# Patient Record
Sex: Female | Born: 1935 | Race: White | Hispanic: No | Marital: Married | Smoking: Former smoker
Health system: Southern US, Community
[De-identification: ages and names within clinical notes are randomized; demographics above are authoritative.]

## PROBLEM LIST (undated history)

## (undated) DIAGNOSIS — T4145XA Adverse effect of unspecified anesthetic, initial encounter: Secondary | ICD-10-CM

## (undated) DIAGNOSIS — Z7901 Long term (current) use of anticoagulants: Secondary | ICD-10-CM

## (undated) DIAGNOSIS — G629 Polyneuropathy, unspecified: Secondary | ICD-10-CM

## (undated) DIAGNOSIS — I10 Essential (primary) hypertension: Secondary | ICD-10-CM

## (undated) DIAGNOSIS — R270 Ataxia, unspecified: Secondary | ICD-10-CM

## (undated) DIAGNOSIS — H9190 Unspecified hearing loss, unspecified ear: Secondary | ICD-10-CM

## (undated) DIAGNOSIS — N181 Chronic kidney disease, stage 1: Secondary | ICD-10-CM

## (undated) DIAGNOSIS — IMO0001 Reserved for inherently not codable concepts without codable children: Secondary | ICD-10-CM

## (undated) DIAGNOSIS — C50919 Malignant neoplasm of unspecified site of unspecified female breast: Secondary | ICD-10-CM

## (undated) DIAGNOSIS — R112 Nausea with vomiting, unspecified: Secondary | ICD-10-CM

## (undated) DIAGNOSIS — Z9889 Other specified postprocedural states: Secondary | ICD-10-CM

## (undated) DIAGNOSIS — M1990 Unspecified osteoarthritis, unspecified site: Secondary | ICD-10-CM

## (undated) DIAGNOSIS — H3589 Other specified retinal disorders: Secondary | ICD-10-CM

## (undated) DIAGNOSIS — I495 Sick sinus syndrome: Secondary | ICD-10-CM

## (undated) DIAGNOSIS — I4821 Permanent atrial fibrillation: Secondary | ICD-10-CM

## (undated) DIAGNOSIS — Z8679 Personal history of other diseases of the circulatory system: Secondary | ICD-10-CM

## (undated) DIAGNOSIS — T8859XA Other complications of anesthesia, initial encounter: Secondary | ICD-10-CM

## (undated) DIAGNOSIS — H353190 Nonexudative age-related macular degeneration, unspecified eye, stage unspecified: Secondary | ICD-10-CM

## (undated) HISTORY — DX: Other specified retinal disorders: H35.89

## (undated) HISTORY — DX: Unspecified hearing loss, unspecified ear: H91.90

## (undated) HISTORY — DX: Chronic kidney disease, stage 1: N18.1

## (undated) HISTORY — PX: MIDDLE EAR SURGERY: SHX713

## (undated) HISTORY — DX: Reserved for inherently not codable concepts without codable children: IMO0001

## (undated) HISTORY — DX: Personal history of other diseases of the circulatory system: Z86.79

## (undated) HISTORY — PX: DILATION AND CURETTAGE OF UTERUS: SHX78

## (undated) HISTORY — DX: Nonexudative age-related macular degeneration, unspecified eye, stage unspecified: H35.3190

## (undated) HISTORY — DX: Unspecified osteoarthritis, unspecified site: M19.90

## (undated) HISTORY — DX: Permanent atrial fibrillation: I48.21

## (undated) HISTORY — DX: Malignant neoplasm of unspecified site of unspecified female breast: C50.919

## (undated) HISTORY — DX: Essential (primary) hypertension: I10

## (undated) HISTORY — PX: TONSILLECTOMY: SUR1361

## (undated) HISTORY — DX: Sick sinus syndrome: I49.5

## (undated) HISTORY — PX: APPENDECTOMY: SHX54

## (undated) HISTORY — DX: Long term (current) use of anticoagulants: Z79.01

## (undated) HISTORY — PX: PORT-A-CATH REMOVAL: SHX5289

---

## 1999-01-01 ENCOUNTER — Other Ambulatory Visit: Admission: RE | Admit: 1999-01-01 | Discharge: 1999-01-01 | Payer: Self-pay | Admitting: Obstetrics and Gynecology

## 1999-11-25 DIAGNOSIS — Z8679 Personal history of other diseases of the circulatory system: Secondary | ICD-10-CM

## 1999-11-25 HISTORY — DX: Personal history of other diseases of the circulatory system: Z86.79

## 2000-03-17 ENCOUNTER — Other Ambulatory Visit: Admission: RE | Admit: 2000-03-17 | Discharge: 2000-03-17 | Payer: Self-pay | Admitting: Obstetrics and Gynecology

## 2000-03-18 ENCOUNTER — Ambulatory Visit (HOSPITAL_COMMUNITY): Admission: RE | Admit: 2000-03-18 | Discharge: 2000-03-18 | Payer: Self-pay | Admitting: *Deleted

## 2001-03-22 ENCOUNTER — Other Ambulatory Visit: Admission: RE | Admit: 2001-03-22 | Discharge: 2001-03-22 | Payer: Self-pay | Admitting: Obstetrics and Gynecology

## 2002-03-11 ENCOUNTER — Ambulatory Visit (HOSPITAL_COMMUNITY): Admission: RE | Admit: 2002-03-11 | Discharge: 2002-03-11 | Payer: Self-pay | Admitting: Specialist

## 2002-03-11 ENCOUNTER — Encounter: Payer: Self-pay | Admitting: Specialist

## 2002-12-13 ENCOUNTER — Encounter: Payer: Self-pay | Admitting: Otolaryngology

## 2002-12-13 ENCOUNTER — Ambulatory Visit (HOSPITAL_COMMUNITY): Admission: RE | Admit: 2002-12-13 | Discharge: 2002-12-13 | Payer: Self-pay | Admitting: Otolaryngology

## 2003-09-12 ENCOUNTER — Ambulatory Visit (HOSPITAL_COMMUNITY): Admission: RE | Admit: 2003-09-12 | Discharge: 2003-09-12 | Payer: Self-pay | Admitting: Internal Medicine

## 2003-12-04 ENCOUNTER — Other Ambulatory Visit: Admission: RE | Admit: 2003-12-04 | Discharge: 2003-12-04 | Payer: Self-pay | Admitting: Obstetrics and Gynecology

## 2004-09-16 ENCOUNTER — Ambulatory Visit (HOSPITAL_COMMUNITY): Admission: RE | Admit: 2004-09-16 | Discharge: 2004-09-16 | Payer: Self-pay | Admitting: Specialist

## 2005-02-03 ENCOUNTER — Ambulatory Visit (HOSPITAL_COMMUNITY): Admission: RE | Admit: 2005-02-03 | Discharge: 2005-02-03 | Payer: Self-pay | Admitting: Specialist

## 2005-04-29 ENCOUNTER — Ambulatory Visit: Payer: Self-pay | Admitting: Cardiology

## 2005-05-16 ENCOUNTER — Ambulatory Visit: Payer: Self-pay | Admitting: *Deleted

## 2005-05-23 ENCOUNTER — Ambulatory Visit: Payer: Self-pay | Admitting: Cardiovascular Disease

## 2005-05-29 ENCOUNTER — Ambulatory Visit: Payer: Self-pay | Admitting: *Deleted

## 2005-06-02 ENCOUNTER — Ambulatory Visit: Payer: Self-pay | Admitting: Cardiology

## 2005-06-10 ENCOUNTER — Ambulatory Visit: Payer: Self-pay | Admitting: *Deleted

## 2005-06-16 ENCOUNTER — Ambulatory Visit: Payer: Self-pay | Admitting: Cardiology

## 2005-07-07 ENCOUNTER — Observation Stay (HOSPITAL_COMMUNITY): Admission: RE | Admit: 2005-07-07 | Discharge: 2005-07-07 | Payer: Self-pay | Admitting: Cardiology

## 2005-07-07 ENCOUNTER — Ambulatory Visit: Payer: Self-pay | Admitting: Cardiology

## 2005-07-11 ENCOUNTER — Ambulatory Visit: Payer: Self-pay | Admitting: Cardiology

## 2005-08-07 ENCOUNTER — Ambulatory Visit: Payer: Self-pay | Admitting: Cardiology

## 2005-09-10 ENCOUNTER — Ambulatory Visit: Payer: Self-pay | Admitting: *Deleted

## 2005-10-13 ENCOUNTER — Ambulatory Visit: Payer: Self-pay | Admitting: Cardiology

## 2005-11-11 ENCOUNTER — Ambulatory Visit: Payer: Self-pay | Admitting: Cardiology

## 2005-12-18 ENCOUNTER — Ambulatory Visit: Payer: Self-pay | Admitting: Cardiology

## 2006-01-09 ENCOUNTER — Ambulatory Visit: Payer: Self-pay | Admitting: Cardiology

## 2006-01-29 ENCOUNTER — Ambulatory Visit: Payer: Self-pay | Admitting: Cardiology

## 2006-02-04 ENCOUNTER — Ambulatory Visit: Payer: Self-pay | Admitting: Cardiology

## 2006-02-18 ENCOUNTER — Ambulatory Visit: Payer: Self-pay | Admitting: Cardiology

## 2006-02-18 ENCOUNTER — Observation Stay (HOSPITAL_COMMUNITY): Admission: AD | Admit: 2006-02-18 | Discharge: 2006-02-18 | Payer: Self-pay | Admitting: Cardiology

## 2006-02-20 ENCOUNTER — Ambulatory Visit: Payer: Self-pay | Admitting: Cardiology

## 2006-02-26 ENCOUNTER — Ambulatory Visit: Payer: Self-pay | Admitting: *Deleted

## 2006-03-05 ENCOUNTER — Ambulatory Visit: Payer: Self-pay | Admitting: Cardiology

## 2006-03-19 ENCOUNTER — Ambulatory Visit: Payer: Self-pay | Admitting: Internal Medicine

## 2006-03-20 ENCOUNTER — Ambulatory Visit (HOSPITAL_COMMUNITY): Admission: RE | Admit: 2006-03-20 | Discharge: 2006-03-20 | Payer: Self-pay | Admitting: Internal Medicine

## 2006-04-06 ENCOUNTER — Ambulatory Visit: Payer: Self-pay | Admitting: Cardiology

## 2006-05-07 ENCOUNTER — Ambulatory Visit: Payer: Self-pay | Admitting: *Deleted

## 2006-05-19 ENCOUNTER — Ambulatory Visit: Payer: Self-pay | Admitting: Cardiology

## 2006-06-12 ENCOUNTER — Ambulatory Visit: Payer: Self-pay | Admitting: *Deleted

## 2006-07-02 ENCOUNTER — Ambulatory Visit: Payer: Self-pay | Admitting: Cardiology

## 2006-08-06 ENCOUNTER — Ambulatory Visit: Payer: Self-pay | Admitting: Cardiology

## 2006-09-08 ENCOUNTER — Ambulatory Visit: Payer: Self-pay | Admitting: Cardiology

## 2006-09-17 ENCOUNTER — Ambulatory Visit: Payer: Self-pay | Admitting: Cardiology

## 2006-10-26 ENCOUNTER — Ambulatory Visit: Payer: Self-pay | Admitting: Cardiology

## 2006-12-07 ENCOUNTER — Ambulatory Visit: Payer: Self-pay | Admitting: Cardiovascular Disease

## 2007-01-06 ENCOUNTER — Ambulatory Visit: Payer: Self-pay | Admitting: Cardiology

## 2007-01-29 ENCOUNTER — Ambulatory Visit: Payer: Self-pay | Admitting: Cardiology

## 2007-02-15 ENCOUNTER — Ambulatory Visit: Payer: Self-pay | Admitting: *Deleted

## 2007-02-18 ENCOUNTER — Ambulatory Visit (HOSPITAL_COMMUNITY): Admission: RE | Admit: 2007-02-18 | Discharge: 2007-02-18 | Payer: Self-pay | Admitting: *Deleted

## 2007-02-26 ENCOUNTER — Ambulatory Visit: Payer: Self-pay | Admitting: Cardiology

## 2007-03-17 ENCOUNTER — Ambulatory Visit: Payer: Self-pay | Admitting: Cardiology

## 2007-04-13 ENCOUNTER — Ambulatory Visit: Payer: Self-pay | Admitting: Cardiology

## 2007-04-27 ENCOUNTER — Ambulatory Visit: Payer: Self-pay | Admitting: Cardiology

## 2007-06-07 ENCOUNTER — Ambulatory Visit: Payer: Self-pay | Admitting: Cardiology

## 2007-06-07 ENCOUNTER — Ambulatory Visit (HOSPITAL_COMMUNITY): Admission: RE | Admit: 2007-06-07 | Discharge: 2007-06-07 | Payer: Self-pay | Admitting: Cardiology

## 2007-06-15 ENCOUNTER — Ambulatory Visit: Payer: Self-pay | Admitting: Cardiology

## 2007-06-21 ENCOUNTER — Ambulatory Visit: Payer: Self-pay | Admitting: Cardiology

## 2007-06-25 ENCOUNTER — Ambulatory Visit (HOSPITAL_COMMUNITY): Admission: RE | Admit: 2007-06-25 | Discharge: 2007-06-25 | Payer: Self-pay | Admitting: Pulmonary Disease

## 2007-07-20 ENCOUNTER — Ambulatory Visit: Payer: Self-pay | Admitting: Cardiology

## 2007-08-03 ENCOUNTER — Ambulatory Visit: Payer: Self-pay | Admitting: Cardiology

## 2007-09-20 ENCOUNTER — Ambulatory Visit: Payer: Self-pay | Admitting: Internal Medicine

## 2007-10-18 ENCOUNTER — Ambulatory Visit: Payer: Self-pay | Admitting: Cardiology

## 2007-10-25 ENCOUNTER — Ambulatory Visit (HOSPITAL_COMMUNITY): Admission: RE | Admit: 2007-10-25 | Discharge: 2007-10-25 | Payer: Self-pay | Admitting: Cardiology

## 2007-10-26 ENCOUNTER — Ambulatory Visit: Payer: Self-pay | Admitting: Cardiology

## 2007-11-25 DIAGNOSIS — C50919 Malignant neoplasm of unspecified site of unspecified female breast: Secondary | ICD-10-CM

## 2007-11-25 HISTORY — PX: MICRODISCECTOMY LUMBAR: SUR864

## 2007-11-25 HISTORY — PX: MASTECTOMY PARTIAL / LUMPECTOMY W/ AXILLARY LYMPHADENECTOMY: SUR852

## 2007-11-25 HISTORY — DX: Malignant neoplasm of unspecified site of unspecified female breast: C50.919

## 2007-12-10 ENCOUNTER — Ambulatory Visit: Payer: Self-pay | Admitting: Internal Medicine

## 2008-01-06 ENCOUNTER — Ambulatory Visit: Payer: Self-pay | Admitting: Cardiology

## 2008-02-02 ENCOUNTER — Ambulatory Visit: Payer: Self-pay | Admitting: Cardiology

## 2008-03-02 ENCOUNTER — Ambulatory Visit: Payer: Self-pay | Admitting: Cardiology

## 2008-04-21 ENCOUNTER — Ambulatory Visit: Payer: Self-pay | Admitting: Cardiology

## 2008-04-25 ENCOUNTER — Ambulatory Visit (HOSPITAL_COMMUNITY): Admission: RE | Admit: 2008-04-25 | Discharge: 2008-04-25 | Payer: Self-pay | Admitting: Obstetrics and Gynecology

## 2008-04-28 ENCOUNTER — Encounter (HOSPITAL_COMMUNITY): Admission: RE | Admit: 2008-04-28 | Discharge: 2008-05-28 | Payer: Self-pay | Admitting: Oncology

## 2008-04-28 ENCOUNTER — Ambulatory Visit (HOSPITAL_COMMUNITY): Payer: Self-pay | Admitting: Oncology

## 2008-05-02 ENCOUNTER — Ambulatory Visit (HOSPITAL_COMMUNITY): Admission: RE | Admit: 2008-05-02 | Discharge: 2008-05-02 | Payer: Self-pay | Admitting: Oncology

## 2008-05-05 ENCOUNTER — Ambulatory Visit (HOSPITAL_COMMUNITY): Admission: RE | Admit: 2008-05-05 | Discharge: 2008-05-05 | Payer: Self-pay | Admitting: Radiology

## 2008-05-17 ENCOUNTER — Ambulatory Visit (HOSPITAL_COMMUNITY): Admission: RE | Admit: 2008-05-17 | Discharge: 2008-05-17 | Payer: Self-pay | Admitting: Oncology

## 2008-05-17 ENCOUNTER — Encounter (HOSPITAL_COMMUNITY): Payer: Self-pay | Admitting: Oncology

## 2008-05-17 ENCOUNTER — Ambulatory Visit: Payer: Self-pay | Admitting: Cardiology

## 2008-05-19 ENCOUNTER — Ambulatory Visit: Payer: Self-pay | Admitting: Cardiovascular Disease

## 2008-05-22 ENCOUNTER — Ambulatory Visit (HOSPITAL_COMMUNITY): Admission: RE | Admit: 2008-05-22 | Discharge: 2008-05-22 | Payer: Self-pay | Admitting: Surgery

## 2008-05-22 ENCOUNTER — Ambulatory Visit: Payer: Self-pay | Admitting: Internal Medicine

## 2008-05-23 ENCOUNTER — Ambulatory Visit: Admission: RE | Admit: 2008-05-23 | Discharge: 2008-07-19 | Payer: Self-pay | Admitting: Radiation Oncology

## 2008-05-25 ENCOUNTER — Ambulatory Visit: Payer: Self-pay | Admitting: Cardiology

## 2008-06-01 ENCOUNTER — Ambulatory Visit: Payer: Self-pay | Admitting: Internal Medicine

## 2008-06-05 ENCOUNTER — Ambulatory Visit: Payer: Self-pay | Admitting: Cardiology

## 2008-06-07 ENCOUNTER — Ambulatory Visit: Payer: Self-pay | Admitting: Hematology

## 2008-06-12 ENCOUNTER — Ambulatory Visit: Payer: Self-pay | Admitting: Cardiology

## 2008-06-13 ENCOUNTER — Encounter (HOSPITAL_COMMUNITY): Admission: RE | Admit: 2008-06-13 | Discharge: 2008-07-13 | Payer: Self-pay | Admitting: Oncology

## 2008-06-13 ENCOUNTER — Ambulatory Visit (HOSPITAL_COMMUNITY): Payer: Self-pay | Admitting: Oncology

## 2008-06-19 ENCOUNTER — Ambulatory Visit: Payer: Self-pay | Admitting: Cardiology

## 2008-06-26 ENCOUNTER — Ambulatory Visit: Payer: Self-pay | Admitting: Cardiology

## 2008-06-30 ENCOUNTER — Ambulatory Visit (HOSPITAL_COMMUNITY): Admission: RE | Admit: 2008-06-30 | Discharge: 2008-06-30 | Payer: Self-pay | Admitting: Orthopaedic Surgery

## 2008-06-30 ENCOUNTER — Ambulatory Visit: Payer: Self-pay | Admitting: Cardiology

## 2008-07-07 ENCOUNTER — Ambulatory Visit: Payer: Self-pay | Admitting: Cardiology

## 2008-07-10 ENCOUNTER — Ambulatory Visit: Payer: Self-pay | Admitting: Cardiology

## 2008-07-14 ENCOUNTER — Encounter (HOSPITAL_COMMUNITY): Admission: RE | Admit: 2008-07-14 | Discharge: 2008-08-13 | Payer: Self-pay | Admitting: Oncology

## 2008-07-17 ENCOUNTER — Encounter: Admission: RE | Admit: 2008-07-17 | Discharge: 2008-07-17 | Payer: Self-pay | Admitting: Neurosurgery

## 2008-07-17 ENCOUNTER — Ambulatory Visit (HOSPITAL_COMMUNITY): Payer: Self-pay | Admitting: Oncology

## 2008-07-21 ENCOUNTER — Ambulatory Visit: Payer: Self-pay | Admitting: Cardiology

## 2008-07-24 ENCOUNTER — Ambulatory Visit: Payer: Self-pay | Admitting: Cardiology

## 2008-08-08 ENCOUNTER — Ambulatory Visit: Payer: Self-pay | Admitting: Cardiology

## 2008-08-08 ENCOUNTER — Encounter (HOSPITAL_COMMUNITY): Payer: Self-pay | Admitting: Oncology

## 2008-08-10 ENCOUNTER — Inpatient Hospital Stay (HOSPITAL_COMMUNITY): Admission: RE | Admit: 2008-08-10 | Discharge: 2008-08-10 | Payer: Self-pay | Admitting: Neurosurgery

## 2008-08-14 ENCOUNTER — Encounter (HOSPITAL_COMMUNITY): Admission: RE | Admit: 2008-08-14 | Discharge: 2008-09-13 | Payer: Self-pay | Admitting: Oncology

## 2008-08-15 ENCOUNTER — Ambulatory Visit (HOSPITAL_COMMUNITY): Payer: Self-pay | Admitting: Oncology

## 2008-09-12 ENCOUNTER — Ambulatory Visit: Payer: Self-pay | Admitting: Oncology

## 2008-09-12 ENCOUNTER — Ambulatory Visit: Payer: Self-pay | Admitting: Cardiology

## 2008-09-12 ENCOUNTER — Inpatient Hospital Stay (HOSPITAL_COMMUNITY): Admission: AD | Admit: 2008-09-12 | Discharge: 2008-09-15 | Payer: Self-pay

## 2008-09-13 ENCOUNTER — Ambulatory Visit: Payer: Self-pay | Admitting: Oncology

## 2008-09-13 ENCOUNTER — Encounter (INDEPENDENT_AMBULATORY_CARE_PROVIDER_SITE_OTHER): Payer: Self-pay | Admitting: Family Medicine

## 2008-09-18 ENCOUNTER — Ambulatory Visit: Payer: Self-pay | Admitting: Cardiology

## 2008-09-19 ENCOUNTER — Encounter (HOSPITAL_COMMUNITY): Admission: RE | Admit: 2008-09-19 | Discharge: 2008-10-19 | Payer: Self-pay | Admitting: Oncology

## 2008-09-22 ENCOUNTER — Ambulatory Visit: Payer: Self-pay | Admitting: Cardiology

## 2008-10-03 ENCOUNTER — Ambulatory Visit (HOSPITAL_COMMUNITY): Payer: Self-pay | Admitting: Oncology

## 2008-10-09 ENCOUNTER — Encounter (HOSPITAL_COMMUNITY): Payer: Self-pay | Admitting: Oncology

## 2008-10-09 ENCOUNTER — Ambulatory Visit: Payer: Self-pay | Admitting: Cardiology

## 2008-10-23 ENCOUNTER — Ambulatory Visit: Payer: Self-pay | Admitting: Cardiology

## 2008-10-23 ENCOUNTER — Ambulatory Visit (HOSPITAL_COMMUNITY): Admission: RE | Admit: 2008-10-23 | Discharge: 2008-10-23 | Payer: Self-pay | Admitting: Cardiology

## 2008-10-24 ENCOUNTER — Encounter (HOSPITAL_COMMUNITY): Admission: RE | Admit: 2008-10-24 | Discharge: 2008-11-23 | Payer: Self-pay | Admitting: Oncology

## 2008-10-30 ENCOUNTER — Ambulatory Visit: Payer: Self-pay | Admitting: Cardiology

## 2008-11-06 ENCOUNTER — Ambulatory Visit: Payer: Self-pay | Admitting: Cardiology

## 2008-11-13 ENCOUNTER — Ambulatory Visit: Payer: Self-pay | Admitting: Cardiology

## 2008-11-15 ENCOUNTER — Inpatient Hospital Stay (HOSPITAL_COMMUNITY): Admission: AD | Admit: 2008-11-15 | Discharge: 2008-11-20 | Payer: Self-pay

## 2008-11-15 ENCOUNTER — Ambulatory Visit: Payer: Self-pay | Admitting: Cardiology

## 2008-11-23 ENCOUNTER — Ambulatory Visit: Payer: Self-pay | Admitting: Cardiology

## 2008-11-23 ENCOUNTER — Encounter (HOSPITAL_COMMUNITY): Payer: Self-pay | Admitting: Oncology

## 2008-11-27 ENCOUNTER — Ambulatory Visit (HOSPITAL_COMMUNITY): Payer: Self-pay | Admitting: Oncology

## 2008-11-27 ENCOUNTER — Encounter (HOSPITAL_COMMUNITY): Admission: RE | Admit: 2008-11-27 | Discharge: 2008-12-27 | Payer: Self-pay | Admitting: Oncology

## 2008-11-27 ENCOUNTER — Ambulatory Visit: Payer: Self-pay | Admitting: Cardiology

## 2008-11-29 ENCOUNTER — Ambulatory Visit: Payer: Self-pay | Admitting: Cardiology

## 2008-12-04 ENCOUNTER — Ambulatory Visit: Payer: Self-pay | Admitting: Cardiology

## 2008-12-12 ENCOUNTER — Ambulatory Visit: Payer: Self-pay | Admitting: Cardiology

## 2008-12-13 ENCOUNTER — Ambulatory Visit: Payer: Self-pay | Admitting: Cardiology

## 2008-12-14 ENCOUNTER — Ambulatory Visit (HOSPITAL_COMMUNITY): Admission: RE | Admit: 2008-12-14 | Discharge: 2008-12-15 | Payer: Self-pay | Admitting: Surgery

## 2008-12-14 ENCOUNTER — Encounter (INDEPENDENT_AMBULATORY_CARE_PROVIDER_SITE_OTHER): Payer: Self-pay | Admitting: Surgery

## 2008-12-18 ENCOUNTER — Ambulatory Visit: Payer: Self-pay | Admitting: Cardiology

## 2008-12-21 ENCOUNTER — Ambulatory Visit: Payer: Self-pay | Admitting: Cardiology

## 2008-12-28 ENCOUNTER — Ambulatory Visit: Payer: Self-pay | Admitting: Cardiology

## 2008-12-29 ENCOUNTER — Ambulatory Visit: Admission: RE | Admit: 2008-12-29 | Discharge: 2009-03-15 | Payer: Self-pay | Admitting: Radiation Oncology

## 2009-01-02 ENCOUNTER — Ambulatory Visit: Payer: Self-pay | Admitting: Cardiology

## 2009-01-15 ENCOUNTER — Ambulatory Visit: Payer: Self-pay | Admitting: Cardiology

## 2009-01-16 ENCOUNTER — Ambulatory Visit: Payer: Self-pay | Admitting: Cardiology

## 2009-01-16 ENCOUNTER — Ambulatory Visit (HOSPITAL_COMMUNITY): Admission: RE | Admit: 2009-01-16 | Discharge: 2009-01-16 | Payer: Self-pay | Admitting: Cardiology

## 2009-01-22 ENCOUNTER — Ambulatory Visit: Payer: Self-pay | Admitting: Cardiology

## 2009-01-31 ENCOUNTER — Ambulatory Visit: Payer: Self-pay | Admitting: Cardiology

## 2009-02-05 ENCOUNTER — Ambulatory Visit: Payer: Self-pay | Admitting: Cardiology

## 2009-02-12 ENCOUNTER — Encounter (HOSPITAL_COMMUNITY): Admission: RE | Admit: 2009-02-12 | Discharge: 2009-03-14 | Payer: Self-pay | Admitting: Oncology

## 2009-02-12 ENCOUNTER — Ambulatory Visit (HOSPITAL_COMMUNITY): Payer: Self-pay | Admitting: Oncology

## 2009-02-20 ENCOUNTER — Ambulatory Visit: Payer: Self-pay | Admitting: Cardiology

## 2009-02-20 ENCOUNTER — Encounter (HOSPITAL_COMMUNITY): Payer: Self-pay | Admitting: Oncology

## 2009-02-26 ENCOUNTER — Ambulatory Visit: Payer: Self-pay | Admitting: Cardiology

## 2009-03-08 IMAGING — CR DG CHEST 2V
1 series · 1 of 1 positions shown · non-contrast
Comparison: 05/22/2008.

CLINICAL DATA: Breast cancer.  Shortness breath.

CHEST - 2 VIEW

[view not recorded]
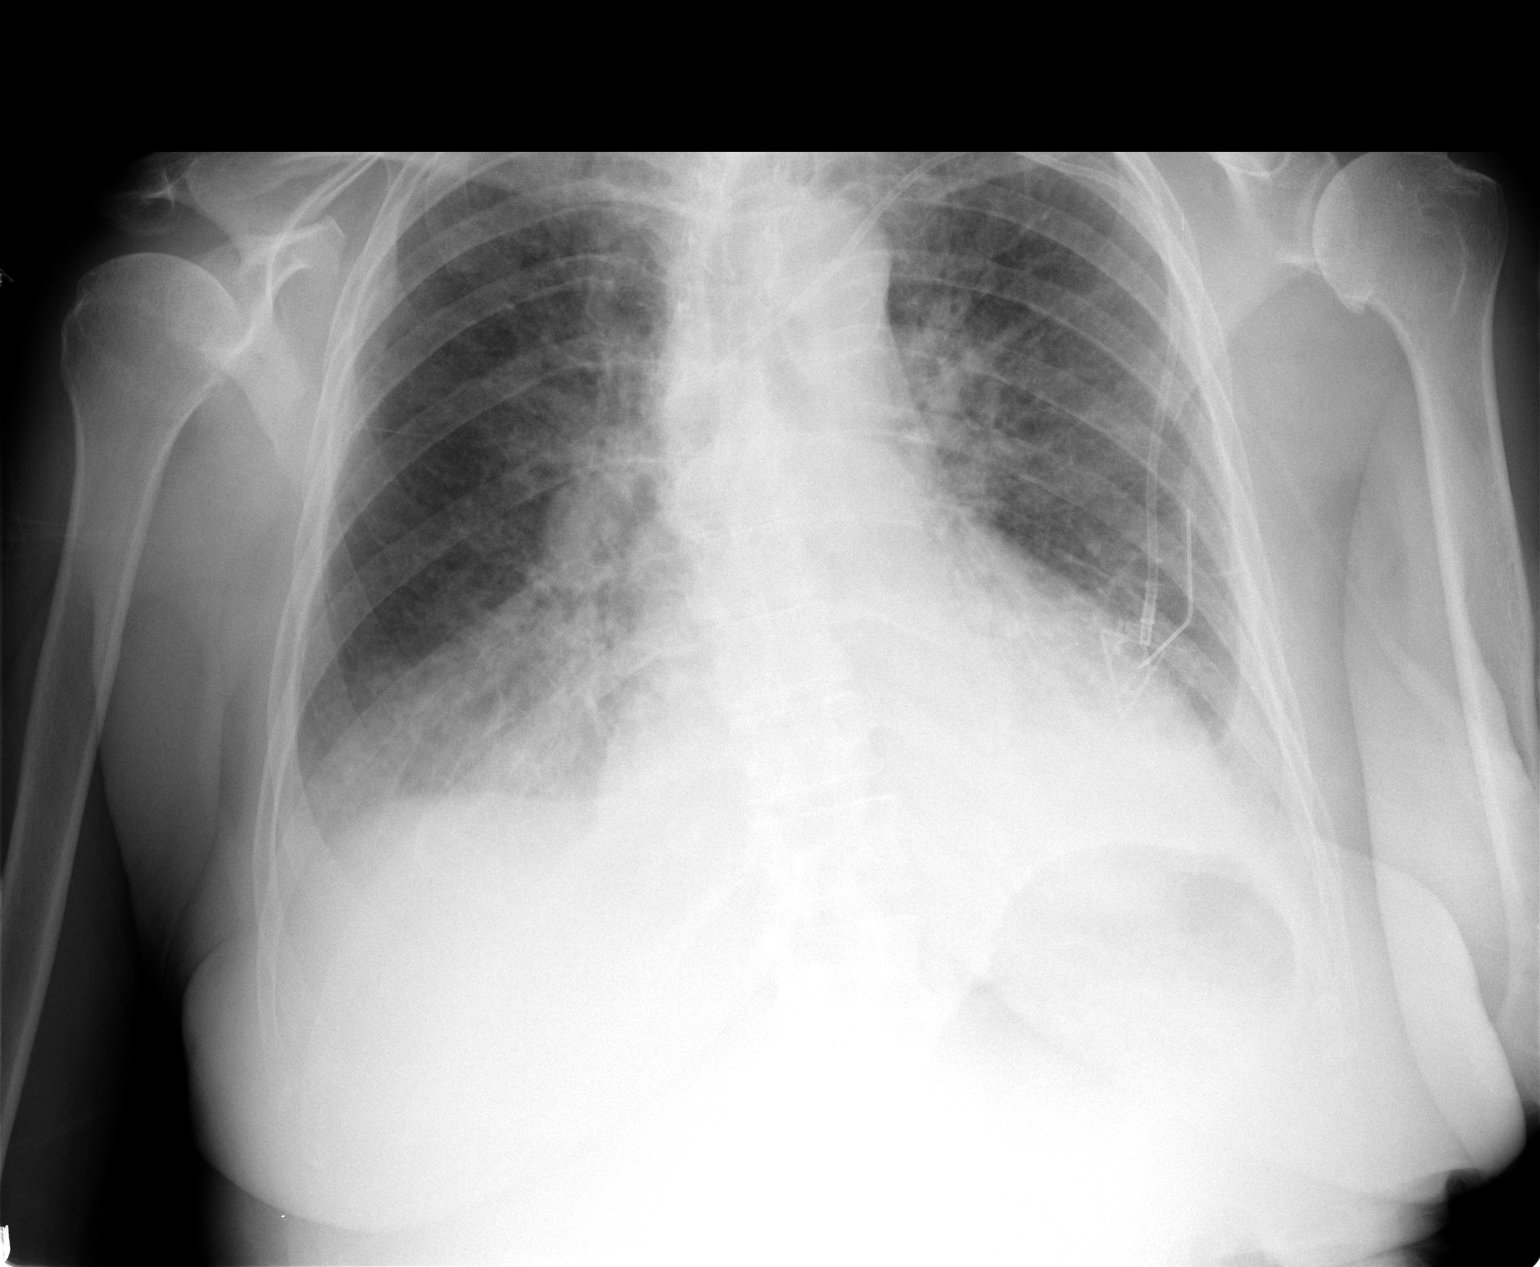

[1 of 1 positions shown; findings below may reference images not displayed]

FINDINGS: Cardiomegaly and pulmonary edema.  Pleural effusions may
be present.  Mediport catheter tip distal left subclavian vein at
the formation of the superior vena cava.  No pneumothorax.  No
segmental consolidation or plain film evidence pulmonary
malignancy.  The pulmonary edema limits evaluation for detection of
such.  Scoliosis and degenerative changes thoracic lumbar spine.
Calcified aorta.
IMPRESSION: Pulmonary edema with cardiomegaly and suggestion of
small bilateral pleural effusions.

## 2009-03-22 ENCOUNTER — Ambulatory Visit: Payer: Self-pay | Admitting: Cardiology

## 2009-04-26 ENCOUNTER — Ambulatory Visit: Payer: Self-pay | Admitting: Cardiology

## 2009-04-26 ENCOUNTER — Encounter (HOSPITAL_COMMUNITY): Admission: RE | Admit: 2009-04-26 | Discharge: 2009-05-26 | Payer: Self-pay | Admitting: Oncology

## 2009-04-27 ENCOUNTER — Ambulatory Visit (HOSPITAL_COMMUNITY): Payer: Self-pay | Admitting: Oncology

## 2009-05-15 IMAGING — CR DG CHEST 2V
2 series · 2 of 2 positions shown · non-contrast
Comparison: 11/15/2008

CLINICAL DATA: Neutropenia.  Fever.  Dehydration.

CHEST - 2 VIEW

[view not recorded (1 of 2)]
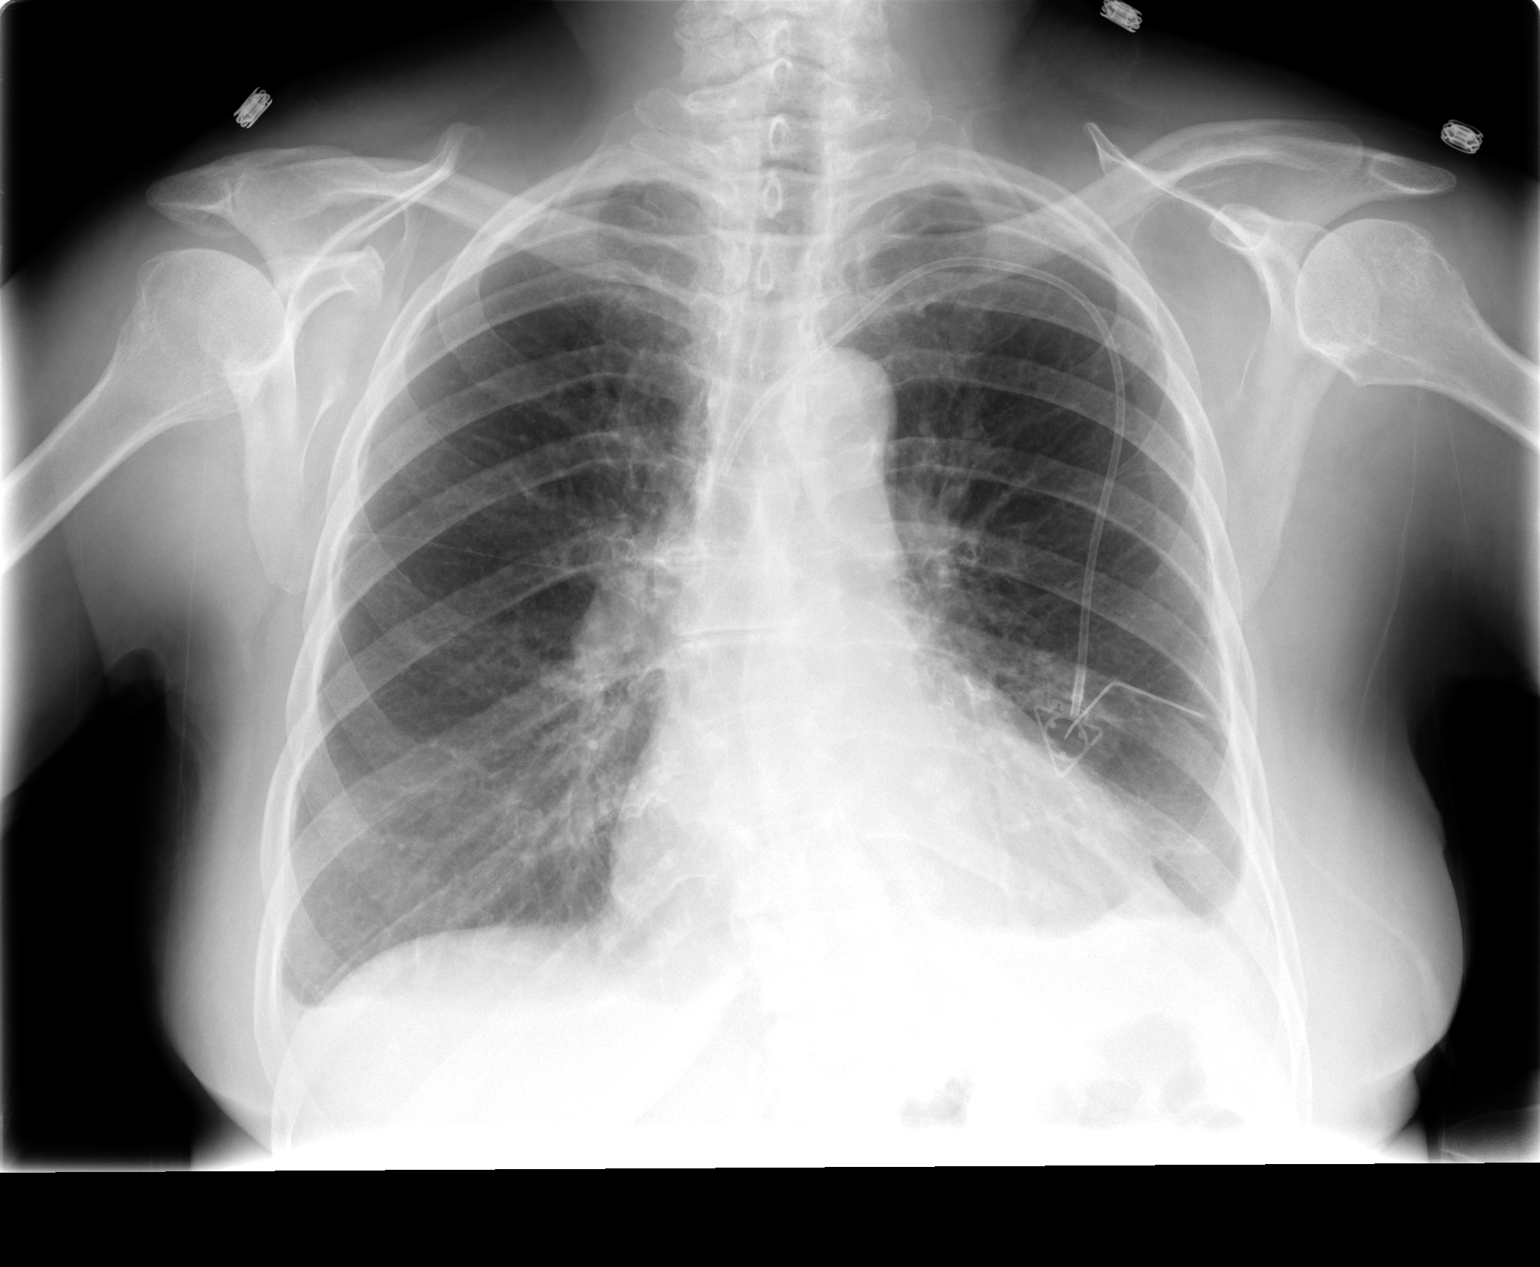

[view not recorded (2 of 2)]
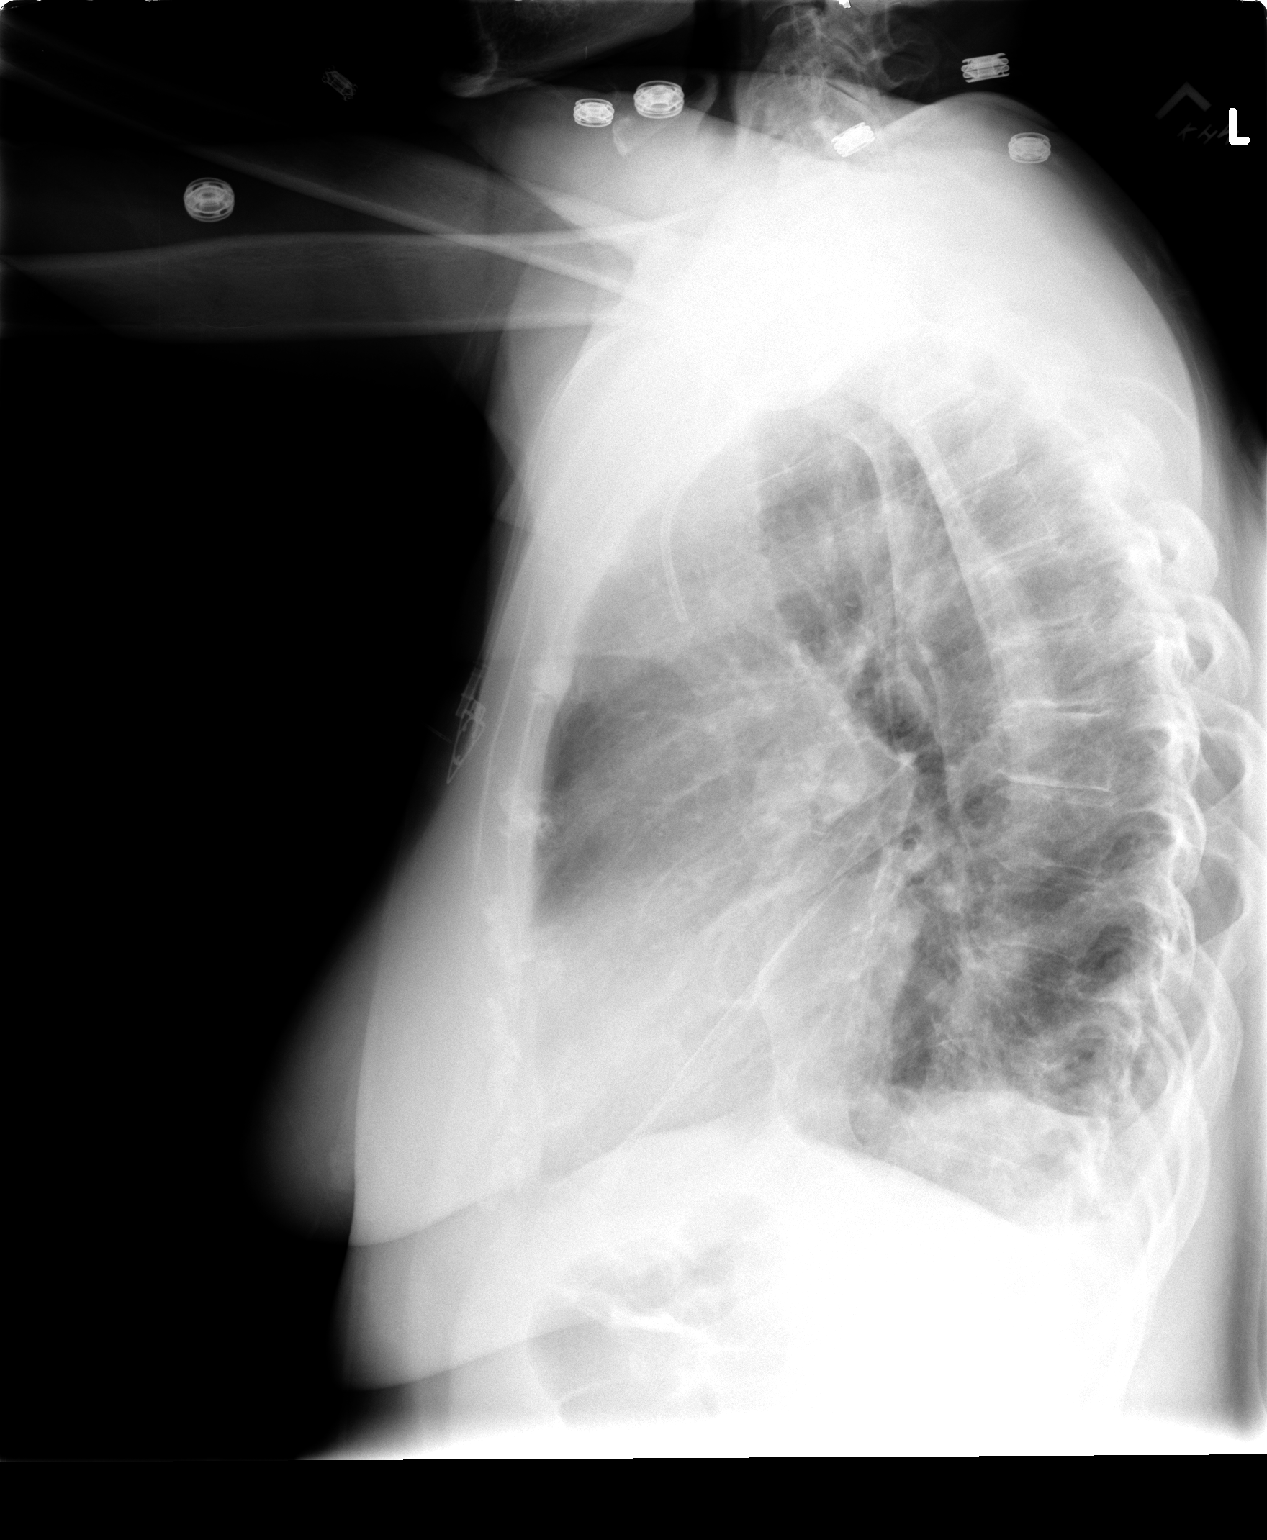

[2 of 2 positions shown; findings below may reference images not displayed]

FINDINGS: Small pleural effusions have developed.  No infiltrate.
Mild cardiomegaly.  Central venous catheter is in the upper SVC.
IMPRESSION: Small pleural effusions.

## 2009-06-01 DIAGNOSIS — I10 Essential (primary) hypertension: Secondary | ICD-10-CM | POA: Insufficient documentation

## 2009-06-01 DIAGNOSIS — H9190 Unspecified hearing loss, unspecified ear: Secondary | ICD-10-CM | POA: Insufficient documentation

## 2009-06-01 DIAGNOSIS — M25569 Pain in unspecified knee: Secondary | ICD-10-CM | POA: Insufficient documentation

## 2009-06-04 ENCOUNTER — Encounter: Payer: Self-pay | Admitting: Cardiology

## 2009-06-04 ENCOUNTER — Ambulatory Visit: Payer: Self-pay | Admitting: Cardiology

## 2009-06-20 ENCOUNTER — Encounter: Payer: Self-pay | Admitting: Cardiology

## 2009-06-20 ENCOUNTER — Ambulatory Visit (HOSPITAL_COMMUNITY): Payer: Self-pay | Admitting: Oncology

## 2009-06-20 LAB — CONVERTED CEMR LAB
Albumin: 4.1 g/dL (ref 3.5–5.2)
Alkaline Phosphatase: 85 units/L (ref 39–117)
Basophils Absolute: 0 10*3/uL (ref 0.0–0.1)
CO2: 25 meq/L (ref 19–32)
Calcium: 9.1 mg/dL (ref 8.4–10.5)
Chloride: 107 meq/L (ref 96–112)
Eosinophils Relative: 3 % (ref 0–5)
Glucose, Bld: 85 mg/dL (ref 70–99)
HCT: 35.7 % — ABNORMAL LOW (ref 36.0–46.0)
Hemoglobin: 11.8 g/dL — ABNORMAL LOW (ref 12.0–15.0)
Lymphs Abs: 0.7 10*3/uL (ref 0.7–4.0)
MCHC: 33.1 g/dL (ref 30.0–36.0)
Neutro Abs: 2.4 10*3/uL (ref 1.7–7.7)
Platelets: 159 10*3/uL (ref 150–400)
Potassium: 4 meq/L (ref 3.5–5.3)
Total Bilirubin: 0.4 mg/dL (ref 0.3–1.2)
WBC: 3.7 10*3/uL — ABNORMAL LOW (ref 4.0–10.5)

## 2009-06-21 ENCOUNTER — Ambulatory Visit: Payer: Self-pay | Admitting: Cardiology

## 2009-06-26 ENCOUNTER — Encounter: Payer: Self-pay | Admitting: Cardiology

## 2009-07-09 ENCOUNTER — Encounter: Payer: Self-pay | Admitting: *Deleted

## 2009-07-09 ENCOUNTER — Ambulatory Visit: Payer: Self-pay | Admitting: Cardiology

## 2009-07-09 LAB — CONVERTED CEMR LAB: POC INR: 1.5

## 2009-07-19 ENCOUNTER — Ambulatory Visit (HOSPITAL_BASED_OUTPATIENT_CLINIC_OR_DEPARTMENT_OTHER): Admission: RE | Admit: 2009-07-19 | Discharge: 2009-07-19 | Payer: Self-pay | Admitting: Surgery

## 2009-08-01 ENCOUNTER — Ambulatory Visit: Payer: Self-pay

## 2009-08-01 LAB — CONVERTED CEMR LAB: POC INR: 2.3

## 2009-08-21 ENCOUNTER — Encounter: Payer: Self-pay | Admitting: Cardiology

## 2009-08-28 ENCOUNTER — Encounter: Payer: Self-pay | Admitting: Cardiology

## 2009-08-29 ENCOUNTER — Encounter: Payer: Self-pay | Admitting: Cardiology

## 2009-08-29 ENCOUNTER — Telehealth: Payer: Self-pay | Admitting: Cardiology

## 2009-08-29 LAB — CONVERTED CEMR LAB: POC INR: 1.9

## 2009-09-21 ENCOUNTER — Encounter (INDEPENDENT_AMBULATORY_CARE_PROVIDER_SITE_OTHER): Payer: Self-pay | Admitting: *Deleted

## 2009-09-21 ENCOUNTER — Encounter: Payer: Self-pay | Admitting: Cardiology

## 2009-09-21 LAB — CONVERTED CEMR LAB
CO2: 27 meq/L
Chloride: 105 meq/L
Creatinine, Ser: 0.88 mg/dL
Glucose, Bld: 91 mg/dL (ref 70–99)
Potassium: 4.2 meq/L
Potassium: 4.2 meq/L (ref 3.5–5.3)
Sodium: 141 meq/L
Sodium: 141 meq/L (ref 135–145)

## 2009-09-24 ENCOUNTER — Encounter (INDEPENDENT_AMBULATORY_CARE_PROVIDER_SITE_OTHER): Payer: Self-pay | Admitting: *Deleted

## 2009-09-26 ENCOUNTER — Telehealth (INDEPENDENT_AMBULATORY_CARE_PROVIDER_SITE_OTHER): Payer: Self-pay

## 2009-09-26 ENCOUNTER — Ambulatory Visit: Payer: Self-pay | Admitting: Cardiology

## 2009-09-26 LAB — CONVERTED CEMR LAB: POC INR: 2.3

## 2009-09-27 ENCOUNTER — Encounter: Payer: Self-pay | Admitting: Cardiology

## 2009-10-05 ENCOUNTER — Encounter (INDEPENDENT_AMBULATORY_CARE_PROVIDER_SITE_OTHER): Payer: Self-pay | Admitting: *Deleted

## 2009-10-05 LAB — CONVERTED CEMR LAB
OCCULT 1: NEGATIVE
OCCULT 3: NEGATIVE

## 2009-10-17 ENCOUNTER — Encounter (HOSPITAL_COMMUNITY): Admission: RE | Admit: 2009-10-17 | Discharge: 2009-11-16 | Payer: Self-pay | Admitting: Oncology

## 2009-10-17 ENCOUNTER — Ambulatory Visit (HOSPITAL_COMMUNITY): Payer: Self-pay | Admitting: Oncology

## 2009-10-25 ENCOUNTER — Ambulatory Visit: Payer: Self-pay | Admitting: Cardiology

## 2009-11-05 ENCOUNTER — Encounter: Payer: Self-pay | Admitting: Cardiology

## 2009-11-22 ENCOUNTER — Ambulatory Visit: Payer: Self-pay | Admitting: Cardiology

## 2009-11-22 LAB — CONVERTED CEMR LAB: POC INR: 1.6

## 2009-12-20 ENCOUNTER — Encounter (INDEPENDENT_AMBULATORY_CARE_PROVIDER_SITE_OTHER): Payer: Self-pay | Admitting: *Deleted

## 2009-12-27 ENCOUNTER — Ambulatory Visit: Payer: Self-pay | Admitting: Cardiology

## 2009-12-27 ENCOUNTER — Encounter (INDEPENDENT_AMBULATORY_CARE_PROVIDER_SITE_OTHER): Payer: Self-pay | Admitting: *Deleted

## 2009-12-27 ENCOUNTER — Ambulatory Visit (HOSPITAL_COMMUNITY): Admission: RE | Admit: 2009-12-27 | Discharge: 2009-12-27 | Payer: Self-pay | Admitting: Cardiology

## 2009-12-27 LAB — CONVERTED CEMR LAB
ALT: 24 units/L
AST: 28 units/L
BUN: 20 mg/dL
Calcium: 8.9 mg/dL
Creatinine, Ser: 1.12 mg/dL
Potassium: 3.9 meq/L
Total Protein: 6 g/dL

## 2009-12-28 ENCOUNTER — Encounter (INDEPENDENT_AMBULATORY_CARE_PROVIDER_SITE_OTHER): Payer: Self-pay | Admitting: *Deleted

## 2009-12-28 LAB — CONVERTED CEMR LAB
Albumin: 3.9 g/dL (ref 3.5–5.2)
Alkaline Phosphatase: 69 units/L (ref 39–117)
BUN: 20 mg/dL (ref 6–23)
CO2: 26 meq/L (ref 19–32)
Calcium: 8.9 mg/dL (ref 8.4–10.5)
Chloride: 108 meq/L (ref 96–112)
Glucose, Bld: 102 mg/dL — ABNORMAL HIGH (ref 70–99)
Potassium: 3.9 meq/L (ref 3.5–5.3)
Sodium: 143 meq/L (ref 135–145)
Total Protein: 6 g/dL (ref 6.0–8.3)

## 2010-01-24 ENCOUNTER — Ambulatory Visit: Payer: Self-pay | Admitting: Cardiology

## 2010-01-24 LAB — CONVERTED CEMR LAB: POC INR: 2.4

## 2010-02-25 ENCOUNTER — Ambulatory Visit: Payer: Self-pay | Admitting: Cardiology

## 2010-02-25 LAB — CONVERTED CEMR LAB: POC INR: 1.8

## 2010-03-04 ENCOUNTER — Telehealth (INDEPENDENT_AMBULATORY_CARE_PROVIDER_SITE_OTHER): Payer: Self-pay

## 2010-03-11 ENCOUNTER — Ambulatory Visit: Payer: Self-pay | Admitting: Cardiovascular Disease

## 2010-03-22 ENCOUNTER — Telehealth: Payer: Self-pay | Admitting: Cardiology

## 2010-03-22 ENCOUNTER — Encounter: Payer: Self-pay | Admitting: Cardiology

## 2010-03-22 LAB — CONVERTED CEMR LAB: INR: 2.6

## 2010-04-10 ENCOUNTER — Ambulatory Visit: Payer: Self-pay | Admitting: Cardiology

## 2010-04-29 ENCOUNTER — Ambulatory Visit (HOSPITAL_COMMUNITY): Payer: Self-pay | Admitting: Oncology

## 2010-04-29 ENCOUNTER — Encounter (HOSPITAL_COMMUNITY)
Admission: RE | Admit: 2010-04-29 | Discharge: 2010-05-29 | Payer: Self-pay | Source: Home / Self Care | Admitting: Oncology

## 2010-04-29 ENCOUNTER — Ambulatory Visit: Payer: Self-pay | Admitting: Cardiology

## 2010-05-30 ENCOUNTER — Telehealth: Payer: Self-pay | Admitting: Cardiology

## 2010-05-30 ENCOUNTER — Encounter: Payer: Self-pay | Admitting: Cardiology

## 2010-05-30 LAB — CONVERTED CEMR LAB: POC INR: 2.8

## 2010-06-07 ENCOUNTER — Encounter (HOSPITAL_COMMUNITY): Admission: RE | Admit: 2010-06-07 | Discharge: 2010-07-07 | Payer: Self-pay | Admitting: Oncology

## 2010-06-13 ENCOUNTER — Ambulatory Visit: Payer: Self-pay | Admitting: Cardiology

## 2010-06-13 LAB — CONVERTED CEMR LAB: POC INR: 2.6

## 2010-06-14 ENCOUNTER — Encounter: Payer: Self-pay | Admitting: Cardiology

## 2010-07-02 ENCOUNTER — Encounter: Payer: Self-pay | Admitting: Cardiology

## 2010-07-10 ENCOUNTER — Ambulatory Visit: Payer: Self-pay | Admitting: Cardiology

## 2010-07-10 LAB — CONVERTED CEMR LAB: POC INR: 2.7

## 2010-07-19 ENCOUNTER — Encounter (INDEPENDENT_AMBULATORY_CARE_PROVIDER_SITE_OTHER): Payer: Self-pay | Admitting: *Deleted

## 2010-07-23 ENCOUNTER — Ambulatory Visit: Payer: Self-pay | Admitting: Cardiology

## 2010-07-24 ENCOUNTER — Encounter: Payer: Self-pay | Admitting: *Deleted

## 2010-07-24 LAB — CONVERTED CEMR LAB
ALT: 14 units/L (ref 0–35)
AST: 21 units/L (ref 0–37)
Alkaline Phosphatase: 62 units/L
Alkaline Phosphatase: 62 units/L (ref 39–117)
BUN: 19 mg/dL
Basophils Absolute: 0 10*3/uL
Basophils Absolute: 0 10*3/uL (ref 0.0–0.1)
Basophils Relative: 1 % (ref 0–1)
CO2: 29 meq/L
Creatinine, Ser: 0.88 mg/dL
Eosinophils Absolute: 0.1 10*3/uL (ref 0.0–0.7)
Eosinophils Relative: 1 %
Eosinophils Relative: 1 % (ref 0–5)
Glucose, Bld: 75 mg/dL
HCT: 38 % (ref 36.0–46.0)
Lymphocytes Relative: 21 %
Lymphs Abs: 0.8 10*3/uL
MCV: 107 fL
MCV: 107 fL — ABNORMAL HIGH (ref 78.0–100.0)
Monocytes Absolute: 0.3 10*3/uL
Neutrophils Relative %: 70 % (ref 43–77)
Platelets: 141 10*3/uL — ABNORMAL LOW (ref 150–400)
RBC: 3.55 M/uL
RDW: 13.4 % (ref 11.5–15.5)
Sodium: 144 meq/L (ref 135–145)
Total Bilirubin: 0.5 mg/dL (ref 0.3–1.2)
Total Protein: 5.7 g/dL — ABNORMAL LOW (ref 6.0–8.3)
WBC: 4 10*3/uL

## 2010-07-30 ENCOUNTER — Encounter (INDEPENDENT_AMBULATORY_CARE_PROVIDER_SITE_OTHER): Payer: Self-pay | Admitting: *Deleted

## 2010-07-30 LAB — CONVERTED CEMR LAB: OCCULT 2: NEGATIVE

## 2010-08-01 ENCOUNTER — Ambulatory Visit: Payer: Self-pay | Admitting: Cardiology

## 2010-08-02 ENCOUNTER — Encounter (INDEPENDENT_AMBULATORY_CARE_PROVIDER_SITE_OTHER): Payer: Self-pay | Admitting: *Deleted

## 2010-08-02 ENCOUNTER — Encounter (INDEPENDENT_AMBULATORY_CARE_PROVIDER_SITE_OTHER): Payer: Self-pay

## 2010-08-07 ENCOUNTER — Ambulatory Visit: Payer: Self-pay | Admitting: Cardiology

## 2010-09-12 ENCOUNTER — Ambulatory Visit: Payer: Self-pay | Admitting: Cardiology

## 2010-09-12 LAB — CONVERTED CEMR LAB: POC INR: 2.5

## 2010-10-10 ENCOUNTER — Ambulatory Visit: Payer: Self-pay

## 2010-10-10 LAB — CONVERTED CEMR LAB: POC INR: 2.8

## 2010-10-23 ENCOUNTER — Encounter (HOSPITAL_COMMUNITY)
Admission: RE | Admit: 2010-10-23 | Discharge: 2010-11-22 | Payer: Self-pay | Source: Home / Self Care | Attending: Oncology | Admitting: Oncology

## 2010-10-23 ENCOUNTER — Ambulatory Visit (HOSPITAL_COMMUNITY): Payer: Self-pay | Admitting: Oncology

## 2010-11-07 ENCOUNTER — Ambulatory Visit: Payer: Self-pay | Admitting: Cardiology

## 2010-11-07 LAB — CONVERTED CEMR LAB: POC INR: 2.9

## 2010-12-09 ENCOUNTER — Encounter: Payer: Self-pay | Admitting: Cardiology

## 2010-12-09 LAB — CONVERTED CEMR LAB: Prothrombin Time: 30.5 s

## 2010-12-11 ENCOUNTER — Encounter: Payer: Self-pay | Admitting: Cardiology

## 2010-12-12 ENCOUNTER — Encounter: Payer: Self-pay | Admitting: Cardiology

## 2010-12-15 ENCOUNTER — Encounter: Payer: Self-pay | Admitting: Neurosurgery

## 2010-12-15 ENCOUNTER — Encounter: Payer: Self-pay | Admitting: Radiology

## 2010-12-24 NOTE — Medication Information (Signed)
Summary: ccr-lr  Anticoagulant Therapy  Managed by: Vashti Hey, RN PCP: Dr. [place] Bing Supervising MD: Dietrich Pates MD, Molly Maduro Indication 1: Atrial Fibrillation Lab Used: [facility] [place] Site: [place] INR POC 1.8  Dietary changes: no    Health status changes: no    Bleeding/hemorrhagic complications: no    Recent/future hospitalizations: no    Any changes in medication regimen? no    Recent/future dental: no  Any missed doses?: no       Is patient compliant with meds? yes       Allergies: 1)  ! * Rampiril  Anticoagulation Management History:      The patient is taking warfarin and comes in today for a routine follow up visit.  Positive risk factors for bleeding include an age of 75 years or older.  The bleeding index is 'intermediate risk'.  Positive CHADS2 values include History of CHF, History of HTN, and Age > 56 years old.  The start date was 04/24/2005.  Anticoagulation responsible provider: Dietrich Pates MD, Molly Maduro.  INR POC: 1.8.  Cuvette Lot#: [id number].  Exp: 10/11.    Anticoagulation Management Assessment/Plan:      The patient's current anticoagulation dose is Coumadin 5 mg tabs: Marland Kitchen  The target INR is 2 - 3.  The next INR is due 03/11/2010.  Anticoagulation instructions were given to patient.  Results were reviewed/authorized by Vashti Hey, RN.  She was notified by Vashti Hey RN.         Prior Anticoagulation Instructions: INR 2.4 Continue coumadin 5mg  once daily except 7.5mg  on Sundays, Tuesdays and Thursdays  Current Anticoagulation Instructions: INR 1.8 Take coumadin 10mg  x 2 then resume 5mg  once daily except 7.5mg  on Sundays, Tuesdays and Thursdays

## 2010-12-24 NOTE — Medication Information (Signed)
Summary: ccr-lr  Anticoagulant Therapy  Managed by: Vashti Hey, RN PCP: Dr. [place] Bing Supervising MD: Daleen Squibb MD, Maisie Fus Indication 1: Atrial Fibrillation Lab Used: [facility] [place] Site: [place] INR POC 2.7  Dietary changes: no    Health status changes: no    Bleeding/hemorrhagic complications: no    Recent/future hospitalizations: no    Any changes in medication regimen? no    Recent/future dental: no  Any missed doses?: no       Is patient compliant with meds? yes       Allergies: 1)  ! * Rampiril  Anticoagulation Management History:      The patient is taking warfarin and comes in today for a routine follow up visit.  Positive risk factors for bleeding include an age of 36 years or older.  The bleeding index is 'intermediate risk'.  Positive CHADS2 values include History of CHF, History of HTN, and Age > 20 years old.  The start date was 04/24/2005.  Her last INR was 2.6.  Anticoagulation responsible provider: Daleen Squibb MD, Maisie Fus.  INR POC: 2.7.  Cuvette Lot#: [id number].  Exp: 10/11.    Anticoagulation Management Assessment/Plan:      The patient's current anticoagulation dose is Coumadin 5 mg tabs: Marland Kitchen  The target INR is 2 - 3.  The next INR is due 08/07/2010.  Anticoagulation instructions were given to patient.  Results were reviewed/authorized by Vashti Hey, RN.  She was notified by Vashti Hey RN.         Prior Anticoagulation Instructions: INR 2.6 Continue coumadin 7.5mg  once daily except 5mg  on Mondays and Fridays  Current Anticoagulation Instructions: INR 2.7 Continue coumadin 7.5mg  once daily except 5mg  on Mondays and Fridays

## 2010-12-24 NOTE — Medication Information (Signed)
Summary: ccr-lr  Anticoagulant Therapy  Managed by: Vashti Hey, RN PCP: Dr. [place] Bing Supervising MD: Dietrich Pates MD, Molly Maduro Indication 1: Atrial Fibrillation Lab Used: [facility] [place] Site: [place] INR POC 2.6  Dietary changes: no    Health status changes: no    Bleeding/hemorrhagic complications: no    Recent/future hospitalizations: no    Any changes in medication regimen? no    Recent/future dental: no  Any missed doses?: no       Is patient compliant with meds? yes       Allergies: 1)  ! * Rampiril  Anticoagulation Management History:      The patient is taking warfarin and comes in today for a routine follow up visit.  Positive risk factors for bleeding include an age of 77 years or older.  The bleeding index is 'intermediate risk'.  Positive CHADS2 values include History of CHF, History of HTN, and Age > 62 years old.  The start date was 04/24/2005.  Her last INR was 2.6.  Anticoagulation responsible provider: Dietrich Pates MD, Molly Maduro.  INR POC: 2.6.  Cuvette Lot#: [id number].  Exp: 10/11.    Anticoagulation Management Assessment/Plan:      The patient's current anticoagulation dose is Coumadin 5 mg tabs: Marland Kitchen  The target INR is 2 - 3.  The next INR is due 07/10/2010.  Anticoagulation instructions were given to patient.  Results were reviewed/authorized by Vashti Hey, RN.  She was notified by Vashti Hey RN.         Prior Anticoagulation Instructions: Pt is at [facility] and is staying a week longer than planned so she had INR checked yesterday locally.  INR 2.8  Pt told to continue coumadin 7.5mg  once daily except 5mg  on Mondays and Fridays.  Recheck INR on 06/13/10 when she returns home.  Current Anticoagulation Instructions: INR 2.6 Continue coumadin 7.5mg  once daily except 5mg  on Mondays and Fridays

## 2010-12-24 NOTE — Medication Information (Signed)
Summary: ccr-lr  Anticoagulant Therapy  Managed by: Vashti Hey, RN PCP: Dr. [place] Bing Supervising MD: Diona Browner MD, Remi Deter Indication 1: Atrial Fibrillation Lab Used: [facility] [place] Site: [place] INR POC 2.8  Dietary changes: no    Health status changes: no    Bleeding/hemorrhagic complications: no    Recent/future hospitalizations: no    Any changes in medication regimen? no    Recent/future dental: no  Any missed doses?: no       Is patient compliant with meds? yes       Allergies: 1)  ! * Rampiril  Anticoagulation Management History:      The patient is taking warfarin and comes in today for a routine follow up visit.  Positive risk factors for bleeding include an age of 39 years or older.  The bleeding index is 'intermediate risk'.  Positive CHADS2 values include History of CHF, History of HTN, and Age > 75 years old.  The start date was 04/24/2005.  Her last INR was 2.6.  Anticoagulation responsible Markeem Noreen: Diona Browner MD, Remi Deter.  INR POC: 2.8.  Cuvette Lot#: [id number].  Exp: 10/11.    Anticoagulation Management Assessment/Plan:      The patient's current anticoagulation dose is Coumadin 5 mg tabs: Marland Kitchen  The target INR is 2 - 3.  The next INR is due 11/07/2010.  Anticoagulation instructions were given to patient.  Results were reviewed/authorized by Vashti Hey, RN.  She was notified by Vashti Hey RN.         Prior Anticoagulation Instructions: INR 2.5 Continue coumadin 7.5mg  once daily except 5mg  on Mondays and Fridays  Current Anticoagulation Instructions: INR 2.8 Continue coumadin 7.5mg  once daily except 5mg  on Mondays and Fridays

## 2010-12-24 NOTE — Letter (Signed)
Summary: PROGRESS NOTE  PROGRESS NOTE   Imported By: Faythe Ghee 12/12/2009 12:20:06  _____________________________________________________________________  External Attachment:    Type:   Image     Comment:   External Document

## 2010-12-24 NOTE — Miscellaneous (Signed)
Summary: labs cmp,digoxin 12/27/2009(1.6)  Clinical Lists Changes  Observations: Added new observation of CALCIUM: 8.9 mg/dL (62/13/0865 78:46) Added new observation of ALBUMIN: 3.9 g/dL (96/29/5284 13:24) Added new observation of PROTEIN, TOT: 6.0 g/dL (40/08/2724 36:64) Added new observation of SGPT (ALT): 24 units/L (12/27/2009 15:24) Added new observation of SGOT (AST): 28 units/L (12/27/2009 15:24) Added new observation of ALK PHOS: 69 units/L (12/27/2009 15:24) Added new observation of CREATININE: 1.12 mg/dL (40/34/7425 95:63) Added new observation of BUN: 20 mg/dL (87/56/4332 95:18) Added new observation of BG RANDOM: 102 mg/dL (84/16/6063 01:60) Added new observation of CO2 PLSM/SER: 26 meq/L (12/27/2009 15:24) Added new observation of CL SERUM: 108 meq/L (12/27/2009 15:24) Added new observation of K SERUM: 3.9 meq/L (12/27/2009 15:24) Added new observation of NA: 143 meq/L (12/27/2009 15:24)

## 2010-12-24 NOTE — Letter (Signed)
Summary: External Correspondence  External Correspondence   Imported By: Dreama Saa, CNA 07/02/2010 14:17:57  _____________________________________________________________________  External Attachment:    Type:   Image     Comment:   External Document

## 2010-12-24 NOTE — Assessment & Plan Note (Signed)
Summary: 5 MTH F/U PER CHECKOUT ON 12/27/09/TG  Medications Added FUROSEMIDE 20 MG TABS (FUROSEMIDE) take as needed KLOR-CON M20 20 MEQ CR-TABS (POTASSIUM CHLORIDE CRYS CR) take as needed TAMOXIFEN CITRATE 20 MG TABS (TAMOXIFEN CITRATE) take 1 tab daily      Allergies Added:   Visit Type:  Follow-up Referring Provider:  Oncology-Dr. Glenford Peers Primary Provider:  Dr. [place] Bing   History of Present Illness: Ms. Alaiza Yau returns to the office as scheduled for continued assessment and treatment of atrial fibrillation and cardiomyopathy, possibly induced by chemotherapy.  She has continued to do well over the past 5 months, traveling extensively as usual without significant cardiopulmonary symptoms.  One trip involved [place] in the [place] and was remarkably well-tolerated.  She denies orthopnea, PND, pedal edema and chest discomfort.  She notes no palpitations.  She developed an episode of dizziness and incoordination that she describes as ataxia.  Evaluation, including brain imaging, failed to identify a specific cause.  Symptoms subsequently remitted after tamoxifen was temporarily discontinued.  Patient reports substantial emotional stress related to a custody dispute between her daughter and son-in-law involving her grandchild.  Current Medications (verified): 1)  Coumadin 5 Mg Tabs (Warfarin Sodium) 2)  Furosemide 20 Mg Tabs (Furosemide) .... Take As Needed 3)  Klor-Con M20 20 Meq Cr-Tabs (Potassium Chloride Crys Cr) .... Take As Needed 4)  Diovan 80 Mg Tabs (Valsartan) .... Take 1 Tab Daily 5)  Toprol Xl 25 Mg Xr24h-Tab (Metoprolol Succinate) .... Take One Tablet Daily 6)  Digoxin 0.25 Mg Tabs (Digoxin) .... One Tablet On Mwf, 1/2 Tablet All Other Days 7)  Tamoxifen Citrate 20 Mg Tabs (Tamoxifen Citrate) .... Take 1 Tab Daily  Allergies (verified): 1)  ! * Rampiril  Past History:  PMH, FH, and Social History reviewed and updated.  Review of  Systems       See history of present illness.  Vital Signs:  Patient profile:   77 year old female Weight:      115 pounds BMI:     22.54 Pulse rate:   62 / minute BP sitting:   119 / 59  (right arm)  Vitals Entered By: Dreama Saa, CNA (July 23, 2010 2:31 PM)  Physical Exam  General:  Trim; well developed; no acute distress;  Neck-No JVD; no carotid bruits: Lungs-No tachypnea, no rales normal rhonchi Cardiovascular-normal PMI; normal S1 and S2; irregular rhythm; minimal systolic murmur Abdomen-BS normal; soft and non-tender without masses or organomegaly:  Musculoskeletal-No deformities, no cyanosis or clubbing: Neurologic-Normal cranial nerves; symmetric strength and tone:  Skin-Warm, no significant lesions: Extremities-Nl distal pulses; no edema:     Impression & Recommendations:  Problem # 1:  CARDIOMYOPATHY (ICD-425.4) Avaline is doing extremely well with essentially no cardiopulmonary symptoms.  She is encouraged to continue to be active, to monitor weight and to call for any deterioration in her status.  Due to therapy with diuretic, a potassium supplement and an ARB, a chemistry profile will be checked.  Problem # 2:  ATRIAL FIBRILLATION (ICD-427.31) Heart rate is well controlled.  She does not appear to be experiencing adverse effects related to her arrhythmia.  We will continue to follow a rate control strategy plus anticoagulation.  Due to the latter, a CBC and stool for Hemoccult testing will be obtained.  Problem # 3:  HYPERTENSION (ICD-401.9) Blood pressure control is excellent.  Current therapy will be continued with a return visit planned in 7 months.  Other Orders: T-Comprehensive Metabolic  Panel [phone]) T-CBC w/Diff [phone])  Patient Instructions: 1)  Your physician recommends that you schedule a follow-up appointment in: 7 months 2)  Your physician recommends that you return for lab work in: once you

## 2010-12-24 NOTE — Miscellaneous (Signed)
  Clinical Lists Changes  Medications: Changed medication from LOPRESSOR 50 MG TABS (METOPROLOL TARTRATE) take 1`/2 tab daily to TOPROL XL 25 MG XR24H-TAB (METOPROLOL SUCCINATE) take one tablet daily

## 2010-12-24 NOTE — Progress Notes (Signed)
Summary: coumadin management  Phone Note Other Incoming   Caller: Bronson Ing Reason for Call: Discuss lab or test results Summary of Call: Pt had PT/INR obtained in [facility] 03/21/10.  Received fax this am.  PT 27.8  INR 2.6  Pt called to get results.  She was told to continue coumadin 7.5mg  once daily except 5mg  on Mondays and Fridays.  She verbalized understanding.  She already has f/u INR appt on 5/18 when she returns home. Initial call taken by: Vashti Hey RN,  March 22, 2010 10:47 AM     Anticoagulant Therapy  Managed by: Vashti Hey, RN PCP: Dr. [place] Bing Supervising MD: Diona Browner MD, Remi Deter Indication 1: Atrial Fibrillation Lab Used: [facility] [place] Site: [place] PT 27.8  Dietary changes: no    Health status changes: no    Bleeding/hemorrhagic complications: no    Recent/future hospitalizations: no    Any changes in medication regimen? no    Recent/future dental: no  Any missed doses?: no       Is patient compliant with meds? yes         Anticoagulation Management History:      Her anticoagulation is being managed by telephone today.  Positive risk factors for bleeding include an age of 5 years or older.  The bleeding index is 'intermediate risk'.  Positive CHADS2 values include History of CHF, History of HTN, and Age > 29 years old.  The start date was 04/24/2005.  Today's INR is 2.6.  Prothrombin time is 27.8.  Anticoagulation responsible provider: Diona Browner MD, Remi Deter.  INR POC: 1.7.  Exp: 10/11.    Anticoagulation Management Assessment/Plan:      The patient's current anticoagulation dose is Coumadin 5 mg tabs: Marland Kitchen  The target INR is 2 - 3.  The next INR is due 04/10/2010.  Anticoagulation instructions were given to patient.  Results were reviewed/authorized by Vashti Hey, RN.  She was notified by Vashti Hey RN.         Prior Anticoagulation Instructions: INR 1.7 Take coumadin 10mg  tonight and tomorrow night then  increase dose to 7.5mg  once daily except 5mg  on Mondays and Fridays Pt leaving today for [place] x 4 weeks.  Order given to pt to have INR checked there week of 03/25/10 with results faxed to me.  Current Anticoagulation Instructions: Pt had PT/INR obtained in [place] 03/21/10.  Received fax this am.  PT 27.8  INR 2.6  Pt called to get results.  She was told to continue coumadin 7.5mg  once daily except 5mg  on Mondays and Fridays.  She verbalized understanding.  She already has f/u INR appt on 5/18 when she returns home.

## 2010-12-24 NOTE — Progress Notes (Signed)
Summary: coumadin management  Phone Note Call from Patient   Caller: Patient Call For: Vashti Hey RN Reason for Call: Talk to Nurse Summary of Call: Pt is at [facility] and is staying a week longer than planned so she had INR checked yesterday locally.  INR 2.8  Pt told to continue coumadin 7.5mg  once daily except 5mg  on Mondays and Fridays.  Recheck INR on 06/13/10 when she returns home. Initial call taken by: Vashti Hey RN,  May 30, 2010 10:56 AM     Anticoagulant Therapy  Managed by: Vashti Hey, RN PCP: Dr. [place] Bing Supervising MD: Diona Browner MD, Remi Deter Indication 1: Atrial Fibrillation Lab Used: [facility] [place] Site: [place] INR POC 2.8  Dietary changes: no    Health status changes: no    Bleeding/hemorrhagic complications: no    Recent/future hospitalizations: no    Any changes in medication regimen? no    Recent/future dental: no  Any missed doses?: no       Is patient compliant with meds? yes         Anticoagulation Management History:      Her anticoagulation is being managed by telephone today.  Positive risk factors for bleeding include an age of 65 years or older.  The bleeding index is 'intermediate risk'.  Positive CHADS2 values include History of CHF, History of HTN, and Age > 15 years old.  The start date was 04/24/2005.  Her last INR was 2.6.  Anticoagulation responsible provider: Diona Browner MD, Remi Deter.  INR POC: 2.8.  Exp: 10/11.    Anticoagulation Management Assessment/Plan:      The patient's current anticoagulation dose is Coumadin 5 mg tabs: Marland Kitchen  The target INR is 2 - 3.  The next INR is due 06/13/2010.  Anticoagulation instructions were given to patient.  Results were reviewed/authorized by Vashti Hey, RN.  She was notified by Vashti Hey RN.         Prior Anticoagulation Instructions: INR 3.1 Hold coumadin tonight then resume 7.5mg  once daily except 5mg  on Mondays and Fridays Leaving for [employer] [place]  05-03-10.  Will be gone 4-5 weeks.  Current Anticoagulation Instructions: Pt is at [facility] and is staying a week longer than planned so she had INR checked yesterday locally.  INR 2.8  Pt told to continue coumadin 7.5mg  once daily except 5mg  on Mondays and Fridays.  Recheck INR on 06/13/10 when she returns home.

## 2010-12-24 NOTE — Miscellaneous (Signed)
Summary: labs bmp 09/21/2009  Clinical Lists Changes  Observations: Added new observation of CALCIUM: 9.3 mg/dL (16/08/9603 54:09) Added new observation of CREATININE: 0.88 mg/dL (81/19/1478 29:56) Added new observation of BUN: 20 mg/dL (21/30/8657 84:69) Added new observation of BG RANDOM: 91 mg/dL (62/95/2841 32:44) Added new observation of CO2 PLSM/SER: 27 meq/L (09/21/2009 15:29) Added new observation of CL SERUM: 105 meq/L (09/21/2009 15:29) Added new observation of K SERUM: 4.2 meq/L (09/21/2009 15:29) Added new observation of NA: 141 meq/L (09/21/2009 15:29)

## 2010-12-24 NOTE — Medication Information (Signed)
Summary: ccr-lr  Anticoagulant Therapy  Managed by: Vashti Hey, RN PCP: Dr. [place] Bing Supervising MD: Diona Browner MD, Remi Deter Indication 1: Atrial Fibrillation Lab Used: [facility] [place] Site: [place] INR POC 3.3  Dietary changes: no    Health status changes: no    Bleeding/hemorrhagic complications: no    Recent/future hospitalizations: no    Any changes in medication regimen? no    Recent/future dental: no  Any missed doses?: no       Is patient compliant with meds? yes       Allergies: 1)  ! * Rampiril  Anticoagulation Management History:      The patient is taking warfarin and comes in today for a routine follow up visit.  Positive risk factors for bleeding include an age of 75 years or older.  The bleeding index is 'intermediate risk'.  Positive CHADS2 values include History of CHF, History of HTN, and Age > 100 years old.  The start date was 04/24/2005.  Her last INR was 2.6.  Anticoagulation responsible provider: Diona Browner MD, Remi Deter.  INR POC: 3.3.  Cuvette Lot#: [id number].  Exp: 10/11.    Anticoagulation Management Assessment/Plan:      The patient's current anticoagulation dose is Coumadin 5 mg tabs: Marland Kitchen  The target INR is 2 - 3.  The next INR is due 09/12/2010.  Anticoagulation instructions were given to patient.  Results were reviewed/authorized by Vashti Hey, RN.  She was notified by Vashti Hey RN.         Prior Anticoagulation Instructions: INR 2.7 Continue coumadin 7.5mg  once daily except 5mg  on Mondays and Fridays  Current Anticoagulation Instructions: INR 3.3 Take coumadin 1/2 tablet tonight then resume 1 1/2 tablets once daily except 1 tablet on Mondays and Fridays

## 2010-12-24 NOTE — Medication Information (Signed)
Summary: ccr-lr  Anticoagulant Therapy  Managed by: Vashti Hey, RN PCP: Dr. [place] Bing Supervising MD: Diona Browner MD, Remi Deter Indication 1: Atrial Fibrillation Lab Used: [facility] [place] Site:  INR POC 2.4  Dietary changes: no    Health status changes: no    Bleeding/hemorrhagic complications: no    Recent/future hospitalizations: no    Any changes in medication regimen? no    Recent/future dental: no  Any missed doses?: no       Is patient compliant with meds? yes       Allergies: 1)  ! * Rampiril  Anticoagulation Management History:      The patient is taking warfarin and comes in today for a routine follow up visit.  Positive risk factors for bleeding include an age of 60 years or older.  The bleeding index is 'intermediate risk'.  Positive CHADS2 values include History of CHF, History of HTN, and Age > 63 years old.  The start date was 04/24/2005.  Anticoagulation responsible provider: Diona Browner MD, Remi Deter.  INR POC: 2.4.  Cuvette Lot#: [id number].  Exp: 10/11.    Anticoagulation Management Assessment/Plan:      The patient's current anticoagulation dose is Coumadin 5 mg tabs: Marland Kitchen  The target INR is 2 - 3.  The next INR is due 02/25/2010.  Anticoagulation instructions were given to patient.  Results were reviewed/authorized by Vashti Hey, RN.  She was notified by Vashti Hey RN.         Prior Anticoagulation Instructions: INR 2.1 Continue coumadin 5mg  once daily except 7.5mg  on Sundays, Tuesdays and Thursdays  Current Anticoagulation Instructions: INR 2.4 Continue coumadin 5mg  once daily except 7.5mg  on Sundays, Tuesdays and Thursdays

## 2010-12-24 NOTE — Medication Information (Signed)
Summary: protime/tg  Anticoagulant Therapy  Managed by: Vashti Hey, RN PCP: Dr. [place] Bing Supervising MD: Dietrich Pates MD, Molly Maduro Indication 1: Atrial Fibrillation Lab Used: [facility] [place] Site: [place] INR POC 2.5  Dietary changes: no    Health status changes: no    Bleeding/hemorrhagic complications: no    Recent/future hospitalizations: no    Any changes in medication regimen? no    Recent/future dental: no  Any missed doses?: no       Is patient compliant with meds? yes       Allergies: 1)  ! * Rampiril  Anticoagulation Management History:      The patient is taking warfarin and comes in today for a routine follow up visit.  Positive risk factors for bleeding include an age of 75 years or older.  The bleeding index is 'intermediate risk'.  Positive CHADS2 values include History of CHF, History of HTN, and Age > 22 years old.  The start date was 04/24/2005.  Her last INR was 2.6.  Anticoagulation responsible [place]: Dietrich Pates MD, Molly Maduro.  INR POC: 2.5.  Cuvette Lot#: [id number].  Exp: 10/11.    Anticoagulation Management Assessment/Plan:      The patient's current anticoagulation dose is Coumadin 5 mg tabs: Marland Kitchen  The target INR is 2 - 3.  The next INR is due 10/10/2010.  Anticoagulation instructions were given to patient.  Results were reviewed/authorized by Vashti Hey, RN.  She was notified by Vashti Hey RN.         Prior Anticoagulation Instructions: INR 3.3 Take coumadin 1/2 tablet tonight then resume 1 1/2 tablets once daily except 1 tablet on Mondays and Fridays  Current Anticoagulation Instructions: INR 2.5 Continue coumadin 7.5mg  once daily except 5mg  on Mondays and Fridays

## 2010-12-24 NOTE — Miscellaneous (Signed)
Summary: Orders Update: hemocult cards  Clinical Lists Changes  Orders: Added new Test order of Hemoccult Cards (Take Home) (Hemoccult Cards) - Signed

## 2010-12-24 NOTE — Miscellaneous (Signed)
  Clinical Lists Changes  Medications: Changed medication from DIGOXIN 0.25 MG TABS (DIGOXIN) take 1 tab daily to DIGOXIN 0.25 MG TABS (DIGOXIN) one tablet on MWF, 1/2 tablet all other days

## 2010-12-24 NOTE — Miscellaneous (Signed)
Summary: hemoccult cards 07/30/2010  Clinical Lists Changes  Observations: Added new observation of HEMOCCULT 3: neg (07/30/2010 15:01) Added new observation of HEMOCCULT 2: neg (07/30/2010 15:01) Added new observation of HEMOCCULT 1: neg (07/30/2010 15:01)

## 2010-12-24 NOTE — Letter (Signed)
Summary: [facility] 3.23.11  [facility] 3.23.11   Imported By: Faythe Ghee 02/25/2010 11:26:56  _____________________________________________________________________  External Attachment:    Type:   Image     Comment:   External Document

## 2010-12-24 NOTE — Medication Information (Signed)
Summary: ccr-lr  Anticoagulant Therapy  Managed by: Vashti Hey, RN PCP: Dr. [place] Bing Supervising MD: Eden Emms MD, Theron Arista Indication 1: Atrial Fibrillation Lab Used: [facility] [place] Site: [place] INR POC 1.7  Dietary changes: no    Health status changes: no    Bleeding/hemorrhagic complications: no    Recent/future hospitalizations: no    Any changes in medication regimen? no    Recent/future dental: no  Any missed doses?: no       Is patient compliant with meds? yes       Allergies: 1)  ! * Rampiril  Anticoagulation Management History:      The patient is taking warfarin and comes in today for a routine follow up visit.  Positive risk factors for bleeding include an age of 75 years or older.  The bleeding index is 'intermediate risk'.  Positive CHADS2 values include History of CHF, History of HTN, and Age > 5 years old.  The start date was 04/24/2005.  Anticoagulation responsible provider: Eden Emms MD, Theron Arista.  INR POC: 1.7.  Cuvette Lot#: [id number].  Exp: 10/11.    Anticoagulation Management Assessment/Plan:      The patient's current anticoagulation dose is Coumadin 5 mg tabs: Marland Kitchen  The target INR is 2 - 3.  The next INR is due 03/25/2010.  Anticoagulation instructions were given to patient.  Results were reviewed/authorized by Vashti Hey, RN.  She was notified by Vashti Hey RN.         Prior Anticoagulation Instructions: INR 1.8 Take coumadin 10mg  x 2 then resume 5mg  once daily except 7.5mg  on Sundays, Tuesdays and Thursdays  Current Anticoagulation Instructions: INR 1.7 Take coumadin 10mg  tonight and tomorrow night then increase dose to 7.5mg  once daily except 5mg  on Mondays and Fridays Pt leaving today for [place] x 4 weeks.  Order given to pt to have INR checked there week of 03/25/10 with results faxed to me.

## 2010-12-24 NOTE — Medication Information (Signed)
Summary: ccr-lr  Anticoagulant Therapy  Managed by: Vashti Hey, RN PCP: Dr. [place] Bing Supervising MD: Diona Browner MD, Remi Deter Indication 1: Atrial Fibrillation Lab Used: [facility]  Site: [place] INR POC 3.1           Allergies: 1)  ! * Rampiril  Anticoagulation Management History:      Positive risk factors for bleeding include an age of 75 years or older.  The bleeding index is 'intermediate risk'.  Positive CHADS2 values include History of CHF, History of HTN, and Age > 25 years old.  The start date was 04/24/2005.  Her last INR was 2.6.  Anticoagulation responsible provider: Diona Browner MD, Remi Deter.  INR POC: 3.1.  Exp: 10/11.    Anticoagulation Management Assessment/Plan:      The patient's current anticoagulation dose is Coumadin 5 mg tabs: Marland Kitchen  The target INR is 2 - 3.  The next INR is due 06/03/2010.  Anticoagulation instructions were given to patient.  Results were reviewed/authorized by Vashti Hey, RN.  She was notified by Vashti Hey RN.         Prior Anticoagulation Instructions: INR 3.0 Continue coumadin 7.5mg  once daily except 5mg  on Mondays and Fridays  Current Anticoagulation Instructions: INR 3.1 Hold coumadin tonight then resume 7.5mg  once daily except 5mg  on Mondays and Fridays Leaving for [place] 05-03-10.  Will be gone 4-5 weeks.

## 2010-12-24 NOTE — Progress Notes (Signed)
Summary: Toprol XL dose   Phone Note From Pharmacy   Details for Reason: Toprol XL Summary of Call: S: RX for Toprol XL 50mg  (take 1 and 1/2 tablet po qd) was sent to  [facility] Pt. states that she has never been on Toprol 50mg  daily, she states she has always taken 25mg  by mouth once daily. B: On 09-22-08 OV pt. was taking 25mg  once daily but was increased to 50mg  once daily. On 11-15-08 OV, Toprol XL was increased to 75mg  (1 and 1/2 tablet daily) by Dr. Daleen Squibb. On 11-27-08 Toprol was decreased to  50mg  once daily. Increased to 75mg  daily on 03-22-09 . Decreased to 50mg  daily  on 06-04-09. 09-26-09 pt. taking 75mg  and has not changed in chart since. A: Pt. states she has been taking Toprol XL 25mg  for years and has had no problems with the dose. She denies CP and states she has had no issues with her BP. R: Pt. advised that a note would be sent to Dr. Dietrich Pates and we will call her back with his recommendations. Initial call taken by: Larita Fife Via LPN,  March 04, 2010 4:06 PM  Follow-up for Phone Call        My most recent 2 office notes indicate a dose of 25 mg q.d.  That what appeared to be the correct dose. Follow-up by: Kathlen Brunswick, MD, [facility],  March 05, 2010 9:25 AM  Additional Follow-up for Phone Call Additional follow up Details #1::        Pt. and pharmacy advised. RX for Toprol XL 25mg  by mouth once daily called in to [facility] for #30 with 3 refills. Pt. aware.  Additional Follow-up by: Larita Fife Via LPN,  March 05, 2010 10:17 AM

## 2010-12-24 NOTE — Medication Information (Signed)
Summary: ccr=lr  Anticoagulant Therapy  Managed by: Vashti Hey, RN PCP: Dr. [place] Bing Supervising MD: Diona Browner MD, Remi Deter Indication 1: Atrial Fibrillation Lab Used: [facility] [place] Site: [place] INR POC 3.0  Dietary changes: no    Health status changes: no    Bleeding/hemorrhagic complications: no    Recent/future hospitalizations: no    Any changes in medication regimen? no    Recent/future dental: no  Any missed doses?: no       Is patient compliant with meds? yes       Allergies: 1)  ! * Rampiril  Anticoagulation Management History:      The patient is taking warfarin and comes in today for a routine follow up visit.  Positive risk factors for bleeding include an age of 75 years or older.  The bleeding index is 'intermediate risk'.  Positive CHADS2 values include History of CHF, History of HTN, and Age > 59 years old.  The start date was 04/24/2005.  Her last INR was 2.6.  Anticoagulation responsible provider: Diona Browner MD, Remi Deter.  INR POC: 3.0.  Cuvette Lot#: [id number].  Exp: 10/11.    Anticoagulation Management Assessment/Plan:      The patient's current anticoagulation dose is Coumadin 5 mg tabs: Marland Kitchen  The target INR is 2 - 3.  The next INR is due 04/29/2010.  Anticoagulation instructions were given to patient.  Results were reviewed/authorized by Vashti Hey, RN.  She was notified by Vashti Hey RN.         Prior Anticoagulation Instructions: Pt had PT/INR obtained in [place] 03/21/10.  Received fax this am.  PT 27.8  INR 2.6  Pt called to get results.  She was told to continue coumadin 7.5mg  once daily except 5mg  on Mondays and Fridays.  She verbalized understanding.  She already has f/u INR appt on 5/18 when she returns home.  Current Anticoagulation Instructions: INR 3.0 Continue coumadin 7.5mg  once daily except 5mg  on Mondays and Fridays

## 2010-12-24 NOTE — Assessment & Plan Note (Signed)
Summary: 3 MTH F/UPER CHECKOUT ON 09/26/09/TG      Allergies Added:   Visit Type:  Follow-up Referring Provider:  Dr. Glenford Peers Primary Provider:  Dr. 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  Since developing clinical congestive heart failure approximately 18 months ago, she has improved substantially, both objectively in terms of ejection fraction and subjectively in terms of symptoms.  She now rarely experiences dyspnea, estimating she could walk up 2 flights of stairs without significant symptoms.  There's been no orthopnea nor PND.  She notes no palpitations.  Anticoagulation has been stable without complications.  She has just returned from a month of traveling and will be going to the [facility] in the near future.  A prescription for drug to treat altitude sickness is requested.  Patient, her husband and I also discussed her psychological state.  Her daughter is involved in a custody dispute, which has caused considerable anger, anxiety and perhaps depression on Ms. Stoffers's part.  Her husband has suggested that she see a therapist, but she is resisting that advice.  GI symptoms have also been prominent.  She developed significant abdominal discomfort and anorexia while taking Neurontin, which was discontinued some time ago.  She experienced a 35 pound weight loss at the time of her treatment for carcinoma of the breast, but weight has been stable of late.  Appetite is substantially depressed.  There may be some impairment in olfactory sensation and taste, but this is not definite.  Current Medications (verified): 1)  Coumadin 5 Mg Tabs (Warfarin Sodium) 2)  Furosemide 20 Mg Tabs (Furosemide) .... Take 1 Tab Daily 3)  Klor-Con M20 20 Meq Cr-Tabs (Potassium Chloride Crys Cr) .... Take 1 Tab Daily 4)   Diovan 80 Mg Tabs (Valsartan) .... Take 1 Tab Daily 5)  Lopressor 50 Mg Tabs (Metoprolol Tartrate) .... Take 1`/2 Tab Daily 6)  Digoxin 0.25 Mg Tabs (Digoxin) .... Take 1 Tab Daily  Allergies (verified): 1)  ! * Rampiril  Past History:  PMH, FH, and Social History reviewed and updated.  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; Ejection fraction of 40% in 12/09        and 50% in 01/2009; pulmonary edema in 2009 Sick sinus syndrome: Paroxysmal atrial fibrillation; first degree AV block-->persistent CHRONIC KIDNEY DISEASE STAGE I (ICD-585.1)when on diuretic with creatinine of 1.6 HYPERTENSION (ICD-401.9)-mild ADENOCARCINOMA, BREAST,-treated with lumpectomy, node dissection, chemotherapy and RT HEARING IMPAIRMENT (ICD-389.9) PAROXYSMAL ATRIAL FIBRILLATION (ICD-427.31) Degenerative joint disease-right knee and lumbosacral spine ANGINA, HX OF (ICD-V12.50)with normal coronary arteries in 2001  Review of Systems       The patient complains of anorexia, weight loss, hoarseness, and abdominal pain.  The patient denies fever, vision loss, decreased hearing, chest pain, syncope, dyspnea on exertion, peripheral edema, prolonged cough, and headaches.         Abdominal discomfort typically occurs postprandially and resolve spontaneously.  There's been no nausea nor emesis.  She has had variable diarrhea and constipation.  She has been followed by Dr. Karilyn Cota in the past.  She also reports continuing numbness of her fingers, which leads to problems with grip.  Vital Signs:  Patient profile:   77 year old female Weight:      118 pounds Pulse rate:   72 / minute BP sitting:   124 / 77  (right arm)  Vitals Entered By: Dreama Saa, CNA (December 27, 2009 11:23 AM)  Physical Exam  General:   General-Well developed; no acute distress;  Neck-No JVD; no carotid bruits: Lungs-No tachypnea, no rales ;minor expiratory rhonchi Cardiovascular-normal PMI; normal  S1 and S2; irregular rhythm; minimal systolic murmur Abdomen-BS normal; soft and non-tender without masses or organomegaly:  Musculoskeletal-No deformities, no cyanosis or clubbing: Neurologic-Normal cranial nerves; symmetric strength and tone:  Skin-Warm, no significant lesions: Extremities-Nl distal pulses; no edema:     Impression & Recommendations:  Problem # 1:  CARDIOMYOPATHY (ICD-425.4) She is doing better than she generally has done since developing cardiomyopathy.  Congestive heart failure is compensated on modest p.r.n. use of furosemide.  She will continue her current medical regime.  Problem # 2:  ATRIAL FIBRILLATION (ICD-427.31) Heart rate control is excellent.  Anticoagulation has been stable and therapeutic.  Recent CBC and stool for Hemoccult testing were normal.  Current medical regime for this problem will be continued.  Problem # 3:  ABDOMINAL PAIN (ICD-789.00) Symptoms are most compatible with irritable bowel syndrome.  Patient will consult Dr. Karilyn Cota if symptoms persist.  Digoxin level will be measured, but I doubt toxicity at current dose with normal renal function.  Problem # 4:  Stress reaction I recommended initial consultation with a psychologist or psychiatrist as a very appropriate step.  Patient continues to resist this recommendation.  I'll plan to see this nice woman again in 5 months.  Other Orders: T-Comprehensive Metabolic Panel [phone]) T-Digoxin [phone]) T-Chest x-ray, 2 views ([id number])  EKG  Procedure date:  12/27/2009  Findings:      Rhythm Strip  Atrial fibrillation with controlled ventricular response; heart rate is 72 bpm  Occasional PVC vs. aberrancy   Patient Instructions: 1)  Your physician recommends that you schedule a follow-up appointment in: 5 months 2)  Your physician recommends that you return for lab work in: late this afternoon  Appended Document: 3 MTH F/UPER CHECKOUT ON 09/26/09/TG Issue of prophylaxis or Rx  for altitude related illness raised at the patient's last visit.  Literature search was performed.  Current pharmacologic treatments are all prophylactic and include Diamox, dexamethasone and nifedipine.  Recommendation is that these only be used in individuals with documented previous altitude-related illness.  Since treatment modalities are benign, I would be willing to prescribe them for the Gade's, but do not think they're necessary.  We will share this information with them.  [place] Bing, M.D.  Appended Document: 3 MTH F/UPER CHECKOUT ON 09/26/09/TG I shared information with pt, no request for meds at this time

## 2010-12-24 NOTE — Medication Information (Signed)
Summary: ccr-lr  Anticoagulant Therapy  Managed by: Vashti Hey, RN PCP: Dr. [place] Bing Supervising MD: Dietrich Pates MD, Molly Maduro Indication 1: Atrial Fibrillation Lab Used: [facility]  Site: [place] INR POC 2.1  Dietary changes: no    Health status changes: no    Bleeding/hemorrhagic complications: no    Recent/future hospitalizations: no    Any changes in medication regimen? no    Recent/future dental: no  Any missed doses?: no       Is patient compliant with meds? yes       Allergies: 1)  ! * Rampiril  Anticoagulation Management History:      The patient is taking warfarin and comes in today for a routine follow up visit.  Positive risk factors for bleeding include an age of 37 years or older.  The bleeding index is 'intermediate risk'.  Positive CHADS2 values include History of CHF, History of HTN, and Age > 26 years old.  The start date was 04/24/2005.  Anticoagulation responsible provider: Dietrich Pates MD, Molly Maduro.  INR POC: 2.1.  Cuvette Lot#: [id number].  Exp: 10/11.    Anticoagulation Management Assessment/Plan:      The patient's current anticoagulation dose is Coumadin 5 mg tabs: Marland Kitchen  The target INR is 2 - 3.  The next INR is due 01/24/2010.  Anticoagulation instructions were given to patient.  Results were reviewed/authorized by Vashti Hey, RN.  She was notified by Vashti Hey RN.         Prior Anticoagulation Instructions: INR 1.6 Take coumadin 2 1/2 tablets tonight, 2 tablets tomorrow night then resume 1 tablet once daily except 1 1/2 tablets on Sundays, Tuesdays and Thursdays  Current Anticoagulation Instructions: INR 2.1 Continue coumadin 5mg  once daily except 7.5mg  on Sundays, Tuesdays and Thursdays

## 2010-12-26 NOTE — Medication Information (Signed)
Summary: ccr-lr  Anticoagulant Therapy  Managed by: Vashti Hey, RN PCP: Dr. [place] Bing Supervising MD: Diona Browner MD, Remi Deter Indication 1: Atrial Fibrillation Lab Used: [facility] [place] Site: [place] INR POC 2.9  Dietary changes: no    Health status changes: no    Bleeding/hemorrhagic complications: no    Recent/future hospitalizations: no    Any changes in medication regimen? no    Recent/future dental: no  Any missed doses?: no       Is patient compliant with meds? yes       Allergies: 1)  ! * Rampiril  Anticoagulation Management History:      The patient is taking warfarin and comes in today for a routine follow up visit.  Positive risk factors for bleeding include an age of 74 years or older.  The bleeding index is 'intermediate risk'.  Positive CHADS2 values include History of CHF, History of HTN, and Age > 68 years old.  The start date was 04/24/2005.  Her last INR was 2.6.  Anticoagulation responsible [place] [facility]: Diona Browner MD, Remi Deter.  INR POC: 2.9.  Cuvette Lot#: [id number].  Exp: 10/11.    Anticoagulation Management Assessment/Plan:      The patient's current anticoagulation dose is Coumadin 5 mg tabs: Marland Kitchen  The target INR is 2 - 3.  The next INR is due 12/05/2010.  Anticoagulation instructions were given to patient.  Results were reviewed/authorized by Vashti Hey, RN.  She was notified by Vashti Hey RN.         Prior Anticoagulation Instructions: INR 2.8 Continue coumadin 7.5mg  once daily except 5mg  on Mondays and Fridays  Current Anticoagulation Instructions: INR 2.9 Continue coumadin 7.5mg  once daily except 5mg  on Mondays and Fridays Pt will be in [place] in 1 month.  She will have INR checked there.

## 2010-12-26 NOTE — Letter (Signed)
Summary: [facility]  [facility] NOTE   Imported By: Faythe Ghee 12/12/2010 13:04:21  _____________________________________________________________________  External Attachment:    Type:   Image     Comment:   External Document

## 2010-12-26 NOTE — Medication Information (Signed)
Summary: [facility] Clinic  Anticoagulant Therapy  Managed by: Vashti Hey, RN PCP: Dr. [place] Bing Supervising MD: Diona Browner MD, Remi Deter Indication 1: Atrial Fibrillation Lab Used: [facility] [place] Site: [place] PT 30.5 INR POC 3.0  Dietary changes: no    Health status changes: no    Bleeding/hemorrhagic complications: no    Recent/future hospitalizations: no    Any changes in medication regimen? no    Recent/future dental: no  Any missed doses?: no       Is patient compliant with meds? yes      Comments: Pt is in [place].  Received fax from [facility] there with results of PT/INR.  PT 30.5  INR 3.0  Allergies: 1)  ! * Rampiril  Anticoagulation Management History:      Her anticoagulation is being managed by telephone today.  Positive risk factors for bleeding include an age of 57 years or older.  The bleeding index is 'intermediate risk'.  Positive CHADS2 values include History of CHF, History of HTN, and Age > 83 years old.  The start date was 04/24/2005.  Her last INR was 2.6.  Prothrombin time is 30.5.  Anticoagulation responsible provider: Diona Browner MD, Remi Deter.  INR POC: 3.0.  Exp: 10/11.    Anticoagulation Management Assessment/Plan:      The patient's current anticoagulation dose is Coumadin 5 mg tabs: Marland Kitchen  The target INR is 2 - 3.  The next INR is due 12/30/2010.  Anticoagulation instructions were given to patient.  Results were reviewed/authorized by Vashti Hey, RN.  She was notified by Vashti Hey RN.         Prior Anticoagulation Instructions: INR 2.9 Continue coumadin 7.5mg  once daily except 5mg  on Mondays and Fridays Pt will be in [place] in 1 month.  She will have INR checked there.  Current Anticoagulation Instructions: INR 3.0 [facility] in [place] Continue coumadin 7.5mg  once daily except 5mg  on Mondays and Fridays Will be back home 12/24/10 and call for f/u INR check then

## 2011-01-01 ENCOUNTER — Encounter: Payer: Self-pay | Admitting: Cardiology

## 2011-01-01 ENCOUNTER — Encounter (INDEPENDENT_AMBULATORY_CARE_PROVIDER_SITE_OTHER): Payer: Medicare Other

## 2011-01-01 DIAGNOSIS — I4891 Unspecified atrial fibrillation: Secondary | ICD-10-CM

## 2011-01-01 DIAGNOSIS — Z7901 Long term (current) use of anticoagulants: Secondary | ICD-10-CM

## 2011-01-01 LAB — CONVERTED CEMR LAB: POC INR: 2.8

## 2011-01-09 NOTE — Medication Information (Signed)
Summary: protime/tg  Anticoagulant Therapy Managed by: Vashti Hey, RN Patient Assessment Part 2:  Have you MISSED ANY DOSES or CHANGED TABLETS?  0  Have you had any BRUISING or BLEEDING ( nose or gum bleeds,blood in urine or stool)?  Have you STARTED or STOPPED any MEDICATIONS, including OTC meds,herbals or supplements?  Have you CHANGED your DIET, especially green vegetables,or ALCOHOL intake?  Have you had any ILLNESSES or HOSPITALIZATIONS?  Have you had any signs of CLOTTING?(chest discomfort,dizziness,shortness of breath,arms tingling,slurred speech,swelling or redness in leg)       Regimen Out:    Total Weekly: 47.50 mg mg  Next INR Due: 02/05/2011      Allergies: 1)  ! * Rampiril  Anticoagulant Therapy  Managed by: Vashti Hey, RN PCP: Dr. Southmont Bing Supervising MD: Dietrich Pates MD, Molly Maduro Indication 1: Atrial Fibrillation Lab Used: [facility] [place] Site: [place] INR POC 2.8  Dietary changes: no    Health status changes: no    Bleeding/hemorrhagic complications: no    Recent/future hospitalizations: no    Any changes in medication regimen? no    Recent/future dental: no  Any missed doses?: no       Is patient compliant with meds? yes          Anticoagulation Management History:      The patient is taking warfarin and comes in today for a routine follow up visit.  Positive risk factors for bleeding include an age of 60 years or older.  The bleeding index is 'intermediate risk'.  Positive CHADS2 values include History of CHF, History of HTN, and Age > 25 years old.  The start date was 04/24/2005.  Her last INR was 2.6.  Anticoagulation responsible provider: Dietrich Pates MD, Molly Maduro.  INR POC: 2.8.  Cuvette Lot#: [id number].  Exp: 10/11.    Anticoagulation Management Assessment/Plan:      The patient's current anticoagulation dose is Coumadin 5 mg tabs: Marland Kitchen  The target INR is 2 - 3.  The next INR is due 02/05/2011.   Anticoagulation instructions were given to patient.  Results were reviewed/authorized by Vashti Hey, RN.  She was notified by Vashti Hey RN.         Prior Anticoagulation Instructions: INR 3.0 [facility] in [place] Continue coumadin 7.5mg  once daily except 5mg  on Mondays and Fridays Will be back home 12/24/10 and call for f/u INR check then  Current Anticoagulation Instructions: INR 2.8 Continue coumadin 7.5mg  once daily except 5mg  on Mondays and Fridays

## 2011-02-04 LAB — COMPREHENSIVE METABOLIC PANEL
Albumin: 3.5 g/dL (ref 3.5–5.2)
BUN: 15 mg/dL (ref 6–23)
Calcium: 8.8 mg/dL (ref 8.4–10.5)
Glucose, Bld: 110 mg/dL — ABNORMAL HIGH (ref 70–99)
Total Protein: 6.3 g/dL (ref 6.0–8.3)

## 2011-02-04 LAB — DIFFERENTIAL
Eosinophils Absolute: 0.1 10*3/uL (ref 0.0–0.7)
Eosinophils Relative: 2 % (ref 0–5)
Lymphs Abs: 0.9 10*3/uL (ref 0.7–4.0)
Monocytes Relative: 11 % (ref 3–12)

## 2011-02-04 LAB — CBC
HCT: 36.8 % (ref 36.0–46.0)
MCHC: 34 g/dL (ref 30.0–36.0)
MCV: 106.4 fL — ABNORMAL HIGH (ref 78.0–100.0)
Platelets: 144 10*3/uL — ABNORMAL LOW (ref 150–400)
RDW: 13.2 % (ref 11.5–15.5)
WBC: 3.7 10*3/uL — ABNORMAL LOW (ref 4.0–10.5)

## 2011-02-05 ENCOUNTER — Encounter (INDEPENDENT_AMBULATORY_CARE_PROVIDER_SITE_OTHER): Payer: Medicare Other

## 2011-02-05 ENCOUNTER — Encounter: Payer: Self-pay | Admitting: Cardiology

## 2011-02-05 DIAGNOSIS — I4891 Unspecified atrial fibrillation: Secondary | ICD-10-CM

## 2011-02-05 DIAGNOSIS — Z7901 Long term (current) use of anticoagulants: Secondary | ICD-10-CM

## 2011-02-07 ENCOUNTER — Encounter: Payer: Self-pay | Admitting: *Deleted

## 2011-02-10 ENCOUNTER — Ambulatory Visit (INDEPENDENT_AMBULATORY_CARE_PROVIDER_SITE_OTHER): Payer: Medicare Other | Admitting: Cardiology

## 2011-02-10 ENCOUNTER — Encounter: Payer: Self-pay | Admitting: Cardiology

## 2011-02-10 DIAGNOSIS — I429 Cardiomyopathy, unspecified: Secondary | ICD-10-CM

## 2011-02-10 DIAGNOSIS — I4891 Unspecified atrial fibrillation: Secondary | ICD-10-CM

## 2011-02-10 LAB — COMPREHENSIVE METABOLIC PANEL
Albumin: 3.6 g/dL (ref 3.5–5.2)
BUN: 16 mg/dL (ref 6–23)
Chloride: 108 mEq/L (ref 96–112)
Creatinine, Ser: 0.91 mg/dL (ref 0.4–1.2)
Total Bilirubin: 0.8 mg/dL (ref 0.3–1.2)
Total Protein: 5.9 g/dL — ABNORMAL LOW (ref 6.0–8.3)

## 2011-02-10 LAB — DIFFERENTIAL
Basophils Absolute: 0 10*3/uL (ref 0.0–0.1)
Lymphocytes Relative: 26 % (ref 12–46)
Monocytes Absolute: 0.3 10*3/uL (ref 0.1–1.0)
Neutro Abs: 2.1 10*3/uL (ref 1.7–7.7)

## 2011-02-10 LAB — CBC
HCT: 35 % — ABNORMAL LOW (ref 36.0–46.0)
MCHC: 34.5 g/dL (ref 30.0–36.0)
MCV: 107.1 fL — ABNORMAL HIGH (ref 78.0–100.0)
Platelets: 150 10*3/uL (ref 150–400)
RDW: 14.4 % (ref 11.5–15.5)

## 2011-02-11 LAB — CONVERTED CEMR LAB
AST: 22 units/L (ref 0–37)
BUN: 18 mg/dL (ref 6–23)
Basophils Relative: 1 % (ref 0–1)
Calcium: 9.2 mg/dL (ref 8.4–10.5)
Chloride: 105 meq/L (ref 96–112)
Creatinine, Ser: 0.88 mg/dL (ref 0.40–1.20)
Eosinophils Absolute: 0.1 10*3/uL (ref 0.0–0.7)
Glucose, Bld: 92 mg/dL (ref 70–99)
Hemoglobin: 13 g/dL (ref 12.0–15.0)
Lymphs Abs: 1.1 10*3/uL (ref 0.7–4.0)
MCHC: 32.6 g/dL (ref 30.0–36.0)
MCV: 107.3 fL — ABNORMAL HIGH (ref 78.0–100.0)
Monocytes Absolute: 0.4 10*3/uL (ref 0.1–1.0)
Monocytes Relative: 9 % (ref 3–12)
RBC: 3.72 M/uL — ABNORMAL LOW (ref 3.87–5.11)

## 2011-02-11 NOTE — Miscellaneous (Signed)
Summary: labs cbcd,cmp,07/24/2010  Clinical Lists Changes  Observations: Added new observation of CALCIUM: 8.7 mg/dL (40/98/1191 47:82) Added new observation of ALBUMIN: 3.9 g/dL (95/62/1308 65:78) Added new observation of PROTEIN, TOT: 5.7 g/dL (46/96/2952 84:13) Added new observation of SGPT (ALT): 14 units/L (07/24/2010 16:38) Added new observation of SGOT (AST): 21 units/L (07/24/2010 16:38) Added new observation of ALK PHOS: 62 units/L (07/24/2010 16:38) Added new observation of CREATININE: 0.88 mg/dL (24/40/1027 25:36) Added new observation of BUN: 19 mg/dL (64/40/3474 25:95) Added new observation of BG RANDOM: 75 mg/dL (63/87/5643 32:95) Added new observation of CO2 PLSM/SER: 29 meq/L (07/24/2010 16:38) Added new observation of CL SERUM: 104 meq/L (07/24/2010 16:38) Added new observation of K SERUM: 4.2 meq/L (07/24/2010 16:38) Added new observation of NA: 144 meq/L (07/24/2010 16:38) Added new observation of ABSOLUTE BAS: 0.0 K/uL (07/24/2010 16:38) Added new observation of BASOPHIL %: 1 % (07/24/2010 16:38) Added new observation of EOS ABSLT: 0.1 K/uL (07/24/2010 16:38) Added new observation of % EOS AUTO: 1 % (07/24/2010 16:38) Added new observation of ABSOLUTE MON: 0.3 K/uL (07/24/2010 16:38) Added new observation of MONOCYTE %: 7 % (07/24/2010 16:38) Added new observation of ABS LYMPHOCY: 0.8 K/uL (07/24/2010 16:38) Added new observation of LYMPHS %: 21 % (07/24/2010 16:38) Added new observation of PLATELETK/UL: 141 K/uL (07/24/2010 16:38) Added new observation of RDW: 13.4 % (07/24/2010 16:38) Added new observation of MCHC RBC: 32.4 g/dL (18/84/1660 63:01) Added new observation of MCV: 107.0 fL (07/24/2010 16:38) Added new observation of HCT: 38.0 % (07/24/2010 16:38) Added new observation of HGB: 12.3 g/dL (60/08/9322 55:73) Added new observation of RBC M/UL: 3.55 M/uL (07/24/2010 16:38) Added new observation of WBC COUNT: 4.0 10*3/microliter (07/24/2010 16:38)

## 2011-02-11 NOTE — Medication Information (Signed)
Summary: CCR PER PT WALK IN/TMJ  Anticoagulant Therapy  Managed by: Vashti Hey, RN PCP: Dr. Voltaire Bing Supervising MD: Daleen Squibb MD, Maisie Fus Indication 1: Atrial Fibrillation Lab Used: [facility]  Site: [place] INR POC 3.6  Dietary changes: no    Health status changes: no    Bleeding/hemorrhagic complications: no    Recent/future hospitalizations: no    Any changes in medication regimen? no    Recent/future dental: no  Any missed doses?: no       Is patient compliant with meds? yes       Allergies: 1)  ! * Rampiril  Anticoagulation Management History:      The patient is taking warfarin and comes in today for a routine follow up visit.  Positive risk factors for bleeding include an age of 75 years or older.  The bleeding index is 'intermediate risk'.  Positive CHADS2 values include History of CHF, History of HTN, and Age > 44 years old.  The start date was 04/24/2005.  Her last INR was 2.6.  Anticoagulation responsible provider: Daleen Squibb MD, Maisie Fus.  INR POC: 3.6.  Cuvette Lot#: [id number].  Exp: 10/11.    Anticoagulation Management Assessment/Plan:      The patient's current anticoagulation dose is Coumadin 5 mg tabs: Marland Kitchen  The target INR is 2 - 3.  The next INR is due 03/05/2011.  Anticoagulation instructions were given to patient.  Results were reviewed/authorized by Vashti Hey, RN.  She was notified by Vashti Hey RN.         Prior Anticoagulation Instructions: INR 2.8 Continue coumadin 7.5mg  once daily except 5mg  on Mondays and Fridays  Current Anticoagulation Instructions: INR 3.6 Hold coumadin tonight then resume 7.5mg  once daily except 5mg  once daily Mondays and Fridays

## 2011-02-20 NOTE — Assessment & Plan Note (Signed)
Summary: RETURN OFFICE VISIT  Medications Added FUROSEMIDE 20 MG TABS (FUROSEMIDE) take as needed 20 to 40 mg FISH OIL 1000 MG CAPS (OMEGA-3 FATTY ACIDS) take 1 tab daily FENOPROFEN CALCIUM 600 MG TABS (FENOPROFEN CALCIUM) take 1 tab daily      Allergies Added:   Visit Type:  Follow-up Referring Provider:  Oncology-Dr. Glenford Peers Primary Provider:  Dr. Canova Bing   History of Present Illness: Alicia Harrington returns to the office as scheduled for continued assessment and treatment of atrial fibrillation and cardiomyopathy.  Since her last visit, she was vacationing at altitude and developed increased edema and dyspnea.  With higher doses of furosemide, symptoms were managed, but not completely relieved.  Once she returned to a lower altitude, she returned to her baseline status is class II dyspnea on exertion.  She has noted no palpitations, no lightheadedness no syncope.  Her husband has discussed her case with a number of physicians, and is very interested in pursuing possible radiofrequency ablation to abolish her atrial fibrillation.  Current Medications (verified): 1)  Coumadin 5 Mg Tabs (Warfarin Sodium) 2)  Furosemide 20 Mg Tabs (Furosemide) .... Take As Needed 20 To 40 Mg 3)  Klor-Con M20 20 Meq Cr-Tabs (Potassium Chloride Crys Cr) .... Take As Needed 4)  Diovan 80 Mg Tabs (Valsartan) .... Take 1 Tab Daily 5)  Toprol Xl 25 Mg Xr24h-Tab (Metoprolol Succinate) .... Take One Tablet Daily 6)  Digoxin 0.25 Mg Tabs (Digoxin) .... One Tablet On Mwf, 1/2 Tablet All Other Days 7)  Tamoxifen Citrate 20 Mg Tabs (Tamoxifen Citrate) .... Take 1 Tab Daily 8)  Fish Oil 1000 Mg Caps (Omega-3 Fatty Acids) .... Take 1 Tab Daily 9)  Fenoprofen Calcium 600 Mg Tabs (Fenoprofen Calcium) .... Take 1 Tab Daily  Allergies (verified): 1)  ! * Rampiril  Comments:  Nurse/Medical Assistant: patient and i reviewed meds she uses Estate agent  Past History:  PMH, FH, and Social  History reviewed and updated.  Review of Systems       See history of present illness.  Vital Signs:  Patient profile:   77 year old female Weight:      117 pounds BMI:     22.93 O2 Sat:      96 % on Room air Pulse rate:   63 / minute BP sitting:   128 / 69  (left arm)  Vitals Entered By: Dreama Saa, CNA (February 10, 2011 2:54 PM)  O2 Flow:  Room air  Physical Exam  General:  Trim; well developed; no acute distress;  Neck-No JVD; no carotid bruits: Lungs-No tachypnea, no rales normal rhonchi Cardiovascular-normal PMI; normal S1 and S2; irregular rhythm; minimal systolic murmur Abdomen-BS normal; soft and non-tender without masses or organomegaly:  Musculoskeletal-No deformities, no cyanosis or clubbing: Neurologic-Normal cranial nerves; symmetric strength and tone:  Skin-Warm, no significant lesions: Extremities-Nl distal pulses; no edema:     Impression & Recommendations:  Problem # 1:  ATRIAL FIBRILLATION (ICD-427.31) Alicia Harrington has done very well in recent months, and I see no compelling indication to proceed with radiofrequency ablation.   Dr. Kate Sable is worried about possible adverse effects of anticoagulation and feels that Coumadin could be discontinued were sinus rhythm restored.  I explained that this is not my understanding of the literature, but I believe it would be helpful for him to discuss his wife's case with an electrophysiologist and have asked Dr. Johney Frame to consult in her case.  Heart rate is effectively controlled.  Anticoagulation  has been stable and therapeutic.  Stool for Hemoccult testing was negative in 2011.  Problem # 2:  CARDIOMYOPATHY (ICD-425.4) Alicia Harrington is generally well controlled with respect to CHF except when she is at altitude.  She tolerates travel by airplane, which are pressurized to the equivalent of an altitude of approximately 7000 feet.  I've asked her to avoid spending time above that level.  Other Orders: T-Comprehensive  Metabolic Panel [phone]) T-Magnesium [phone]) T-CBC w/Diff [phone]) Cardiology Referral (Cardiology)  Patient Instructions: 1)  Your physician recommends that you schedule a follow-up appointment in: 6 months 2)  Your physician recommends that you return for lab work in: today 3)  You have been referred to Dr. Johney Frame for AF

## 2011-02-21 ENCOUNTER — Encounter: Payer: Self-pay | Admitting: Cardiology

## 2011-02-21 DIAGNOSIS — I4891 Unspecified atrial fibrillation: Secondary | ICD-10-CM | POA: Insufficient documentation

## 2011-02-25 LAB — BASIC METABOLIC PANEL
CO2: 30 mEq/L (ref 19–32)
Chloride: 103 mEq/L (ref 96–112)
Glucose, Bld: 92 mg/dL (ref 70–99)
Potassium: 3.6 mEq/L (ref 3.5–5.1)
Sodium: 141 mEq/L (ref 135–145)

## 2011-02-26 LAB — T4, FREE: Free T4: 1.33 ng/dL (ref 0.80–1.80)

## 2011-02-26 LAB — TSH: TSH: 0.769 u[IU]/mL (ref 0.350–4.500)

## 2011-02-26 LAB — CBC
Hemoglobin: 13.1 g/dL (ref 12.0–15.0)
MCHC: 34.7 g/dL (ref 30.0–36.0)
RBC: 3.56 MIL/uL — ABNORMAL LOW (ref 3.87–5.11)
WBC: 3.3 10*3/uL — ABNORMAL LOW (ref 4.0–10.5)

## 2011-03-03 LAB — COMPREHENSIVE METABOLIC PANEL
ALT: 22 U/L (ref 0–35)
Alkaline Phosphatase: 96 U/L (ref 39–117)
BUN: 19 mg/dL (ref 6–23)
CO2: 26 mEq/L (ref 19–32)
Chloride: 108 mEq/L (ref 96–112)
GFR calc non Af Amer: >60 mL/min (ref >60)
Glucose, Bld: 104 mg/dL — ABNORMAL HIGH (ref 70–99)
Potassium: 3.5 mEq/L (ref 3.5–5.1)
Sodium: 140 mEq/L (ref 135–145)
Total Bilirubin: 0.8 mg/dL (ref 0.3–1.2)

## 2011-03-03 LAB — CBC
HCT: 30.1 % — ABNORMAL LOW (ref 36.0–46.0)
Hemoglobin: 10.6 g/dL — ABNORMAL LOW (ref 12.0–15.0)
RBC: 2.9 MIL/uL — ABNORMAL LOW (ref 3.87–5.11)
RDW: 15.6 % — ABNORMAL HIGH (ref 11.5–15.5)
WBC: 3 10*3/uL — ABNORMAL LOW (ref 4.0–10.5)

## 2011-03-03 LAB — DIFFERENTIAL
Basophils Absolute: 0 10*3/uL (ref 0.0–0.1)
Basophils Relative: 1 % (ref 0–1)
Eosinophils Absolute: 0.1 10*3/uL (ref 0.0–0.7)
Monocytes Relative: 13 % — ABNORMAL HIGH (ref 3–12)
Neutro Abs: 2 10*3/uL (ref 1.7–7.7)
Neutrophils Relative %: 68 % (ref 43–77)

## 2011-03-03 LAB — IRON AND TIBC
Saturation Ratios: 43 % (ref 20–55)
TIBC: 268 ug/dL (ref 250–470)

## 2011-03-03 LAB — T4, FREE: Free T4: 1.13 ng/dL (ref 0.80–1.80)

## 2011-03-05 ENCOUNTER — Ambulatory Visit (INDEPENDENT_AMBULATORY_CARE_PROVIDER_SITE_OTHER): Payer: Medicare Other | Admitting: *Deleted

## 2011-03-05 DIAGNOSIS — Z7901 Long term (current) use of anticoagulants: Secondary | ICD-10-CM

## 2011-03-05 DIAGNOSIS — I4891 Unspecified atrial fibrillation: Secondary | ICD-10-CM

## 2011-03-10 LAB — DIFFERENTIAL
Basophils Absolute: 0 10*3/uL (ref 0.0–0.1)
Basophils Relative: 0 % (ref 0–1)
Eosinophils Relative: 0 % (ref 0–5)
Eosinophils Relative: 3 % (ref 0–5)
Lymphocytes Relative: 17 % (ref 12–46)
Monocytes Absolute: 0.6 10*3/uL (ref 0.1–1.0)
Monocytes Absolute: 0.6 10*3/uL (ref 0.1–1.0)
Monocytes Relative: 10 % (ref 3–12)
Neutro Abs: 4 10*3/uL (ref 1.7–7.7)
Neutro Abs: 7.6 10*3/uL (ref 1.7–7.7)

## 2011-03-10 LAB — CBC
HCT: 29.2 % — ABNORMAL LOW (ref 36.0–46.0)
HCT: 33 % — ABNORMAL LOW (ref 36.0–46.0)
Hemoglobin: 11 g/dL — ABNORMAL LOW (ref 12.0–15.0)
Hemoglobin: 9.8 g/dL — ABNORMAL LOW (ref 12.0–15.0)
Platelets: 311 10*3/uL (ref 150–400)
RBC: 2.92 MIL/uL — ABNORMAL LOW (ref 3.87–5.11)
RBC: 3.26 MIL/uL — ABNORMAL LOW (ref 3.87–5.11)
WBC: 8.9 10*3/uL (ref 4.0–10.5)

## 2011-03-10 LAB — URINALYSIS, ROUTINE W REFLEX MICROSCOPIC
Glucose, UA: NEGATIVE mg/dL
Hgb urine dipstick: NEGATIVE
Protein, ur: NEGATIVE mg/dL
Specific Gravity, Urine: 1.009 (ref 1.005–1.030)
pH: 6.5 (ref 5.0–8.0)

## 2011-03-10 LAB — BASIC METABOLIC PANEL
CO2: 26 mEq/L (ref 19–32)
Calcium: 8.9 mg/dL (ref 8.4–10.5)
GFR calc Af Amer: 33 mL/min — ABNORMAL LOW (ref >60)
GFR calc non Af Amer: 27 mL/min — ABNORMAL LOW (ref >60)
Glucose, Bld: 96 mg/dL (ref 70–99)
Potassium: 3.9 mEq/L (ref 3.5–5.1)
Sodium: 138 mEq/L (ref 135–145)

## 2011-03-10 LAB — COMPREHENSIVE METABOLIC PANEL
Albumin: 3.4 g/dL — ABNORMAL LOW (ref 3.5–5.2)
Alkaline Phosphatase: 115 U/L (ref 39–117)
BUN: 19 mg/dL (ref 6–23)
CO2: 29 mEq/L (ref 19–32)
Chloride: 97 mEq/L (ref 96–112)
GFR calc non Af Amer: 19 mL/min — ABNORMAL LOW (ref >60)
Potassium: 2.9 mEq/L — ABNORMAL LOW (ref 3.5–5.1)
Total Bilirubin: 0.6 mg/dL (ref 0.3–1.2)

## 2011-03-10 LAB — PROTIME-INR: Prothrombin Time: 16.1 seconds — ABNORMAL HIGH (ref 11.6–15.2)

## 2011-03-11 LAB — PROTIME-INR: INR: 3.7 — ABNORMAL HIGH (ref 0.00–1.49)

## 2011-03-24 ENCOUNTER — Encounter (HOSPITAL_COMMUNITY): Payer: Medicare Other | Attending: Oncology | Admitting: Oncology

## 2011-03-24 DIAGNOSIS — C50919 Malignant neoplasm of unspecified site of unspecified female breast: Secondary | ICD-10-CM

## 2011-03-24 DIAGNOSIS — I4891 Unspecified atrial fibrillation: Secondary | ICD-10-CM | POA: Insufficient documentation

## 2011-03-24 DIAGNOSIS — Z79899 Other long term (current) drug therapy: Secondary | ICD-10-CM | POA: Insufficient documentation

## 2011-03-24 DIAGNOSIS — Z853 Personal history of malignant neoplasm of breast: Secondary | ICD-10-CM | POA: Insufficient documentation

## 2011-03-24 DIAGNOSIS — Z09 Encounter for follow-up examination after completed treatment for conditions other than malignant neoplasm: Secondary | ICD-10-CM | POA: Insufficient documentation

## 2011-03-24 DIAGNOSIS — Z7901 Long term (current) use of anticoagulants: Secondary | ICD-10-CM | POA: Insufficient documentation

## 2011-03-25 ENCOUNTER — Ambulatory Visit (INDEPENDENT_AMBULATORY_CARE_PROVIDER_SITE_OTHER): Payer: Medicare Other | Admitting: *Deleted

## 2011-03-25 DIAGNOSIS — I4891 Unspecified atrial fibrillation: Secondary | ICD-10-CM

## 2011-03-25 LAB — POCT INR: INR: 3.6

## 2011-03-26 ENCOUNTER — Encounter: Payer: Self-pay | Admitting: Internal Medicine

## 2011-04-02 ENCOUNTER — Encounter: Payer: Self-pay | Admitting: Internal Medicine

## 2011-04-02 ENCOUNTER — Ambulatory Visit (INDEPENDENT_AMBULATORY_CARE_PROVIDER_SITE_OTHER): Payer: Medicare Other | Admitting: *Deleted

## 2011-04-02 ENCOUNTER — Ambulatory Visit (INDEPENDENT_AMBULATORY_CARE_PROVIDER_SITE_OTHER): Payer: Medicare Other | Admitting: Internal Medicine

## 2011-04-02 DIAGNOSIS — I10 Essential (primary) hypertension: Secondary | ICD-10-CM

## 2011-04-02 DIAGNOSIS — I428 Other cardiomyopathies: Secondary | ICD-10-CM

## 2011-04-02 DIAGNOSIS — I4891 Unspecified atrial fibrillation: Secondary | ICD-10-CM

## 2011-04-02 NOTE — Patient Instructions (Signed)
Your physician recommends that you schedule a follow-up appointment in: as needed  

## 2011-04-02 NOTE — Progress Notes (Signed)
Alicia Harrington is a pleasant 77 y.o. WF patient with a h/o longstanding persistent atrial fibrillation who presents today for EP consultation.  She reports initially being diagnosed with atrial fibrillation around 2004.  She reports that she was at [facility] and developed afib.  She was placed on coumadin.  She was evaluated by Dr Dietrich Pates and underwent cardioversion 07/07/05.  She reports that her exercise tolerance improved with cardioversion.  She was placed on flecainide and toprol.  She maintained sinus rhythm for a year or so and then developed recurrent atrial fibrillation.  She underwent repeat cardioversion but returned to atrial fibrillation several weeks later.  Her exercise tolerance and energy may have moderately improved, though she is not completely sure of this retrospectively.  She was started on amiodarone and returned for cardioversion.  Unfortunately, she returned to afib within several weeks.  She has been treated with rate control with digoxin and anticoagulation with coumadin since that time.  She reports doing reasonably well with rate control.  She reports occasional fatigue and SOB but remains very active for her age.  She and her spouse continue to travel and lead normal lives.  She has been treated by Dr Crosby Oyster for nonischemic CM previously (though her most recent echo from 2010 reveals normalization of her EF).  Today, she denies symptoms of palpitations, chest pain, orthopnea, PND, dizziness, presyncope, syncope, or neurologic sequela.  She reports occasional edema. The patient is tolerating medications without difficulties and is otherwise without complaint today.   Past Medical History  Diagnosis Date  . 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 Pulmonary edema 2009  . Atrial fibrillation     sick sinus syndrome ; first degree AV block  . Chronic kidney disease (CKD), stage I   . Hypertension   .  Adenocarcinoma, breast     treated with lumpectomy, node dissection, chemo and RT  . Hearing impairment   . DJD (degenerative joint disease)     right knee and lumbosacral spine  . History of angina     normal cornary arteries in 2001   Past Surgical History  Procedure Date  . Appendectomy   . Dilation and curettage of uterus   . Left middle ear surgery   . Tonsillectomy   . Excision of breast carcinoma 2009    right breast  . Microdiscectomy lumbar 2009    lumbosacral spine    Current Outpatient Prescriptions  Medication Sig Dispense Refill  . digoxin (LANOXIN) 0.25 MG tablet Take 250 mcg by mouth daily. 1 tab MWF, 1/2 tab all other days       . furosemide (LASIX) 20 MG tablet Take 20 mg by mouth as needed. Take as needed 20 to 40 mg       . metoprolol succinate (TOPROL-XL) 25 MG 24 hr tablet Take 25 mg by mouth daily.        . Omega-3 Fatty Acids (FISH OIL) 1000 MG CAPS Take 1,000 mg by mouth daily.        . potassium chloride SA (KLOR-CON M20) 20 MEQ tablet Take 20 mEq by mouth as needed.        . tamoxifen (NOLVADEX) 20 MG tablet Take 20 mg by mouth daily.        . valsartan (DIOVAN) 80 MG tablet Take 80 mg by mouth daily.        Marland Kitchen warfarin (COUMADIN) 5 MG tablet Take  by mouth as directed.        Marland Kitchen DISCONTD: fenoprofen (NALFON) 600 MG TABS Take 600 mg by mouth daily.          No Known Allergies  History   Social History  . Marital Status: Married    Spouse Name: N/A    Number of Children: N/A  . Years of Education: N/A   Occupational History  .      retired   Social History Main Topics  . Smoking status: Former Games developer  . Smokeless tobacco: Not on file   Comment: quit at age 33  . Alcohol Use: Yes     one glass of wine per day  . Drug Use: No  . Sexually Active: Not on file   Other Topics Concern  . Not on file   Social History Narrative   Pt lives in [place] with spouse.  Retired Engineer, civil (consulting).  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    Family History  Problem  Relation Age of Onset  . Heart failure Mother   . Sudden death Father 37    ROS- All systems are reviewed and negative except as per the HPI above  Physical Exam: Filed Vitals:   04/02/11 1428  BP: 116/72  Pulse: 79  Height: 5\' 1"  (1.549 m)  Weight: 116 lb (52.617 kg)    GEN- The patient is well appearing, alert and oriented x 3 today.   Head- normocephalic, atraumatic Eyes-  Sclera clear, conjunctiva pink Ears- hearing intact Oropharynx- clear Neck- supple, no JVP Lymph- no cervical lymphadenopathy Lungs- Clear to ausculation bilaterally, normal work of breathing Heart- irregular rate and rhythm, no murmurs, rubs or gallops,  GI- soft, NT, ND, + BS Extremities- no clubbing, cyanosis, +1 ble edema MS- no significant deformity or atrophy Skin- no rash or lesion Psych- euthymic mood, full affect Neuro- strength and sensation are intact  EKG today reveals atrial fibrillation, V rate 79 bpm, poor R wave progression, QRS 70 ms, Qtc 417, LAD, otherwise normal ekg  Assessment and Plan:

## 2011-04-06 ENCOUNTER — Encounter: Payer: Self-pay | Admitting: Internal Medicine

## 2011-04-06 NOTE — Assessment & Plan Note (Signed)
Ms Bogie has longstanding persistent atrial fibrillation.  She has failed medical therapy with flecainide and amiodarone.  She has had a good response with rate control.  She is also appropriately anticoagulated with coumadin (CHADS2 score is at least 3- age >21, HTN, and CHF).  She is minimally symptomatic and remains active at this time.  Therapeutic strategies for afib including medicine and ablation were discussed in detail with the patient and her husband today. Risk, benefits, and alternatives to EP study and radiofrequency ablation for afib were also discussed in detail today.  The patient is aware that we have to data to suggest that catheter ablation is an alternative to anticoagulation.  The sole reason to pursue catheter ablation is for relief of symptoms of afib.   We discussed the AFFIRM trial which supported rate control longterm as a valid option for patients with minimally symptomatic afib. At this time, I would recommend ongoing rate control and anticoagulation with either coumadin or a novel anticoagulant  (pradaxa/ xarelto).  I think that her anticipated success with catheter ablation is on the order of 60% with a high likelihood of requiring repeat procedures. The patient and her spouse are in agreement with continuing the present strategy.  They will contemplate switching from coumadin to a novel agent and will discuss further with Dr Dietrich Pates. I will see the patient as needed.

## 2011-04-06 NOTE — Assessment & Plan Note (Signed)
Stable No change required today  

## 2011-04-06 NOTE — Assessment & Plan Note (Signed)
Improved with medical therapy No changes today She will continue to follow closely with Dr Dietrich Pates.

## 2011-04-07 ENCOUNTER — Encounter: Payer: Self-pay | Admitting: Internal Medicine

## 2011-04-08 NOTE — Assessment & Plan Note (Signed)
[facility]                       [place] [facility] OFFICE NOTE   Alicia, Harrington                   MRN:          [id number]  DATE:10/26/2007                            DOB:          10-Feb-1932    [name] is seen in the office today at the request of her husband.  She developed an episode of orthopnea or PND 2 nights ago, prompting his  call to the office for urgent assessment.  She recovered spontaneously  back to her usual class II dyspnea on exertion.  She has had  hydrochlorothiazide at home, but did not think to take it, and does not  know exactly where it is.  She did take some cough syrup due to an  associated cough and rattling respiratory noises without sputum  production.  She returned to bed after a period of decreased  breathlessness and slept on 2 pillows.  Over the course of 24 hours, her  symptoms gradually faded.  At the present time, she feels well.  She had  a chest x-ray yesterday that was unremarkable.  Chemistry profile and  CBC were also unremarkable.  BNP level was 182.   MEDICATIONS:  As listed in the chart.   PHYSICAL EXAMINATION:  GENERAL:  A pleasant woman looking to be her  usual healthy self.  VITAL SIGNS:  The weight is 138, 4 pounds less than in July.  Blood  pressure 130/75, heart rate 60 and regular, respirations 16.  NECK:  No jugular venous distention; normal carotid upstrokes without  bruits.  LUNGS:  Clear.  CARDIAC:  Normal first and second heart sounds.  Minimal systolic  murmur.  ABDOMEN:  Soft and nontender.  No organomegaly.  EXTREMITIES:  Trace edema.   IMPRESSION:  [name] may have had congestive heart failure that  resolved spontaneously.  This, in turn, may have been due to excess salt  intake at holiday dinners.  She is instructed to seek medical attention  should such symptoms recur.  We will give her furosemide 20 mg p.r.n.  for dyspnea or edema.  She is traveling to  [place] for 6 weeks and  will arrange for a pro time  in a few weeks to be transmitted back to  [place].  I will see this nice woman again as previously planned.     Gerrit Friends. Dietrich Pates, MD, [facility]  Electronically Signed    RMR/MedQ  DD: 10/26/2007  DT: 10/26/2007  Job #: [id number]

## 2011-04-08 NOTE — H&P (Signed)
Alicia Harrington, Alicia Harrington            ACCOUNT NO.:  [id number]   MEDICAL RECORD NO.:  [id number]          PATIENT TYPE:  INP   LOCATION:  A304                          FACILITY:  [facility]   PHYSICIAN:  Margaretmary Dys, M.D.DATE OF BIRTH:  1932-07-27   DATE OF ADMISSION:  11/15/2008  DATE OF DISCHARGE:  LH                              HISTORY & PHYSICAL   ADMISSION DIAGNOSES:  1. Neutropenic fever.  2. Dehydration.  3. History of breast cancer on chemotherapy by Dr. Mariel Sleet.  4. Severe neutropenia.   CHIEF COMPLAINT:  Fever with neutropenia.  The patient is transferred  from Dr. Thornton Papas office.Marland Kitchen   HISTORY OF PRESENT ILLNESS:  Alicia Harrington is a 77 year old female  patient of Dr. Thornton Papas.  The patient has a history of breast cancer  and is currently on chemotherapy with Dr. Mariel Sleet.   The patient has lymph node positive breast cancer.   The patient has been getting some chemotherapy and doing fairly well  until a few years ago when she noticed that she was breaking out in  sweats and she measured her  temperature last and it was 101 degrees  Fahrenheit.  Two days ago, white blood cell count was measured and the  patient was noted to be neutropenic but since she was asymptomatic with  no fevers and it was felt that the patient  could be managed as an  outpatient.  However, the patient presented today and her absolute  neutrophil count is actually worse.  The patient also reported a fever  last night of 101 degrees Fahrenheit.  The patient has some nausea but  no vomiting.  She denies any abdominal pain.  She does have some sore  throat but the rapid strep test was negative.  She has some cough but no  sputum production.  No pleuritic chest pain, no angina pain.  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  The patient reports no trouble  sleeping.  The patient has also had some rashes on her skin which  were  thought to be secondary to her chemotherapy.  I was then called by Dr.  Mariel Sleet to admit the patient directly to the hospital for management  of neutropenic fever.   REVIEW OF SYSTEMS:  As mentioned history of present illness above.   PAST FAMILY PSYCHIATRIC HISTORY:  1. History of breast cancer.  2. Congestive heart failure with pulmonary edema and ejection fraction      45%  3. Hypertension.  4. Atrial fibrillation.   MEDICATIONS:  1. Warfarin as directed by the INR.  2. Amlodipine 5 mg once a day.  3. Toprol 75 mg once a day.  4. Fish oil capsule b.i.d.  5. Celebrex 100 mg p.o. b.i.d..   ALLERGIES:  The patient reports no known drug allergies.   FAMILY HISTORY:  Mother had a history of rheumatic heart disease and  died at age of 37.  One brother had a cerebrovascular accident.  No  family history of coronary disease, arrhythmia or cancer.   SOCIAL HISTORY:  The  patient is a life long nonsmoker.  Does not drink  alcohol.  Married to retired Development worker, community.  Lives locally.  She is also a  retired Engineer, civil (consulting).   PHYSICAL EXAMINATION:  The patient was conscious, alert, pleasant, not  in acute distress, well oriented in time, place, and person.  VITAL SIGNS: Her blood pressure was 98/48 with a pulse of 95,  respiratory rate was 20, temperature 98.4 degrees Fahrenheit oxygen  saturation was 97% on room air.  HEENT:  Normocephalic, atraumatic.  Oral mucosa was dry.  No exudates  were noted.  NECK:  Supple.  No JVD or lymphadenopathy.  LUNGS:  Reduced air entry bilaterally.  No crackles, wheezing or rhonchi  were heard.  HEART:  S1-S2 regular.  No S3 or S4 gallop or  rubs.  ABDOMEN:  Soft, nontender.  Bowel sounds positive.  No masses palpable.  EXTREMITIES:  No pitting pedal edema.  No calf induration or tenderness  noted.  SKIN:  Exam shows some petechiae and excoriations of her extremities.   LABORATORY/DIAGNOSTIC DATA:  White blood cell count was 0.5, hemoglobin  9.1,  hematocrit 26.9, platelet count was 116, neutrophils of 15%,  absolute neutrophil count was 100.  PT was 29.2, INR was 2.6.  Chest x-  ray shows no acute cardiopulmonary disease.  Blood cultures are pending.  Urinalysis is also pending.  Two days ago, her sodium level was 135,  potassium 3.9, chloride of 102, CO2 was 26, glucose was 113, BUN of 12,  creatinine was 0.99, AST of 24, ALT of 19, calcium was 8.6.   ASSESSMENT/PLAN:  Alicia Harrington is a 77 year old female with history of  the breast cancer on chemotherapy.  The patient was transferred from Dr.  Thornton Papas office as a direct admission due to severe neutropenia with  fevers.   PLAN:  1. The patient will be admitted to the medical floor.  2. Will initiate antibiotic therapy with vancomycin and Zosyn.   Will continue the patient's warfarin for her atrial fibrillation and  also will start with DVT prophylaxis.  Her INR is therapeutic.  We will  initiate the patient on Neupogen subcutaneously to help improve her  white blood cell count.   The patient will be on neutropenic precautions.   GI prophylaxis will be with Protonix.  I will hold the patient's blood  pressure medications at this time as I believe she is dehydrated and  will watch what the trend is as she becomes more hydrated.   Will request that Dr. Mariel Sleet to also follow with [place] in the hospital.   The patient remains mostly stable at this time and we will continue to  monitor her closely.      Margaretmary Dys, M.D.  Electronically Signed     AM/MEDQ  D:  11/15/2008  T:  11/15/2008  Job:  [id number]   cc:   Ladona Horns. Mariel Sleet, MD  Fax: [phone]

## 2011-04-08 NOTE — Discharge Summary (Signed)
Alicia Harrington, Alicia Harrington NO.:  [id number]   MEDICAL RECORD NO.:  [id number]          PATIENT TYPE:  INP   LOCATION:  3526                         FACILITY:  [facility]   PHYSICIAN:  Cristi Loron, M.D.DATE OF BIRTH:  07-30-1932   DATE OF ADMISSION:  08/10/2008  DATE OF DISCHARGE:  08/10/2008                               DISCHARGE SUMMARY   BRIEF HISTORY:  The patient is a 77 year old white female who presented  with left leg pain.  She failed medical management and was worked up  with lumbar MRI, which demonstrated she had a herniated disk at L3-4.  I  discussed the various treatment options with her including surgery.  She  weighed the risks, benefits, and alternatives of surgery and decided to  proceed with a L3-4 diskectomy.   For further details of this admission, please refer to typed history and  physical.   HOSPITAL COURSE:  We admitted the patient to [facility] on  August 10, 2008.  On the day of admission, performed a left L3  hemilaminectomy to decompress the left L3 and L4 nerve roots.  Surgery  went well (for full details of this operation, please refer to typed  operative note).   POSTOPERATIVE COURSE:  The patient's postoperative course was  unremarkable, and she was discharged home on the day of admission, i.e.  August 10, 2008.   DISCHARGE INSTRUCTIONS:  The patient was given written discharge  instructions and instructed to follow up with me in 4 weeks.   DISCHARGE PRESCRIPTIONS:  1. Dilaudid 4 mg, #50 one p.o. q.4 h. p.r.n. for pain.  2. Valium 5 mg, #50, one p.o. q.8 h. p.r.n. muscle spasms.   FINAL DIAGNOSES:  Left L3-4 herniated nucleus pulposus, left L3 and L4  radiculopathy.   PROCEDURE PERFORMED:  Left L3 hemilaminectomy for decompression of left  L3 and L4 nerve roots using microdissection.      Cristi Loron, M.D.  Electronically Signed     JDJ/MEDQ  D:  09/07/2008  T:  09/08/2008  Job:  [id number]

## 2011-04-08 NOTE — Assessment & Plan Note (Signed)
[facility]                       [place] OFFICE NOTE   Alicia, Harrington                   MRN:          [id number]  DATE:02/02/2008                            DOB:          August 21, 1932    Alicia Harrington returns to the office for continued assessment and  treatment of hypertension and paroxysmal atrial fibrillation.  She has  done well since her last visit a few months ago.  She has had no  palpitations.  [place].  She took  furosemide a few days while she was there, but did not develop any  significant dyspnea nor pedal edema.  She notes that she has chronic  hearing loss in the left ear and now has some low-frequency loss on the  right for which she has been given a hearing aid, which is of  questionable benefit.   CURRENT MEDICATIONS:  1. Include warfarin as directed with stable therapeutic      anticoagulation.  2. Amlodipine 5 mg daily.  3. Fish oil 1 b.i.d.  4. Toprol 25 mg daily.  5. Flecainide 100 mg b.i.d.  6. Celebrex 200 mg daily.   EXAM:  Pleasant trim woman in no acute distress.  The weight is 139, stable.  Blood pressure 125/70, heart rate 55 and  regular, respirations 18.  NECK:  No jugular venous distention; no carotid bruits.  LUNGS:  Clear.  CARDIAC:  Normal first heart sounds; accentuated second heart sounds; no  murmur nor gallop appreciated.  ABDOMEN:  Soft and nontender.  EXTREMITIES:  No edema.   Last chemistry profile and CBC were in December and were fine.  BNP at  that time was 182.  Stool for hemoccult was negative in July of last  year.  Alicia Harrington is doing quite well overall.  We will continue her  current medications and plan to see her again in 8 months.  She will be  followed in Anticoagulation Clinic in the interim.     Gerrit Friends. Dietrich Pates, MD, [facility]  Electronically Signed    RMR/MedQ  DD: 02/02/2008  DT: 02/03/2008  Job #: [id number]

## 2011-04-08 NOTE — Op Note (Signed)
Alicia Harrington, Alicia Harrington            ACCOUNT NO.:  [id number]   MEDICAL RECORD NO.:  [id number]          PATIENT TYPE:  INP   LOCATION:  3526                         FACILITY:  [facility]   PHYSICIAN:  Cristi Loron, M.D.DATE OF BIRTH:  Aug 25, 1932   DATE OF PROCEDURE:  08/10/2008  DATE OF DISCHARGE:  08/10/2008                               OPERATIVE REPORT   PREOPERATIVE DIAGNOSES:  L3 herniated disk, right L3 and L4 lumbar  radiculopathy, lumbago.   POSTOPERATIVE DIAGNOSES:  L3 herniated disk, right L3 and L4 lumbar  radiculopathy, lumbago.   PROCEDURE:  Right L3 hemilaminectomy to decompress the right L3 and L4  nerve roots using microdissection.   SURGEON:  Cristi Loron, MD   ASSISTANT:  Coletta Memos, MD   ANESTHESIA:  General endotracheal.   ESTIMATED BLOOD LOSS:  25 mL.   SPECIMENS:  None.   DRAINS:  None.   COMPLICATIONS:  None.   BRIEF HISTORY:  The patient is a 77 year old white female who was  recently diagnosed with breast cancer.  She has initially been treated  with chemotherapy with this.  She developed severe back and left leg  pain.  She failed medical management and was worked up with lumbar CT  which demonstrated the patient had a herniated disk behind the L3  vertebral body compressing the L3 and L4 nerves.  I discussed various  treatment options with her including surgery.  The patient has weighed  the risks, benefits, and alternatives of surgery, and decided to proceed  with a laminectomy at L3, to decompress the L3 and L4 nerve roots.  We  preoperatively got clearance from Dr. Mariel Sleet regarding her recent  chemotherapy.   DESCRIPTION OF PROCEDURE:  The patient was brought to the operating room  by the anesthesia team.  General endotracheal anesthesia was induced.  The patient was turned to the prone position on the Wilson frame.  Her  lumbosacral region was then prepared with Betadine scrub and Betadine  solution.  Sterile drapes were  applied.  I then injected the area to be  incised with Marcaine with epinephrine solution.  I used a scalpel to  make a linear midline incision over the L3-4 interspace.  I used  electrocautery to perform a left-sided subperiosteal dissection exposing  the spinous process and lamina of L3.  We obtained intraoperative  radiographs to confirm our location and then inserted McCullough  retractor for exposure.   We then brought the operative microscope into the field and under its  electromagnification and illumination, we completed the  microdissection/decompression.  Using a high-speed drill performed a  left L3 laminotomy.  I completed the left L3 hemilaminotomy using  Kerrison punch, removed the ligamentum flavum at L3-4 and L2-3.  This  gave a good exposure of the thecal sac.  We carefully used  microdissection to free up the thecal sac and the L3 and L4 nerve root  from the epidural tissue.  Dr. Franky Macho then gently retracted the thecal  sac medially with a D'Errico retractor.  This exposed a large area of  disk herniation which appeared to have migrated in  cephalad direction  from the L3-4 interspace and was compressing both the L4 and even more  so the L3 nerve root as it exited the neural foramen.  We removed the  disk herniation and multiple fragments using the micropituitary forceps.  I then palpated along the ventral surface of thecal sac along the exit  route of the L3 and L4 nerve roots and noted that they were well  decompressed.  It should note that we did remove some disk fragments out  of the neural foramen at L3-4.  At this moment I had good decompression  of the nerve roots and thecal sac.  We obtained hemostasis using bipolar  cautery and Gelfoam.  We irrigated the wound out with bacitracin  solution.  We removed the retractors and then reapproximated the  patient's thoracolumbar fascia with interrupted #1 Vicryl suture,  subcutaneous tissue with 2-0 Vicryl suture, and  the skin with Steri-  Strips and benzoin.  The wound was then coated with bacitracin ointment.  A sterile dressing was applied.  The drapes were removed and the patient  was returned to the supine position where she was extubated by the  anesthesia team and transported to the postanesthesia care unit in  stable condition.  All sponge, instrument, and needle counts were  correct at the end of the case.      Cristi Loron, M.D.  Electronically Signed     JDJ/MEDQ  D:  08/10/2008  T:  08/11/2008  Job:  [id number]

## 2011-04-08 NOTE — Assessment & Plan Note (Signed)
[facility]                       [place] CARDIOLOGY OFFICE NOTE   KHAILA, VELARDE                   MRN:          [id number]  DATE:11/13/2008                            DOB:          [birth year]    Ms. Criscuolo came to the office today for protime.  She stated that she  just felt weak and is going through a lot currently with chemotherapy  for breast cancer.   She is diagnosed with atrial fibrillation and was recently started on  amiodarone by Dr. Dietrich Pates.  She is currently on 2 tablets a day.   She is also on Toprol-XL 50 mg per day, which she took this morning.  She is on Lasix 40 mg a day, which she did not take this morning.  She  is also on Coumadin and Diovan 80 mg per day.   She was slightly shaky in the office today.  She is a little bit  emotional.   PHYSICAL EXAMINATION:  VITAL SIGNS:  Her blood pressure is 110/68, her  pulse is 110-120, and was confirmed by rhythm strip.  Respiration is 18  and unlabored.  GENERAL:  She is in no acute distress.  SKIN:  Color is normal.  NECK:  No significant JVD.  Carotids are full.  LUNGS:  Clear.  HEART:  Irregular rate and rhythm.  ABDOMEN:  Soft.  EXTREMITIES:  Trace to 1+ pitting edema at the ankles.  Pulses are  present.  NEUROLOGIC:  Intact.   She also states she had a slight nosebleed this morning, it was not  significant.   INR today is 2.1.   ASSESSMENT AND PLAN:  Ms. Dula has impersistent atrial fibrillation  with a rapid ventricular rate.  This is despite taking her Toprol this  morning 50 mg.  She is being loaded with amiodarone.   PLAN:  1. Increase Toprol to 75 mg p.o. q.a.m.  She will take an additional      25 mg when she gets home today.  2. Take Lasix this afternoon when she gets home.  3. Continue amiodarone load.  4. See me back in 2 days for heart rate.  Hopefully, get her heart      rate below 100.    Thomas C. Daleen Squibb, MD, [facility]  Electronically  Signed   TCW/MedQ  DD: 11/13/2008  DT: 11/13/2008  Job #: [id number]

## 2011-04-08 NOTE — Assessment & Plan Note (Signed)
[facility]                       [place] CARDIOLOGY OFFICE NOTE   Alicia Harrington, Alicia Harrington                   MRN:          [id number]  DATE:11/15/2008                            DOB:          Jul 09, 1932    [name] comes in today for careful followup.  I saw her on Monday,  at which time, she was in atrial fib with a rapid ventricular rate.  She  has an ejection fraction of 35-40% by echo, October 09, 2008.   Her INR was therapeutic.  I increased her Toprol to 75 mg per day from  50 mg per day.   She continues with her amiodarone load at 400 mg a day.  Next, we should  go to 100 mg a day.  She will be arranged for an outpatient  cardioversion by Dr. Donnamarie Rossetti on November 24, 2008, once she has been  therapeutic on INR for 3 weeks or greater.   From her heart standpoint, she feels better.  She has a lot of tough  questions going on about continuing chemotherapy, which she related to  me today.  I have also spoken to Dr. Glenford Peers of Oncology.   Her exam today is remarkable for heart rate of 76 but still irregular.  Her blood pressure is normal at 120/60, her weight is stable 131.  She  is in no acute distress.  Lungs are clear.  Heart reveals irregular rate  and rhythm, but a well-controlled rate.  Extremities reveal trace edema.  Pulses are present.   [name] heart rate has slowed down to a good level.  We will  continue with the 75 mg of Toprol per day and continue her amiodarone  load.  As mentioned above, we will arrange for a DC cardioversion once  her INR is therapeutic for 3 weeks sometime in the first or second week  of January.     Thomas C. Daleen Squibb, MD, [facility]  Electronically Signed    TCW/MedQ  DD: 11/15/2008  DT: 11/15/2008  Job #: [id number]   cc:   Ladona Horns. Mariel Sleet, MD

## 2011-04-08 NOTE — Group Therapy Note (Signed)
[name], Alicia Harrington            ACCOUNT NO.:  [id number]   MEDICAL RECORD NO.:  [id number]          PATIENT TYPE:  INP   LOCATION:  IC04                          FACILITY:  [facility]   PHYSICIAN:  Osvaldo Shipper, MD     DATE OF BIRTH:  04-Sep-1932   DATE OF PROCEDURE:  09/14/2008  DATE OF DISCHARGE:                                 PROGRESS NOTE   SUBJECTIVE:  Subjectively, the patient is lying comfortably on the bed  in no apparent distress.  She denies any chest pain or shortness of  breath.   PHYSICAL EXAMINATION:  VITAL SIGNS:  Her vital signs continue stable.  Heart rate in the 90s and regular.  Blood pressure 120/66, respiratory  above 16 breaths per minute and saturation 90% on 2 liters by nasal  cannula.  She was negative 740 mL yesterday.  So far she has been  negative for 4.2 liters.  GENERAL:  Elderly white female in no distress.  HEENT:  There is no pallor, no icterus.  Mucous membranes moist.  No  forehead lesions are noted.  NECK:  Soft and supple.  No thyromegaly is appreciated.  LUNGS:  A few crackles at the bases; otherwise mostly clear.  CARDIOVASCULAR:  S1 and S2 normal, irregularly irregular.  No murmurs  appreciated.  She had no S3 or S4, no rubs.  No bruits.  ABDOMEN:  Abdomen is soft, nontender, nondistended.  Bowel sounds  present.  No mass or organomegaly is appreciated.  EXTREMITIES:  Minimal edema bilaterally in the lower extremities.  NEUROLOGIC:  She is alert, oriented x3.  No focal neurological deficits  are present.  MUSCULOSKELETAL:  Unremarkable.   LABORATORY DATA:  White count 3.2, hemoglobin 9.9, MCV 108, platelet  count 182,000.  INR today is 3.6.  Potassium 3.3.  BNP 84.8.   ASSESSMENT/PLAN:  1. Congestive heart failure with pulmonary edema.  The patient's      ejection fraction has reduced to 40-45% from  55% in September      2009.  The patient went into pulmonary edema and presented with      shortness of breath.  She was on IV Lasix and she  has improved      remarkably.  We will discontinue the IV Lasix and start her on p.o.      daily dose of Lasix.  We will defer to cardiology regarding      initiation of an ACE inhibitor.  We will also defer to them if they      want to change the current dose of Lasix.  She is currently on      Coumadin for atrial fibrillation and she is on beta-blockers.  She      is on flecainide as well.  2. History of breast cancer. She received chemotherapy from Dr.      Mariel Sleet, oncologist.  We will defer to him when she is      discharged.  3. Hypertension.  She is on Norvasc.  4. History of ruptured back, stable.  She has undergone a      neurosurgical procedure recently.  5. Hypokalemia.  Will be repleted this morning. Her magnesium was 2.1      yesterday.  6. Low TSH.  Awaiting her TSH and free T4 later this morning.   OTHER PERTINENT LABS:  Her cholesterol panel came back and LDL was 90,  total cholesterol of 143, HDL of 41.  Defer to cardiology to initiate  treatment.   So at this point we are basically awaiting cardiology recommendations.  Dr. Dietrich Pates mentioned that the patient may require another cardiac cath  depending on the findings of the echocardiogram.      Osvaldo Shipper, MD  Electronically Signed     GK/MEDQ  D:  09/14/2008  T:  09/14/2008  Job:  [id number]   cc:   Gerrit Friends. Dietrich Pates, MD, [facility]  [address]  [place], [place] [address]

## 2011-04-08 NOTE — Discharge Summary (Signed)
Alicia Harrington, Alicia Harrington            ACCOUNT NO.:  [id number]   MEDICAL RECORD NO.:  [id number]          PATIENT TYPE:  INP   LOCATION:  A304                          FACILITY:  [facility]   PHYSICIAN:  Skeet Latch, DO    DATE OF BIRTH:  1932-10-01   DATE OF ADMISSION:  11/15/2008  DATE OF DISCHARGE:  12/28/2009LH                               DISCHARGE SUMMARY   ONCOLOGIST:  Ladona Horns. Neijstrom, MD   DISCHARGE DIAGNOSES:  1. Neutropenia, resolved.  2. History of breast cancer.  3. Hypokalemia, resolved.  4. Acute pharyngitis, improving.  5. History of atrial fibrillation.  6. History of congestive heart failure with pulmonary edema.   BRIEF HOSPITAL COURSE:  This is a 77 year old white female who has  history of breast cancer on chemotherapy by Dr. Mariel Sleet who, after  getting chemo, started breaking out in sweats and was found to have a  temperature of 101 degrees.  Two days prior, her white count was  measured and the patient was noted to be neutropenic.  She was  asymptomatic and had no fevers.  However, the patient had a repeat and  it was actually worse.  The patient had no nausea or vomiting, no  abdominal pain.  She was complaining of a sore throat.  She had a slight  cough, but no sputum production.  The patient had no chest pain.  Dr.  Mariel Sleet called the hospitalist for a direct admission for management  of her neutropenia.  The patient was admitted and placed on some of her  home medications.  She was initiated on antibiotic therapy with  vancomycin and Zofran.  She was kept on her Warfarin for atrial  fibrillation and started on DVD prophylaxis.  The patient was also  started on Neupogen.  The patient's neutropenia resolved, but she was  kept on neutropenic precautions, however.  The patient was doing fairly  well.  The patient did have an episode of shortness of breath.  Lasix  was given via IV, then p.o. and the patient's shortness of breath had  improved.  The  patient was receiving breathing treatments as needed for  any dyspnea.  The patient was also having some diarrhea.  She was placed  on Imodium and her diarrhea dramatically improved at this time.  After  speaking with Dr. Mariel Sleet, we felt the patient could be sent home at  this time and he will see the patient immediately after discharge in his  office.   RADIOLOGIC STUDIES PERFORMED:  Initial chest x-ray showed no acute  coronary or pulmonary disease.  A repeat chest x-ray done on November 19, 2008 showed a small pleural effusion.   DISCHARGE MEDICATIONS:  1. Lasix 20 mg daily.  2. Coumadin 5 mg daily and this needs to be adjusted by the primary      care physician.  3. Prilosec 20 mg daily.  4. Multivitamin daily.  5. Ativan, previous dose, as needed.  6. We will defer to Dr. Mariel Sleet regarding her Decadron with      chemotherapy.  7. The patient is to take Zofran also as  needed.  8. Amiodarone 400 mg once a day.   VITALS ON DISCHARGE:  Temperature is 97.6, respirations 21, heart rate  84, blood pressure 108/55, saturating 94% on room air.   LABS ON DISCHARGE:  Hemoglobin is 8.6, hematocrit 26.3, white count  30,300, platelet count 228.  Sodium is 142, potassium 3.5, chloride 112,  CO2 of 22, BUN 3, creatinine 1.32, glucose 77.  PT is 41.1, INR 3.8,  calcium 8.0.  BNP is 572.   CONDITION AT DISCHARGE:  Stable.   DISPOSITION:  The patient will be discharged home with husband and  family.   DISCHARGE INSTRUCTIONS:  The patient is to maintain a low-salt, heart-  healthy diet.  She is to increase her activity slowly.  Patient to  follow with Dr. Mariel Sleet today immediately after discharge in his  office.  The patient is to take medications as directed.  The patient is  to return to the emergency department if she has any similar symptoms  with extreme weakness, uncontrolled diarrhea or chest pain and/or call  911.      Skeet Latch, DO  Electronically  Signed     SM/MEDQ  D:  11/20/2008  T:  11/20/2008  Job:  [id number]   cc:   Ladona Horns. Mariel Sleet, MD  Fax: [phone]

## 2011-04-08 NOTE — Assessment & Plan Note (Signed)
[facility]                       [place] CARDIOLOGY OFFICE NOTE   AMANDAJO, GONDER                   MRN:          [id number]  DATE:10/23/2008                            DOB:          [birth year]    Alicia Harrington returns to the office for continued assessment and  treatment of cardiomyopathy and paroxysmal atrial fibrillation.  Since  her last visit, she has noted increasing dyspnea on exertion.  She has  increased furosemide from 20 mg a day to 40 mg a day for pedal edema,  which now resolved.  Weight has been stable.  She does not note  palpitations.  She has no orthopnea nor PND.  She has at least on one  occasion, developed dyspnea after chemotherapy, which was attributed to  the fluid load.   Current medications include:  1. Warfarin as directed.  2. Fish oil 1 b.i.d.  3. Toprol 50 mg daily.  4. Lasix 40 mg daily.  5. Diovan 80 mg daily.  6. She stopped amlodipine as blood pressure has decreased.   PHYSICAL EXAMINATION:  GENERAL:  Pleasant woman in no acute distress.  VITAL SIGNS:  The weight is 132, 5 pounds less than 1 month ago.  Blood  pressure 95/65, heart rate 110 and irregular, respirations 14.  NECK:  No jugular venous distention.  LUNGS:  Clear.  CARDIAC:  Irregular rhythm, normal first and second heart sounds, rapid  heart rate.  ABDOMEN:  Soft and nontender.  No organomegaly.  EXTREMITIES:  No edema.   IMPRESSION:  Alicia Harrington has poor exercise tolerance due to  cardiomyopathy, atrial fibrillation, a rapid heart rate, and additional  comorbidities including anemia and chemotherapy.  She does not appear to  have congestive heart failure at rest.  A chest x-ray will be obtained  as well as a chemistry profile and BNP level.  It will be necessary to  attempt to maintain sinus rhythm and to control her heart rate.  Amiodarone will be started on a dose of 400 mg b.i.d. for 2 weeks, then  400 mg daily, then 200 mg daily.   She will be seen by Cardiology nurses  on a weekly basis for rhythm strips, assessment of heart rate, blood  pressure and symptoms, and  close monitoring of anticoagulation.  I will plan a return office visit  in 1 month at which time, cardioversion can hopefully be scheduled.     Gerrit Friends. Dietrich Pates, MD, [facility]  Electronically Signed    RMR/MedQ  DD: 10/23/2008  DT: 10/24/2008  Job #: [id number]   cc:   Currie Paris, M.D.  Ladona Horns. Mariel Sleet, MD

## 2011-04-08 NOTE — Assessment & Plan Note (Signed)
[facility]                       [place] CARDIOLOGY OFFICE NOTE   [name], [name]                   MRN:          [id number]  DATE:06/21/2009                            DOB:          12-22-1931    Alicia Harrington returns to the office for continued assessment and  treatment of atrial fibrillation.  Since her last visit, she feels  improved.  Dyspnea has decreased and exercise tolerance increased.  She  is walking 2 miles a day without difficulty.   Medications are as listed in the chart.  Metoprolol was decreased to 50  mg daily and digoxin at a dose of 0.25 mg daily.   PHYSICAL EXAMINATION:  GENERAL:  A very pleasant woman in no acute  distress.  NECK:  No jugular venous distention.  LUNGS:  Clear.  CARDIAC:  Irregular rhythm; modest systolic ejection murmur at the  cardiac base.  ABDOMEN:  Soft and nontender; no organomegaly.  EXTREMITIES:  Trace edema on the right.   A 6-minute walk test performed.  At rest, the heart rate was 80 with  premature ectopics versus aberrancy.  After 1200 feet of walking, the  heart rate was unchanged.   LABORATORY STUDIES:  Obtained today were virtually unremarkable.  A  complete metabolic profile was normal.  CBC showed a minimal residual  anemia with hemoglobin of 11.8 and hematocrit of 35.7.  MCV is increased  to 105.  Recent iron studies were normal.   IMPRESSION:  Alicia Harrington is doing very well.  There has been  substantial recovery in left ventricular systolic function as evidenced  by an estimated ejection fraction on an echocardiogram of March 2010  that was reviewed.  Her performance status is improved.  Her atrial  fibrillation is not clearly symptomatic.  Although her husband continues  to push for definitive treatment, I would proceed with rate control as  is currently being accomplished and plan to see this nice woman again in  3 months.     Gerrit Friends. Dietrich Pates, MD, [facility]  Electronically Signed    RMR/MedQ  DD: 06/21/2009  DT: 06/22/2009  Job #: [id number]

## 2011-04-08 NOTE — Group Therapy Note (Signed)
Alicia Harrington, Alicia Harrington            ACCOUNT NO.:  [id number]   MEDICAL RECORD NO.:  [id number]          PATIENT TYPE:  INP   LOCATION:  IC04                          FACILITY:  [facility]   PHYSICIAN:  Osvaldo Shipper, MD     DATE OF BIRTH:  1931/12/12   DATE OF PROCEDURE:  09/13/2008  DATE OF DISCHARGE:                                 PROGRESS NOTE   Subjectively the patient is feeling much better tonight.  She denies any  chest pain or shortness of breath.  No nausea or vomiting.  She is  wondering when she will be able to go home.   OBJECTIVE FINDINGS:  Her vital signs show that she is afebrile.  Her  heart rate is 115 and irregular, afebrile.  Blood pressure is 112/58.  Respiratory rate is about 16 breaths per minute.  Saturation 94%  currently on room air.  INS and OUTS:  She was negative by 3500 mL yesterday.  She made about  approximately 4 liters of urine yesterday.  Today so far she made about  1400 mL.   HEENT:  There is no pallor and no icterus.  Oral mucosa is moist.  No  oral lesions are noted.  NECK:  Soft and supple.  No thyromegaly is appreciated.  LUNGS:  Reveal a few crackles at the bases, but mostly clear to  auscultation bilaterally.  CARDIOVASCULAR:  Normal regular.  No S3 or S4 is heard.  No rubs and no  bruits.  I do not see any JVD today.  ABDOMEN:  Soft, nontender, nondistended.  Bowel sounds are present.  No  mass or organomegaly is appreciated.  EXTREMITIES:  Show minimal pedal edema bilaterally.  NEUROLOGICAL:  She is alert and oriented x3.  No focal neurological  deficits are present.  MUSCULOSKELETAL:  Exam was unremarkable.  GU:  Examination was deferred.   LABORATORY DATA:  Her white count is 3.1 with a normal absolute  neutrophil count, hemoglobin was 10.4 and stable.  Platelet count 205.  INR was 2.4.  Potassium 3.6 this evening, it was 2.8 earlier this  morning.  BNP was 294.  TSH is 0.082.   ECHOCARDIOGRAM:  Report is available, which shows an EF  of 40-45%.  His  EF September 2009 was 55%.   ASSESSMENT:  This is a 77 year old Caucasian female with a recent  diagnosis of breast cancer, for which she is getting chemotherapy.  She  presented with shortness of breath.  She has a history of atrial  fibrillation as well.  She was found to be in pulmonary edema secondary  to congestive heart failure.  She received IV Lasix and has improved.   PLAN:  1. Congestive heart failure:  Her ejection fraction was reduced      compared to an echocardiogram done about a month ago.  Cardiology      is following this patient.  The patient will probably require a      daily dose of diuretics.  I will defer this to the cardiologist to      decide.  I would also defer to them regarding  further testing that      may be required on this patient.  She currently is not on any      antiplatelet agents.  She is on Coumadin for atrial fibrillation.      She is on a beta blocker, which has been started during this      admission.  She continues to be on flecainide as well.  2. History of breast cancer:  On chemotherapy.  Dr. Ladona Horns. Neijstrom      is her oncologist.  This will be managed per his recommendations      when the patient is discharged from the hospital.  3. Hypertension.  She is on Norvasc.  4. Back pain:  Due to a ruptured disk, which is also stable at this      time.  5. Hypokalemia:  Has been repleted and has resolved.  6. Low TSH.  We will check her TSH and free T4 tomorrow morning, and      proceed from there.   So, at this time I think the patient is stable enough to be transferred  to the regular floor.  We will wait until tomorrow morning to see what  Dr. Dietrich Pates has to say.  Depending on that, further decisions regarding  disposition will have to be made.      Osvaldo Shipper, MD  Electronically Signed     GK/MEDQ  D:  09/13/2008  T:  09/13/2008  Job:  [id number]

## 2011-04-08 NOTE — Op Note (Signed)
Alicia Harrington, Alicia Harrington            ACCOUNT NO.:  [id number]   MEDICAL RECORD NO.:  [id number]          PATIENT TYPE:  AMB   LOCATION:  SDS                          FACILITY:  [facility]   PHYSICIAN:  Currie Paris, M.D.DATE OF BIRTH:  03-23-1932   DATE OF PROCEDURE:  05/22/2008  DATE OF DISCHARGE:  05/22/2008                               OPERATIVE REPORT   PREOPERATIVE DIAGNOSIS:  Carcinoma of the right breast, clinical stage  II.   POSTOPERATIVE DIAGNOSIS:  Carcinoma of the right breast, clinical stage  II.   OPERATION:  Port-A-Cath placement.   SURGEON:  Currie Paris, MD   ANESTHESIA:  MAC.   CLINICAL HISTORY:  This is a 77 year old patient getting ready to start  neoadjuvant chemotherapy for her right breast cancer.  IV access was  required for chemo.   DESCRIPTION OF THE PROCEDURE:  The patient was seen in the holding area  and she had no further questions.  We confirmed Port-A-Cath placement as  the operative procedure.   The patient was taken to the operating room after satisfactory IV  sedation had been obtained.  The upper chest and lower neck were prepped  and draped as a sterile field.  The time-out was done.   A combination of 1% plain Xylocaine and 0.5% plain Marcaine was mixed  equally and used for local.  The left infraclavicular infraclavicular  fossa was infiltrated 1% Xylocaine in the left subclavian vein entered  and the guidewire introduced and using fluoroscopy threaded into  superior vena cava.   Additional local was infiltrated and a transverse incision was made in  the anterior chest wall in a pocket fashion.  Port-A-Cath tubing was  brought between the two sites.  The guidewire tract was dilated once  using fluoroscopy guide the dilator and then the guidewire and dilator  were removed.  The catheter was threaded to approximate 18 cm again  using fluoro to see come out the tip of the dilator and the dilator was  correct.  The peel-away  sheath was removed.  The cath catheter were  aspirated and flushed easily.   I backed it up into about 18 cm were appeared to be in the distal  superior vena cava.  The reservoir was flushed and attached locking  mechanism engaged.  I then secured the reservoir was 2-0 Prolene.  An  using fluoro it appeared that the catheter tip had backed up while I was  manipulating the catheter to attache it to the reservoir.  I therefore  made infraclavicular incision just a little bit bigger took out my  sutures that were holding the port and advanced the catheter until it  was in the distal superior vena cava.  I resecured the port with some  Prolenes, a little bit higher up and then rechecked with fluoroscopy and  everything appeared to be in good position.  Again everything aspirated  and flushed easily.   Incision was closed with 3-0 Vicryl for Monocryl subcuticular Dermabond.  The catheter was accessed with a right ankle Demetrios Isaacs point needle and  aspirated and flushed easily and then flushed  with low concentrated  aqueous heparin.  A final fluoro showed we had good positioning of the  tip and no kinks.   The patient was tolerated procedure well and there no complications.      Currie Paris, M.D.  Electronically Signed     CJS/MEDQ  D:  05/22/2008  T:  05/23/2008  Job:  [id number]   cc:   Gerrit Friends. Dietrich Pates, MD, [facility]  Ladona Horns. Mariel Sleet, MD

## 2011-04-08 NOTE — Letter (Signed)
January 02, 2009    Ladona Horns. Neijstrom, MD  [address]  [place], [place] [address]   RE:  MAJESTA, LEICHTER  MRN:  [id number]  /  DOB:  08/27/1932   Dear Minerva Areola,   Ms. Soulier returns to the office for continued assessment, treatment  of cardiomyopathy, and atrial fibrillation.  Since I last saw her, she  underwent lumpectomy and lymph node dissection on the right on December 14, 2008.  She had satisfactory postoperative course except for  dehiscence of the wound on her back where a cyst was removed at the same  time as her breast procedure.  Plans are for that to heal by secondary  intent.  She continues to have symptoms of peripheral neuropathy that  bother her.  Her exertional dyspnea and fatigue have improved.   Medications are unchanged from her last visit except that she has  discontinued fish oil.   PHYSICAL EXAMINATION:  GENERAL:  Pleasant, thin woman in no acute  distress.  VITAL SIGNS:  The weight is 123, 12 pounds less than in January.  Blood  pressure 110/80, heart rate 98 and irregular, respirations 14.  NECK:  No jugular venous distention.  LUNGS:  Clear.  CARDIAC:  Irregular rhythm; normal first and second heart sounds;  minimal systolic murmur.  ABDOMEN:  Soft and nontender; no organomegaly.  EXTREMITIES:  Trace edema.   Most recent chemistry profile was early in January and was normal except  for the creatinine of 1.6.   Rhythm strip:  Atrial fibrillation with an average heart rate of 88.   IMPRESSION:  Ms. Nest is improving following a difficult course of  chemotherapy and surgery.   Plans are to begin radiation therapy in the near future.  At this point,  it is unclear how much, if any, atrial fibrillation was contributing to  her symptoms.  Heart rate control is still somewhat suboptimal.  We will  plan to convert her to sinus rhythm without use of an antiarrhythmic  agent.  Hopefully, she will maintain that rhythm for at least a week or  2, so  that we can determine whether she feels substantially better when  in sinus rhythm.  If so, we will arrange hospitalization for treatment  with dofetilide.  Cardioversion is planned for January 16, 2009.  I  will reassess Ms. Dykstra a week or 2 thereafter.    Sincerely,      Gerrit Friends. Dietrich Pates, MD, [facility]  Electronically Signed    RMR/MedQ  DD: 01/02/2009  DT: 01/03/2009  Job #: [id number]   CC:    Billie Lade, M.D.

## 2011-04-08 NOTE — Consult Note (Signed)
Alicia Harrington, LABELL            ACCOUNT NO.:  [id number]   MEDICAL RECORD NO.:  [id number]          PATIENT TYPE:  INP   LOCATION:  IC04                          FACILITY:  [facility]   PHYSICIAN:  Gerrit Friends. Dietrich Pates, MD, FACCDATE OF BIRTH:  [birth year]   DATE OF CONSULTATION:  DATE OF DISCHARGE:                                 CONSULTATION   REFERRING PHYSICIAN:  Dr. Elige Radon.   PRIMARY CARE PHYSICIAN:  None.   PRIMARY CARDIOLOGIST:  Gerrit Friends. Dietrich Pates, MD, [facility]   PRIMARY ONCOLOGIST:  Ladona Horns. 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  Alicia Harrington has been a patient of  mine for years.  She first presented with exertional dyspnea and chest  discomfort.  Cardiac catheterization revealed normal left ventricular  systolic function and no coronary artery disease.  She did well with  minimal medical therapy until a few years thereafter when she developed  intermittent atrial fibrillation.  Her arrhythmia has been persistent in  recent years, requiring antiarrhythmic therapy and cardioversion.  She  most recently has been treated with flecainide with good maintenance of  sinus rhythm.  She was last in the office 7 months ago at which time she  was maintaining sinus bradycardia.  She is chronically anticoagulated  and tolerates Coumadin therapy well.  She was diagnosed with carcinoma  of the breast few months ago, and has been receiving preoperative  chemotherapy.  She recently developed symptoms related to diskogenic  disease of the lumbosacral spine and risk and required surgery.  She has  recovered well from that procedure.   Over the past week or two, she has noted increasing pedal edema and  weight gain.  There has been no orthopnea nor PND.  Exercise tolerance  may be somewhat decreased.  There has been noted some intermittent  wheezing.  She was receiving Taxol infusion today in the hospital when  she suddenly developed markedly increased dyspnea.  She was noted to  have a rapid irregular pulse.  Chest x-ray showed pulmonary edema  prompting admission to the intensive care unit.  With initial diuresis,  she is substantially improved, but still hypoxemic on room air.   Of interest, Alicia Harrington developed acute dyspnea while on a vacation in  the [place] 2 years ago.  A physician was in her traveling  party and they gave her hydrochlorothiazide with improvement.  She had  marked pedal edema, but this ultimately resolved.  She did not receive  any form of medical attention on medication.  She had another episode  approximately a year ago when she developed dyspnea at night.  Without  any treatment, her symptoms resolved.  She has had intermittent pedal  edema and uses diuretics as needed.   Past medical history is notable for hypertension that has been well  controlled with medical therapy.  Surgeries have included the procedure  on her left ear for otosclerosis  decades ago, a remote hysteroscopy, and  her recent uncomplicated L3-L4 diskectomy.   SOCIAL HISTORY:  No use of tobacco products; social alcohol.  Married to  a retired Development worker, community and lives locally.   FAMILY HISTORY:  Mother had a history of rheumatic heart disease and  died at age 60.  One brother suffered a fatal CVA.  No prominent family history of coronary disease or arrhythmias.   REVIEW OF SYSTEMS:  Continues to have some intermittent back discomfort,  but much improved since her surgery.  Remains fairly active.  Her  husband recently required surgery for a lumbosacral spine disease and is  scheduled to be released soon.  He will be requiring substantial  assistance at home.  She has had some decreased appetite with  chemotherapy.  All other systems reviewed and  are negative.   Medications include:  1. Warfarin as directed with stable and therapeutic anticoagulation.  2. Amlodipine 5 mg daily.  3. Fish oil 1 capsule b.i.d.  4. Toprol 25 mg daily.  5. Flecainide 100 mg b.i.d.  6. Celebrex 200 mg daily.   PHYSICAL EXAMINATION:  GENERAL:  Pleasant woman wearing an oxygen mask  and in no acute distress.  VITAL SIGNS:  Heart rate is 108 and irregular, respirations 22, blood  pressure 120/60, O2 sat 98% with face mask oxygen.  HEENT:  EOMs full; pupils are equal, round, and reactive to light;  normal oral mucosa.  NECK:  Mild jugular venous distention; no carotid bruits.  LUNGS:  Dullness to percussion, more prominent on the right at the base;  few basilar rales; decreased air movement.  CARDIAC:  Rapid irregular rhythm; third heart sound present.  ABDOMEN:  Soft and nontender; no organomegaly; no masses; no bruits.  EXTREMITIES:  1+ pretibial and presacral edema; distal pulses intact.  NEUROMUSCULAR:  Symmetric strength and tone; normal cranial nerves.   Laboratory notable for normal cardiac markers, mild hypocapnia, and a  normal pH on arterial blood gas, normal creatinine 0.66, mild anemia  with hemoglobin of 10.1, and a therapeutic INR of 2.4.  Chest x-ray  shows pulmonary edema.  EKG shows atrial fibrillation with a rapid  ventricular response.  No 12-lead tracing is available for review.   IMPRESSION:  Alicia Harrington has developed frank pulmonary edema for the  first time since I have been caring for her.  There are a number of  possibilities including cardiomyopathy related to chemotherapy,  congestive heart failure with normal systolic function and presumed  diastolic dysfunction, initial onset of atrial fibrillation resulting in  congestive heart failure or myocardial ischemia or infarction.  With  normal coronary angiography 8 years ago and few risk factors, I think  the development of ischemic heart disease is very unlikely.  She  has  developed atrial fibrillation in the past without the onset of  congestive heart failure, so primary atrial fibrillation in itself is  probably not the only issue.  She recently had an echocardiogram  following treatment with Adriamycin, which did not show any impairment  in systolic function.  Most likely, she has chronic diastolic  dysfunction and has had congestive heart failure on a few occasions in  the past that has cleared spontaneously.  That problem may be  exacerbated by all the other factors noted above.  She is responding  well to initial diuresis.  She will require continuing treatment with  oral furosemide, which will be the mainstay of her therapy.   Atrial fibrillation has been persistent  in the past, not converted  spontaneously.  Flecainide can be held for now.  Toprol will be given to  control heart rate.  Anticoagulation will be continued.  Despite the  negligible possibility of coronary disease, serial cardiac markers will  be obtained.  We appreciate the opportunity to assist with Ms.  Harrington's care and will be happy to follow her with you.      Gerrit Friends. Dietrich Pates, MD, [facility]  Electronically Signed     RMR/MEDQ  D:  09/12/2008  T:  09/13/2008  Job:  [id number]

## 2011-04-08 NOTE — Letter (Signed)
December 04, 2008    Ladona Horns. Neijstrom, MD  [address]  [place], [place] [address]   RE:  Alicia Harrington, Alicia Harrington  MRN:  [id number]  /  DOB:  1932-08-27   Dear Minerva Areola,   Alicia Harrington returns to the office for continued assessment and  treatment of nonischemic cardiomyopathy, atrial fibrillation, and  congestive heart failure.  Since her last visit, she is markedly  improved.  Nausea resolved after amiodarone was discontinued.  Her INR,  which was markedly supratherapeutic has returned to a therapeutic range.  She has decreased, but still some dyspnea on exertion.  Edema has  resolved with a 10-pound weight loss due to increased diuresis.   Current medications include:  1. Warfarin as directed.  2. Furosemide 40 mg b.i.d.  3. KCl 20 mEq b.i.d.  4. Valsartan 80 mg daily.  5. Metoprolol 50 mg daily.   PHYSICAL EXAMINATION:  GENERAL:  Well-appearing woman.  VITAL SIGNS:  The weight is 123, 12 pounds less than 7 days ago, blood  pressure 110/75, heart rate 82 and irregular, respirations 14.  NECK:  No jugular venous distention.  No carotid bruits.  LUNGS:  Minimal right basilar rales.  CARDIAC:  Irregular rhythm.  Normal first and second heart sounds.  ABDOMEN:  Soft and nontender.  No organomegaly.  EXTREMITIES:  Trace edema.   IMPRESSION:  Alicia Harrington is markedly improved with further adjustment  of her medical therapy.  We will reduce Lasix to 60 mg daily.  A  chemistry profile will be obtained today.  She  will stop Coumadin 4 days before her surgery and come to the office the  day before to verify that INR is normal or near normal.  I will see this  nice woman again towards the end of the month.  Her risk for the fairly  minor surgery that is proposed on an outpatient basis is low.    Sincerely,      Gerrit Friends. Dietrich Pates, MD, [facility]  Electronically Signed    RMR/MedQ  DD: 12/04/2008  DT: 12/05/2008  Job #: [id number]   CC:    Currie Paris, M.D.

## 2011-04-08 NOTE — Assessment & Plan Note (Signed)
[facility]                       [place] CARDIOLOGY OFFICE NOTE   DAWANNA, GRAUBERGER                   MRN:          [id number]  DATE:06/04/2009                            DOB:          07-Dec-1931    HISTORY OF PRESENT ILLNESS:  Ms. Lynam returns to the office after 1  month of vacationing in [place], [place], and [place].  She did  quite well with all of this traveling, but sometimes develops orthopnea  and wheezing associated with pedal edema that resolves after an  increased dose of furosemide.  She typically has no orthopnea nor PND.  She has developed vertigo and is being evaluated by an otolaryngologist.  She may have some orthostatic hypotension as well and does have  typically low blood pressure.  She has not noted palpitations.  Her  exercise tolerance is stable, but somewhat depressed from her baseline.  Her husband believes that she felt substantially better in sinus rhythm  than she does in atrial fibrillation and was able to do more walking.   Anticoagulation has been stable to therapeutic.  She did not require  dosage adjustment while she was traveling.  INR is 2.9 today.   CURRENT MEDICATIONS:  1. Warfarin.  2. Furosemide 40-60 mg per day based upon weight and symptoms.  3. Furosemide 20 mEq daily.  4. Valsartan 80 mg daily.  5. Metoprolol 75 mg daily.  6. Prilosec 20 mg daily.  7. Tamoxifen 20 mg daily.  8. Vitamin D2 tablets daily.   PHYSICAL EXAMINATION:  GENERAL:  Trim, pleasant woman in no acute  distress.  VITAL SIGNS:  Weight is 120, unchanged.  Blood pressure 130/80 without  orthostatic change, heart rate 100 taken apically, but 75 on a rhythm  strip obtained earlier today.  NECK:  No jugular venous distention; normal carotid upstrokes without  bruits.  LUNGS:  Clear.  CARDIAC:  Normal first and second heart sounds; irregular rhythm that is  rapid at times; modest systolic ejection murmur; no third  heart sound  appreciated.  ABDOMEN:  Soft and nontender; no organomegaly.  EXTREMITIES:  No edema; distal pulses intact.   Rhythm Strip:  Atrial fibrillation with controlled ventricular response;  normal intervals.  Most recent laboratory is from June, at which time  iron studies were normal as was TSH.  She remained anemic with a  hemoglobin of 10.6 and an increased MCV.  Chemistry profile was normal.   IMPRESSION:  Ms. Fenner is doing fairly well overall.  It does not  sound as if her cardiomyopathy is resolving, but it appears to be  relatively well compensated.  Due to marginal blood pressures most of  the time and suboptimal control of atrial fibrillation, digoxin will be  added at a dose of 0.25 mg daily.  Her dose of metoprolol will be  reduced back to 50 mg daily.  She and her husband will continue to  monitor her weight and symptoms and adjust warfarin dosage.  I will plan  to see this nice woman again in 2 weeks.   We discussed anticoagulation and treatment of atrial fibrillation.  Her  Coumadin  can be stopped for removal of her subcutaneous infusion port by  Dr. Jamey Ripa.  Other options for maintenance of sinus rhythm at the  present time are amiodarone, dofetilide, or radiofrequency ablation.  Dr. Kate Sable is eager to pursue those options, but Ms. Brackney is  undecided.  We will continue to discuss them and consider referral to  Dr. Johney Frame.     Gerrit Friends. Dietrich Pates, MD, [facility]  Electronically Signed    RMR/MedQ  DD: 06/04/2009  DT: 06/05/2009  Job #: [id number]   cc:   Currie Paris, M.D.

## 2011-04-08 NOTE — Discharge Summary (Signed)
NAMEIVELISE, CASTILLO            ACCOUNT NO.:  [id number]   MEDICAL RECORD NO.:  [id number]          PATIENT TYPE:  INP   LOCATION:  A339                          FACILITY:  [facility]   PHYSICIAN:  Osvaldo Shipper, MD     DATE OF BIRTH:  12-31-1931   DATE OF ADMISSION:  09/12/2008  DATE OF DISCHARGE:  10/23/2009LH                               DISCHARGE SUMMARY   The patient is followed by Dr. Mariel Sleet for breast cancer.  She does  not really have a primary care doctor.  She was seen also by Dr.  Dietrich Pates during this admission.   Please review H and P dictated by Dr. Dorris Singh for details  regarding the patient's presenting illness.   DISCHARGE DIAGNOSES:  1. Congestive heart failure with ejection fraction of 40% with      pulmonary edema resolved.  2. History of breast cancer on chemotherapy.  3. Hypertension on Norvasc.   BRIEF HOSPITAL COURSE:  Briefly this is a 77 year old Caucasian female  who presented with shortness of breath.  She is on chemotherapy for  breast cancer.  She was found to be in pulmonary edema.  She was given  IV Lasix and she has improved.  Echocardiogram was done which revealed  an EF of 40% reduced from 55% in September of 2009.  It is felt that her  CHF could be secondary to chemotherapy.  Dr. Dietrich Pates has been following  this patient and he yesterday felt that the patient could be discharged  today.  Beta blockers are on board.  She is also on flecainide.  She was  also started on an ACE inhibitor yesterday.  She will be also on p.o.  diuretics.  The patient also has a history of atrial fibrillation for  which she is on Coumadin.  She was maintained on flecainide which is  being continued.  The patient's INR was elevated yesterday at 3.6.  Coumadin was held.  INR is 3.3 this morning. Coumadin will be held today  as well and the patient can resume her usual dose from tomorrow.  She is  not on any medications which can interact with warfarin at this  time.   She also had a low TSH.  Repeat thyroid function tests revealed normal  free T4 and a TSH of 0.337.  Outpatient follow-up recommended for this.   The rest of her medical issues are stable.  She will follow up with Dr.  Mariel Sleet regarding her breast cancer.   PHYSICAL EXAMINATION:  GENERAL:  On the day of discharge the patient is  feeling much better.  She feels that she is the back to her baseline.  Denies any chest pain or shortness of breath.  VITAL SIGNS:  Vital signs are all stable.  She is saturating 95% on room  air.  LUNGS:  Clear to auscultation bilaterally.  CARDIOVASCULAR:  S1 and S2 irregular.  No murmurs appreciated.  No S3 or  S4, no rubs, no bruits, no JVD is noted.  ABDOMEN:  Soft, nontender, nondistended.  Bowel sounds are present.  No  masses or organomegaly is appreciated.  EXTREMITIES:  Extremities do not show any edema.  The rest of the exam was unremarkable.   DISCHARGE MEDICATIONS:  1. Lasix 40 mg p.o. daily.  2. Flecainide 100 mg p.o. b.i.d.  3. Toprol XL 50 mg p.o. once daily.  4. Enalapril 2.5 mg p.o. b.i.d.  5. Norvasc 5 mg p.o. daily.  6. Celebrex daily as needed.  7. Glucosamine daily.  8. Calcium with vitamin D once daily.  9. Multivitamins one daily.  10.She is also on Pepcid, Zofran, Compazine, and Decadron which she      takes with chemotherapy.  11.She is also on Valium and Percocet as needed.  12.Coumadin 7.5 and 8.5 alternating daily.   FOLLOW UP:  Follow up with Dr. Dietrich Pates and with Dr. Mariel Sleet.  BMET  and PT/INR to be checked in 3 days time.   Imaging studies done during this admission include chest x-rays which  showed initially pulmonary edema and subsequent film showed resolving  pulmonary edema.   DIET:  She can have a heart-healthy diet.   ACTIVITY:  Physical activity as before.   Cardiology to determine if she needs further testing in the form of  cardiac cath for her reduced EF.   Total time on this discharge  encounter 35 minutes.      Osvaldo Shipper, MD  Electronically Signed     GK/MEDQ  D:  09/15/2008  T:  09/15/2008  Job:  [id number]   cc:   Gerrit Friends. Dietrich Pates, MD, [facility]  [address]  [place], [place] [address]   Ladona Horns. Mariel Sleet, MD  Fax: [phone]

## 2011-04-08 NOTE — Assessment & Plan Note (Signed)
[facility]                       [place] CARDIOLOGY OFFICE NOTE   [name], [name]                   MRN:          [id number]  DATE:09/22/2008                            DOB:          18-Dec-1931    [name] returns to the office following a recent hospital admission  for congestive heart failure.  She was found to be in recurrent atrial  fibrillation with rapid ventricular response.  Echocardiography showed  new left ventricular dysfunction.  She has felt better since returning  home, but certainly not up to par.  She is dyspneic with mild-to-  moderate exertion and is limiting her activity.  She has increased  urinary frequency with furosemide and has been varying the dose.  She  received Taxol as her most recent chemotherapy.  She has developed a  rash over her chest and arms.   CURRENT MEDICATIONS:  1. Flecainide 100 mg b.i.d.  2. Toprol-XL 50 mg daily.  3. Furosemide 40 mg daily.  4. Ramipril 2.5 mg daily.  5. Fish oil 1 b.i.d.  6. Warfarin as directed with subtherapeutic INRs.   She developed low blood pressure and discontinued amlodipine.   PHYSICAL EXAMINATION:  GENERAL:  On exam, Alicia Harrington looks about back to  baseline in terms of her appearance.  VITAL SIGNS:  Her weight is 137, 2 pounds less than in March.  Blood  pressure 115/70, heart rate 80 and irregular taken apically.  NECK:  No jugular venous distention.  LUNGS:  Clear.  CARDIAC:  Irregular rhythm; normal first and second heart sounds; modest  systolic ejection murmur.  ABDOMEN:  Soft and nontender; no organomegaly.  EXTREMITIES:  1/2+ ankle edema.  SKIN:  Maculopapular rash over the forearms and chest, confluent over  the hands.   IMPRESSION:  Alicia Harrington has certainly improved after hospitalization.  Due to flecainide's adverse effect on left ventricular function, that  medication will be discontinued.  Her rash could certainly be due to  ramipril, which  will also be discontinued.  Valsartan 80 mg daily will  be  substituted.  She will reduce furosemide to 20 mg daily and monitor  weight at home.  A chemistry profile will be obtained in 1 week with a  return office visit in 2 weeks.     Gerrit Friends. Dietrich Pates, MD, [facility]  Electronically Signed    RMR/MedQ  DD: 09/22/2008  DT: 09/23/2008  Job #: [id number]   cc:   Ladona Horns. Mariel Sleet, MD

## 2011-04-08 NOTE — Op Note (Signed)
Alicia Harrington, Alicia Harrington            ACCOUNT NO.:  [id number]   MEDICAL RECORD NO.:  [id number]          PATIENT TYPE:  OIB   LOCATION:  4704                         FACILITY:  [facility]   PHYSICIAN:  Currie Paris, M.D.DATE OF BIRTH:  01/13/1932   DATE OF PROCEDURE:  DATE OF DISCHARGE:                               OPERATIVE REPORT   PREOPERATIVE DIAGNOSES:  1. Carcinoma right breast upper outer quadrant, stage II status post      neoadjuvant chemotherapy.  2. Recurrent epidermoid cyst of the back.   POSTOPERATIVE DIAGNOSES:  1. Carcinoma right breast upper outer quadrant, stage II status post      neoadjuvant chemotherapy.  2. Recurrent epidermoid cyst of the back.   OPERATION:  Right needle guided lumpectomy, right axillary dissection,  excision of epidermoid cyst of right upper back.   SURGEON:  Currie Paris, MD   ANESTHESIA:  General.   CLINICAL HISTORY:  This is a 77 year old lady who has undergone  neoadjuvant chemotherapy for a stage II right breast cancer, upper outer  quadrant.  She has had a complete clinical response.  She was now  scheduled for a lumpectomy and node dissection.  She has also had a  gradually enlarging recurrent epidermoid cyst of the back, which she  wished to have her repaired since it was starting to become symptomatic  and uncomfortable where she was sleeping.   DESCRIPTION OF PROCEDURE:  The patient was seen in the holding area and  reviewed the situation with her and her husband.  I marked the right  breast as the operative side.  I reviewed the mammogram.  Needle  localizing films in the guidewire appeared to be in good position.  There was no evidence of recurrent residual cancer.  Identified the  epidermoid cyst on the back and marked that.   The patient was taken to the operating room and after satisfactory  general endotracheal anesthesia had been obtained, the right breast and  axillary area were prepped and draped.  The time  out was done.   I made a curvilinear incision directly over an X  mark on the skin  that the radiologist had placed on doing the localization films.  I  divided just a little bit of subcutaneous tissue and raised thin skin  flap in all directions and manipulated the guidewire into the wound.  Using cautery, I took a large excisional lumpectomy around the guidewire  going down to the chest wall laterally so that we had a very thin  lateral flap and medially to underneath the nipple-areolar complex.  Now, the edges looked abnormal other than breast tissue with some  fibrosis.  I sent this for specimen mammogram.  I subsequently came back  just to be certainly had adequate margin and took some more of the  medial margin and labeled that separately.   I did discuss the results of the specific mammogram later on with Dr.  Isaiah Serge and on TV mammogram to clip and the area that had been a  spiculated mass appeared to be in the center of the specimen.   While waiting  for the report on the mammogram, I went ahead and made a  transverse axillary incision and divided the subcutaneous tissue and  entered the axilla and went down to the chest wall and identified  anteriorly the edge of the pectoralis and posteriorly the edge of  latissimus.  I then opened the clavipectoral fascia and tried to peel  and pull out all the axillary contents.  There was more fibrosis than  usual which I suspected perhaps was from chemotherapy effects and she  was known preoperatively to have at least one positive node.  Nevertheless, I was able to sweep these contents out, get up into the  apex of the axilla and stayed below the axillary vein, bring the  axillary contents from medial to lateral and superior to inferior.  I  identified and preserved both the long thoracic and thoracodorsal  nerves, but the second intercostal was divided.  I tried not to get too  high into the axilla to avoid lymphedema risk, and also  stayed below the  axillary vein for the same reason.  There was no obviously malignant  lymph nodes in the tissue.   I irrigated and made sure everything was dry.  I checked both the long  thoracic and thoracodorsal nerves to be sure they still functioned.  I  put a 19 Blake drain and secured it with a 3-0 nylon.  Final irrigation,  check for hemostasis was made, an incision closed in layers with 3-5  Vicryl and 4-0 Monocryl subcuticular.  I turned my attention back to the  breast incision and at this point, took the extra margin as described  above.  I then copiously irrigated to make sure everything was dry and  close the superficial subcutaneous tissue with some 3-0 Vicryl and the  skin with a 4-0 Monocryl subcuticular and Dermabond on both of these  incisions.   The patient was then turned on her left side so I could approach the  cyst on the back.  Using all new gloves, instruments, etc. the area  around the cyst was prepped and draped.  I made an elliptical incision  to take the old scar and the open punctum and using cautery, excised the  cyst intact.  I made sure everything was dry and because there was a  little tension, I went ahead and closed this with interrupted vertical  mattress 3-0 nylon sutures.   The patient tolerated the procedures well.  There were no operative  complications.  All counts were correct.      Currie Paris, M.D.  Electronically Signed     CJS/MEDQ  D:  12/14/2008  T:  12/15/2008  Job:  [id number]   cc:   Gerrit Friends. Dietrich Pates, MD, [facility]  Ladona Horns. Mariel Sleet, MD

## 2011-04-08 NOTE — Letter (Signed)
November 27, 2008    Alicia Harrington. Neijstrom, MD  [address]  [place], [place] [address]   RE:  Alicia Harrington, Alicia Harrington  MRN:  [id number]  /  DOB:  08-07-1932   Dear Minerva Areola,   Alicia Harrington returns to the office for continued assessment and  treatment of a nonischemic cardiomyopathy, paroxysmal, most recently  persistent, atrial fibrillation and episodic congestive heart failure.  Since her last visit, she was admitted to hospital for neutropenia.  During that admission, there was concerned about marginally low blood  pressures prompting discontinuation of valsartan and metoprolol.  Her  other medications include warfarin as directed with a recently stable  and therapeutic anticoagulation, fish oil b.i.d., furosemide 40 mg  daily, amiodarone 200 mg daily, and KCl 20 mEq daily.   Of late, she has noted some mild nausea.  This is being treated  symptomatically.  Her husband is concerned that it could be caused by  amiodarone, which is certainly a possibility; however, she tolerated  much higher doses in the past without GI symptoms, and there are other  culprits to account for her GI distress.  She also has had substantial  pedal edema.  She has class II-III dyspnea on exertion, but gets around  without too much trouble.  She has not noted palpitations.  She has had  no lightheadedness or syncope.  She has had no chest discomfort.   PHYSICAL EXAMINATION:  GENERAL:  Pleasant, well-appearing woman  considering all that she has been through.  VITAL SIGNS:  The weight is 135, 4 pounds more than her last visit.  Blood pressure 120/80, heart rate 115 and irregular, and respirations  14.  NECK:  No jugular venous distention; no carotid bruits.  LUNGS:  Clear.  CARDIAC:  Irregular rhythm; normal first and second heart sounds; no  third heart sound appreciated.  EXTREMITIES:  2+ pitting pretibial edema.   Rhythm strip:  Atrial fibrillation with a rapid ventricular response.   IMPRESSION:  Ms.  Harrington is doing fairly well.  Congestive heart  failure appears compensated.  Her heart rate is once again out of  control due to discontinuation of beta-blocker.  Metoprolol will be  restarted at dose 50 mg daily.  Her valsartan will be resumed.  We will  verify 3 weeks of adequate anticoagulation and plan to discontinue  cardioversion.  If effective, she will need to remain on warfarin for 1  month continuously after which surgical therapy for her breast carcinoma  can proceed.  I will continue to follow her closely and greatly  appreciate your care and interest.    Sincerely,      Gerrit Friends. Dietrich Pates, MD, [facility]  Electronically Signed    RMR/MedQ  DD: 11/27/2008  DT: 11/28/2008  Job #: [id number]   CC:    Dr. Jamey Ripa

## 2011-04-08 NOTE — Assessment & Plan Note (Signed)
[facility]                       [place] CARDIOLOGY OFFICE NOTE   BRYTON, WAIGHT                   MRN:          [id number]  DATE:06/07/2007                            DOB:          [birth year]    Ms. Dorning is seen in the office at her request but it is virtually  time for her semi-annual visit.  She has done well from a cardiac  standpoint with no symptoms and no apparent recurrences of atrial  fibrillation.  She reports a 3 week illness characterized by rhinorrhea,  laryngitis, and cough productive of yellow sputum.  There has been some  blood tinged sputum as well and blood in her nasal secretions.  She has  had some myalgias.  There has been no definite fever.  She recently  started a prescription for cefprozil 500 mg daily and has been taking an  anti-tussive as well.   EXAMINATION:  Pleasant, well-appearing woman.  The weight is 142 -  stable.  Blood pressure 140/90, heart rate 72 and regular, respirations  16.  Temperature 97.4.  No jugular venous distention; no carotid bruits.  LUNGS:  Few rhonchi at the bases; no rales.  CARDIAC:  Minimal systolic murmur; no gallop appreciated; normal first  and second heart sounds.  ABDOMEN:  Soft and nontender; no organomegaly.  EXTREMITIES:  Trace edema; distal pulses intact.   Rhythm strip:  Sinus rhythm with first degree AV block.   IMPRESSION:  Ms. Shawhan is doing well overall.  She has a upper  respiratory infection, probably of viral etiology.  Due to the prolonged  nature of her symptoms and the findings on exam, a chest x-ray will be  obtained.  She is also due for annual lab to include a CBC, chemistry  profile, and stool for Hemoccult testing.  If the latter is positive,  repeat samples will need to be obtained.   I will plan to see this nice woman again in 6 months.  She will call if  her current symptoms worsen.     Gerrit Friends. Dietrich Pates, MD, [facility]  Electronically  Signed    RMR/MedQ  DD: 06/07/2007  DT: 06/07/2007  Job #: [id number]

## 2011-04-08 NOTE — Discharge Summary (Signed)
Alicia Harrington, ALARID            ACCOUNT NO.:  [id number]   MEDICAL RECORD NO.:  [id number]          PATIENT TYPE:  INP   LOCATION:  A339                          FACILITY:  [facility]   PHYSICIAN:  Osvaldo Shipper, MD     DATE OF BIRTH:  August 16, 1932   DATE OF ADMISSION:  09/12/2008  DATE OF DISCHARGE:  10/23/2009LH                               DISCHARGE SUMMARY   ADDENDUM:  ADD THE FOLLOWING TO THE DISCHARGE SUMMARY FROM 09/15/2008   DISCHARGE MEDICATIONS:  1. Ativan that she receives with chemotherapy.  We do not know the      dose unfortunately.  2. Prevacid 1 capsule unknown dose daily.      Osvaldo Shipper, MD  Electronically Signed     GK/MEDQ  D:  09/27/2008  T:  09/28/2008  Job:  [id number]

## 2011-04-08 NOTE — Op Note (Signed)
Alicia Harrington, Alicia Harrington            ACCOUNT NO.:  [id number]   MEDICAL RECORD NO.:  [id number]          PATIENT TYPE:  AMB   LOCATION:  DSC                          FACILITY:  [facility]   PHYSICIAN:  Currie Paris, M.D.DATE OF BIRTH:  [birth year]   DATE OF PROCEDURE:  07/19/2009  DATE OF DISCHARGE:                               OPERATIVE REPORT   PREOPERATIVE DIAGNOSIS:  Unneeded Port-A-Cath.   POSTOPERATIVE DIAGNOSIS:  Unneeded Port-A-Cath.   PROCEDURE:  Removal of Port-A-Cath.   SURGEON:  Currie Paris, MD   ANESTHESIA:  Local.   CLINICAL HISTORY:  This is a 77 year old lady who has finished all of  her chemotherapy for her breast cancer.  She wished to have her port  removed.   DESCRIPTION OF PROCEDURE:  The patient was seen in the minor procedure  room and we confirmed the plans for the procedure.  She had no further  questions.   The area was anesthetized with 1% Xylocaine with epi.  I used 10 mL.   With 10 minutes and then we prepped the area with some Betadine.  Sterilely draped.  The old scar was opened and the Port-A-Cath tubing  identified and backed out.  I put a suture through the tract to avoid  back bleeding.   Using a scalpel, I was able to open the capsule around the port and  remove it, pulling all 3 Prolene sutures.   Nothing appeared to be bleeding.  The incision was then closed in layers  with 3-0 Vicryl, 4-0 Monocryl subcuticular, and Dermabond.   The patient tolerated the procedure well and there were no  complications.      Currie Paris, M.D.  Electronically Signed     CJS/MEDQ  D:  07/19/2009  T:  07/19/2009  Job:  [id number]   cc:   Ladona Horns. Mariel Sleet, MD

## 2011-04-08 NOTE — Assessment & Plan Note (Signed)
[facility]                       [place] CARDIOLOGY OFFICE NOTE   MYLINH, CRAGG                   MRN:          [id number]  DATE:01/31/2009                            DOB:          Sep 11, 1932    Alicia Harrington returns to the office for continued assessment and  treatment of paroxysmal atrial fibrillation and cardiomyopathy, presumed  chemotherapy - induced.  Since her cardioversion a few weeks ago, she  has improved substantially.  She is no longer dyspneic with routine  activities.  She has done some housework.  She is riding her stationary  bike.  She has noted no palpitations, but never has in the past.  There  has been no lightheadedness or syncope.  Edema has resolved.   CURRENT MEDICATIONS:  1. Warfarin with stable and therapeutic anticoagulation.  2. Furosemide 40-60 mg daily.  3. KCl 20 mEq daily.  4. Valsartan 80 mg daily.  5. Metoprolol 50 mg daily.  6. Prilosec 20 mg daily.  7. She takes calcium and a number of vitamins as well.   PHYSICAL EXAMINATION:  GENERAL:  Pleasant and trim woman in no acute  distress.  VITAL SIGNS:  The weight is 120, unchanged.  Blood pressure 110/70,  heart rate 55 and regular, respirations 12 and unlabored.  NECK:  No  jugular venous distention.  LUNGS:  Clear.  CARDIAC:  Increased aortic component with second heart sound; normal  first heart sound; modest basilar systolic ejection murmur.  Slightly  prominent splitting of the second heart sound.  ABDOMEN:  Soft and nontender; no organomegaly.  EXTREMITIES:  No edema.   IMPRESSION:  Alicia Harrington is significantly improved in sinus rhythm.  It  will be necessary to maintain this.  We will wait for recurrence of  atrial fibrillation before restarting antiarrhythmic therapy.  The  choices would be flecainide at a higher dose which she previously took  or dofetilide.  Radiofrequency ablation in attempt at cure is also a  possible therapy,  although she is not inclined to undergo that  procedure.  I will reassess this nice woman again in 2 months.  We will  monitor anticoagulation in the clinic in the interim.     Gerrit Friends. Dietrich Pates, MD, [facility]  Electronically Signed    RMR/MedQ  DD: 01/31/2009  DT: 02/01/2009  Job #: [id number]   cc:   Ladona Horns. Mariel Sleet, MD

## 2011-04-08 NOTE — H&P (Signed)
[name], Alicia Harrington            ACCOUNT NO.:  [id number]   MEDICAL RECORD NO.:  [id number]          PATIENT TYPE:  INP   LOCATION:  IC04                          FACILITY:  [facility]   PHYSICIAN:  Dorris Singh, DO    DATE OF BIRTH:  Apr 30, 1932   DATE OF ADMISSION:  09/12/2008  DATE OF DISCHARGE:                              HISTORY & PHYSICAL   The patient is a 77 year old Caucasian female who was up at the [facility] this afternoon getting her chemotherapy for stage II  breast cancer with an approximate 3.5-cm primary with a high grade with  biopsy and lymph node confirmation by Dr. Isaiah Serge. She was up getting  her chemotherapy treatment when she became extremely hypoxic and started  not to feel well. A chest x-ray was done while she was upstairs, and she  was found to have pulmonary congestion. A CBC was also obtained prior to  her chemotherapy today and will get a chance to look at that as well.  While she was there, Dr. Mariel Sleet also called Dr. Dietrich Pates and gave him  a report as to what was going on and felt like the patient needed to be  hospitalized. Dr. Mariel Sleet called me, and we went ahead and admitted  patient directly into the ICU for further workup. Also, she started to  have hypoxia. I think she went down to 86% on 8 liters, and she was  given some Lasix upstairs.   PHYSICAL EXAMINATION:  The patient is a 77 year old in current mild  distress. She is on a nonbreather. Heart rate is tachy sinus.  LUNGS:  Are positive pulmonary congestion.  ABDOMEN:  Soft, nontender, nondistended.  EXTREMITIES:  Positive +4 pitting edema.   LABORATORY DATA:  Are pending currently - will obtain those, and chest x-  ray demonstrates pulmonary edema.   ASSESSMENT:  1. Congestive heart failure.  2. History of breast cancer.  3. Hypoxia.   PLAN:  Admit patient to the ICU. Will go ahead and place her on  nebulizer treatments and also diuretics. Will consult [facility]  as well as getting a 2-D echocardiogram if needed. Will continue to  monitor her saturations and put her on DVT and GI prophylaxis and will  also continue to monitor and change therapy as necessary.      Dorris Singh, DO  Electronically Signed     CB/MEDQ  D:  09/12/2008  T:  09/12/2008  Job:  [id number]

## 2011-04-08 NOTE — Assessment & Plan Note (Signed)
[facility]                       [place] CARDIOLOGY OFFICE NOTE   [name], [name]                   MRN:          [id number]  DATE:03/22/2009                            DOB:          July 07, 1932    Alicia Harrington returns to the office for continued assessment and  treatment of paroxysmal atrial fibrillation and cardiomyopathy, perhaps  chemotherapy induced.  She has continued to feel much better since her  cardioversion that was performed approximately 2 months ago.  She has  noted minimal dyspnea when climbing stairs.  She is preparing to travel  to the [address] and [place] for a month.  Anticoagulation has been  stable.   Medications are unchanged from her last visit except for the addition of  tamoxifen 20 mg daily.   On exam, pleasant, trim woman in no acute distress.  VITAL SIGNS:  The weight is 118, 2 pounds less than in March.  Blood  pressure 100/70, heart rate 110 and irregular.  Respirations 16 and  unlabored.  NECK:  No jugular venous distention; no carotid bruits.  LUNGS:  Clear.  CARDIAC:  Irregular rhythm; normal first and second heart sounds.  ABDOMEN:  Soft and nontender; no organomegaly.  EXTREMITIES:  No edema.  SKIN:  Slight hyperpigmentation and roughening of the skin over the  right back where radiation therapy has been performed.   RHYTHM STRIP:  Atrial fibrillation with a somewhat rapid response.   IMPRESSION:  [name] has reverted to atrial fibrillation, but  symptoms have not increased dramatically.  It may be that her problems  were more related to chemotherapy and cardiomyopathy then to her atrial  fibrillation.  We will increase metoprolol to 75 mg daily for better  control of heart rate.  An INR will be obtained.  If results are not  perfect, we will ask her to obtain a repeat INR during her month out of  town and relate the results to [place] for readjustment of her warfarin dose.  I will see this nice  woman again in 2 months.     Gerrit Friends. Dietrich Pates, MD, [facility]  Electronically Signed    RMR/MedQ  DD: 03/22/2009  DT: 03/23/2009  Job #: [id number]

## 2011-04-08 NOTE — Group Therapy Note (Signed)
NAMEJEMIMA, Alicia Harrington            ACCOUNT NO.:  [id number]   MEDICAL RECORD NO.:  [id number]          PATIENT TYPE:  INP   LOCATION:  A304                          FACILITY:  [facility]   PHYSICIAN:  Margaretmary Dys, M.D.DATE OF BIRTH:  1931/12/03   DATE OF PROCEDURE:  11/17/2008  DATE OF DISCHARGE:                                 PROGRESS NOTE   SUBJECTIVE:  The patient feels slightly better.  She continues to have  some sore throat.  The patient is currently getting Cepacol lozenges.  She denies any fevers or chills.  Her appetite has slightly improved.  She has no headaches.   OBJECTIVE:  GENERAL:  Conscious, alert, comfortable, not in acute  distress.  Well-oriented in time, place and person.  VITAL SIGNS:  Blood pressure is 117/67, pulse 97, respirations 18,  temperature 98.2 degrees Fahrenheit.  Oxygen saturation was 97% on room  air.  HEENT:  Normocephalic, atraumatic.  Oral mucosa was dry.  No exudates  were noted.  No thrush was seen.  NECK:  Supple.  No JVD or lymphadenopathy.  LUNGS:  Clinically good air entry bilaterally.  HEART:  S1-S2 regular.  No S3, S4, gallops or rubs.  ABDOMEN:  Soft, nontender.  Bowel sounds positive.  No masses palpable.  EXTREMITIES:  No edema.  CNS:  Grossly intact.  SKIN:  Diffuse petechiae rash has improved   LABORATORY/DIAGNOSTIC DATA:  White blood cell count was 1.1, hemoglobin  of 8.1, hematocrit 24.3, platelet count was 137.  Absolute neutrophil  count is now 200.  PT 36.2, INR 3.3.  Sodium 138, potassium 3.3,  chloride of 110, CO2 was 24, glucose of 88, BUN of 7, creatinine was  0.96.  Blood cultures remain negative.  Strep tip result was negative.   ASSESSMENT:  1. Neutropenic fever.  2. Severe dehydration due to poor oral intake.  3. History of her breast cancer on chemotherapy by Dr. Mariel Sleet.  4. Severe neutropenia   PLAN:  1. Continue IV antibiotics for now.  Will discontinue if cultures      remain negative and the patient  is no longer neutropenic.  2. Continue neutropenic precautions.  3. Continue with Coumadin dose, this has been adjusted by the      pharmacist.  4. I have restarted the patient's amiodarone from patient's initial      medication reconciliation list.  5. Will continue Neupogen and will discontinue once there is a      significant improvement in her white blood cell count.  Overall,      the patient remains stable.  She will remain in the hospital for      now.      Margaretmary Dys, M.D.  Electronically Signed     AM/MEDQ  D:  11/17/2008  T:  11/18/2008  Job:  [id number]

## 2011-04-11 NOTE — Assessment & Plan Note (Signed)
[facility]                         [place] CARDIOLOGY OFFICE NOTE   Alicia Harrington, Alicia Harrington                   MRN:          [id number]  DATE:07/02/2006                            DOB:          09-Oct-1932    Alicia Harrington returns to the office for continued assessment and treatment,  paroxysmal atrial fibrillation.  Since her last visit, she has felt quite  well.  She denies all cardiopulmonary symptoms.  She remains active  including recently hiking in [facility].  Pedal edema has  improved.  Her current medications include Warfarin and with stable and  therapeutic anticoagulation, Amlodipine 5 mg q.d., fish oil b.i.d., Toprol  25 mg q.d. and Flecainide 100 mg b.i.d.   EXAMINATION:  GENERAL:  Pleasant petite woman in no acute distress.  VITAL SIGNS:  The weight is 140, two pounds more than in May, blood pressure  110/70, heart rate 70 and regular, respirations 16.  NECK:  No jugular venous distention; no carotid bruits.  LUNGS:  Clear.  CARDIAC:  Normal first and second heart sounds; fourth heart sound present.  ABDOMEN:  Soft and nontender; no organomegaly.  EXTREMITIES:  Trace edema.   RHYTHM STRIP:  Sinus rhythm; first-degree AV block, normal QT interval.   Recently laboratory includes only a Flecainide level of 0.47 in June.   IMPRESSION:  Alicia Harrington is doing generally well.  Influenza vaccine will  be administered when available.  A CBC and stool for heme-occult testing  will be obtained to monitor anti-coagulation.  I will plan to see this nice  woman again in six months.                                   Gerrit Friends. Dietrich Pates, MD, [facility]   RMR/MedQ  DD:  07/02/2006  DT:  07/02/2006  Job #:  [id number]

## 2011-04-11 NOTE — Op Note (Signed)
   NAME:  Alicia, Harrington                      ACCOUNT NO.:  [id number]   MEDICAL RECORD NO.:  [id number]                   PATIENT TYPE:  AMB   LOCATION:  DAY                                  FACILITY:  [facility]   PHYSICIAN:  Lionel December, M.D.                 DATE OF BIRTH:  22-Jul-1932   DATE OF PROCEDURE:  09/12/2003  DATE OF DISCHARGE:                                 OPERATIVE REPORT   PROCEDURE:  Total colonoscopy.   INDICATIONS FOR PROCEDURE:  [name] is a 77 year old Caucasian female who is  undergoing screening colonoscopy.  The procedure and risks were reviewed  with the patient, and informed consent was obtained.   PREOPERATIVE MEDICATIONS:  Fentanyl 50 mcg IV, Versed 7 mg IV in divided  doses.   FINDINGS:  The procedure was performed in the endoscopy suite.  The  patient's vital signs and O2 saturations were monitored during the procedure  and remained stable.  The patient was placed in the left lateral recumbent  position and rectal examination performed.  She had a few soft sentinel skin  tags.  Digital exam was normal.  The Olympus videoscope was placed into the  rectum and advanced into the region of the sigmoid colon.  She had a few  small diverticula at the sigmoid colon.  The preparation in the low part was  excellent.  She had some liquid stool in the right colon.  The scope was  passed to the cecum which was identified by the appendiceal stump and  ileocecal valve.  Pictures were taken for the record.  As the scope was  withdrawn, the colonic mucosa was carefully examined, and there were no  polyps or tumor masses.  The rectal mucosa similarly was normal.  The scope  was retroflexed to examine the anorectal junction which was unremarkable.  The endoscope was straightened and withdrawn.  The patient tolerated the  procedure well.   FINAL DIAGNOSIS:  A few diverticula at sigmoid colon.  Otherwise, normal  colonoscopy.   RECOMMENDATIONS:  1. She will resume her  usual medications.  2. High fiber diet.  3. She should continue yearly Hemoccults and may consider her next screening     in 10 years from now.      ___________________________________________                                            Lionel December, M.D.   NR/MEDQ  D:  09/12/2003  T:  09/12/2003  Job:  [id number]   cc:   [place] Bing, M.D.

## 2011-04-11 NOTE — Assessment & Plan Note (Signed)
[facility]                       [place] CARDIOLOGY OFFICE NOTE   Alicia Harrington, Alicia Harrington                   MRN:          [id number]  DATE:01/06/2007                            DOB:          November 24, 1932    Alicia Harrington returns to the office for continued assessment and  treatment of paroxysmal atrial fibrillation. She has experienced no  palpitations. She does have chronic class II dyspnea on exertion. She is  planning travel to the mountains and to [place]. We discussed the  inadvisability of snow skiing. She developed peripheral and perhaps  pulmonary edema on a previous [place]. She will start  hydrochlorothiazide 25 mg daily and increase her potassium intake while  she is there. She will remain active on long plane flights, although her  use of warfarin probably protects her from DVT.   On exam, pleasant, trim woman in no acute distress. The weight is 139, 1  pound less than in August. Blood pressure 125/70, heart rate 70 and  regular, respirations 16.  NECK:  No jugular venous distention; normal carotid upstrokes without  bruits.  LUNGS:  Clear.  CARDIAC:  Normal first and second heart sounds; fourth heart sound  present.  ABDOMEN:  Soft and nontender. No organomegaly.  EXTREMITIES:  Trace if any edema.   Rhythm strip:  Normal sinus rhythm; first degree AV block.   IMPRESSION:  Alicia Harrington is doing well from a cardiac standpoint. We  will continue to adjust anticoagulant therapy and plan a return office  visit in 6 months. Stool for Hemoccult testing was negative 6 months  ago.     Gerrit Friends. Dietrich Pates, MD, [facility]  Electronically Signed    RMR/MedQ  DD: 01/06/2007  DT: 01/06/2007  Job #: [id number]

## 2011-04-11 NOTE — Procedures (Signed)
Alicia Harrington, Alicia Harrington            ACCOUNT NO.:  [id number]   MEDICAL RECORD NO.:  [id number]          PATIENT TYPE:  OBV   LOCATION:  IC06                          FACILITY:  [facility]   PHYSICIAN:  Edward L. Juanetta Gosling, M.D.DATE OF BIRTH:  06-02-1932   DATE OF PROCEDURE:  DATE OF DISCHARGE:  07/07/2005                                EKG INTERPRETATION   TIME OF STUDY:  1020, July 07, 2005.   The rhythm is sinus rhythm with PACs, and there is an area in the chest  leads that is sinus tachycardia.  QRS voltage is generally low.  There are  at least two different P-wave morphologies seen.  Q-T interval is prolonged,  which may be due to drug effect, electrolyte imbalance or primary myocardial  disease.  Abnormal electrocardiogram.      Oneal Deputy. Juanetta Gosling, M.D.  Electronically Signed     ELH/MEDQ  D:  07/08/2005  T:  07/08/2005  Job:  [id number]

## 2011-04-11 NOTE — Cardiovascular Report (Signed)
[facility]  Patient:    Alicia Harrington, Alicia Harrington                   MRN: [id number] Proc. Date: 03/18/00 Adm. Date:  [id number] Disc. Date: [id number] Attending:  Alric Quan CC:         Belia Heman. Kate Sable, M.D.             Gerrit Friends. Dietrich Pates, M.D. [facility]             Cardiac Catheterization Laboratory                        Cardiac Catheterization  INDICATIONS:  The patient is a 77 year old white married female with symptoms suggesting angina pectoris, somewhat atypical in that she has just retruned from a ski trip without any symptoms.  She had a negative Cardiolite in the past.  RESULTS:  PRESSURE:  LV systolic 112, diastolic 14.  AO systolic 112, diastolic 62.  ANGIOGRAPHY: 1. Left main:  The left main coronary artery is normal. 2. Left anterior descending:  The left anterior descending coronary normal. 3. Circumflex:  The circumflex coronary artery is normal. 4. Right coronary artery:  The right coronary artery is normal.  There is    some catheter tip spasm but no abnormality in the coronary artery. 5. Left ventricle:  The left ventricle is normal in the RAO view. 6. Abdominal aortogram:  The abdominal aortogram and renal arteries are    normal.  SUMMARY: 1. Normal selective coronary angiography. 2. Normal left ventricular angiography. 3. Normal abdominal aortography. DD:  03/18/00 TD:  03/18/00 Job: [id number]

## 2011-04-11 NOTE — Op Note (Signed)
Alicia Harrington, Alicia Harrington            ACCOUNT NO.:  [id number]   MEDICAL RECORD NO.:  [id number]          PATIENT TYPE:  OBV   LOCATION:  IC06                          FACILITY:  [facility]   PHYSICIAN:  [place] Bing, M.D. [facility] OF BIRTH:  31-Jan-1932   DATE OF PROCEDURE:  DATE OF DISCHARGE:  07/07/2005                                 OPERATIVE REPORT   REFERRING PHYSICIAN:  None.   After sedation with 7 mg of intravenous midazolam, a synchronized 120 joules  discharge was delivered via A-P pads.  The patient converted to sinus rhythm  with frequent PAC's and relatively frequent PVC's.  Eventually, PAC  frequency decreased dramatically, and PVC's resolved.   The patient remained stable throughout the procedure.  She was released from  observation status a few hours after emerging from sedation.      [place] Bing, M.D. [facility]  Electronically Signed     RR/MEDQ  D:  07/07/2005  T:  07/07/2005  Job:  [id number]

## 2011-04-11 NOTE — Assessment & Plan Note (Signed)
[facility]                                 ON-CALL NOTE   CAELEIGH, PROHASKA                     MRN:          [id number]  DATE:11/19/2007                            DOB:          03-03-1932    Patient of Dr. Marvel Plan.  She is on Coumadin for management of atrial  fibrillation.  She is 77 years old.  She has had no other significant  cardiac abnormalities per her husband, who is a physician.  She has  never had a stroke.  They are traveling and in [place] today.  They got  results back of an INR, which is 3.5.  Her usual regimen is 7.5 mg of  Coumadin Monday and Friday, and 10 mg the other days.  I instructed them  to skip today's dose.  They are going to resume the Coumadin tomorrow at  7.5 mg Saturday and Sunday, then change the regimen starting with 7.5 mg  Tuesday, Thursday, and Saturday, and 10 mg other days.  They will be  traveling through [facility] in about 3 to 4 days.  He can arrange to  get another Coumadin check there with the results sent to Dr. Dietrich Pates.     Rollene Rotunda, MD, [facility]  Electronically Signed    JH/MedQ  DD: 11/19/2007  DT: 11/19/2007  Job #: [id number]   cc:   Gerrit Friends. Dietrich Pates, MD, [facility]

## 2011-04-11 NOTE — Assessment & Plan Note (Signed)
[place] [facility]                            CARDIOLOGY OFFICE NOTE   MONETTA, LICK                   MRN:          [id number]  DATE:02/15/2007                            DOB:          1932/05/11    The patient is a very pleasant 77 year old, white, married female with a  history of paroxysmal atrial fibrillation having had D/C cardioversion  on two occasions in the past, though has had no symptoms during the past  two years.  She has been under extreme stress recently, was planning a  trip on Saturday.  She noticed some recurrent dizziness usually with  motion over the past few days and is here for evaluation.  I did a  cardiac cath on her on March 18, 2000, revealing normal coronary  arteries, normal LV function, normal abdominal aorta.  The patient is  having no chest discomfort.   She is on:  1. Coumadin.  2. Norvasc 5.  3. Fish oil.  4. Toprol XL 25.  5. Flecainide 100 b.i.d.   PHYSICAL EXAMINATION:  VITAL SIGNS:  Blood pressure is 126/60, pulse  normal sinus rhythm.  GENERAL APPEARANCE:  Normal.  NECK:  JVP is not elevated.  Carotid pulses palpable and equal without  bruits.  LUNGS:  Clear.  CARDIAC:  Reveals no murmur or gallop.  ABDOMEN:  Unremarkable.  EXTREMITIES:  Normal.   EKG revealed a normal sinus rhythm, first degree AV block.  She has a  left anterior fascicular block which is changed from a previous EKG.  There are no acute ischemic changes.   IMPRESSION:  1. Recurrent dizziness of undetermined etiology.  2. History of paroxysmal atrial fibrillation without recent symptoms.      a.     Normal coronary angiography.  3. Stress.   I have suggested a BMP, CBC, and event monitor.  I do not think this is  related to arrhythmia, she does have a first degree AV block and left  anterior hemiblock.     Cecil Cranker, MD, [facility]  Electronically Signed   EJL/MedQ  DD: 02/15/2007  DT: 02/15/2007  Job #: [id number]

## 2011-04-11 NOTE — Consult Note (Signed)
Alicia Harrington, Alicia Harrington            ACCOUNT NO.:  [id number]   MEDICAL RECORD NO.:  [id number]          PATIENT TYPE:  AMB   LOCATION:  DAY                           FACILITY:  [facility]   PHYSICIAN:  Lionel December, M.D.    DATE OF BIRTH:  Mar 28, 1932   DATE OF CONSULTATION:  DATE OF DISCHARGE:                                   CONSULTATION   PRESENTING COMPLAINT:  Dysphagia.   HISTORY OF THE PRESENT ILLNESS:  Alicia Harrington is a 77 year old Caucasian female  who is referred through the courtesy of Dr. Dietrich Pates for evaluation of  dysphagia.  Alicia Harrington states that she has had this problem off and on for the  last few years, but has been occurring more and more frequently, maybe once  a week lately.  She states she is spending more time chewing her food and  she actually has noted improvement since this appointment was made.  At  times she has difficulty with pills.  She points to her suprasternal area as  the site of bullous obstruction.  Her food always goes down.  She has never  had an episode of food impaction relieved with regurgitation.  She has some  heartburn at a frequency of less than three times a week; however, she has  frequent hoarseness and coughing spells.  She also complains of a band-like  pain across her upper abdomen more noticeable with change in position and  felt to be a muscle spasm.  She denies melena or rectal bleeding.  She has  her hemoccults regularly and they have always been negative.  She has a good  appetite.  She denies recent weight loss.   MEDICATIONS:  The medications patient is on consists of:  1.  Norvasc 5 mg daily.  2.  Toprol 25 mg daily.  3.  Flecainide 50 mg three times a day.  4.  Coumadin 7.5 mg four days a week and 10 mg three days a week.  5.  Prempro 0.3/1.5 mg daily.  6.  Fish oil 1 capsule daily.  7.  Calcium 600 mg daily.  8.  Glucosamine 1.5 grams daily.  9.  Chondroitin 1.2 grams daily.   PAST MEDICAL HISTORY:  1.  The patient has  hypertension, which was diagnosed five years ago.  2.  The patient had a cardiac cath three or four years ago, which was      normal.  3.  Last year when she was in [address] she developed chest pain and      diaphoresis.  She was ruled out for myocardial infarct, but she was      diagnosed with new onset atrial fibrillation.  She has been cardioverted      twice with the first cardioversion in August 2006, which lasted for four      months and most recently she was cardioverted four weeks ago and remains      in normal sinus rhythm.  4.  The patient had screening colonoscopy in October 2004, which revealed a      few diverticula in the sigmoid colon.  5.  Jamesetta So  was treated for diarrhea secondary to Campylobacter in July      2005.   PAST SURGICAL HISTORY:  Prior surgeries include:  1.  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  2.  The patient had a hysteroscopy 15 years ago.   ALLERGIES:  No known allergies.   FAMILY HISTORY:  Mother had rheumatic heart disease and died at age 46.  Father lived to be 52.  She has a brother living;  however, another brother  died of a CVA in his late 40s.  She is married.  She has three children.  One son has frequent pancreatitis, etiology unknown, or at least she does  not know it.  She is a retired __________ .  She has never smoked  cigarettes.  She drinks a glass of wine daily.   PHYSICAL EXAMINATION:  GENERAL APPEARANCE:  This is a pleasant, well-  developed, well-nourished Caucasian female who is in no acute distress.  VITAL SIGNS:  The patient weighs 143 pounds.  She is 5 feet 1 inch tall.  Pulse 72, pulmonary regular, blood pressure 128/74 and temp 97.7.  HEENT:  Conjunctivae is pink.  Sclerae is nonicteric.  Oropharyngeal mucosa  is normal.  Dentition is in satisfactory condition.  NECK:  No neck masses or thyromegaly noted.  HEART:  Cardiac exam showed regular rhythm with normal S1 and S2.  No murmur  or  gallop noted.  LUNGS:  The lungs are clear to auscultation.  ABDOMEN:  The patient's abdomen is symmetrical and soft without tenderness,  organomegaly or masses.  EXTREMITIES:  The patient does not have peripheral edema.   ASSESSMENT:  Alicia Harrington presents with intermittent dysphagia to solids and  pills, and she points to her suprasternal area.  These symptoms actually  have improves since she s spending a few more minutes chewing her food.  She  does have infrequent heartburn; however, she has frequent hoarseness and  cough.  I suspect these are atypical manifestations of gastroesophageal  reflux disease.  She does not have any alarm symptoms.   Since she is on Coumadin it would be reasonable to proceed with a barium  pill study and if the pill study is entirely normal we may just want to  watch her rather than doing and esophagogastroduodenoscopy and esophageal  dilation or manometry.   RECOMMENDATIONS:  1.  Antireflux measures.  2.  Prevacid 30 mg p.o. daily; samples given.  3.  Barium pill study.  If this study shows that she has a ring or stricture      she will need esophageal dilation, otherwise we may just watch her for a      few months and see how she does.   We would like to thank Dr. Dietrich Pates for the opportunity to participate in  Alicia Harrington' care.      Lionel December, M.D.  Electronically Signed     NR/MEDQ  D:  03/19/2006  T:  03/20/2006  Job:  [id number]   cc:   [place] Bing, M.D. [facility]  [address]  [address]  [place]  [place] [address]   Richardean Canal, M.D.  Fax: [phone]

## 2011-04-14 ENCOUNTER — Other Ambulatory Visit: Payer: Self-pay | Admitting: Cardiology

## 2011-04-16 ENCOUNTER — Ambulatory Visit (INDEPENDENT_AMBULATORY_CARE_PROVIDER_SITE_OTHER): Payer: Medicare Other | Admitting: *Deleted

## 2011-04-16 DIAGNOSIS — Z7901 Long term (current) use of anticoagulants: Secondary | ICD-10-CM

## 2011-04-16 DIAGNOSIS — I4891 Unspecified atrial fibrillation: Secondary | ICD-10-CM

## 2011-04-16 HISTORY — DX: Long term (current) use of anticoagulants: Z79.01

## 2011-04-16 LAB — POCT INR: INR: 3.3

## 2011-04-18 ENCOUNTER — Other Ambulatory Visit (HOSPITAL_COMMUNITY): Payer: Medicare Other

## 2011-04-22 ENCOUNTER — Other Ambulatory Visit (HOSPITAL_COMMUNITY): Payer: Self-pay | Admitting: Oncology

## 2011-04-22 ENCOUNTER — Encounter (HOSPITAL_COMMUNITY): Payer: Medicare Other | Attending: Oncology

## 2011-04-22 DIAGNOSIS — I4891 Unspecified atrial fibrillation: Secondary | ICD-10-CM | POA: Insufficient documentation

## 2011-04-22 DIAGNOSIS — Z7901 Long term (current) use of anticoagulants: Secondary | ICD-10-CM | POA: Insufficient documentation

## 2011-04-22 DIAGNOSIS — Z09 Encounter for follow-up examination after completed treatment for conditions other than malignant neoplasm: Secondary | ICD-10-CM | POA: Insufficient documentation

## 2011-04-22 DIAGNOSIS — Z853 Personal history of malignant neoplasm of breast: Secondary | ICD-10-CM | POA: Insufficient documentation

## 2011-04-22 DIAGNOSIS — C50919 Malignant neoplasm of unspecified site of unspecified female breast: Secondary | ICD-10-CM

## 2011-04-22 DIAGNOSIS — Z79899 Other long term (current) drug therapy: Secondary | ICD-10-CM | POA: Insufficient documentation

## 2011-04-22 LAB — COMPREHENSIVE METABOLIC PANEL
AST: 21 U/L (ref 0–37)
BUN: 16 mg/dL (ref 6–23)
CO2: 30 mEq/L (ref 19–32)
Calcium: 9.9 mg/dL (ref 8.4–10.5)
Chloride: 101 mEq/L (ref 96–112)
Creatinine, Ser: 0.84 mg/dL (ref 0.4–1.2)
GFR calc Af Amer: >60 mL/min (ref >60)
GFR calc non Af Amer: >60 mL/min (ref >60)
Glucose, Bld: 98 mg/dL (ref 70–99)
Sodium: 138 mEq/L (ref 135–145)
Total Bilirubin: 1.1 mg/dL (ref 0.3–1.2)

## 2011-04-22 LAB — SEDIMENTATION RATE: Sed Rate: 30 mm/hr — ABNORMAL HIGH (ref 0–22)

## 2011-04-22 LAB — CBC
Platelets: 167 10*3/uL (ref 150–400)
RDW: 13.6 % (ref 11.5–15.5)
WBC: 4.1 10*3/uL (ref 4.0–10.5)

## 2011-04-23 ENCOUNTER — Ambulatory Visit (HOSPITAL_COMMUNITY): Payer: Self-pay | Admitting: Oncology

## 2011-05-02 LAB — PROTIME-INR: INR: 2.8 — AB (ref 0.9–1.1)

## 2011-05-05 ENCOUNTER — Ambulatory Visit (INDEPENDENT_AMBULATORY_CARE_PROVIDER_SITE_OTHER): Payer: Self-pay | Admitting: *Deleted

## 2011-05-05 DIAGNOSIS — R0989 Other specified symptoms and signs involving the circulatory and respiratory systems: Secondary | ICD-10-CM

## 2011-05-29 ENCOUNTER — Ambulatory Visit (INDEPENDENT_AMBULATORY_CARE_PROVIDER_SITE_OTHER): Payer: Medicare Other | Admitting: *Deleted

## 2011-05-29 DIAGNOSIS — I4891 Unspecified atrial fibrillation: Secondary | ICD-10-CM

## 2011-05-29 LAB — POCT INR: INR: 3

## 2011-06-26 ENCOUNTER — Ambulatory Visit (INDEPENDENT_AMBULATORY_CARE_PROVIDER_SITE_OTHER): Payer: Medicare Other | Admitting: *Deleted

## 2011-06-26 DIAGNOSIS — I4891 Unspecified atrial fibrillation: Secondary | ICD-10-CM

## 2011-06-26 LAB — POCT INR: INR: 3.7

## 2011-07-10 ENCOUNTER — Ambulatory Visit (INDEPENDENT_AMBULATORY_CARE_PROVIDER_SITE_OTHER): Payer: Medicare Other | Admitting: *Deleted

## 2011-07-10 DIAGNOSIS — I4891 Unspecified atrial fibrillation: Secondary | ICD-10-CM

## 2011-07-31 ENCOUNTER — Ambulatory Visit (INDEPENDENT_AMBULATORY_CARE_PROVIDER_SITE_OTHER): Payer: Medicare Other | Admitting: *Deleted

## 2011-07-31 DIAGNOSIS — I4891 Unspecified atrial fibrillation: Secondary | ICD-10-CM

## 2011-08-12 ENCOUNTER — Encounter: Payer: Self-pay | Admitting: Cardiology

## 2011-08-21 LAB — BASIC METABOLIC PANEL
BUN: 15
Calcium: 8.8
Creatinine, Ser: 0.77
GFR calc non Af Amer: >60
Glucose, Bld: 84

## 2011-08-21 LAB — DIFFERENTIAL
Basophils Absolute: 0
Basophils Relative: 0
Eosinophils Absolute: 0
Eosinophils Relative: 1
Eosinophils Relative: 1
Lymphocytes Relative: 20
Lymphs Abs: 1.3
Monocytes Absolute: 0.3
Monocytes Relative: 7
Neutro Abs: 4.6
Neutrophils Relative %: 65
Neutrophils Relative %: 72

## 2011-08-21 LAB — CBC
Hemoglobin: 15.1 — ABNORMAL HIGH
MCHC: 36
Platelets: 191
RBC: 4.36
RDW: 13.8
RDW: 13.6
WBC: 6.3

## 2011-08-21 LAB — COMPREHENSIVE METABOLIC PANEL
ALT: 28
AST: 25
Alkaline Phosphatase: 98
Calcium: 9.1
GFR calc Af Amer: >60
Glucose, Bld: 97
Potassium: 4.1
Sodium: 141
Total Protein: 6.6

## 2011-08-21 LAB — PROTIME-INR
INR: >10.8
Prothrombin Time: >90 — ABNORMAL HIGH

## 2011-08-21 LAB — CANCER ANTIGEN 27.29: CA 27.29: 17

## 2011-08-21 LAB — APTT: aPTT: 61 — ABNORMAL HIGH

## 2011-08-22 LAB — CBC
MCV: 97.6
MCV: 99.6
Platelets: 329
RBC: 3.18 — ABNORMAL LOW
WBC: 7.6
WBC: 6.7

## 2011-08-22 LAB — COMPREHENSIVE METABOLIC PANEL
ALT: 34
AST: 34
CO2: 26
Chloride: 108
Creatinine, Ser: 0.66
GFR calc Af Amer: >60
GFR calc non Af Amer: >60
Sodium: 141
Total Bilirubin: 0.4

## 2011-08-22 LAB — DIFFERENTIAL
Basophils Absolute: 0
Eosinophils Absolute: 0
Eosinophils Absolute: 0
Eosinophils Relative: 0
Lymphs Abs: 0.7
Neutro Abs: 6.4
Neutrophils Relative %: 84 — ABNORMAL HIGH

## 2011-08-25 LAB — COMPREHENSIVE METABOLIC PANEL
ALT: 28
Alkaline Phosphatase: 65
Alkaline Phosphatase: 78
BUN: 15
BUN: 8
CO2: 25
CO2: 25
Calcium: 8.8
GFR calc non Af Amer: >60
GFR calc non Af Amer: >60
Glucose, Bld: 88
Glucose, Bld: 101 — ABNORMAL HIGH
Potassium: 3.4 — ABNORMAL LOW
Potassium: 3.7
Total Bilirubin: 0.6
Total Protein: 5.6 — ABNORMAL LOW
Total Protein: 5.1 — ABNORMAL LOW

## 2011-08-25 LAB — BLOOD GAS, ARTERIAL
Acid-Base Excess: 5.5 — ABNORMAL HIGH
FIO2: 1
O2 Content: 3
O2 Saturation: 94.4
Patient temperature: 37
TCO2: 20
pCO2 arterial: 31.7 — ABNORMAL LOW
pH, Arterial: 7.461 — ABNORMAL HIGH

## 2011-08-25 LAB — CBC
HCT: 26.7 — ABNORMAL LOW
HCT: 29.7 — ABNORMAL LOW
HCT: 30.3 — ABNORMAL LOW
HCT: 30.3 — ABNORMAL LOW
Hemoglobin: 9 — ABNORMAL LOW
Hemoglobin: 9.1 — ABNORMAL LOW
Hemoglobin: 10.1 — ABNORMAL LOW
Hemoglobin: 10.4 — ABNORMAL LOW
MCHC: 34.1
MCHC: 34.4
MCHC: 33.4
MCHC: 33.5
MCHC: 33.9
MCV: 107.1 — ABNORMAL HIGH
MCV: 107.1 — ABNORMAL HIGH
MCV: 106.3 — ABNORMAL HIGH
MCV: 107.4 — ABNORMAL HIGH
MCV: 107.8 — ABNORMAL HIGH
Platelets: 231
Platelets: 177
Platelets: 182
Platelets: 192
RBC: 2.47 — ABNORMAL LOW
RBC: 2.47 — ABNORMAL LOW
RBC: 2.8 — ABNORMAL LOW
RBC: 2.9 — ABNORMAL LOW
RDW: 18.2 — ABNORMAL HIGH
RDW: 19.8 — ABNORMAL HIGH
RDW: 17.7 — ABNORMAL HIGH
RDW: 17.9 — ABNORMAL HIGH
RDW: 18.6 — ABNORMAL HIGH
RDW: 18.6 — ABNORMAL HIGH
WBC: 3.7 — ABNORMAL LOW

## 2011-08-25 LAB — DIFFERENTIAL
Basophils Absolute: 0
Basophils Absolute: 0
Basophils Absolute: 0
Basophils Absolute: 0
Basophils Absolute: 0
Basophils Absolute: 0.1
Basophils Relative: 0
Basophils Relative: 0
Basophils Relative: 0
Basophils Relative: 0
Basophils Relative: 0
Basophils Relative: 1
Basophils Relative: 1
Eosinophils Absolute: 0
Eosinophils Absolute: 0.1
Eosinophils Absolute: 0
Eosinophils Absolute: 0
Eosinophils Absolute: 0
Eosinophils Relative: 0
Eosinophils Relative: 2
Eosinophils Relative: 4
Lymphocytes Relative: 14
Lymphocytes Relative: 7 — ABNORMAL LOW
Lymphs Abs: 0.4 — ABNORMAL LOW
Monocytes Absolute: 0.6
Monocytes Absolute: 0.1
Monocytes Absolute: 0.2
Monocytes Absolute: 0.3
Monocytes Relative: 10
Monocytes Relative: 13 — ABNORMAL HIGH
Monocytes Relative: 13 — ABNORMAL HIGH
Monocytes Relative: 5
Monocytes Relative: 5
Monocytes Relative: 8
Neutro Abs: 3.3
Neutro Abs: 4.1
Neutro Abs: 4.8
Neutro Abs: 2.5
Neutro Abs: 2.7
Neutro Abs: 2.7
Neutrophils Relative %: 79 — ABNORMAL HIGH
Neutrophils Relative %: 81 — ABNORMAL HIGH
Neutrophils Relative %: 76
Neutrophils Relative %: 78 — ABNORMAL HIGH
Neutrophils Relative %: 85 — ABNORMAL HIGH
Neutrophils Relative %: 88 — ABNORMAL HIGH

## 2011-08-25 LAB — BASIC METABOLIC PANEL
BUN: 12
BUN: 12
BUN: 13
BUN: 18
CO2: 30
CO2: 32
Calcium: 8.1 — ABNORMAL LOW
Calcium: 8.2 — ABNORMAL LOW
Calcium: 8.5
Calcium: 8.7
Chloride: 106
Creatinine, Ser: 0.65
Creatinine, Ser: 0.71
Creatinine, Ser: 0.95
GFR calc Af Amer: >60
GFR calc non Af Amer: >60
GFR calc non Af Amer: >60
GFR calc non Af Amer: >60
GFR calc non Af Amer: >60
Glucose, Bld: 104 — ABNORMAL HIGH
Glucose, Bld: 89
Glucose, Bld: 93
Glucose, Bld: 95
Potassium: 4.3
Sodium: 141

## 2011-08-25 LAB — CARDIAC PANEL(CRET KIN+CKTOT+MB+TROPI)
CK, MB: 1.5
CK, MB: 2.1
Relative Index: INVALID
Total CK: 28
Troponin I: 0.05
Troponin I: 0.06

## 2011-08-25 LAB — PROTIME-INR
INR: 1.1
INR: 2.5 — ABNORMAL HIGH
INR: 1.7 — ABNORMAL HIGH
INR: 2.4 — ABNORMAL HIGH
Prothrombin Time: 14.1
Prothrombin Time: 14.8
Prothrombin Time: 19.4 — ABNORMAL HIGH
Prothrombin Time: 28.2 — ABNORMAL HIGH
Prothrombin Time: 36.2 — ABNORMAL HIGH

## 2011-08-25 LAB — PHOSPHORUS: Phosphorus: 3.8

## 2011-08-25 LAB — LIPID PANEL
HDL: 41
Triglycerides: 61
VLDL: 12

## 2011-08-25 LAB — APTT: aPTT: 29

## 2011-08-25 LAB — T4, FREE: Free T4: 1.26

## 2011-08-25 LAB — TSH
TSH: 0.082 — ABNORMAL LOW
TSH: 0.337 — ABNORMAL LOW

## 2011-08-26 LAB — DIFFERENTIAL
Basophils Absolute: 0
Basophils Relative: 1
Eosinophils Absolute: 0
Eosinophils Absolute: 0.1
Eosinophils Relative: 2
Lymphocytes Relative: 19
Lymphs Abs: 0.5 — ABNORMAL LOW
Lymphs Abs: 0.5 — ABNORMAL LOW
Lymphs Abs: 0.7
Monocytes Absolute: 0.2
Monocytes Absolute: 0.5
Monocytes Relative: 11
Monocytes Relative: 19 — ABNORMAL HIGH
Neutro Abs: 1 — ABNORMAL LOW
Neutro Abs: 1.6 — ABNORMAL LOW
Neutrophils Relative %: 59
Neutrophils Relative %: 60
Neutrophils Relative %: 77

## 2011-08-26 LAB — CBC
HCT: 32.2 — ABNORMAL LOW
HCT: 33 — ABNORMAL LOW
Hemoglobin: 10.4 — ABNORMAL LOW
Hemoglobin: 10.7 — ABNORMAL LOW
Hemoglobin: 11.1 — ABNORMAL LOW
MCHC: 33.3
MCHC: 33.9
MCV: 104.3 — ABNORMAL HIGH
MCV: 104.3 — ABNORMAL HIGH
MCV: 106 — ABNORMAL HIGH
Platelets: 236
RBC: 2.9 — ABNORMAL LOW
RBC: 3.08 — ABNORMAL LOW
RBC: 3.16 — ABNORMAL LOW
RDW: 16.4 — ABNORMAL HIGH
WBC: 1.7 — ABNORMAL LOW
WBC: 2.7 — ABNORMAL LOW

## 2011-08-26 LAB — COMPREHENSIVE METABOLIC PANEL WITH GFR
ALT: 17
AST: 27
Albumin: 3.3 — ABNORMAL LOW
Alkaline Phosphatase: 81
BUN: 12
CO2: 26
Calcium: 8.9
Chloride: 107
Creatinine, Ser: 0.7
GFR calc Af Amer: >60
GFR calc non Af Amer: >60
Glucose, Bld: 95
Potassium: 3.4 — ABNORMAL LOW
Sodium: 141
Total Bilirubin: 0.7
Total Protein: 5.3 — ABNORMAL LOW

## 2011-08-26 LAB — PROTIME-INR
INR: 2.5 — ABNORMAL HIGH
INR: 2.8 — ABNORMAL HIGH
INR: 3.1 — ABNORMAL HIGH
Prothrombin Time: 28.6 — ABNORMAL HIGH
Prothrombin Time: 31.3 — ABNORMAL HIGH
Prothrombin Time: 34.7 — ABNORMAL HIGH

## 2011-08-27 ENCOUNTER — Encounter: Payer: Self-pay | Admitting: Cardiology

## 2011-08-27 LAB — CBC
HCT: 26.1 — ABNORMAL LOW
Platelets: 92 — ABNORMAL LOW
WBC: 5.9

## 2011-08-27 LAB — PROTIME-INR
INR: 2 — ABNORMAL HIGH
INR: 2.4 — ABNORMAL HIGH
Prothrombin Time: 22 — ABNORMAL HIGH
Prothrombin Time: 24.2 — ABNORMAL HIGH
Prothrombin Time: 27.6 — ABNORMAL HIGH

## 2011-08-27 LAB — BASIC METABOLIC PANEL
BUN: 15
Creatinine, Ser: 0.69
GFR calc non Af Amer: >60
Potassium: 3.8

## 2011-08-27 LAB — APTT: aPTT: 39 — ABNORMAL HIGH

## 2011-08-28 ENCOUNTER — Ambulatory Visit (INDEPENDENT_AMBULATORY_CARE_PROVIDER_SITE_OTHER): Payer: Self-pay | Admitting: *Deleted

## 2011-08-28 DIAGNOSIS — R0989 Other specified symptoms and signs involving the circulatory and respiratory systems: Secondary | ICD-10-CM

## 2011-08-28 LAB — DIFFERENTIAL
Band Neutrophils: 0 % (ref 0–10)
Basophils Absolute: 0 10*3/uL (ref 0.0–0.1)
Basophils Absolute: 0 10*3/uL (ref 0.0–0.1)
Basophils Absolute: 0 10*3/uL (ref 0.0–0.1)
Basophils Absolute: 0 10*3/uL (ref 0.0–0.1)
Basophils Absolute: 0 10*3/uL (ref 0.0–0.1)
Basophils Relative: 0 % (ref 0–1)
Basophils Relative: 0 % (ref 0–1)
Basophils Relative: 0 % (ref 0–1)
Basophils Relative: 0 % (ref 0–1)
Basophils Relative: 1 % (ref 0–1)
Eosinophils Absolute: 0 10*3/uL (ref 0.0–0.7)
Eosinophils Absolute: 0 10*3/uL (ref 0.0–0.7)
Eosinophils Absolute: 0 10*3/uL (ref 0.0–0.7)
Eosinophils Absolute: 0 10*3/uL (ref 0.0–0.7)
Eosinophils Absolute: 0 10*3/uL (ref 0.0–0.7)
Eosinophils Absolute: 0.1 10*3/uL (ref 0.0–0.7)
Eosinophils Relative: 0 % (ref 0–5)
Eosinophils Relative: 0 % (ref 0–5)
Eosinophils Relative: 0 % (ref 0–5)
Eosinophils Relative: 1 % (ref 0–5)
Eosinophils Relative: 1 % (ref 0–5)
Eosinophils Relative: 2 % (ref 0–5)
Eosinophils Relative: 2 % (ref 0–5)
Lymphocytes Relative: 13 % (ref 12–46)
Lymphocytes Relative: 18 % (ref 12–46)
Lymphocytes Relative: 30 % (ref 12–46)
Lymphocytes Relative: 31 % (ref 12–46)
Lymphocytes Relative: 42 % (ref 12–46)
Lymphocytes Relative: 50 % — ABNORMAL HIGH (ref 12–46)
Lymphs Abs: 0.2 10*3/uL — ABNORMAL LOW (ref 0.7–4.0)
Lymphs Abs: 0.2 10*3/uL — ABNORMAL LOW (ref 0.7–4.0)
Lymphs Abs: 0.3 10*3/uL — ABNORMAL LOW (ref 0.7–4.0)
Lymphs Abs: 0.6 10*3/uL — ABNORMAL LOW (ref 0.7–4.0)
Lymphs Abs: 0.6 10*3/uL — ABNORMAL LOW (ref 0.7–4.0)
Metamyelocytes Relative: 0 %
Monocytes Absolute: 0 10*3/uL — ABNORMAL LOW (ref 0.1–1.0)
Monocytes Absolute: 0.1 10*3/uL (ref 0.1–1.0)
Monocytes Absolute: 0.2 10*3/uL (ref 0.1–1.0)
Monocytes Absolute: 0.2 10*3/uL (ref 0.1–1.0)
Monocytes Absolute: 0.5 10*3/uL (ref 0.1–1.0)
Monocytes Absolute: 1 10*3/uL (ref 0.1–1.0)
Monocytes Absolute: 1.7 10*3/uL — ABNORMAL HIGH (ref 0.1–1.0)
Monocytes Relative: 36 % — ABNORMAL HIGH (ref 3–12)
Monocytes Relative: 42 % — ABNORMAL HIGH (ref 3–12)
Monocytes Relative: 48 % — ABNORMAL HIGH (ref 3–12)
Monocytes Relative: 8 % (ref 3–12)
Myelocytes: 0 %
Neutro Abs: 0.1 10*3/uL — ABNORMAL LOW (ref 1.7–7.7)
Neutro Abs: 0.2 10*3/uL — ABNORMAL LOW (ref 1.7–7.7)
Neutro Abs: 19.3 10*3/uL — ABNORMAL HIGH (ref 1.7–7.7)
Neutrophils Relative %: 13 % — ABNORMAL LOW (ref 43–77)
Neutrophils Relative %: 70 % (ref 43–77)
WBC Morphology: INCREASED
nRBC: 0 /100 WBC

## 2011-08-28 LAB — CBC
HCT: 23.9 % — ABNORMAL LOW (ref 36.0–46.0)
HCT: 24.3 % — ABNORMAL LOW (ref 36.0–46.0)
HCT: 26.2 % — ABNORMAL LOW (ref 36.0–46.0)
HCT: 26.3 % — ABNORMAL LOW (ref 36.0–46.0)
HCT: 28.5 % — ABNORMAL LOW (ref 36.0–46.0)
HCT: 30.3 % — ABNORMAL LOW (ref 36.0–46.0)
HCT: 32.3 % — ABNORMAL LOW (ref 36.0–46.0)
Hemoglobin: 10.9 g/dL — ABNORMAL LOW (ref 12.0–15.0)
Hemoglobin: 8.1 g/dL — ABNORMAL LOW (ref 12.0–15.0)
Hemoglobin: 8.1 g/dL — ABNORMAL LOW (ref 12.0–15.0)
Hemoglobin: 9.1 g/dL — ABNORMAL LOW (ref 12.0–15.0)
Hemoglobin: 9.6 g/dL — ABNORMAL LOW (ref 12.0–15.0)
MCHC: 32.9 g/dL (ref 30.0–36.0)
MCHC: 33.1 g/dL (ref 30.0–36.0)
MCHC: 33.2 g/dL (ref 30.0–36.0)
MCHC: 33.3 g/dL (ref 30.0–36.0)
MCHC: 33.5 g/dL (ref 30.0–36.0)
MCHC: 33.6 g/dL (ref 30.0–36.0)
MCHC: 33.7 g/dL (ref 30.0–36.0)
MCV: 100.8 fL — ABNORMAL HIGH (ref 78.0–100.0)
MCV: 100.9 fL — ABNORMAL HIGH (ref 78.0–100.0)
MCV: 101.6 fL — ABNORMAL HIGH (ref 78.0–100.0)
MCV: 102.3 fL — ABNORMAL HIGH (ref 78.0–100.0)
MCV: 102.7 fL — ABNORMAL HIGH (ref 78.0–100.0)
MCV: 103 fL — ABNORMAL HIGH (ref 78.0–100.0)
Platelets: 139 10*3/uL — ABNORMAL LOW (ref 150–400)
Platelets: 174 10*3/uL (ref 150–400)
Platelets: 194 10*3/uL (ref 150–400)
Platelets: 228 10*3/uL (ref 150–400)
RBC: 2.37 MIL/uL — ABNORMAL LOW (ref 3.87–5.11)
RBC: 2.4 MIL/uL — ABNORMAL LOW (ref 3.87–5.11)
RBC: 2.63 MIL/uL — ABNORMAL LOW (ref 3.87–5.11)
RBC: 2.67 MIL/uL — ABNORMAL LOW (ref 3.87–5.11)
RBC: 2.8 MIL/uL — ABNORMAL LOW (ref 3.87–5.11)
RDW: 16.6 % — ABNORMAL HIGH (ref 11.5–15.5)
RDW: 16.6 % — ABNORMAL HIGH (ref 11.5–15.5)
RDW: 16.7 % — ABNORMAL HIGH (ref 11.5–15.5)
RDW: 16.7 % — ABNORMAL HIGH (ref 11.5–15.5)
RDW: 17.2 % — ABNORMAL HIGH (ref 11.5–15.5)
RDW: 17.3 % — ABNORMAL HIGH (ref 11.5–15.5)
RDW: 17.8 % — ABNORMAL HIGH (ref 11.5–15.5)
WBC: 0.4 10*3/uL — CL (ref 4.0–10.5)
WBC: 0.5 10*3/uL — CL (ref 4.0–10.5)
WBC: 0.6 10*3/uL — CL (ref 4.0–10.5)

## 2011-08-28 LAB — PROTIME-INR
INR: 2.6 — ABNORMAL HIGH (ref 0.00–1.49)
INR: 2.6 — ABNORMAL HIGH (ref 0.00–1.49)
INR: 3.3 — ABNORMAL HIGH (ref 0.00–1.49)
Prothrombin Time: 38.3 seconds — ABNORMAL HIGH (ref 11.6–15.2)
Prothrombin Time: >90 seconds — ABNORMAL HIGH (ref 11.6–15.2)

## 2011-08-28 LAB — BASIC METABOLIC PANEL
BUN: 13 mg/dL (ref 6–23)
BUN: 3 mg/dL — ABNORMAL LOW (ref 6–23)
BUN: 4 mg/dL — ABNORMAL LOW (ref 6–23)
CO2: 22 mEq/L (ref 19–32)
CO2: 22 mEq/L (ref 19–32)
CO2: 24 mEq/L (ref 19–32)
Calcium: 7.9 mg/dL — ABNORMAL LOW (ref 8.4–10.5)
Chloride: 106 mEq/L (ref 96–112)
Chloride: 112 mEq/L (ref 96–112)
Chloride: 113 mEq/L — ABNORMAL HIGH (ref 96–112)
Chloride: 117 mEq/L — ABNORMAL HIGH (ref 96–112)
Creatinine, Ser: 1.1 mg/dL (ref 0.4–1.2)
Creatinine, Ser: 1.17 mg/dL (ref 0.4–1.2)
GFR calc Af Amer: 55 mL/min — ABNORMAL LOW (ref >60)
GFR calc Af Amer: 58 mL/min — ABNORMAL LOW (ref >60)
GFR calc Af Amer: >60 mL/min (ref >60)
GFR calc non Af Amer: 48 mL/min — ABNORMAL LOW (ref >60)
Glucose, Bld: 77 mg/dL (ref 70–99)
Glucose, Bld: 88 mg/dL (ref 70–99)
Glucose, Bld: 88 mg/dL (ref 70–99)
Glucose, Bld: 91 mg/dL (ref 70–99)
Potassium: 3.2 mEq/L — ABNORMAL LOW (ref 3.5–5.1)
Potassium: 3.2 mEq/L — ABNORMAL LOW (ref 3.5–5.1)
Potassium: 3.3 mEq/L — ABNORMAL LOW (ref 3.5–5.1)
Potassium: 3.5 mEq/L (ref 3.5–5.1)
Sodium: 137 mEq/L (ref 135–145)
Sodium: 138 mEq/L (ref 135–145)

## 2011-08-28 LAB — COMPREHENSIVE METABOLIC PANEL
ALT: 19 U/L (ref 0–35)
AST: 39 U/L — ABNORMAL HIGH (ref 0–37)
Albumin: 3.3 g/dL — ABNORMAL LOW (ref 3.5–5.2)
Albumin: 3.4 g/dL — ABNORMAL LOW (ref 3.5–5.2)
Alkaline Phosphatase: 69 U/L (ref 39–117)
BUN: 12 mg/dL (ref 6–23)
Calcium: 8.7 mg/dL (ref 8.4–10.5)
Chloride: 102 mEq/L (ref 96–112)
Creatinine, Ser: 0.73 mg/dL (ref 0.4–1.2)
GFR calc Af Amer: >60 mL/min (ref >60)
GFR calc non Af Amer: >60 mL/min (ref >60)
Glucose, Bld: 113 mg/dL — ABNORMAL HIGH (ref 70–99)
Potassium: 3.9 mEq/L (ref 3.5–5.1)
Sodium: 135 mEq/L (ref 135–145)
Total Bilirubin: 1.3 mg/dL — ABNORMAL HIGH (ref 0.3–1.2)
Total Protein: 5.5 g/dL — ABNORMAL LOW (ref 6.0–8.3)

## 2011-08-28 LAB — URINALYSIS, ROUTINE W REFLEX MICROSCOPIC
Glucose, UA: NEGATIVE mg/dL
Nitrite: NEGATIVE
Protein, ur: NEGATIVE mg/dL

## 2011-08-28 LAB — CULTURE, BLOOD (ROUTINE X 2)
Report Status: 12292009
Report Status: 12292009

## 2011-08-28 LAB — TSH: TSH: 0.816 u[IU]/mL (ref 0.350–4.500)

## 2011-08-28 LAB — STOOL CULTURE

## 2011-08-28 LAB — STREP A DNA PROBE: Group A Strep Probe: NEGATIVE

## 2011-08-28 LAB — CLOSTRIDIUM DIFFICILE EIA: C difficile Toxins A+B, EIA: NEGATIVE

## 2011-08-28 LAB — RAPID STREP SCREEN (MED CTR MEBANE ONLY): Streptococcus, Group A Screen (Direct): NEGATIVE

## 2011-09-12 ENCOUNTER — Encounter: Payer: Self-pay | Admitting: Cardiology

## 2011-09-22 ENCOUNTER — Ambulatory Visit (INDEPENDENT_AMBULATORY_CARE_PROVIDER_SITE_OTHER): Payer: Medicare Other | Admitting: Cardiology

## 2011-09-22 ENCOUNTER — Encounter: Payer: Self-pay | Admitting: Cardiology

## 2011-09-22 ENCOUNTER — Ambulatory Visit (INDEPENDENT_AMBULATORY_CARE_PROVIDER_SITE_OTHER): Payer: Medicare Other | Admitting: *Deleted

## 2011-09-22 DIAGNOSIS — Z7901 Long term (current) use of anticoagulants: Secondary | ICD-10-CM

## 2011-09-22 DIAGNOSIS — H9190 Unspecified hearing loss, unspecified ear: Secondary | ICD-10-CM

## 2011-09-22 DIAGNOSIS — I4891 Unspecified atrial fibrillation: Secondary | ICD-10-CM

## 2011-09-22 DIAGNOSIS — I429 Cardiomyopathy, unspecified: Secondary | ICD-10-CM | POA: Insufficient documentation

## 2011-09-22 DIAGNOSIS — H353190 Nonexudative age-related macular degeneration, unspecified eye, stage unspecified: Secondary | ICD-10-CM

## 2011-09-22 DIAGNOSIS — M1990 Unspecified osteoarthritis, unspecified site: Secondary | ICD-10-CM

## 2011-09-22 DIAGNOSIS — C50919 Malignant neoplasm of unspecified site of unspecified female breast: Secondary | ICD-10-CM

## 2011-09-22 DIAGNOSIS — N181 Chronic kidney disease, stage 1: Secondary | ICD-10-CM

## 2011-09-22 DIAGNOSIS — I10 Essential (primary) hypertension: Secondary | ICD-10-CM

## 2011-09-22 DIAGNOSIS — I495 Sick sinus syndrome: Secondary | ICD-10-CM

## 2011-09-22 DIAGNOSIS — I428 Other cardiomyopathies: Secondary | ICD-10-CM

## 2011-09-22 DIAGNOSIS — Z8679 Personal history of other diseases of the circulatory system: Secondary | ICD-10-CM

## 2011-09-22 LAB — POCT INR: INR: 3.1

## 2011-09-22 MED ORDER — TRAMADOL-ACETAMINOPHEN 37.5-325 MG PO TABS: ORAL_TABLET | ORAL | Status: DC

## 2011-09-22 NOTE — Assessment & Plan Note (Signed)
Patient remains free of apparent recurrence and is followed closely by Dr. Mariel Sleet.

## 2011-09-22 NOTE — Progress Notes (Signed)
Patient given DTAP injection, per order in Left deltoid.  Lot# [id number] Exp. 12/12/2013

## 2011-09-22 NOTE — Assessment & Plan Note (Addendum)
She has no active congestive heart failure, but does note intermittent pedal edema and weight gain.  Rather than increase her daily dose of furosemide, we will consider current weight as dry weight and increase her daily dose of furosemide to 40 mg per day whenever her weight increases 4 pounds, continuing at that dosage until her weight returns to baseline.

## 2011-09-22 NOTE — Assessment & Plan Note (Signed)
Patient has not checked his stool for Hemoccult testing over the past 12 months-specimens have been requested.  Her last CBC 5 months ago showed a minimal anemia-this will be repeated.  Screening colonoscopy was recommended-she will contact Dr. Karilyn Cota to perform this study.

## 2011-09-22 NOTE — Patient Instructions (Signed)
Your physician has recommended you make the following change in your medication:   1 - Ultracet 1-2 tablets three times a day as needed 2 - Adjust lasix to maintain todays weight  You have been referred to Dr Karilyn Cota for for colonoscopy  You have been referred to Physical Therapy for Right shoulder DJD  Your physician recommends that you return for lab work in: Today (CMET, CBC, Mag(  Your physician recommends that you schedule a follow-up appointment in: 6 months with Dr Dietrich Pates  Stool cards and return as soon as possible  Tetanus injection today in office

## 2011-09-22 NOTE — Assessment & Plan Note (Addendum)
Ultracet 1-2 tabs 3 times a day as needed prescribed for days when she is unable to tolerate Celebrex.  She will discuss with Dr. Karilyn Cota whether he can suggest additional evaluation or treatment that will help her to tolerate nonsteroidals.

## 2011-09-22 NOTE — Progress Notes (Signed)
HPI : Alicia Harrington returns to the office as scheduled for continued assessment and treatment of long-standing atrial fibrillation, hypertension and for general medical care.  Since her last visit, she has done quite well.  She is exercising on a regular basis at a local gym.  She has had chronic pain in the right shoulder attributed to arthritis for which she previously received steroid injections.  She and her husband have decided to treat this on a medical basis, and she is using Celebrex with fairly good pain relief, but episodic GI discomfort precluding daily use.  She has not had a colonoscopy for many years.  She has received influenza vaccine, pneumococcal vaccine and herpes zoster vaccine, but not tetanus within the past decade.  Mr. and Mrs. Alicia Harrington appreciated a recent consultation with Dr. Johney Frame, and concur with his recommendation not to proceed with radiofrequency ablation of atrial fibrillation.  Current Outpatient Prescriptions on File Prior to Visit  Medication Sig Dispense Refill  . digoxin (LANOXIN) 0.25 MG tablet Take 250 mcg by mouth daily. 1 tab MWF, 1/2 tab all other days       . DIOVAN 80 MG tablet TAKE ONE TABLET DAILY.  90 each  1  . furosemide (LASIX) 20 MG tablet Take 20 mg by mouth as needed. Take as needed 20 to 40 mg       . metoprolol succinate (TOPROL-XL) 25 MG 24 hr tablet Take 25 mg by mouth daily.        . Omega-3 Fatty Acids (FISH OIL) 1000 MG CAPS Take 1,000 mg by mouth daily.        . potassium chloride SA (KLOR-CON M20) 20 MEQ tablet Take 20 mEq by mouth as needed.        . tamoxifen (NOLVADEX) 20 MG tablet Take 20 mg by mouth daily.        Marland Kitchen warfarin (COUMADIN) 5 MG tablet Take by mouth as directed.           Allergies  Allergen Reactions  . Altace (Ramipril)       Past medical history, social history, and family history reviewed and updated.  ROS: Denies orthopnea, PND, lightheadedness, palpitations or syncope.  She does note mild intermittent pedal  edema and fluid retention with weight fluctuation of 3 or 4 pounds intermittently.  PHYSICAL EXAM: BP 139/70  Pulse 44  Ht 5\' 1"  (1.549 m)  Wt 120 lb (54.432 kg)  BMI 22.67 kg/m2  SpO2 96%  General-Well developed; no acute distress Body habitus-proportionate weight and height Neck-No JVD; no carotid bruits Lungs-clear lung fields; resonant to percussion Cardiovascular-normal PMI; normal S1 and S2; modest basilar systolic ejection murmur Abdomen-normal bowel sounds; soft and non-tender without masses or organomegaly Musculoskeletal-No deformities, no cyanosis or clubbing Neurologic-Normal cranial nerves; symmetric strength and tone Skin-Warm, no significant lesions Extremities-distal pulses intact; 1/2+ edema  Rhythm Strip: Atrial fibrillation; ventricular rate of 74 bpm; PVCs vs. aberrancy  ASSESSMENT AND PLAN:

## 2011-09-22 NOTE — Assessment & Plan Note (Signed)
Control of heart rate in atrial fibrillation remains excellent.  Current therapy will be continued.

## 2011-09-22 NOTE — Assessment & Plan Note (Signed)
Creatinine most recently 0.84 in 03/2011; renal function is normal for age.

## 2011-09-22 NOTE — Assessment & Plan Note (Signed)
Blood pressure control is adequate.  Tetanus toxoid injection has been ordered.

## 2011-09-23 LAB — COMPREHENSIVE METABOLIC PANEL
Albumin: 4.1 g/dL (ref 3.5–5.2)
Alkaline Phosphatase: 66 U/L (ref 39–117)
BUN: 18 mg/dL (ref 6–23)
Glucose, Bld: 131 mg/dL — ABNORMAL HIGH (ref 70–99)
Potassium: 3.9 mEq/L (ref 3.5–5.3)

## 2011-09-23 LAB — CBC
HCT: 40.8 % (ref 36.0–46.0)
Hemoglobin: 13.4 g/dL (ref 12.0–15.0)
MCH: 34.6 pg — ABNORMAL HIGH (ref 26.0–34.0)
MCHC: 32.8 g/dL (ref 30.0–36.0)

## 2011-09-24 ENCOUNTER — Encounter: Payer: Self-pay | Admitting: Cardiology

## 2011-09-24 ENCOUNTER — Ambulatory Visit (HOSPITAL_COMMUNITY)
Admission: RE | Admit: 2011-09-24 | Discharge: 2011-09-24 | Disposition: A | Discharge status: Home / Self Care | Payer: Medicare Other | Source: Ambulatory Visit | Attending: Cardiology | Admitting: Cardiology

## 2011-09-24 DIAGNOSIS — I10 Essential (primary) hypertension: Secondary | ICD-10-CM | POA: Insufficient documentation

## 2011-09-24 DIAGNOSIS — IMO0001 Reserved for inherently not codable concepts without codable children: Secondary | ICD-10-CM | POA: Insufficient documentation

## 2011-09-24 DIAGNOSIS — M25519 Pain in unspecified shoulder: Secondary | ICD-10-CM | POA: Insufficient documentation

## 2011-09-24 DIAGNOSIS — M779 Enthesopathy, unspecified: Secondary | ICD-10-CM | POA: Insufficient documentation

## 2011-09-24 DIAGNOSIS — M6281 Muscle weakness (generalized): Secondary | ICD-10-CM | POA: Insufficient documentation

## 2011-09-24 DIAGNOSIS — M25619 Stiffness of unspecified shoulder, not elsewhere classified: Secondary | ICD-10-CM | POA: Insufficient documentation

## 2011-09-24 NOTE — Progress Notes (Signed)
Occupational Therapy Evaluation  Patient Details  Name: Alicia Harrington MRN: [id number] Date of Birth: 1932-04-02  Today's Date: 09/24/2011 Time: 0930-1010 Time Calculation (min): 40 min OT Evaluation [phone] 17' Manual Therapy [phone] 22' Visit#: 1  of 16   Re-eval: 10/22/11     Past Medical History:  Past Medical History  Diagnosis Date  . Cardiomyopathy 2009    possibly secondary to Adriamycin; EF 40% in 12/09 and 50% 3/10; pulmonary edema in 2009  . Sick sinus syndrome     Paroxysmal to permanent AF; initially first degree AV block also noted  . Chronic kidney disease (CKD), stage I     when on diuretic with creatinine of 1.6  . Hypertension   . Adenocarcinoma, breast     treated with lumpectomy, node dissection, chemo and RT  . Hearing impairment   . DJD (degenerative joint disease)     right knee and lumbosacral spine  . History of angina 2001    normal cornary arteries in 2001  . Macular degeneration, dry     + cataracts  . Chronic anticoagulation 04/16/2011   Past Surgical History:  Past Surgical History  Procedure Date  . Appendectomy   . Dilation and curettage of uterus   . Middle ear surgery     Left  . Tonsillectomy   . Microdiscectomy lumbar 2009    lumbosacral spine  . Mastectomy partial / lumpectomy w/ axillary lymphadenectomy 2009    Carcinoma of the breast    Subjective Symptoms/Limitations Symptoms: S:  I have had chronic problems with my right shoulder.  It has gotten worse recently, and I finally decided to do something about it. Limitations: Pertinent History:  Mrs. Peterkin tore ligaments in her right shoulder years ago while participating in an exercise class.  She has had multiple injections in her shoulder, but continues to have difficulty and increased pain when attempting to use her right shoulder.  SHe has been referred to occupational therapy for evaluation and treatment. Pain Assessment Currently in Pain?: Yes Pain Score:    7 Pain Location: Shoulder Pain Orientation: Right Pain Type: Acute pain   Assessment    09/24/11 1100  Assessment  Diagnosis Right Shoulder Pain and Bone Spurs  Next MD Visit Unknown  Prior Therapy no  RUE Assessment  RUE Assessment (assessed in seated, ER and IR assessed with shoulder abducte)  RUE AROM (degrees)  Right Shoulder Flexion  0-170 110 Degrees  Right Shoulder ABduction 0-140 120 Degrees  Right Shoulder Internal Rotation  0-70 85 Degrees  Right Shoulder External Rotation  0-90 30 Degrees  RUE Strength  Right Shoulder Flexion (4-/5)  Right Shoulder ABduction (4-/5)  Right Shoulder Internal Rotation (4-/5)  Right Shoulder External Rotation (4-/5)    Exercise/Treatments Supine Protraction: PROM;10 reps Horizontal ABduction: PROM;10 reps External Rotation: PROM;10 reps Internal Rotation: PROM;10 reps Flexion: PROM;10 reps ABduction: PROM;10 reps     Manual Therapy Manual Therapy: Myofascial release Myofascial Release: MFR and manual stretching to right shoulder, scapular, trapezius, and SCM to decrease pain and restrictions and increase pain free mobility. [phone]  Occupational Therapy Assessment and Plan OT Assessment and Plan Clinical Impression Statement: A:  Mrs. Hoch presents with decreased AROM and strength and increased pain and fascial restrictions in her right shoulder, causing decreased I with all B/IADLs and leisure activities. Rehab Potential: Excellent OT Frequency: Min 2X/week OT Duration: 8 weeks OT Treatment/Interventions: Self-care/ADL training;Therapeutic exercise;Manual therapy;Therapeutic activities;Patient/family education;Other (comment) (modalities PRN, HEP:  shld and cervical stretches)  OT Plan: P:  Skilled OT intervention to decrease pain and restrictions and increase AROM and strength in her right shoulder region.  Treatment Plan:  MFR and manual stretching as outlined above.  Ther Ex:  supine PROM and dowel, progress to AROM as  tolerated.  Seated ball stretches, ext, ret, row, wall wash, pulleys.  Progress as tolerated.   Goals Short Term Goals Time to Complete Short Term Goals: 4 weeks Short Term Goal 1: Patient will be independent with a HEP. Short Term Goal 2: Patient will increase AROM by 20 for increased ability to reach overhead and hook her bra. Short Term Goal 3: Patient will increase right shoulder strength to 4/5 for increased ability to use weight machines at the [facility]. Short Term Goal 4: Patient will decrease pain to 4/10 in her right shoulder during functional activities. Short Term Goal 5: Patient will decrease fascial restrictions from max to moderate in her right shoulder region. Long Term Goals Time to Complete Long Term Goals: 8 weeks Long Term Goal 1: Patient will return to prior level of I with all B/IADLs and leisure activities. Long Term Goal 2: Patient will increase AROM to WNL for increased ability to reach overhead and fasten her bra. Long Term Goal 3: Patient will increase right shoulder strength to 4+/5 for increased ability to use weight machines at the [facility]. Long Term Goal 4: Patient will decrease pain level to 2/10 in her right shoulder region. Long Term Goal 5: Patient will decrease fascial restrictions to minimal in her right shoulder region. End of Session Patient Active Problem List  Diagnoses  . HEARING IMPAIRMENT  . HYPERTENSION  . Atrial fibrillation  . Chronic anticoagulation  . Cardiomyopathy  . Sick sinus syndrome  . Chronic kidney disease (CKD), stage I  . Adenocarcinoma, breast  . DJD (degenerative joint disease)  . History of angina  . Macular degeneration, dry  . Pain in joint, shoulder region  . Muscle weakness (generalized)  . Bone spur   End of Session Activity Tolerance: Patient tolerated treatment well General Behavior During Session: [facility] for tasks performed Cognition: [facility] for tasks performed  Time Calculation Start Time: 0930 Stop Time: 1010 Time  Calculation (min): 40 min  Shirlean Mylar, OTR/L  09/24/2011, 3:15 PM

## 2011-09-26 ENCOUNTER — Ambulatory Visit (HOSPITAL_COMMUNITY)
Admission: RE | Admit: 2011-09-26 | Discharge: 2011-09-26 | Disposition: A | Discharge status: Home / Self Care | Payer: Medicare Other | Source: Ambulatory Visit | Attending: Cardiology | Admitting: Cardiology

## 2011-09-26 DIAGNOSIS — IMO0001 Reserved for inherently not codable concepts without codable children: Secondary | ICD-10-CM | POA: Insufficient documentation

## 2011-09-26 DIAGNOSIS — M6281 Muscle weakness (generalized): Secondary | ICD-10-CM | POA: Insufficient documentation

## 2011-09-26 DIAGNOSIS — M25519 Pain in unspecified shoulder: Secondary | ICD-10-CM | POA: Insufficient documentation

## 2011-09-26 DIAGNOSIS — M25619 Stiffness of unspecified shoulder, not elsewhere classified: Secondary | ICD-10-CM | POA: Insufficient documentation

## 2011-09-26 DIAGNOSIS — I10 Essential (primary) hypertension: Secondary | ICD-10-CM | POA: Insufficient documentation

## 2011-09-26 NOTE — Progress Notes (Signed)
Occupational Therapy Treatment  Patient Details  Name: Alicia Harrington MRN: [id number] Date of Birth: 1932/07/13  Today's Date: 09/26/2011 Time: 6962-9528 Time Calculation (min): 54 min Visit#: 2  of 16   Re-eval: 10/22/11 [facility]  [phone]  60' Therapeutic Exercise  [phone]  27'  Subjective Symptoms/Limitations Symptoms: S:  I felt really good and slept really good.. Pain Assessment Currently in Pain?: No/denies   Exercise/Treatments Supine Protraction: PROM;AAROM;10 reps Horizontal ABduction: PROM;AAROM;10 reps External Rotation: PROM;AAROM;10 reps Internal Rotation: PROM;AAROM;10 reps Flexion: PROM;AAROM;10 reps ABduction: PROM;AAROM;10 reps Seated Elevation: AROM;10 reps Extension: AROM;10 reps Retraction: AROM;10 reps Row: AROM;10 reps Pulleys Flexion: 2 minutes ABduction: 2 minutes Therapy Ball Flexion: 20 reps ABduction: 20 reps ROM / Strengthening / Isometric Strengthening Wall Wash: 2'  Manual Therapy Manual Therapy: Myofascial release Myofascial Release: MFR and manual stretching to right shoulder, scapular, trapezius, and SCM to decrease pain and restrictions and increase pain free mobility  Occupational Therapy Assessment and Plan OT Assessment and Plan Clinical Impression Statement: A:  Added multiple new exercises.  Wall wash and hzn abd with dowel most difficult. Rehab Potential: Excellent OT Plan: P:  Increase reps with dowel.   Goals Short Term Goals Time to Complete Short Term Goals: 4 weeks Short Term Goal 1: Patient will be independent with a HEP. Short Term Goal 2: Patient will increase AROM by 20 for increased ability to reach overhead and hook her bra. Short Term Goal 3: Patient will increase right shoulder strength to 4/5 for increased ability to use weight machines at the [facility]. Short Term Goal 4: Patient will decrease pain to 4/10 in her right shoulder during functional activities. Short Term Goal 5: Patient will decrease  fascial restrictions from max to moderate in her right shoulder region. Long Term Goals Time to Complete Long Term Goals: 8 weeks Long Term Goal 1: Patient will return to prior level of I with all B/IADLs and leisure activities. Long Term Goal 2: Patient will increase AROM to WNL for increased ability to reach overhead and fasten her bra. Long Term Goal 3: Patient will increase right shoulder strength to 4+/5 for increased ability to use weight machines at the [facility]. Long Term Goal 4: Patient will decrease pain level to 2/10 in her right shoulder region. Long Term Goal 5: Patient will decrease fascial restrictions to minimal in her right shoulder region. End of Session Patient Active Problem List  Diagnoses  . HEARING IMPAIRMENT  . HYPERTENSION  . Atrial fibrillation  . Chronic anticoagulation  . Cardiomyopathy  . Sick sinus syndrome  . Chronic kidney disease (CKD), stage I  . Adenocarcinoma, breast  . DJD (degenerative joint disease)  . History of angina  . Macular degeneration, dry  . Pain in joint, shoulder region  . Muscle weakness (generalized)  . Bone spur   End of Session Activity Tolerance: Patient tolerated treatment well General Behavior During Session: [facility] for tasks performed Cognition: [facility] for tasks performed    Akai Dollard L. Tanai Bouler, COTA/L  09/26/2011, 10:09 AM

## 2011-09-29 ENCOUNTER — Telehealth: Payer: Self-pay | Admitting: Cardiology

## 2011-09-29 ENCOUNTER — Encounter (INDEPENDENT_AMBULATORY_CARE_PROVIDER_SITE_OTHER): Payer: Self-pay | Admitting: *Deleted

## 2011-09-29 ENCOUNTER — Other Ambulatory Visit (INDEPENDENT_AMBULATORY_CARE_PROVIDER_SITE_OTHER): Payer: Self-pay | Admitting: *Deleted

## 2011-09-29 DIAGNOSIS — Z1211 Encounter for screening for malignant neoplasm of colon: Secondary | ICD-10-CM

## 2011-09-29 DIAGNOSIS — Z853 Personal history of malignant neoplasm of breast: Secondary | ICD-10-CM

## 2011-09-29 NOTE — Telephone Encounter (Signed)
Patient is scheduled for Colonoscopy 11/14/11.  Needs okay to be off of Coumadin 5 days prior to test.  Please call Ann @ above number. / tg

## 2011-09-30 ENCOUNTER — Telehealth: Payer: Self-pay | Admitting: *Deleted

## 2011-10-01 ENCOUNTER — Other Ambulatory Visit: Payer: Self-pay

## 2011-10-01 ENCOUNTER — Encounter (INDEPENDENT_AMBULATORY_CARE_PROVIDER_SITE_OTHER): Payer: Medicare Other

## 2011-10-01 ENCOUNTER — Ambulatory Visit (HOSPITAL_COMMUNITY)
Admission: RE | Admit: 2011-10-01 | Discharge: 2011-10-01 | Disposition: A | Discharge status: Home / Self Care | Payer: Medicare Other | Source: Ambulatory Visit | Attending: Cardiology | Admitting: Cardiology

## 2011-10-01 DIAGNOSIS — Z7901 Long term (current) use of anticoagulants: Secondary | ICD-10-CM

## 2011-10-01 NOTE — Progress Notes (Signed)
Occupational Therapy Treatment  Patient Details  Name: Alicia Harrington MRN: [id number] Date of Birth: Jun 16, 1932  Today's Date: 10/01/2011 Time: 6295-2841 Time Calculation (min): 44 min Visit#: 3  of 16   Re-eval: 10/22/11 [facility]  [phone]  Therapeutic Exercise 1010-1025  15'    Subjective Symptoms/Limitations Symptoms: S:  It feels good this morning, not great but better. Pain Assessment Currently in Pain?: Yes Pain Score:   4 Pain Location: Shoulder Pain Orientation: Right Pain Type: Acute pain  Exercise/Treatments Supine Protraction: PROM;AAROM;12 reps Horizontal ABduction:  (hold today, causing increased pain.) External Rotation: PROM;AAROM;12 reps Internal Rotation: PROM;AAROM;12 reps Flexion: PROM;AAROM;12 reps ABduction: PROM;AAROM;12 reps Seated Elevation: AROM;15 reps Extension: AROM;15 reps Retraction: AROM;15 reps Row: AROM;15 reps Pulleys Flexion: 2 minutes ABduction: 2 minutes Therapy Ball Flexion: 20 reps ABduction: 20 reps ROM / Strengthening / Isometric Strengthening Wall Wash: 3'   Manual Therapy Manual Therapy: Myofascial release Myofascial Release: MFR and manual stretching to right shoulder, scapular, trapezius, and SCM to decrease pain and restrictions and increase pain free mobility.  Occupational Therapy Assessment and Plan OT Assessment and Plan Clinical Impression Statement: A:  Increase time with wall wash.  Patient unable to complete dowel hzn abd secondary to pain. Rehab Potential: Excellent OT Plan: P:  Add thumbtacks and attempt hzn abd again.   Goals Short Term Goals Time to Complete Short Term Goals: 4 weeks Short Term Goal 1: Patient will be independent with a HEP. Short Term Goal 2: Patient will increase AROM by 20 for increased ability to reach overhead and hook her bra. Short Term Goal 3: Patient will increase right shoulder strength to 4/5 for increased ability to use weight machines at the [facility]. Short Term  Goal 4: Patient will decrease pain to 4/10 in her right shoulder during functional activities. Short Term Goal 5: Patient will decrease fascial restrictions from max to moderate in her right shoulder region. Long Term Goals Time to Complete Long Term Goals: 8 weeks Long Term Goal 1: Patient will return to prior level of I with all B/IADLs and leisure activities. Long Term Goal 2: Patient will increase AROM to WNL for increased ability to reach overhead and fasten her bra. Long Term Goal 3: Patient will increase right shoulder strength to 4+/5 for increased ability to use weight machines at the [facility]. Long Term Goal 4: Patient will decrease pain level to 2/10 in her right shoulder region. Long Term Goal 5: Patient will decrease fascial restrictions to minimal in her right shoulder region. End of Session Patient Active Problem List  Diagnoses  . HEARING IMPAIRMENT  . HYPERTENSION  . Atrial fibrillation  . Chronic anticoagulation  . Cardiomyopathy  . Sick sinus syndrome  . Chronic kidney disease (CKD), stage I  . Adenocarcinoma, breast  . DJD (degenerative joint disease)  . History of angina  . Macular degeneration, dry  . Pain in joint, shoulder region  . Muscle weakness (generalized)  . Bone spur   End of Session Activity Tolerance: Patient tolerated treatment well General Behavior During Session: [facility] for tasks performed Cognition: [facility] for tasks performed    Donis Pinder L. Emmerie Battaglia, COTA/L  10/01/2011, 12:00 PM

## 2011-10-03 ENCOUNTER — Ambulatory Visit (HOSPITAL_COMMUNITY)
Admission: RE | Admit: 2011-10-03 | Discharge: 2011-10-03 | Disposition: A | Discharge status: Home / Self Care | Payer: Medicare Other | Source: Ambulatory Visit | Attending: Cardiology | Admitting: Cardiology

## 2011-10-03 ENCOUNTER — Other Ambulatory Visit (INDEPENDENT_AMBULATORY_CARE_PROVIDER_SITE_OTHER): Payer: Medicare Other

## 2011-10-03 DIAGNOSIS — Z7901 Long term (current) use of anticoagulants: Secondary | ICD-10-CM

## 2011-10-03 DIAGNOSIS — I4891 Unspecified atrial fibrillation: Secondary | ICD-10-CM

## 2011-10-03 LAB — POCT INR: INR: NORMAL

## 2011-10-03 NOTE — Progress Notes (Signed)
Occupational Therapy Treatment  Patient Details  Name: Alicia Harrington MRN: [id number] Date of Birth: 11/30/1931  Today's Date: 10/03/2011 Time: 9147-8295 Time Calculation (min): 64 min Visit#: 4  of 16   Re-eval: 10/22/11 [facility] Therapy  [phone] 34' Therapeutic Exercise 1010-1040 30'     Subjective Symptoms/Limitations Symptoms: S:  It feels pretty good now, I was hurting last night. Pain Assessment Currently in Pain?: Yes Pain Score:   4 Pain Location: Shoulder Pain Orientation: Right Pain Type: Acute pain  Exercise/Treatments Supine Protraction: PROM;AAROM;15 reps External Rotation: PROM;AAROM;15 reps Internal Rotation: PROM;AAROM;15 reps Flexion: PROM;AAROM;15 reps ABduction: PROM;AAROM;15 reps Seated Elevation: AROM;15 reps Extension: AROM;15 reps Retraction: AROM;15 reps Row: AROM;15 reps PPulleys Flexion: 2 minutes ABduction:  (unable today secondary to pain with ABD.) Therapy Ball Flexion: 20 reps ABduction: 20 reps ROM / Strengthening / Isometric Strengthening Wall Wash: 4' Thumb Tacks: 1' Prot/Ret//Elev/Dep: 1'    Manual Therapy Manual Therapy: Myofascial release Myofascial Release: MFR and manual stretching to right shoulder, scapular, trapezius, and SCM to decrease pain and restrictions and increase pain free mobility.  Occupational Therapy Assessment and Plan OT Assessment and Plan Clinical Impression Statement: A;  Continues to have increased pain with hzn abduction in supine and abduction with pullie. OT Plan: P:  Continue to increase painfree AROM.   Goals Short Term Goals Time to Complete Short Term Goals: 4 weeks Short Term Goal 1: Patient will be independent with a HEP. Short Term Goal 2: Patient will increase AROM by 20 for increased ability to reach overhead and hook her bra. Short Term Goal 3: Patient will increase right shoulder strength to 4/5 for increased ability to use weight machines at the [facility]. Short Term Goal 4: Patient  will decrease pain to 4/10 in her right shoulder during functional activities. Short Term Goal 5: Patient will decrease fascial restrictions from max to moderate in her right shoulder region. Long Term Goals Time to Complete Long Term Goals: 8 weeks Long Term Goal 1: Patient will return to prior level of I with all B/IADLs and leisure activities. Long Term Goal 2: Patient will increase AROM to WNL for increased ability to reach overhead and fasten her bra. Long Term Goal 3: Patient will increase right shoulder strength to 4+/5 for increased ability to use weight machines at the [facility]. Long Term Goal 4: Patient will decrease pain level to 2/10 in her right shoulder region. Long Term Goal 5: Patient will decrease fascial restrictions to minimal in her right shoulder region. End of Session Patient Active Problem List  Diagnoses  . HEARING IMPAIRMENT  . HYPERTENSION  . Atrial fibrillation  . Chronic anticoagulation  . Cardiomyopathy  . Sick sinus syndrome  . Chronic kidney disease (CKD), stage I  . Adenocarcinoma, breast  . DJD (degenerative joint disease)  . History of angina  . Macular degeneration, dry  . Pain in joint, shoulder region  . Muscle weakness (generalized)  . Bone spur   End of Session Activity Tolerance: Patient limited by pain General Behavior During Session: [facility] for tasks performed Cognition: [facility] for tasks performed   Myangel Summons L. Latravia Southgate, COTA/L  10/03/2011, 12:09 PM

## 2011-10-07 ENCOUNTER — Ambulatory Visit (HOSPITAL_COMMUNITY)
Admission: RE | Admit: 2011-10-07 | Discharge: 2011-10-07 | Disposition: A | Discharge status: Home / Self Care | Payer: Medicare Other | Source: Ambulatory Visit | Attending: Cardiology | Admitting: Cardiology

## 2011-10-07 NOTE — Progress Notes (Signed)
Occupational Therapy Treatment  Patient Details  Name: Alicia Harrington MRN: [id number] Date of Birth: Sep 22, 1932  Today's Date: 10/07/2011 Time: 4098-1191 Time Calculation (min): 46 min Manual Therapy [phone] 20' Therapeutic Exercises [phone] 25' Visit#: 5  of 16   Re-eval: 10/22/11    Subjective Symptoms/Limitations Symptoms: S:  I think its getting better, its just taking time. Pain Assessment Currently in Pain?: Yes Pain Location: Shoulder Pain Orientation: Right Pain Type: Acute pain  Exercise/Treatments  10/07/11 0700 Shoulder Exercises: Supine Protraction PROM;AAROM;15 reps Horizontal ABduction PROM;AAROM;15 reps External Rotation PROM;AAROM;15 reps Internal Rotation PROM;AAROM;15 reps Flexion PROM;AAROM;15 reps ABduction PROM;AAROM;15 reps Shoulder Exercises: Seated Elevation AROM;15 reps Extension AROM;15 reps Retraction AROM;15 reps Row AROM;15 reps Shoulder Exercises: Pulleys Flexion 2 minutes ABduction Other (comment) (unable secondary to pain) Shoulder Exercises: Therapy Ball Flexion 20 reps ABduction 20 reps Shoulder Exercises: ROM/Strengthening Wall Wash 4' Thumb Tacks 1' Prot/Ret//Elev/Dep 1'    Manual Therapy Manual Therapy: Myofascial release Myofascial Release: MFR and manual stretching to right shoulder, scapular, trapezius, and SCM to decrease pain and restrictions, and increase pain free mobility.  [phone]  Occupational Therapy Assessment and Plan OT Assessment and Plan Clinical Impression Statement: A:  Pain with P/AROM above 90 flexion and abduction. OT Plan: P:  Attempt AROM within pain limits in supine.    Goals Short Term Goals Time to Complete Short Term Goals: 4 weeks Short Term Goal 1: Patient will be independent with a HEP. Short Term Goal 1 Progress: Progressing toward goal Short Term Goal 2: Patient will increase AROM by 20 for increased ability to reach overhead and hook her bra. Short Term Goal 2 Progress:  Progressing toward goal Short Term Goal 3: Patient will increase right shoulder strength to 4/5 for increased ability to use weight machines at the [facility]. Short Term Goal 3 Progress: Progressing toward goal Short Term Goal 4: Patient will decrease pain to 4/10 in her right shoulder during functional activities. Short Term Goal 4 Progress: Progressing toward goal Short Term Goal 5: Patient will decrease fascial restrictions from max to moderate in her right shoulder region. Short Term Goal 5 Progress: Progressing toward goal Long Term Goals Time to Complete Long Term Goals: 8 weeks Long Term Goal 1: Patient will return to prior level of I with all B/IADLs and leisure activities. Long Term Goal 1 Progress: Progressing toward goal Long Term Goal 2: Patient will increase AROM to WNL for increased ability to reach overhead and fasten her bra. Long Term Goal 2 Progress: Progressing toward goal Long Term Goal 3: Patient will increase right shoulder strength to 4+/5 for increased ability to use weight machines at the [facility]. Long Term Goal 3 Progress: Progressing toward goal Long Term Goal 4: Patient will decrease pain level to 2/10 in her right shoulder region. Long Term Goal 4 Progress: Progressing toward goal Long Term Goal 5: Patient will decrease fascial restrictions to minimal in her right shoulder region. Long Term Goal 5 Progress: Progressing toward goal End of Session Patient Active Problem List  Diagnoses  . HEARING IMPAIRMENT  . HYPERTENSION  . Atrial fibrillation  . Chronic anticoagulation  . Cardiomyopathy  . Sick sinus syndrome  . Chronic kidney disease (CKD), stage I  . Adenocarcinoma, breast  . DJD (degenerative joint disease)  . History of angina  . Macular degeneration, dry  . Pain in joint, shoulder region  . Muscle weakness (generalized)  . Bone spur   End of Session Activity Tolerance: Patient tolerated treatment well General Behavior  During Session: [facility] for tasks  performed Cognition: [facility] for tasks performed   Shirlean Mylar, OTR/L  10/07/2011, 11:02 AM

## 2011-10-10 ENCOUNTER — Ambulatory Visit (HOSPITAL_COMMUNITY)
Admission: RE | Admit: 2011-10-10 | Discharge: 2011-10-10 | Disposition: A | Discharge status: Home / Self Care | Payer: Medicare Other | Source: Ambulatory Visit | Attending: Cardiology | Admitting: Cardiology

## 2011-10-10 NOTE — Progress Notes (Signed)
Occupational Therapy Treatment  Patient Details  Name: Alicia Harrington MRN: [id number] Date of Birth: 31-Jul-1932  Today's Date: 10/10/2011 Time: 4098-1191 Time Calculation (min): 54 min Manual Therapy [phone] 20' Therapeutic Exercises [phone] 58' Visit#: 6  of 16   Re-eval: 10/22/11    Subjective Symptoms/Limitations Symptoms: S:  I think we are making progress. Pain Assessment Currently in Pain?: Yes Pain Score:   4 Pain Orientation: Right Pain Type: Acute pain  Exercise/Treatments Supine Protraction: PROM;AROM;10 reps;AAROM;15 reps Horizontal ABduction: PROM;10 reps External Rotation: PROM;AROM;10 reps;AAROM;15 reps Internal Rotation: PROM;AROM;10 reps;AAROM;15 reps Flexion: PROM;AROM;10 reps;AAROM;15 reps ABduction: PROM;AROM;10 reps;AAROM;15 reps Seated Elevation: AROM;15 reps Extension: AROM;15 reps Retraction: AROM;15 reps Row: AROM;15 reps Pulleys Flexion: 3 minutes Therapy Ball Flexion: 25 reps ABduction: 25 reps Right/Left: 5 reps ROM / Strengthening / Isometric Strengthening Wall Wash: 4' Thumb Tacks: 1' Prot/Ret//Elev/Dep: 1'    Manual Therapy Manual Therapy: Myofascial release Myofascial Release: MFR and manual stretching to right shoulder, scapular, trapezius, and SCM to decrease pain and restrictions, and increase pain free mobility.  [phone]  Occupational Therapy Assessment and Plan OT Assessment and Plan Clinical Impression Statement: A:  Added AROM with min facilitation in supine.  Horiz abd too painful to complete. OT Plan: P: Attempt UBE.   Goals Short Term Goals Time to Complete Short Term Goals: 4 weeks Short Term Goal 1: Patient will be independent with a HEP. Short Term Goal 2: Patient will increase AROM by 20 for increased ability to reach overhead and hook her bra. Short Term Goal 3: Patient will increase right shoulder strength to 4/5 for increased ability to use weight machines at the [facility]. Short Term Goal 4: Patient will  decrease pain to 4/10 in her right shoulder during functional activities. Short Term Goal 5: Patient will decrease fascial restrictions from max to moderate in her right shoulder region. Long Term Goals Time to Complete Long Term Goals: 8 weeks Long Term Goal 1: Patient will return to prior level of I with all B/IADLs and leisure activities. Long Term Goal 2: Patient will increase AROM to WNL for increased ability to reach overhead and fasten her bra. Long Term Goal 3: Patient will increase right shoulder strength to 4+/5 for increased ability to use weight machines at the [facility]. Long Term Goal 4: Patient will decrease pain level to 2/10 in her right shoulder region. Long Term Goal 5: Patient will decrease fascial restrictions to minimal in her right shoulder region. End of Session Patient Active Problem List  Diagnoses  . HEARING IMPAIRMENT  . HYPERTENSION  . Atrial fibrillation  . Chronic anticoagulation  . Cardiomyopathy  . Sick sinus syndrome  . Chronic kidney disease (CKD), stage I  . Adenocarcinoma, breast  . DJD (degenerative joint disease)  . History of angina  . Macular degeneration, dry  . Pain in joint, shoulder region  . Muscle weakness (generalized)  . Bone spur   End of Session Activity Tolerance: Patient tolerated treatment well General Behavior During Session: [facility] for tasks performed Cognition: [facility] for tasks performed   Shirlean Mylar, OTR/L  10/10/2011, 10:06 AM

## 2011-10-14 ENCOUNTER — Ambulatory Visit (HOSPITAL_COMMUNITY)
Admission: RE | Admit: 2011-10-14 | Discharge: 2011-10-14 | Disposition: A | Discharge status: Home / Self Care | Payer: Medicare Other | Source: Ambulatory Visit | Attending: Cardiology | Admitting: Cardiology

## 2011-10-14 NOTE — Progress Notes (Addendum)
Occupational Therapy Treatment  Patient Details  Name: Alicia Harrington MRN: [id number] Date of Birth: 1931-12-12  Today's Date: 10/14/2011 Time: 4098-1191 Time Calculation (min): 58 min Visit#: 7  of 16   Re-eval: 10/22/11 Manuel Therapy [phone] 24' Therapeutic Exercise  [phone]  33'    Subjective Symptoms/Limitations Symptoms: S:  I think we are improving, the most improvement is at night. Pain Assessment Currently in Pain?: Yes Pain Score:   4 Pain Location: Shoulder Pain Orientation: Right Pain Type: Acute pain   Exercise/Treatments Supine Protraction: PROM;10 reps;AROM;12 reps Horizontal ABduction: PROM;10 reps;AROM;12 reps External Rotation: PROM;10 reps;AROM;12 reps Internal Rotation: PROM;10 reps;AROM;12 reps Flexion: PROM;10 reps;AROM;12 reps ABduction: PROM;10 reps;AROM;12 reps Seated Elevation: AROM;15 reps Extension: AROM;15 reps Retraction: AROM;15 reps Row: AROM;15 reps Therapy Ball Flexion: 25 reps ABduction: 25 reps ROM / Strengthening / Isometric Strengthening Wall Wash: 4' Thumb Tacks: 1' Prot/Ret//Elev/Dep: 1'  Manual Therapy Manual Therapy: Myofascial release Myofascial Release: MFR and manual stretching to right shoulder, scapular, trapezius, and SCM to decrease pain and restrictions and increase pain free mobility  Occupational Therapy Assessment and Plan OT Assessment and Plan Clinical Impression Statement: A:  Assist needed with ABD with supine AROM.  Added UBE. d/c dowel. Rehab Potential: Excellent OT Plan: P:  Add AROM seated.   Goals Short Term Goals Time to Complete Short Term Goals: 4 weeks Short Term Goal 1: Patient will be independent with a HEP. Short Term Goal 2: Patient will increase AROM by 20 for increased ability to reach overhead and hook her bra. Short Term Goal 3: Patient will increase right shoulder strength to 4/5 for increased ability to use weight machines at the [facility]. Short Term Goal 4: Patient will  decrease pain to 4/10 in her right shoulder during functional activities. Short Term Goal 5: Patient will decrease fascial restrictions from max to moderate in her right shoulder region. Long Term Goals Time to Complete Long Term Goals: 8 weeks Long Term Goal 1: Patient will return to prior level of I with all B/IADLs and leisure activities. Long Term Goal 2: Patient will increase AROM to WNL for increased ability to reach overhead and fasten her bra. Long Term Goal 3: Patient will increase right shoulder strength to 4+/5 for increased ability to use weight machines at the [facility]. Long Term Goal 4: Patient will decrease pain level to 2/10 in her right shoulder region. Long Term Goal 5: Patient will decrease fascial restrictions to minimal in her right shoulder region. End of Session Patient Active Problem List  Diagnoses  . HEARING IMPAIRMENT  . HYPERTENSION  . Atrial fibrillation  . Chronic anticoagulation  . Cardiomyopathy  . Sick sinus syndrome  . Chronic kidney disease (CKD), stage I  . Adenocarcinoma, breast  . DJD (degenerative joint disease)  . History of angina  . Macular degeneration, dry  . Pain in joint, shoulder region  . Muscle weakness (generalized)  . Bone spur   End of Session Activity Tolerance: Patient tolerated treatment well General Behavior During Session: [facility] for tasks performed Cognition: [facility] for tasks performed   Autumn Pruitt L. Nyxon Strupp, COTA/L  10/14/2011, 10:55 AM

## 2011-10-20 ENCOUNTER — Encounter (HOSPITAL_COMMUNITY): Payer: Medicare Other | Attending: Oncology | Admitting: Oncology

## 2011-10-20 ENCOUNTER — Ambulatory Visit (INDEPENDENT_AMBULATORY_CARE_PROVIDER_SITE_OTHER): Payer: Medicare Other | Admitting: *Deleted

## 2011-10-20 VITALS — BP 135/79 | HR 75 | Temp 98.0°F | Wt 120.2 lb

## 2011-10-20 DIAGNOSIS — C50919 Malignant neoplasm of unspecified site of unspecified female breast: Secondary | ICD-10-CM | POA: Insufficient documentation

## 2011-10-20 DIAGNOSIS — I4891 Unspecified atrial fibrillation: Secondary | ICD-10-CM | POA: Insufficient documentation

## 2011-10-20 DIAGNOSIS — Z7901 Long term (current) use of anticoagulants: Secondary | ICD-10-CM

## 2011-10-20 DIAGNOSIS — C773 Secondary and unspecified malignant neoplasm of axilla and upper limb lymph nodes: Secondary | ICD-10-CM

## 2011-10-20 DIAGNOSIS — R5381 Other malaise: Secondary | ICD-10-CM | POA: Insufficient documentation

## 2011-10-20 DIAGNOSIS — R531 Weakness: Secondary | ICD-10-CM

## 2011-10-20 DIAGNOSIS — I509 Heart failure, unspecified: Secondary | ICD-10-CM

## 2011-10-20 DIAGNOSIS — F341 Dysthymic disorder: Secondary | ICD-10-CM | POA: Insufficient documentation

## 2011-10-20 DIAGNOSIS — F329 Major depressive disorder, single episode, unspecified: Secondary | ICD-10-CM

## 2011-10-20 DIAGNOSIS — Z79899 Other long term (current) drug therapy: Secondary | ICD-10-CM | POA: Insufficient documentation

## 2011-10-20 LAB — POCT INR: INR: 2.2

## 2011-10-20 NOTE — Patient Instructions (Signed)
ROMANDA TURRUBIATES  [id number] 20-Feb-1932   [facility]  Discharge Instructions  RECOMMENDATIONS MADE BY THE CONSULTANT AND ANY TEST RESULTS WILL BE SENT TO YOUR REFERRING DOCTOR.   EXAM FINDINGS BY MD TODAY AND SIGNS AND SYMPTOMS TO REPORT TO CLINIC OR PRIMARY MD: Findings good per Dr. Mariel Sleet.  INSTRUCTIONS GIVEN AND DISCUSSED: Stop your Tamoxifen today and for 4 weeks.  Call back in 4 weeks to let Dr. Mariel Sleet know whether your pain is improving; if you have had no change in your symptoms, you can resume the Tamoxifen after 4 weeks.  I acknowledge that I have been informed and understand all the instructions given to me and received a copy. I do not have any more questions at this time, but understand that I may call the Specialty Clinic at [facility] at [phone] during business hours should I have any further questions or need assistance in obtaining follow-up care.    __________________________________________  _____________  __________ Signature of Patient or Authorized Representative            Date                   Time    __________________________________________ Nurse's Signature

## 2011-10-20 NOTE — Progress Notes (Signed)
CC:   Billie Lade, Ph.D., M.D. Currie Paris, M.D. Gerrit Friends. Dietrich Pates, MD, [facility] Cristi Loron, M.D. Judie Petit Vilinda Flake  DIAGNOSES: 1. Stage II (T2 N1 M0) high-grade infiltrating ductal carcinoma of the     right breast, 3.5 cm in size, clinically node positive with a     biopsy proven right axillary lymph node diagnosed 04/26/2008.  This     is a grade 3 cancer with LVI but ER positive 99%, PR positive 100%,     HER2/neu 3+ positive with Ki-67 marker high at 45%.  She     participated in an NSABP-41 randomized AC every 3 weeks for 4     cycles followed by paclitaxel and trastuzumab.  She was unable to     tolerate the trastuzumab due to the development of CHF.  She had a     total of 7 treatments of the Taxol before developing severe     weakness and fatigue, as well as grade 1 peripheral neuropathy.     She is still slightly fatigued and tired since therapy with perhaps     mild worsening the last month or 2. 2. Atrial fibrillation on Coumadin. 3. Congestive heart failure. 4. Right shoulder degenerative disease. 5. Degenerative joint disease of the back status post disk surgery by     Dr. Lovell Sheehan with nice improvement. 6. Neutropenic fever requiring hospitalization during her     chemotherapy. 7. Dehydration requiring hospitalization 11/15/2008. 8. Reactive depression over the death of her son within last couple of     years.  Alicia Harrington is still living here in town with her husband, Nadine Counts, who is a retired Development worker, community.  Taiana looks great today but feels like her energy level just is not what it once was, maybe slightly worse.  She had a good summer, good fall, but again the energy just is not what she expects it to be.  She is also aching sometimes in her muscles and joints when she is exercising and even when she is not exercising.  She is on her tamoxifen for the breast cancer.  She has no lumps or bumps that she is aware of.  Bowels are working well.  No GU  symptoms.  She started on the tamoxifen back in June of 2010.  REVIEW OF SYSTEMS:  Otherwise is noncontributory.  PHYSICAL EXAMINATION:  General:  She looks her usual self.  She is 13 now, I think looks younger than her stated age.  Weight is 120 pounds. Her height last one we have was 5 feet 1 inch.  Vital signs:  Her blood pressure is 135/79 left arm sitting position, pulse 75 and slightly irregular.  She has decreased range of motion of the right shoulder. Lymphs:  No adenopathy in the cervical, supraclavicular, infraclavicular, axillary or inguinal areas.  Neck:  She has no obvious thyromegaly.  Lungs:  Her lungs are clear to auscultation and percussion.  Heart:  Shows to me a regular rate, slightly irregular rhythm.  No distinct S3, gallop or murmur.  Breasts:  Negative for any masses.  She has only the scar changes in the right breast and axillary area.  Abdomen:  She has no hepatosplenomegaly.  Her liver edge is palpable but overall spans about 8 cm by percussion on palpation. Extremities:  She has no leg edema.  No real arm edema.  We actually started the tamoxifen on 03/09/2009.  I think she tolerates it well but I am a little concerned about  this aching in her hips, muscles, sometimes bones in the low back, hip area so we are going to stop the tamoxifen for 4 weeks and she is going to call me back.  If she does not notice any improvement she will go back on the drug.  She will be out of town for the holidays so she can call me back when she gets back into town.  She really is mildly depressed in my opinion, I think it is over the loss of her son from a couple years ago.  It is the time of year when he died.  It has been very hard on her.  They actually kept his place in [place] where he lived.  They are renting it out but they go there once a year to stay for a few weeks when it is cold.  So I think there are some issues for her this time of year; I think they are  understandable.  She actually said she saw a psychiatrist, friend of Bob's out in [place], did not say that she was clinically depressed to the point of needing therapy but I think there is a mild amount of depression, I think it is all reactive.  Will check out a sedimentation rate, TSH and I want to get her cancer marker just to play it safe.  Otherwise I will see her in 6 months.    ______________________________ Ladona Horns. Mariel Sleet, MD ESN/MEDQ  D:  10/20/2011  T:  10/20/2011  Job:  [id number]

## 2011-10-20 NOTE — Progress Notes (Signed)
This office note has been dictated.

## 2011-10-21 ENCOUNTER — Ambulatory Visit (HOSPITAL_COMMUNITY)
Admission: RE | Admit: 2011-10-21 | Discharge: 2011-10-21 | Disposition: A | Discharge status: Home / Self Care | Payer: Medicare Other | Source: Ambulatory Visit | Attending: Cardiology | Admitting: Cardiology

## 2011-10-21 LAB — TSH: TSH: 0.735 u[IU]/mL (ref 0.350–4.500)

## 2011-10-21 NOTE — Progress Notes (Signed)
Occupational Therapy Treatment  Patient Details  Name: Alicia Harrington MRN: [id number] Date of Birth: [birth year]  Today's Date: 10/21/2011 Time: 7829-5621 Time Calculation (min): 50 min Manual Therapy [phone] 18' Therapeutic Exercises [phone] 31' Visit#: 8  of 16   Re-eval: 10/22/11    Subjective Symptoms/Limitations Symptoms: S:  Still snapping my bra is painful. Pain Assessment Currently in Pain?: Yes Pain Score:   1 Pain Location: Shoulder Pain Orientation: Right Pain Type: Acute pain  Exercise/Treatments  10/21/11 0700 Shoulder Exercises: Supine Protraction PROM;10 reps;AROM;15 reps Horizontal ABduction PROM;10 reps External Rotation PROM;10 reps;AROM;15 reps Internal Rotation PROM;10 reps;AROM;15 reps Flexion PROM;10 reps;AROM;15 reps ABduction PROM;10 reps;AROM;15 reps Shoulder Exercises: Seated Elevation AROM;15 reps Extension AROM;15 reps Retraction AROM;15 reps Row AROM;15 reps Shoulder Exercises: Therapy Ball Flexion 25 reps ABduction 25 reps Right/Left 5 reps Shoulder Exercises: ROM/Strengthening UBE (Upper Arm Bike) 2' and 2' 1.0 Wall Wash 2' with 1# Thumb Tacks 1' Prot/Ret//Elev/Dep 1'  Manual Therapy Manual Therapy: Myofascial release Myofascial Release: MFR and manual stretching to right shoulder, scapular, trapezius, and SCM to decrease pain and restrictions and increase pain free mobility.  [id number]  Occupational Therapy Assessment and Plan OT Assessment and Plan Clinical Impression Statement: A:  Added weight to wall wash. OT Plan: Add seated AROM, REASSESS   Goals Short Term Goals Time to Complete Short Term Goals: 4 weeks Short Term Goal 1: Patient will be independent with a HEP. Short Term Goal 1 Progress: Progressing toward goal Short Term Goal 2: Patient will increase AROM by 20 for increased ability to reach overhead and hook her bra. Short Term Goal 2 Progress: Progressing toward goal Short Term Goal 3: Patient will  increase right shoulder strength to 4/5 for increased ability to use weight machines at the [facility]. Short Term Goal 3 Progress: Progressing toward goal Short Term Goal 4: Patient will decrease pain to 4/10 in her right shoulder during functional activities. Short Term Goal 4 Progress: Progressing toward goal Short Term Goal 5: Patient will decrease fascial restrictions from max to moderate in her right shoulder region. Short Term Goal 5 Progress: Progressing toward goal Long Term Goals Time to Complete Long Term Goals: 8 weeks Long Term Goal 1: Patient will return to prior level of I with all B/IADLs and leisure activities. Long Term Goal 1 Progress: Progressing toward goal Long Term Goal 2: Patient will increase AROM to WNL for increased ability to reach overhead and fasten her bra. Long Term Goal 2 Progress: Progressing toward goal Long Term Goal 3: Patient will increase right shoulder strength to 4+/5 for increased ability to use weight machines at the [facility]. Long Term Goal 3 Progress: Progressing toward goal Long Term Goal 4: Patient will decrease pain level to 2/10 in her right shoulder region. Long Term Goal 4 Progress: Progressing toward goal Long Term Goal 5: Patient will decrease fascial restrictions to minimal in her right shoulder region. Long Term Goal 5 Progress: Progressing toward goal End of Session Patient Active Problem List  Diagnoses  . HEARING IMPAIRMENT  . HYPERTENSION  . Atrial fibrillation  . Chronic anticoagulation  . Cardiomyopathy  . Sick sinus syndrome  . Chronic kidney disease (CKD), stage I  . Adenocarcinoma, breast  . DJD (degenerative joint disease)  . History of angina  . Macular degeneration, dry  . Pain in joint, shoulder region  . Muscle weakness (generalized)  . Bone spur   End of Session Activity Tolerance: Patient tolerated treatment well General Behavior During Session: [facility] for  tasks performed Cognition: [facility] for tasks performed   Shirlean Mylar, OTR/L  10/21/2011, 4:58 PM

## 2011-10-23 ENCOUNTER — Ambulatory Visit (HOSPITAL_COMMUNITY)
Admission: RE | Admit: 2011-10-23 | Discharge: 2011-10-23 | Disposition: A | Discharge status: Home / Self Care | Payer: Medicare Other | Source: Ambulatory Visit | Attending: Cardiology | Admitting: Cardiology

## 2011-10-23 NOTE — Progress Notes (Signed)
Occupational Therapy Treatment  Patient Details  Name: Alicia Harrington MRN: [id number] Date of Birth: [birth year]  Today's Date: 10/23/2011 Time: 4098-1191 Time Calculation (min): 29 min Manual Therapy [id number] 12' Reassessment [id number]  Visit#: 9  of 16   Re-eval: 11/06/11    Subjective Symptoms/Limitations Symptoms: S:  Its really sore.   Pain Assessment Currently in Pain?: Yes Pain Score:   4 Pain Location: Shoulder Pain Orientation: Right Pain Type: Acute pain  O:  Exercise/Treatments Supine Protraction: PROM;10 reps Horizontal ABduction: PROM;10 reps External Rotation: PROM;10 reps Internal Rotation: PROM;10 reps Flexion: PROM;10 reps ABduction: PROM;10 reps    Manual Therapy Manual Therapy: Myofascial release Myofascial Release: MFR and manual stretching to right shoulder, scapular, trapezius, and SCM to decrease pain and restrictions and increase pain free mobility 1105-1117  Occupational Therapy Assessment and Plan OT Assessment and Plan Clinical Impression Statement: A:  DC therapeutic exercises and focus on heat x 10 min then MFR and strectching.  Please refer to MD progress note. OT Frequency: Min 2X/week OT Duration: 4 weeks OT Plan: P:  DC therapeutic exercises.  Treatment Plan:  heat x 10 min to right shoulder, MFR and manual stretchidng.   Goals Short Term Goals Time to Complete Short Term Goals: 4 weeks Short Term Goal 1: Patient will be independent with a HEP. Short Term Goal 1 Progress: Progressing toward goal Short Term Goal 2: Patient will increase AROM by 20 for increased ability to reach overhead and hook her bra. Short Term Goal 2 Progress: Progressing toward goal Short Term Goal 3: Patient will increase right shoulder strength to 4/5 for increased ability to use weight machines at the [facility]. Short Term Goal 3 Progress: Met Short Term Goal 4: Patient will decrease pain to 4/10 in her right shoulder during functional activities. Short  Term Goal 4 Progress: Progressing toward goal Short Term Goal 5: Patient will decrease fascial restrictions from max to moderate in her right shoulder region. Short Term Goal 5 Progress: Met Long Term Goals Time to Complete Long Term Goals: 8 weeks Long Term Goal 1: Patient will return to prior level of I with all B/IADLs and leisure activities. Long Term Goal 1 Progress: Progressing toward goal Long Term Goal 2: Patient will increase AROM to WNL for increased ability to reach overhead and fasten her bra. Long Term Goal 2 Progress: Progressing toward goal Long Term Goal 3: Patient will increase right shoulder strength to 4+/5 for increased ability to use weight machines at the [facility]. Long Term Goal 3 Progress: Progressing toward goal Long Term Goal 4: Patient will decrease pain level to 2/10 in her right shoulder region. Long Term Goal 4 Progress: Progressing toward goal Long Term Goal 5: Patient will decrease fascial restrictions to minimal in her right shoulder region. Long Term Goal 5 Progress: Progressing toward goal End of Session Patient Active Problem List  Diagnoses  . HEARING IMPAIRMENT  . HYPERTENSION  . Atrial fibrillation  . Chronic anticoagulation  . Cardiomyopathy  . Sick sinus syndrome  . Chronic kidney disease (CKD), stage I  . Adenocarcinoma, breast  . DJD (degenerative joint disease)  . History of angina  . Macular degeneration, dry  . Pain in joint, shoulder region  . Muscle weakness (generalized)  . Bone spur   End of Session Activity Tolerance: Patient tolerated treatment well General Behavior During Session: [facility] for tasks performed Cognition: [facility] for tasks performed   Shirlean Mylar, OTR/L  10/23/2011, 11:44 AM

## 2011-10-27 ENCOUNTER — Ambulatory Visit (HOSPITAL_COMMUNITY): Payer: Medicare Other | Admitting: Specialist

## 2011-10-29 ENCOUNTER — Ambulatory Visit (HOSPITAL_COMMUNITY)
Admission: RE | Admit: 2011-10-29 | Discharge: 2011-10-29 | Disposition: A | Discharge status: Home / Self Care | Payer: Medicare Other | Source: Ambulatory Visit | Attending: Cardiology | Admitting: Cardiology

## 2011-10-29 DIAGNOSIS — I10 Essential (primary) hypertension: Secondary | ICD-10-CM | POA: Insufficient documentation

## 2011-10-29 DIAGNOSIS — M25519 Pain in unspecified shoulder: Secondary | ICD-10-CM | POA: Insufficient documentation

## 2011-10-29 DIAGNOSIS — IMO0001 Reserved for inherently not codable concepts without codable children: Secondary | ICD-10-CM | POA: Insufficient documentation

## 2011-10-29 DIAGNOSIS — M6281 Muscle weakness (generalized): Secondary | ICD-10-CM | POA: Insufficient documentation

## 2011-10-29 DIAGNOSIS — M25619 Stiffness of unspecified shoulder, not elsewhere classified: Secondary | ICD-10-CM | POA: Insufficient documentation

## 2011-10-29 NOTE — Progress Notes (Signed)
Occupational Therapy Treatment  Patient Details  Name: Alicia Harrington MRN: [id number] Date of Birth: December 20, 1931  Today's Date: 10/29/2011 Time: 1020-1056 Time Calculation (min): 36 min Manual Therapy [phone] 25' Moist Heat 1020-1030 10' Visit#: 10  of 16   Re-eval: 11/06/11    Subjective Symptoms/Limitations Symptoms: S:  It is actually been feeling better. Pain Assessment Currently in Pain?: No/denies Pain Score: 0-No pain  O:  Exercise/Treatments PROM to shoulder x 10 reps all ranges. Manual Therapy Manual Therapy: Myofascial release Myofascial Release: MFR and manual stretching to right shoulder, scapular, trapezius, and SCM to decrease pain and restrictions and increase pain free mobility.  [id number] Modalities Modalities: Moist Heat Moist Heat Therapy Number Minutes Moist Heat: 10 Minutes Moist Heat Location: Shoulder  Occupational Therapy Assessment and Plan OT Assessment and Plan Clinical Impression Statement: A:  less pain today. OT Plan: P:  Continue with current treatment plan.   Goals Short Term Goals Time to Complete Short Term Goals: 4 weeks Short Term Goal 1: Patient will be independent with a HEP. Short Term Goal 1 Progress: Progressing toward goal Short Term Goal 2: Patient will increase AROM by 20 for increased ability to reach overhead and hook her bra. Short Term Goal 2 Progress: Progressing toward goal Short Term Goal 3: Patient will increase right shoulder strength to 4/5 for increased ability to use weight machines at the [facility]. Short Term Goal 4: Patient will decrease pain to 4/10 in her right shoulder during functional activities. Short Term Goal 4 Progress: Progressing toward goal Short Term Goal 5: Patient will decrease fascial restrictions from max to moderate in her right shoulder region. Long Term Goals Time to Complete Long Term Goals: 8 weeks Long Term Goal 1: Patient will return to prior level of I with all B/IADLs and leisure  activities. Long Term Goal 1 Progress: Progressing toward goal Long Term Goal 2: Patient will increase AROM to WNL for increased ability to reach overhead and fasten her bra. Long Term Goal 2 Progress: Progressing toward goal Long Term Goal 3: Patient will increase right shoulder strength to 4+/5 for increased ability to use weight machines at the [facility]. Long Term Goal 4: Patient will decrease pain level to 2/10 in her right shoulder region. Long Term Goal 4 Progress: Progressing toward goal Long Term Goal 5: Patient will decrease fascial restrictions to minimal in her right shoulder region. Long Term Goal 5 Progress: Progressing toward goal End of Session Patient Active Problem List  Diagnoses  . HEARING IMPAIRMENT  . HYPERTENSION  . Atrial fibrillation  . Chronic anticoagulation  . Cardiomyopathy  . Sick sinus syndrome  . Chronic kidney disease (CKD), stage I  . Adenocarcinoma, breast  . DJD (degenerative joint disease)  . History of angina  . Macular degeneration, dry  . Pain in joint, shoulder region  . Muscle weakness (generalized)  . Bone spur   End of Session Activity Tolerance: Patient tolerated treatment well General Behavior During Session: [facility] for tasks performed Cognition: [facility] for tasks performed   Shirlean Mylar, OTR/L  10/29/2011, 11:00 AM

## 2011-10-31 ENCOUNTER — Ambulatory Visit (HOSPITAL_COMMUNITY)
Admission: RE | Admit: 2011-10-31 | Discharge: 2011-10-31 | Disposition: A | Discharge status: Home / Self Care | Payer: Medicare Other | Source: Ambulatory Visit | Attending: Cardiology | Admitting: Cardiology

## 2011-10-31 NOTE — Progress Notes (Signed)
Occupational Therapy Treatment  Patient Details  Name: Alicia Harrington MRN: [id number] Date of Birth: 1932-01-13  Today's Date: 10/31/2011 Time: 1020-1100 Time Calculation (min): 40 min Moist Heat 1020-1030 10' Manual Therapy 1032-1100 28' Visit#: 11  of 16   Re-eval: 11/06/11    Subjective Symptoms/Limitations Symptoms: S:  This seems to be helping not doing the exercises. Pain Assessment Currently in Pain?: No/denies Pain Score: 0-No pain  Exercise/Treatments Supine Protraction: PROM;10 reps Horizontal ABduction: PROM;10 reps External Rotation: PROM;10 reps Internal Rotation: PROM;10 reps Flexion: PROM;10 reps ABduction: PROM;10 reps Manual Therapy Manual Therapy: Myofascial release Myofascial Release: MFR and manual stretchidng to right shoulder, scapular, trapezius, and SCM to decrease pain and restrictions and increase pain free mobility.  1032-1100 Moist Heat Therapy Number Minutes Moist Heat: 10 Minutes Moist Heat Location: Shoulder  Occupational Therapy Assessment and Plan OT Assessment and Plan Clinical Impression Statement: A:  Less restrictions noted in right scapular region this date. OT Plan: P:  Reassess end of next week, review HEP.   Goals Short Term Goals Time to Complete Short Term Goals: 4 weeks Short Term Goal 1: Patient will be independent with a HEP. Short Term Goal 2: Patient will increase AROM by 20 for increased ability to reach overhead and hook her bra. Short Term Goal 3: Patient will increase right shoulder strength to 4/5 for increased ability to use weight machines at the [facility]. Short Term Goal 4: Patient will decrease pain to 4/10 in her right shoulder during functional activities. Short Term Goal 5: Patient will decrease fascial restrictions from max to moderate in her right shoulder region. Long Term Goals Time to Complete Long Term Goals: 8 weeks Long Term Goal 1: Patient will return to prior level of I with all B/IADLs and leisure  activities. Long Term Goal 2: Patient will increase AROM to WNL for increased ability to reach overhead and fasten her bra. Long Term Goal 3: Patient will increase right shoulder strength to 4+/5 for increased ability to use weight machines at the [facility]. Long Term Goal 4: Patient will decrease pain level to 2/10 in her right shoulder region. Long Term Goal 5: Patient will decrease fascial restrictions to minimal in her right shoulder region. End of Session Patient Active Problem List  Diagnoses  . HEARING IMPAIRMENT  . HYPERTENSION  . Atrial fibrillation  . Chronic anticoagulation  . Cardiomyopathy  . Sick sinus syndrome  . Chronic kidney disease (CKD), stage I  . Adenocarcinoma, breast  . DJD (degenerative joint disease)  . History of angina  . Macular degeneration, dry  . Pain in joint, shoulder region  . Muscle weakness (generalized)  . Bone spur   End of Session Activity Tolerance: Patient tolerated treatment well General Behavior During Session: [facility] for tasks performed Cognition: [facility] for tasks performed   Shirlean Mylar, OTR/L  10/31/2011, 11:04 AM

## 2011-11-03 ENCOUNTER — Ambulatory Visit (HOSPITAL_COMMUNITY)
Admission: RE | Admit: 2011-11-03 | Discharge: 2011-11-03 | Disposition: A | Discharge status: Home / Self Care | Payer: Medicare Other | Source: Ambulatory Visit | Attending: Cardiology | Admitting: Cardiology

## 2011-11-03 NOTE — Progress Notes (Signed)
Occupational Therapy Treatment  Patient Details  Name: Alicia Harrington MRN: [id number] Date of Birth: May 20, 1932  Today's Date: 11/03/2011 Time: 4098-1191 Time Calculation (min): 48 min Visit#: 12  of 16   Re-eval: 11/06/11 Manual Therapy [id number]  32' Moist Heat 1135-1150 15'     Subjective Symptoms/Limitations Symptoms: S:  My arm is better, I still have some discomfort but it is moving better. Pain Assessment Currently in Pain?: Yes Pain Score:   4 Pain Location: Shoulder Pain Orientation: Right Pain Type: Acute pain   Exercise/Treatments Supine Protraction: PROM;10 reps Horizontal ABduction: PROM;10 reps External Rotation: PROM;10 reps Internal Rotation: PROM;10 reps Flexion: PROM;10 reps ABduction: PROM;10 reps  Modalities Modalities: Moist Heat Manual Therapy Manual Therapy: Myofascial release Myofascial Release: MFR and manual stretching to right shoulder, scapular, trapezius, and SCM to decrease pain and restrictions and increase pain free mobility. Moist Heat Therapy Number Minutes Moist Heat: 15 Minutes Moist Heat Location: Shoulder  Occupational Therapy Assessment and Plan OT Assessment and Plan Clinical Impression Statement: A:  Tenderness with palpation on superior border of scapula.  Pt states she can now sleep through the night. Rehab Potential: Excellent OT Plan: P:  Reassess.   Goals Short Term Goals Time to Complete Short Term Goals: 4 weeks Short Term Goal 1: Patient will be independent with a HEP. Short Term Goal 2: Patient will increase AROM by 20 for increased ability to reach overhead and hook her bra. Short Term Goal 3: Patient will increase right shoulder strength to 4/5 for increased ability to use weight machines at the [facility]. Short Term Goal 4: Patient will decrease pain to 4/10 in her right shoulder during functional activities. Short Term Goal 5: Patient will decrease fascial restrictions from max to moderate in her right  shoulder region. Long Term Goals Time to Complete Long Term Goals: 8 weeks Long Term Goal 1: Patient will return to prior level of I with all B/IADLs and leisure activities. Long Term Goal 2: Patient will increase AROM to WNL for increased ability to reach overhead and fasten her bra. Long Term Goal 3: Patient will increase right shoulder strength to 4+/5 for increased ability to use weight machines at the [facility]. Long Term Goal 4: Patient will decrease pain level to 2/10 in her right shoulder region. Long Term Goal 5: Patient will decrease fascial restrictions to minimal in her right shoulder region. End of Session Patient Active Problem List  Diagnoses  . HEARING IMPAIRMENT  . HYPERTENSION  . Atrial fibrillation  . Chronic anticoagulation  . Cardiomyopathy  . Sick sinus syndrome  . Chronic kidney disease (CKD), stage I  . Adenocarcinoma, breast  . DJD (degenerative joint disease)  . History of angina  . Macular degeneration, dry  . Pain in joint, shoulder region  . Muscle weakness (generalized)  . Bone spur   End of Session Activity Tolerance: Patient tolerated treatment well General Behavior During Session: [facility] for tasks performed Cognition: [facility] for tasks performed   Shamieka Gullo L. Cheyne Bungert, COTA/L  11/03/2011, 12:17 PM

## 2011-11-07 ENCOUNTER — Ambulatory Visit (HOSPITAL_COMMUNITY)
Admission: RE | Admit: 2011-11-07 | Discharge: 2011-11-07 | Disposition: A | Discharge status: Home / Self Care | Payer: Medicare Other | Source: Ambulatory Visit | Attending: Cardiology | Admitting: Cardiology

## 2011-11-07 NOTE — Progress Notes (Addendum)
Occupational Therapy Treatment  Patient Details  Name: Alicia Harrington MRN: [id number] Date of Birth: 30-Jan-1932  Today's Date: 11/07/2011 Time: 9147-8295 Time Calculation (min): 32 min Visit#: 13  of 16   Re-eval: 11/07/11   Heat 6213-0865 10' Manual Therapy 1030-1050 20' Subjective Symptoms/Limitations Symptoms: S:  Im ready for my trip. Pain Assessment Currently in Pain?: Yes Pain Score:   4 Pain Orientation: Right Pain Type: Acute pain  Exercise/Treatments Supine Protraction: PROM;10 reps Horizontal ABduction: PROM;10 reps External Rotation: PROM;10 reps Internal Rotation: PROM;10 reps Flexion: PROM;10 reps ABduction: PROM;10 reps     Manual Therapy Manual Therapy: Myofascial release Myofascial Release: MFR and manual stretcing to right shoulder, scapular, trapezius, and SCM to decrease pain and restrictions and increase pain free mobility. 1030-1050 Moist Heat Therapy Number Minutes Moist Heat: 10 Minutes Moist Heat Location: Shoulder  Occupational Therapy Assessment and Plan OT Assessment and Plan Clinical Impression Statement: A:  Issued Tband for scapular stability as HEP as patient is discharged this date. OT Plan: P:  DC with HEP.   Goals Short Term Goals Time to Complete Short Term Goals: 4 weeks Short Term Goal 1: Patient will be independent with a HEP. Short Term Goal 1 Progress: Met Short Term Goal 2: Patient will increase AROM by 20 for increased ability to reach overhead and hook her bra. Short Term Goal 2 Progress: Met Short Term Goal 3: Patient will increase right shoulder strength to 4/5 for increased ability to use weight machines at the [facility]. Short Term Goal 3 Progress: Met Short Term Goal 4: Patient will decrease pain to 4/10 in her right shoulder during functional activities. Short Term Goal 4 Progress: Met Short Term Goal 5: Patient will decrease fascial restrictions from max to moderate in her right shoulder region. Short Term Goal 5  Progress: Met Long Term Goals Time to Complete Long Term Goals: 8 weeks Long Term Goal 1: Patient will return to prior level of I with all B/IADLs and leisure activities. Long Term Goal 1 Progress: Progressing toward goal Long Term Goal 2: Patient will increase AROM to WNL for increased ability to reach overhead and fasten her bra. Long Term Goal 2 Progress: Progressing toward goal Long Term Goal 3: Patient will increase right shoulder strength to 4+/5 for increased ability to use weight machines at the [facility]. Long Term Goal 3 Progress: Progressing toward goal Long Term Goal 4: Patient will decrease pain level to 2/10 in her right shoulder region. Long Term Goal 4 Progress: Progressing toward goal Long Term Goal 5: Patient will decrease fascial restrictions to minimal in her right shoulder region. Long Term Goal 5 Progress: Progressing toward goal End of Session Patient Active Problem List  Diagnoses  . HEARING IMPAIRMENT  . HYPERTENSION  . Atrial fibrillation  . Chronic anticoagulation  . Cardiomyopathy  . Sick sinus syndrome  . Chronic kidney disease (CKD), stage I  . Adenocarcinoma, breast  . DJD (degenerative joint disease)  . History of angina  . Macular degeneration, dry  . Pain in joint, shoulder region  . Muscle weakness (generalized)  . Bone spur   End of Session Activity Tolerance: Patient tolerated treatment well General Behavior During Session: [facility] for tasks performed Cognition: [facility] for tasks performed   Shirlean Mylar, OTR/L  11/07/2011, 11:03 AM

## 2011-11-12 ENCOUNTER — Other Ambulatory Visit: Payer: Self-pay | Admitting: Cardiology

## 2011-11-13 ENCOUNTER — Ambulatory Visit (INDEPENDENT_AMBULATORY_CARE_PROVIDER_SITE_OTHER): Payer: Medicare Other | Admitting: *Deleted

## 2011-11-13 DIAGNOSIS — Z7901 Long term (current) use of anticoagulants: Secondary | ICD-10-CM

## 2011-11-13 DIAGNOSIS — I4891 Unspecified atrial fibrillation: Secondary | ICD-10-CM

## 2011-11-14 ENCOUNTER — Ambulatory Visit: Admit: 2011-11-14 | Payer: Self-pay | Admitting: Internal Medicine

## 2011-11-14 SURGERY — COLONOSCOPY
Anesthesia: Moderate Sedation

## 2011-11-27 ENCOUNTER — Encounter (INDEPENDENT_AMBULATORY_CARE_PROVIDER_SITE_OTHER): Payer: Self-pay | Admitting: *Deleted

## 2011-12-27 ENCOUNTER — Other Ambulatory Visit: Payer: Self-pay | Admitting: Cardiology

## 2011-12-29 ENCOUNTER — Encounter: Payer: Medicare Other | Admitting: *Deleted

## 2012-01-01 ENCOUNTER — Ambulatory Visit (INDEPENDENT_AMBULATORY_CARE_PROVIDER_SITE_OTHER): Payer: Medicare Other | Admitting: *Deleted

## 2012-01-01 DIAGNOSIS — Z7901 Long term (current) use of anticoagulants: Secondary | ICD-10-CM

## 2012-01-01 DIAGNOSIS — I4891 Unspecified atrial fibrillation: Secondary | ICD-10-CM

## 2012-01-02 ENCOUNTER — Encounter (INDEPENDENT_AMBULATORY_CARE_PROVIDER_SITE_OTHER): Payer: Self-pay | Admitting: *Deleted

## 2012-01-02 ENCOUNTER — Other Ambulatory Visit (INDEPENDENT_AMBULATORY_CARE_PROVIDER_SITE_OTHER): Payer: Self-pay | Admitting: *Deleted

## 2012-01-02 DIAGNOSIS — Z1211 Encounter for screening for malignant neoplasm of colon: Secondary | ICD-10-CM

## 2012-01-02 DIAGNOSIS — Z853 Personal history of malignant neoplasm of breast: Secondary | ICD-10-CM

## 2012-01-21 ENCOUNTER — Telehealth (INDEPENDENT_AMBULATORY_CARE_PROVIDER_SITE_OTHER): Payer: Self-pay | Admitting: *Deleted

## 2012-01-21 NOTE — Telephone Encounter (Signed)
agree

## 2012-01-21 NOTE — Telephone Encounter (Signed)
Requesting MD/PCP:  rothbart     Name & DOB: Alicia Harrington [birth year]     Procedure: TCS  Reason/Indication:  screening  Has patient had this procedure before?  yes  If so, when, by whom and where?  10 yrs ago  Is there a family history of colon cancer?  no  Who?  What age when diagnosed?    Is patient diabetic?   no      Does patient have prosthetic heart valve?  no  Do you have a pacemaker?  no  Has patient had joint replacement within last 12 months?  no  Is patient on Coumadin, Plavix and/or Aspirin? yes  Medications: coumadin 5 mg daily (Rothbart), furosemide 20 mg daily, klor-con 20 meq daily, diovan 80 mg daily, lopressor 50 mg 1/2 tab daily, digoxin 0.25 mg daily, glucosamine bid, fish oil, vit d vitamins, celebrex  Allergies: knda  Pharmacy:   Medication Adjustment: coumdain 5 days  Procedure date & time: 02/11/12 at 1200

## 2012-02-04 ENCOUNTER — Encounter (HOSPITAL_COMMUNITY): Payer: Self-pay | Admitting: Pharmacy Technician

## 2012-02-04 DIAGNOSIS — H905 Unspecified sensorineural hearing loss: Secondary | ICD-10-CM | POA: Diagnosis not present

## 2012-02-04 DIAGNOSIS — H903 Sensorineural hearing loss, bilateral: Secondary | ICD-10-CM | POA: Diagnosis not present

## 2012-02-04 DIAGNOSIS — H8020 Cochlear otosclerosis, unspecified ear: Secondary | ICD-10-CM | POA: Diagnosis not present

## 2012-02-05 ENCOUNTER — Ambulatory Visit (INDEPENDENT_AMBULATORY_CARE_PROVIDER_SITE_OTHER): Payer: Medicare Other | Admitting: *Deleted

## 2012-02-05 DIAGNOSIS — Z7901 Long term (current) use of anticoagulants: Secondary | ICD-10-CM

## 2012-02-05 DIAGNOSIS — I4891 Unspecified atrial fibrillation: Secondary | ICD-10-CM

## 2012-02-10 MED ORDER — SODIUM CHLORIDE 0.45 % IV SOLN
Freq: Once | INTRAVENOUS | Status: AC
  Administered 2012-02-11: 11:00:00 via INTRAVENOUS

## 2012-02-11 ENCOUNTER — Ambulatory Visit (HOSPITAL_COMMUNITY)
Admission: RE | Admit: 2012-02-11 | Discharge: 2012-02-11 | Disposition: A | Discharge status: Home / Self Care | Payer: Medicare Other | Source: Ambulatory Visit | Attending: Internal Medicine | Admitting: Internal Medicine

## 2012-02-11 ENCOUNTER — Encounter (HOSPITAL_COMMUNITY)
Admission: RE | Disposition: A | Discharge status: Home / Self Care | Payer: Self-pay | Source: Ambulatory Visit | Attending: Internal Medicine

## 2012-02-11 ENCOUNTER — Encounter (HOSPITAL_COMMUNITY): Payer: Self-pay | Admitting: *Deleted

## 2012-02-11 DIAGNOSIS — Z853 Personal history of malignant neoplasm of breast: Secondary | ICD-10-CM | POA: Diagnosis not present

## 2012-02-11 DIAGNOSIS — I10 Essential (primary) hypertension: Secondary | ICD-10-CM | POA: Insufficient documentation

## 2012-02-11 DIAGNOSIS — K644 Residual hemorrhoidal skin tags: Secondary | ICD-10-CM | POA: Insufficient documentation

## 2012-02-11 DIAGNOSIS — D126 Benign neoplasm of colon, unspecified: Secondary | ICD-10-CM | POA: Insufficient documentation

## 2012-02-11 DIAGNOSIS — Z7982 Long term (current) use of aspirin: Secondary | ICD-10-CM | POA: Insufficient documentation

## 2012-02-11 DIAGNOSIS — K5730 Diverticulosis of large intestine without perforation or abscess without bleeding: Secondary | ICD-10-CM | POA: Insufficient documentation

## 2012-02-11 DIAGNOSIS — Z1211 Encounter for screening for malignant neoplasm of colon: Secondary | ICD-10-CM

## 2012-02-11 DIAGNOSIS — Z79899 Other long term (current) drug therapy: Secondary | ICD-10-CM | POA: Insufficient documentation

## 2012-02-11 DIAGNOSIS — Z79810 Long term (current) use of selective estrogen receptor modulators (SERMs): Secondary | ICD-10-CM | POA: Insufficient documentation

## 2012-02-11 HISTORY — DX: Other complications of anesthesia, initial encounter: T88.59XA

## 2012-02-11 HISTORY — DX: Nausea with vomiting, unspecified: R11.2

## 2012-02-11 HISTORY — PX: COLONOSCOPY: SHX5424

## 2012-02-11 HISTORY — DX: Adverse effect of unspecified anesthetic, initial encounter: T41.45XA

## 2012-02-11 HISTORY — DX: Other specified postprocedural states: Z98.890

## 2012-02-11 SURGERY — COLONOSCOPY
Anesthesia: Moderate Sedation

## 2012-02-11 MED ORDER — MIDAZOLAM HCL 5 MG/5ML IJ SOLN
INTRAMUSCULAR | Status: DC | PRN
  Administered 2012-02-11: 1 mg via INTRAVENOUS
  Administered 2012-02-11: 2 mg via INTRAVENOUS
  Administered 2012-02-11: 1 mg via INTRAVENOUS

## 2012-02-11 MED ORDER — MIDAZOLAM HCL 5 MG/5ML IJ SOLN
INTRAMUSCULAR | Status: AC
  Filled 2012-02-11: qty 10

## 2012-02-11 MED ORDER — FENTANYL CITRATE 0.05 MG/ML IJ SOLN
INTRAMUSCULAR | Status: DC | PRN
  Administered 2012-02-11 (×2): 25 ug via INTRAVENOUS

## 2012-02-11 MED ORDER — FENTANYL CITRATE 0.05 MG/ML IJ SOLN
INTRAMUSCULAR | Status: AC
  Filled 2012-02-11: qty 2

## 2012-02-11 NOTE — Op Note (Signed)
COLONOSCOPY PROCEDURE REPORT  PATIENT:  Alicia Harrington  MR#:  [id number] Birthdate:  04-Jan-1932, 78 y.o., female Endoscopist:  Dr. Malissa Hippo, MD Referred By:  Dr. Gerrit Friends. Dietrich Pates, MD Procedure Date: 02/11/2012  Procedure:   Colonoscopy and snare polypectomy.  Indications:  Patient is a 78 year old Caucasian female undergoing average risk screening colonoscopy. Personal history is significant for breast CA and she remains in remission.  Informed Consent:  The procedure and risks were reviewed with the patient and informed consent was obtained.  Medications:  Fentanyl 50 mg IV Versed 4 mg IV  Description of procedure:  After a digital rectal exam was performed, that colonoscope was advanced from the anus through the rectum and colon to the area of the cecum, ileocecal valve and appendiceal orifice. The cecum was deeply intubated. These structures were well-seen and photographed for the record. From the level of the cecum and ileocecal valve, the scope was slowly and cautiously withdrawn. The mucosal surfaces were carefully surveyed utilizing scope tip to flexion to facilitate fold flattening as needed. The scope was pulled down into the rectum where a thorough exam including retroflexion was performed.  Findings:   Prep excellent. 7 mm polyp snared from hepatic flexure. 4 mm polyp a bit of a cold biopsy from the splenic flexure. Redundant sigmoid colon with  few small diverticula colon. Small hemorrhoids below the dentate line  Therapeutic/Diagnostic Maneuvers Performed:  See above  Complications:  None  Cecal Withdrawal Time:  16 minutes  Impression:  Examination performed to cecum. 7 mm  polyp snared from hepatic flexure. 4 mm polyp ablated via cold biopsy from splenic flexure. Few small diverticula in sigmoid colon. Small external hemorrhoids.  Recommendations:  Standard instructions given. Resume warfarin at usual dose starting this evening.  Charmian Forbis U   02/11/2012 12:15 PM  CC: Dr. [place] Bing, MD, MD & Dr. Bonnetta Barry ref. provider found

## 2012-02-11 NOTE — Discharge Instructions (Signed)
Resume usual medications including warfarin to be started this evening. Do not take celecoxib for one week. Resume usual diet. No driving every 24 hours. Physician will contact you with biopsy results Colon Polyps A polyp is extra tissue that grows inside your body. Colon polyps grow in the large intestine. The large intestine, also called the colon, is part of your digestive system. It is a long, hollow tube at the end of your digestive tract where your body makes and stores stool. Most polyps are not dangerous. They are benign. This means they are not cancerous. But over time, some types of polyps can turn into cancer. Polyps that are smaller than a pea are usually not harmful. But larger polyps could someday become or may already be cancerous. To be safe, doctors remove all polyps and test them.  WHO GETS POLYPS? Anyone can get polyps, but certain people are more likely than others. You may have a greater chance of getting polyps if:  You are over 50.   You have had polyps before.   Someone in your family has had polyps.   Someone in your family has had cancer of the large intestine.   Find out if someone in your family has had polyps. You may also be more likely to get polyps if you:   Eat a lot of fatty foods.   Smoke.   Drink alcohol.   Do not exercise.   Eat too much.  SYMPTOMS  Most small polyps do not cause symptoms. People often do not know they have one until their caregiver finds it during a regular checkup or while testing them for something else. Some people do have symptoms like these:  Bleeding from the anus. You might notice blood on your underwear or on toilet paper after you have had a bowel movement.   Constipation or diarrhea that lasts more than a week.   Blood in the stool. Blood can make stool look black or it can show up as red streaks in the stool.  If you have any of these symptoms, see your caregiver. HOW DOES THE DOCTOR TEST FOR POLYPS? The doctor  can use four tests to check for polyps:  Digital rectal exam. The caregiver wears gloves and checks your rectum (the last part of the large intestine) to see if it feels normal. This test would find polyps only in the rectum. Your caregiver may need to do one of the other tests listed below to find polyps higher up in the intestine.   Barium enema. The caregiver puts a liquid called barium into your rectum before taking x-rays of your large intestine. Barium makes your intestine look white in the pictures. Polyps are dark, so they are easy to see.   Sigmoidoscopy. With this test, the caregiver can see inside your large intestine. A thin flexible tube is placed into your rectum. The device is called a sigmoidoscope, which has a light and a tiny video camera in it. The caregiver uses the sigmoidoscope to look at the last third of your large intestine.   Colonoscopy. This test is like sigmoidoscopy, but the caregiver looks at all of the large intestine. It usually requires sedation. This is the most common method for finding and removing polyps.  TREATMENT   The caregiver will remove the polyp during sigmoidoscopy or colonoscopy. The polyp is then tested for cancer.   If you have had polyps, your caregiver may want you to get tested regularly in the future.  PREVENTION  There is not one sure way to prevent polyps. You might be able to lower your risk of getting them if you:  Eat more fruits and vegetables and less fatty food.   Do not smoke.   Avoid alcohol.   Exercise every day.   Lose weight if you are overweight.   Eating more calcium and folate can also lower your risk of getting polyps. Some foods that are rich in calcium are milk, cheese, and broccoli. Some foods that are rich in folate are chickpeas, kidney beans, and spinach.   Aspirin might help prevent polyps. Studies are under way.  Document Released: 08/06/2004 Document Revised: 10/30/2011 Document Reviewed:  01/12/2008 [facility] Patient Information 2012 [place], [place].Hemorrhoids Hemorrhoids are enlarged (dilated) veins around the rectum. There are 2 types of hemorrhoids, and the type of hemorrhoid is determined by its location. Internal hemorrhoids occur in the veins just inside the rectum.They are usually not painful, but they may bleed.However, they may poke through to the outside and become irritated and painful. External hemorrhoids involve the veins outside the anus and can be felt as a painful swelling or hard lump near the anus.They are often itchy and may crack and bleed. Sometimes clots will form in the veins. This makes them swollen and painful. These are called thrombosed hemorrhoids. CAUSES Causes of hemorrhoids include:  Pregnancy. This increases the pressure in the hemorrhoidal veins.   Constipation.   Straining to have a bowel movement.   Obesity.   Heavy lifting or other activity that caused you to strain.  TREATMENT Most of the time hemorrhoids improve in 1 to 2 weeks. However, if symptoms do not seem to be getting better or if you have a lot of rectal bleeding, your caregiver may perform a procedure to help make the hemorrhoids get smaller or remove them completely.Possible treatments include:  Rubber band ligation. A rubber band is placed at the base of the hemorrhoid to cut off the circulation.   Sclerotherapy. A chemical is injected to shrink the hemorrhoid.   Infrared light therapy. Tools are used to burn the hemorrhoid.   Hemorrhoidectomy. This is surgical removal of the hemorrhoid.  HOME CARE INSTRUCTIONS   Increase fiber in your diet. Ask your caregiver about using fiber supplements.   Drink enough water and fluids to keep your urine clear or pale yellow.   Exercise regularly.   Go to the bathroom when you have the urge to have a bowel movement. Do not wait.   Avoid straining to have bowel movements.   Keep the anal area dry and clean.   Only take  over-the-counter or prescription medicines for pain, discomfort, or fever as directed by your caregiver.  If your hemorrhoids are thrombosed:  Take warm sitz baths for 20 to 30 minutes, 3 to 4 times per day.   If the hemorrhoids are very tender and swollen, place ice packs on the area as tolerated. Using ice packs between sitz baths may be helpful. Fill a plastic bag with ice. Place a towel between the bag of ice and your skin.   Medicated creams and suppositories may be used or applied as directed.   Do not use a donut-shaped pillow or sit on the toilet for long periods. This increases blood pooling and pain.  SEEK MEDICAL CARE IF:   You have increasing pain and swelling that is not controlled with your medicine.   You have uncontrolled bleeding.   You have difficulty or you are unable to have a  bowel movement.   You have pain or inflammation outside the area of the hemorrhoids.   You have chills or an oral temperature above 102 F (38.9 C).  MAKE SURE YOU:   Understand these instructions.   Will watch your condition.   Will get help right away if you are not doing well or get worse.  Document Released: 11/07/2000 Document Revised: 10/30/2011 Document Reviewed: 03/14/2008 [facility] Patient Information 2012 [place], [place].Diverticulosis Diverticulosis is a common condition that develops when small pouches (diverticula) form in the wall of the colon. The risk of diverticulosis increases with age. It happens more often in people who eat a low-fiber diet. Most individuals with diverticulosis have no symptoms. Those individuals with symptoms usually experience abdominal pain, constipation, or loose stools (diarrhea). HOME CARE INSTRUCTIONS   Increase the amount of fiber in your diet as directed by your caregiver or dietician. This may reduce symptoms of diverticulosis.   Your caregiver may recommend taking a dietary fiber supplement.   Drink at least 6 to 8 glasses of water each day  to prevent constipation.   Try not to strain when you have a bowel movement.   Your caregiver may recommend avoiding nuts and seeds to prevent complications, although this is still an uncertain benefit.   Only take over-the-counter or prescription medicines for pain, discomfort, or fever as directed by your caregiver.  FOODS WITH HIGH FIBER CONTENT INCLUDE:  Fruits. Apple, peach, pear, tangerine, raisins, prunes.   Vegetables. Brussels sprouts, asparagus, broccoli, cabbage, carrot, cauliflower, romaine lettuce, spinach, summer squash, tomato, winter squash, zucchini.   Starchy Vegetables. Baked beans, kidney beans, lima beans, split peas, lentils, potatoes (with skin).   Grains. Whole wheat bread, brown rice, bran flake cereal, plain oatmeal, white rice, shredded wheat, bran muffins.  SEEK IMMEDIATE MEDICAL CARE IF:   You develop increasing pain or severe bloating.   You have an oral temperature above 102 F (38.9 C), not controlled by medicine.   You develop vomiting or bowel movements that are bloody or black.  Document Released: 08/07/2004 Document Revised: 10/30/2011 Document Reviewed: 04/10/2010 [facility] Patient Information 2012 [place], [place].Colonoscopy Care After Read the instructions outlined below and refer to this sheet in the next few weeks. These discharge instructions provide you with general information on caring for yourself after you leave the hospital. Your doctor may also give you specific instructions. While your treatment has been planned according to the most current medical practices available, unavoidable complications occasionally occur. If you have any problems or questions after discharge, call your doctor. HOME CARE INSTRUCTIONS ACTIVITY:  You may resume your regular activity, but move at a slower pace for the next 24 hours.   Take frequent rest periods for the next 24 hours.   Walking will help get rid of the air and reduce the bloated feeling in your  belly (abdomen).   No driving for 24 hours (because of the medicine (anesthesia) used during the test).   You may shower.   Do not sign any important legal documents or operate any machinery for 24 hours (because of the anesthesia used during the test).  NUTRITION:  Drink plenty of fluids.   You may resume your normal diet as instructed by your doctor.   Begin with a light meal and progress to your normal diet. Heavy or fried foods are harder to digest and may make you feel sick to your stomach (nauseated).   Avoid alcoholic beverages for 24 hours or as instructed.  MEDICATIONS:  You may resume your normal medications unless your doctor tells you otherwise.  WHAT TO EXPECT TODAY:  Some feelings of bloating in the abdomen.   Passage of more gas than usual.   Spotting of blood in your stool or on the toilet paper.  IF YOU HAD POLYPS REMOVED DURING THE COLONOSCOPY:  No aspirin products for 7 days or as instructed.   No alcohol for 7 days or as instructed.   Eat a soft diet for the next 24 hours.  FINDING OUT THE RESULTS OF YOUR TEST Not all test results are available during your visit. If your test results are not back during the visit, make an appointment with your caregiver to find out the results. Do not assume everything is normal if you have not heard from your caregiver or the medical facility. It is important for you to follow up on all of your test results.  SEEK IMMEDIATE MEDICAL CARE IF:  You have more than a spotting of blood in your stool.   Your belly is swollen (abdominal distention).   You are nauseated or vomiting.   You have a fever.   You have abdominal pain or discomfort that is severe or gets worse throughout the day.  Document Released: 06/24/2004 Document Revised: 10/30/2011 Document Reviewed: 06/22/2008 [facility] Patient Information 2012 [place], [place].

## 2012-02-11 NOTE — H&P (Signed)
Alicia Harrington is an 78 y.o. female.   Chief Complaint: Patient is here for colonoscopy. HPI: Patient is a 77 year old Caucasian female who is in for screening colonoscopy. Last exam was in 2004. She denies abdominal pain change in bowel habits or rectal bleeding. Personal history is significant for breast CA for  which she had lumpectomy followed by chemotherapy and radiation therapy and remains in remission. Family history is negative for colorectal carcinoma the  Past Medical History  Diagnosis Date  . Cardiomyopathy 2009    possibly secondary to Adriamycin; EF 40% in 12/09 and 50% 3/10; pulmonary edema in 2009  . Sick sinus syndrome     Paroxysmal to permanent AF; initially first degree AV block also noted  . Chronic kidney disease (CKD), stage I     when on diuretic with creatinine of 1.6  . Hypertension   . Adenocarcinoma, breast     treated with lumpectomy, node dissection, chemo and RT  . Hearing impairment   . DJD (degenerative joint disease)     right knee and lumbosacral spine  . History of angina 2001    normal cornary arteries in 2001  . Macular degeneration, dry     + cataracts  . Chronic anticoagulation 04/16/2011  . Complication of anesthesia     patient vomits with demerol  . PONV (postoperative nausea and vomiting)     Past Surgical History  Procedure Date  . Appendectomy   . Dilation and curettage of uterus   . Middle ear surgery     Left  . Tonsillectomy   . Microdiscectomy lumbar 2009    lumbosacral spine  . Mastectomy partial / lumpectomy w/ axillary lymphadenectomy 2009    Carcinoma of the breast    Family History  Problem Relation Age of Onset  . Heart failure Mother   . Sudden death Father 72   Social History:  reports that she quit smoking about 56 years ago. She has never used smokeless tobacco. She reports that she drinks alcohol. She reports that she does not use illicit drugs.  Allergies:  Allergies  Allergen Reactions  . Altace  (Ramipril) Rash    Medications Prior to Admission  Medication Dose Route Frequency Provider Last Rate Last Dose  . 0.45 % sodium chloride infusion   Intravenous Once Malissa Hippo, MD 20 mL/hr at 02/11/12 1055    . fentaNYL (SUBLIMAZE) 0.05 MG/ML injection           . midazolam (VERSED) 5 MG/5ML injection            Medications Prior to Admission  Medication Sig Dispense Refill  . furosemide (LASIX) 20 MG tablet Take 20-40 mg by mouth as needed. For fluid      . glucosamine-chondroitin 500-400 MG tablet Take 1 tablet by mouth 2 (two) times daily.        Marland Kitchen LANOXIN 0.25 MG tablet TAKE (1) TABLET BY MOUTH ON MONDAY, WED, AND FRI., 1/2 TABLET ON ALL OTHER DAYS.  30 each  6  . metoprolol succinate (TOPROL-XL) 25 MG 24 hr tablet Take 25 mg by mouth daily.        . Multiple Vitamin (MULITIVITAMIN WITH MINERALS) TABS Take 1 tablet by mouth daily.      . potassium chloride SA (KLOR-CON M20) 20 MEQ tablet Take 20 mEq by mouth as needed. For potassium replacement. Only takes when taking Lasix      . tamoxifen (NOLVADEX) 20 MG tablet Take 20 mg by mouth  daily.        . traMADol-acetaminophen (ULTRACET) 37.5-325 MG per tablet Take 1-2 tablets by mouth every 8 (eight) hours as needed. For pain      . warfarin (COUMADIN) 5 MG tablet Take 5 mg by mouth daily.       . celecoxib (CELEBREX) 200 MG capsule Take 200 mg by mouth daily. Patient takes this 6 weeks on and then goes off       . cholecalciferol (VITAMIN D) 1000 UNITS tablet Take 1,000 Units by mouth daily.        . Coenzyme Q10 (CO Q-10) 300 MG CAPS Take 300 mg by mouth daily.        Marland Kitchen DIOVAN 80 MG tablet TAKE ONE TABLET DAILY.  90 each  1  . sodium fluoride-calcium carbonate (FLORICAL) 8.3-364 MG CAPS Take 2 capsules by mouth daily.          No results found for this or any previous visit (from the past 48 hour(s)). No results found.  Review of Systems  Constitutional: Negative for weight loss.  Respiratory: Negative for shortness of breath.    Cardiovascular: Negative for chest pain and orthopnea.  Gastrointestinal: Negative for abdominal pain, diarrhea, constipation, blood in stool and melena.    Blood pressure 132/79, pulse 81, temperature 97.7 F (36.5 C), temperature source Oral, resp. rate 16, height 5' (1.524 m), weight 113 lb (51.256 kg), SpO2 100.00%. Physical Exam  Constitutional: She appears well-developed and well-nourished.  HENT:  Mouth/Throat: Oropharynx is clear and moist.  Eyes: Conjunctivae are normal. No scleral icterus.  Neck: No thyromegaly present.  Cardiovascular: Normal rate, regular rhythm and normal heart sounds.   No murmur heard. Respiratory: Effort normal.  GI: Soft. She exhibits no distension. There is no tenderness.  Musculoskeletal: She exhibits no edema.  Lymphadenopathy:    She has no cervical adenopathy.  Neurological: She is alert.  Skin: Skin is warm and dry.     Assessment/Plan Average risk screening colonoscopy.  Staphanie Harbison U 02/11/2012, 11:17 AM

## 2012-02-13 ENCOUNTER — Encounter (HOSPITAL_COMMUNITY): Payer: Self-pay | Admitting: Internal Medicine

## 2012-02-15 ENCOUNTER — Encounter: Payer: Self-pay | Admitting: Cardiology

## 2012-02-18 ENCOUNTER — Ambulatory Visit (INDEPENDENT_AMBULATORY_CARE_PROVIDER_SITE_OTHER): Payer: Medicare Other | Admitting: *Deleted

## 2012-02-18 DIAGNOSIS — Z7901 Long term (current) use of anticoagulants: Secondary | ICD-10-CM

## 2012-02-18 DIAGNOSIS — I4891 Unspecified atrial fibrillation: Secondary | ICD-10-CM | POA: Diagnosis not present

## 2012-02-18 LAB — POCT INR: INR: 1.7

## 2012-02-19 ENCOUNTER — Encounter (INDEPENDENT_AMBULATORY_CARE_PROVIDER_SITE_OTHER): Payer: Self-pay | Admitting: *Deleted

## 2012-02-20 ENCOUNTER — Encounter: Payer: Self-pay | Admitting: Cardiology

## 2012-02-20 DIAGNOSIS — Z853 Personal history of malignant neoplasm of breast: Secondary | ICD-10-CM | POA: Diagnosis not present

## 2012-02-23 ENCOUNTER — Ambulatory Visit (INDEPENDENT_AMBULATORY_CARE_PROVIDER_SITE_OTHER): Payer: Medicare Other | Admitting: *Deleted

## 2012-02-23 DIAGNOSIS — Z7901 Long term (current) use of anticoagulants: Secondary | ICD-10-CM

## 2012-02-23 DIAGNOSIS — I4891 Unspecified atrial fibrillation: Secondary | ICD-10-CM

## 2012-03-29 ENCOUNTER — Emergency Department (HOSPITAL_COMMUNITY): Payer: Medicare Other

## 2012-03-29 ENCOUNTER — Emergency Department (HOSPITAL_COMMUNITY)
Admission: EM | Admit: 2012-03-29 | Discharge: 2012-03-29 | Disposition: A | Discharge status: Home / Self Care | Payer: Medicare Other | Attending: Emergency Medicine | Admitting: Emergency Medicine

## 2012-03-29 ENCOUNTER — Encounter (HOSPITAL_COMMUNITY): Payer: Self-pay

## 2012-03-29 DIAGNOSIS — N189 Chronic kidney disease, unspecified: Secondary | ICD-10-CM | POA: Diagnosis not present

## 2012-03-29 DIAGNOSIS — R112 Nausea with vomiting, unspecified: Secondary | ICD-10-CM | POA: Insufficient documentation

## 2012-03-29 DIAGNOSIS — I129 Hypertensive chronic kidney disease with stage 1 through stage 4 chronic kidney disease, or unspecified chronic kidney disease: Secondary | ICD-10-CM | POA: Diagnosis not present

## 2012-03-29 DIAGNOSIS — R109 Unspecified abdominal pain: Secondary | ICD-10-CM | POA: Insufficient documentation

## 2012-03-29 DIAGNOSIS — R141 Gas pain: Secondary | ICD-10-CM | POA: Insufficient documentation

## 2012-03-29 DIAGNOSIS — R143 Flatulence: Secondary | ICD-10-CM | POA: Insufficient documentation

## 2012-03-29 DIAGNOSIS — R142 Eructation: Secondary | ICD-10-CM | POA: Insufficient documentation

## 2012-03-29 LAB — DIFFERENTIAL
Basophils Relative: 0 % (ref 0–1)
Eosinophils Absolute: 0 10*3/uL (ref 0.0–0.7)
Eosinophils Relative: 0 % (ref 0–5)
Neutrophils Relative %: 91 % — ABNORMAL HIGH (ref 43–77)

## 2012-03-29 LAB — CBC
MCH: 35.3 pg — ABNORMAL HIGH (ref 26.0–34.0)
MCHC: 33.9 g/dL (ref 30.0–36.0)
MCV: 104.1 fL — ABNORMAL HIGH (ref 78.0–100.0)
Platelets: 180 10*3/uL (ref 150–400)

## 2012-03-29 LAB — COMPREHENSIVE METABOLIC PANEL
ALT: 26 U/L (ref 0–35)
Albumin: 4.2 g/dL (ref 3.5–5.2)
Alkaline Phosphatase: 79 U/L (ref 39–117)
BUN: 19 mg/dL (ref 6–23)
Calcium: 9.4 mg/dL (ref 8.4–10.5)
Potassium: 3.6 mEq/L (ref 3.5–5.1)
Sodium: 139 mEq/L (ref 135–145)
Total Protein: 7.2 g/dL (ref 6.0–8.3)

## 2012-03-29 LAB — PROTIME-INR: Prothrombin Time: 24.3 seconds — ABNORMAL HIGH (ref 11.6–15.2)

## 2012-03-29 LAB — TROPONIN I: Troponin I: <0.3 ng/mL (ref <0.30)

## 2012-03-29 MED ORDER — ONDANSETRON HCL 4 MG/2ML IJ SOLN
4.0000 mg | Freq: Once | INTRAMUSCULAR | Status: AC
  Administered 2012-03-29: 4 mg via INTRAVENOUS
  Filled 2012-03-29: qty 2

## 2012-03-29 MED ORDER — ONDANSETRON 4 MG PO TBDP: 4.0000 mg | ORAL_TABLET | Freq: Three times a day (TID) | ORAL | Status: AC | PRN

## 2012-03-29 MED ORDER — SODIUM CHLORIDE 0.9 % IV BOLUS (SEPSIS)
1000.0000 mL | Freq: Once | INTRAVENOUS | Status: AC
  Administered 2012-03-29: 1000 mL via INTRAVENOUS

## 2012-03-29 MED ORDER — FENTANYL CITRATE 0.05 MG/ML IJ SOLN
25.0000 ug | Freq: Once | INTRAMUSCULAR | Status: AC
  Administered 2012-03-29: 25 ug via INTRAVENOUS
  Filled 2012-03-29: qty 2

## 2012-03-29 MED ORDER — FENTANYL CITRATE 0.05 MG/ML IJ SOLN
50.0000 ug | Freq: Once | INTRAMUSCULAR | Status: AC
  Administered 2012-03-29: 50 ug via INTRAVENOUS
  Filled 2012-03-29: qty 2

## 2012-03-29 MED ORDER — OXYCODONE-ACETAMINOPHEN 5-325 MG PO TABS: ORAL_TABLET | ORAL | Status: DC

## 2012-03-29 NOTE — ED Notes (Signed)
Notified dr. Colon Branch that patient was requesting something else for nausea and pain. Dr. Colon Branch stated to give 25 mcg of fentanyl and 4 mg of zofran iv. Also notified me to change patient's fluid to a kvo rate.

## 2012-03-29 NOTE — ED Provider Notes (Signed)
History     CSN: [id number]  Arrival date & time 03/29/12  0419   First MD Initiated Contact with Patient 03/29/12 [phone]      Chief Complaint  Patient presents with  . Emesis  . Abdominal Pain    (Consider location/radiation/quality/duration/timing/severity/associated sxs/prior treatment) HPI   CONSEPCION UTT is a 78 y.o. female with a h/o cardiomyopathy, atrial fibrillation on chronic coumadin, renal insufficiency,  who presents to the Emergency Department complaining of sudden onset nausea and vomiting associated with abdominal distention and cramping that began after eating dinner tonight. Now with dry heaving and continued abdominal pain. Denies fever, chills, chest pain, shortness of breath.   Had an episode of nausea and vomiting while on vacation  Last week after walking 1.5 miles. No associated chest pain or shortness of breath.   PCP Dr. Margo Aye   Past Medical History  Diagnosis Date  . Cardiomyopathy 2009    possibly secondary to Adriamycin; EF 40% in 12/09 and 50% 3/10; pulmonary edema in 2009  . Sick sinus syndrome     Paroxysmal to permanent AF; initially first degree AV block also noted  . Chronic kidney disease (CKD), stage I     when on diuretic with creatinine of 1.6  . Hypertension   . Adenocarcinoma, breast     treated with lumpectomy, node dissection, chemo and RT  . Hearing impairment   . DJD (degenerative joint disease)     right knee and lumbosacral spine  . History of angina 2001    normal cornary arteries in 2001  . Macular degeneration, dry     + cataracts  . Chronic anticoagulation 04/16/2011  . Complication of anesthesia     patient vomits with demerol  . PONV (postoperative nausea and vomiting)     Past Surgical History  Procedure Date  . Appendectomy   . Dilation and curettage of uterus   . Middle ear surgery     Left  . Tonsillectomy   . Microdiscectomy lumbar 2009    lumbosacral spine  . Mastectomy partial / lumpectomy w/  axillary lymphadenectomy 2009    Carcinoma of the breast  . Colonoscopy 02/11/2012    Colonoscopy plus polypectomy x2, Malissa Hippo, MD;  [facility]    Family History  Problem Relation Age of Onset  . Heart failure Mother   . Sudden death Father 47    History  Substance Use Topics  . Smoking status: Former Smoker    Quit date: 12/23/1955  . Smokeless tobacco: Never Used   Comment: quit at age 56  . Alcohol Use: Yes     one glass of wine per day    OB History    Grav Para Term Preterm Abortions TAB SAB Ect Mult Living                  Review of Systems  Constitutional: Negative for fever.       10 Systems reviewed and are negative for acute change except as noted in the HPI.  HENT: Negative for congestion.   Eyes: Negative for discharge and redness.  Respiratory: Negative for cough and shortness of breath.   Cardiovascular: Negative for chest pain.  Gastrointestinal: Positive for nausea, vomiting, abdominal pain and abdominal distention.  Musculoskeletal: Negative for back pain.  Skin: Negative for rash.  Neurological: Negative for syncope, numbness and headaches.  Psychiatric/Behavioral:       No behavior change.    Allergies  Altace  Home  Medications   Current Outpatient Rx  Name Route Sig Dispense Refill  . VITAMIN D 1000 UNITS PO TABS Oral Take 1,000 Units by mouth daily.      . CO Q-10 300 MG PO CAPS Oral Take 300 mg by mouth daily.      Marland Kitchen DIOVAN 80 MG PO TABS  TAKE ONE TABLET DAILY. 90 each 1  . FUROSEMIDE 20 MG PO TABS Oral Take 20-40 mg by mouth as needed. For fluid    . GLUCOSAMINE-CHONDROITIN 500-400 MG PO TABS Oral Take 1 tablet by mouth 2 (two) times daily.      Marland Kitchen LANOXIN 0.25 MG PO TABS  TAKE (1) TABLET BY MOUTH ON MONDAY, WED, AND FRI., 1/2 TABLET ON ALL OTHER DAYS. 30 each 6  . METOPROLOL SUCCINATE ER 25 MG PO TB24 Oral Take 25 mg by mouth daily.      . ADULT MULTIVITAMIN W/MINERALS CH Oral Take 1 tablet by mouth daily.    Marland Kitchen POTASSIUM CHLORIDE  CRYS ER 20 MEQ PO TBCR Oral Take 20 mEq by mouth as needed. For potassium replacement. Only takes when taking Lasix    . SOD FLUORIDE-CA CARBONATE 8.3-364 MG PO CAPS Oral Take 2 capsules by mouth daily.      Marland Kitchen TAMOXIFEN CITRATE 20 MG PO TABS Oral Take 20 mg by mouth daily.      . WARFARIN SODIUM 5 MG PO TABS Oral Take 5 mg by mouth daily.     . TRAMADOL-ACETAMINOPHEN 37.5-325 MG PO TABS Oral Take 1-2 tablets by mouth every 8 (eight) hours as needed. For pain      BP 182/82  Pulse 79  Temp(Src) 97.6 F (36.4 C) (Oral)  Resp 16  Ht 5\' 1"  (1.549 m)  Wt 120 lb (54.432 kg)  BMI 22.67 kg/m2  SpO2 98%  Physical Exam  Nursing note and vitals reviewed. Constitutional:       Awake, alert, nontoxic appearance.  HENT:  Head: Atraumatic.  Eyes: Right eye exhibits no discharge. Left eye exhibits no discharge.  Neck: Neck supple.  Cardiovascular: Normal rate.        Irregular rhythm  Pulmonary/Chest: Effort normal. She exhibits no tenderness.  Abdominal: Soft. She exhibits no distension. There is no tenderness. There is no rebound.       Hyperactive bowel sounds  Musculoskeletal: She exhibits no tenderness.       Baseline ROM, no obvious new focal weakness.  Neurological:       Mental status and motor strength appears baseline for patient and situation.  Skin: No rash noted.  Psychiatric: She has a normal mood and affect.    ED Course  Procedures (including critical care time)   Date: 03/29/2012  0457  Rate:74  Rhythm: atrial fibrillation  QRS Axis: left  Intervals: atrial fibrillation  ST/T Wave abnormalities: normal  Conduction Disutrbances:a fib  Narrative Interpretation:   Old EKG Reviewed: changes noted c/w 01/16/09    MDM  Patient with onset of vomiting and stomach cramping this evening. Given IV fluids, antiemetics, analgesics, with some relief. Labs are unremarkable. Abdominal series with chest x-ray did not show any acute process, no obstruction.Coumadin is therapeutic.  EKG shows atrial fibrillation which is the patient's baseline.Dx testing d/w pt and husband.  Questions answered.  Verb understanding, agreeable to d/c home with outpt f/u. Pt feels improved after observation and/or treatment in ED.Pt stable in ED with no significant deterioration in condition.The patient appears reasonably screened and/or stabilized for discharge and I  doubt any other medical condition or other [facility] requiring further screening, evaluation, or treatment in the ED at this time prior to discharge.  MDM Reviewed: nursing note and vitals Interpretation: ECG, labs and x-ray           Nicoletta Dress. Colon Branch, MD 03/29/12 [phone]

## 2012-03-29 NOTE — ED Notes (Signed)
Pt stable at discharge no vomiting denies nausea

## 2012-03-29 NOTE — ED Notes (Signed)
Pt vomiting yellow bile.

## 2012-03-29 NOTE — ED Notes (Signed)
Pt reports decreased nausea and pain; trail of ice chips; instructed a few at a time

## 2012-03-29 NOTE — ED Notes (Signed)
Notified Dr. Colon Branch that patient's oxygen saturation had dropped to 80% on room air and was staying at 83%. I placed patient on 3 liters of oxygen via nasal canula. Oxygen saturation back up to 94% after being placed on oxygen.

## 2012-03-29 NOTE — ED Notes (Signed)
Onset of vomiting and stomach cramping after eating dinner tonight, now with dry heaves

## 2012-03-29 NOTE — ED Notes (Signed)
Patient given ice chips, just started to try eating some.

## 2012-03-30 ENCOUNTER — Other Ambulatory Visit: Payer: Self-pay | Admitting: Cardiology

## 2012-03-30 ENCOUNTER — Encounter: Payer: Self-pay | Admitting: Cardiology

## 2012-03-30 ENCOUNTER — Other Ambulatory Visit: Payer: Self-pay | Admitting: *Deleted

## 2012-03-30 ENCOUNTER — Ambulatory Visit: Payer: Self-pay | Admitting: *Deleted

## 2012-03-30 DIAGNOSIS — R7309 Other abnormal glucose: Secondary | ICD-10-CM

## 2012-03-30 DIAGNOSIS — I4891 Unspecified atrial fibrillation: Secondary | ICD-10-CM

## 2012-03-30 DIAGNOSIS — R7301 Impaired fasting glucose: Secondary | ICD-10-CM | POA: Insufficient documentation

## 2012-03-30 DIAGNOSIS — Z7901 Long term (current) use of anticoagulants: Secondary | ICD-10-CM

## 2012-03-31 LAB — HEMOGLOBIN A1C: Mean Plasma Glucose: 105 mg/dL (ref <117)

## 2012-04-02 ENCOUNTER — Ambulatory Visit (INDEPENDENT_AMBULATORY_CARE_PROVIDER_SITE_OTHER): Payer: Medicare Other | Admitting: Cardiology

## 2012-04-02 ENCOUNTER — Encounter: Payer: Self-pay | Admitting: Cardiology

## 2012-04-02 VITALS — BP 78/62 | HR 76 | Resp 16 | Ht 61.0 in | Wt 115.0 lb

## 2012-04-02 DIAGNOSIS — I951 Orthostatic hypotension: Secondary | ICD-10-CM | POA: Diagnosis not present

## 2012-04-02 DIAGNOSIS — I4891 Unspecified atrial fibrillation: Secondary | ICD-10-CM

## 2012-04-02 DIAGNOSIS — Z7901 Long term (current) use of anticoagulants: Secondary | ICD-10-CM

## 2012-04-02 DIAGNOSIS — M25519 Pain in unspecified shoulder: Secondary | ICD-10-CM

## 2012-04-02 DIAGNOSIS — I428 Other cardiomyopathies: Secondary | ICD-10-CM | POA: Diagnosis not present

## 2012-04-02 DIAGNOSIS — R7301 Impaired fasting glucose: Secondary | ICD-10-CM

## 2012-04-02 NOTE — Assessment & Plan Note (Signed)
Patient reports arthralgias and myalgias, which are chronic.  She has tried to discontinue a number of her medications including tamoxifen, but symptoms did not resolve.  She will continue to treat these symptomatically.

## 2012-04-02 NOTE — Progress Notes (Deleted)
Name: Alicia Harrington    DOB: [birth year]  Age: 78 y.o.  MR#: [id number]       PCP:  Millwood Bing, MD, MD      Insurance: @PAYORNAME @   CC:    Chief Complaint  Patient presents with  . Appointment    some lisghtheadedness +med list    VS BP 98/73  Pulse 92  Resp 16  Ht 5\' 1"  (1.549 m)  Wt 115 lb (52.164 kg)  BMI 21.73 kg/m2  Weights Current Weight  04/02/12 115 lb (52.164 kg)  03/29/12 120 lb (54.432 kg)  02/11/12 113 lb (51.256 kg)    Blood Pressure  BP Readings from Last 3 Encounters:  04/02/12 98/73  03/29/12 134/49  02/11/12 142/76     Admit date:  (Not on file) Last encounter with RMR:  03/30/2012   Allergy Allergies  Allergen Reactions  . Altace (Ramipril) Rash    Current Outpatient Prescriptions  Medication Sig Dispense Refill  . cholecalciferol (VITAMIN D) 1000 UNITS tablet Take 1,000 Units by mouth daily.        . Coenzyme Q10 (CO Q-10) 300 MG CAPS Take 300 mg by mouth daily.        Marland Kitchen DIOVAN 80 MG tablet TAKE ONE TABLET DAILY.  90 each  1  . furosemide (LASIX) 20 MG tablet Take 20-40 mg by mouth as needed. For fluid      . glucosamine-chondroitin 500-400 MG tablet Take 1 tablet by mouth 2 (two) times daily.        Marland Kitchen LANOXIN 0.25 MG tablet TAKE (1) TABLET BY MOUTH ON MONDAY, WED, AND FRI., 1/2 TABLET ON ALL OTHER DAYS.  30 each  6  . metoprolol succinate (TOPROL-XL) 25 MG 24 hr tablet Take 25 mg by mouth daily.        . Multiple Vitamin (MULITIVITAMIN WITH MINERALS) TABS Take 1 tablet by mouth daily.      . ondansetron (ZOFRAN ODT) 4 MG disintegrating tablet Take 1 tablet (4 mg total) by mouth every 8 (eight) hours as needed for nausea.  20 tablet  0  . oxyCODONE-acetaminophen (PERCOCET) 5-325 MG per tablet Use 1/2 - 1 PO Q 4 hours as needed for pain  20 tablet  0  . potassium chloride SA (KLOR-CON M20) 20 MEQ tablet Take 20 mEq by mouth as needed. For potassium replacement. Only takes when taking Lasix      . tamoxifen (NOLVADEX) 20 MG tablet Take 20 mg  by mouth daily.        Marland Kitchen warfarin (COUMADIN) 5 MG tablet Take 5 mg by mouth daily.         Discontinued Meds:    Medications Discontinued During This Encounter  Medication Reason  . meloxicam (MOBIC) 15 MG tablet Error  . sodium fluoride-calcium carbonate (FLORICAL) 8.3-364 MG CAPS Error  . traMADol-acetaminophen (ULTRACET) 37.5-325 MG per tablet Error    Patient Active Problem List  Diagnoses  . HEARING IMPAIRMENT  . HYPERTENSION  . Atrial fibrillation  . Chronic anticoagulation  . Cardiomyopathy  . Sick sinus syndrome  . Chronic kidney disease (CKD), stage I  . Adenocarcinoma, breast  . DJD (degenerative joint disease)  . History of angina  . Macular degeneration, dry  . Pain in joint, shoulder region  . Muscle weakness (generalized)  . Bone spur  . Fasting hyperglycemia    LABS Orders Only on 03/30/2012  Component Date Value  . Hemoglobin A1C 03/30/2012 5.3   . Mean Plasma Glucose  03/30/2012 105   Admission on 03/29/2012, Discharged on 03/29/2012  Component Date Value  . WBC 03/29/2012 8.5   . RBC 03/29/2012 3.88   . Hemoglobin 03/29/2012 13.7   . HCT 03/29/2012 40.4   . MCV 03/29/2012 104.1*  . MCH 03/29/2012 35.3*  . MCHC 03/29/2012 33.9   . RDW 03/29/2012 14.3   . Platelets 03/29/2012 180   . Neutrophils Relative 03/29/2012 91*  . Neutro Abs 03/29/2012 7.7   . Lymphocytes Relative 03/29/2012 5*  . Lymphs Abs 03/29/2012 0.5*  . Monocytes Relative 03/29/2012 4   . Monocytes Absolute 03/29/2012 0.3   . Eosinophils Relative 03/29/2012 0   . Eosinophils Absolute 03/29/2012 0.0   . Basophils Relative 03/29/2012 0   . Basophils Absolute 03/29/2012 0.0   . Sodium 03/29/2012 139   . Potassium 03/29/2012 3.6   . Chloride 03/29/2012 97   . CO2 03/29/2012 23   . Glucose, Bld 03/29/2012 181*  . BUN 03/29/2012 19   . Creatinine, Ser 03/29/2012 0.89   . Calcium 03/29/2012 9.4   . Total Protein 03/29/2012 7.2   . Albumin 03/29/2012 4.2   . AST 03/29/2012 35     . ALT 03/29/2012 26   . Alkaline Phosphatase 03/29/2012 79   . Total Bilirubin 03/29/2012 0.8   . GFR calc non Af Amer 03/29/2012 61*  . GFR calc Af Amer 03/29/2012 71*  . Prothrombin Time 03/29/2012 24.3*  . INR 03/29/2012 2.14*  . Troponin I 03/29/2012 <0.30   Anti-coag visit on 02/23/2012  Component Date Value  . INR 02/23/2012 2.1   Anti-coag visit on 02/18/2012  Component Date Value  . INR 02/18/2012 1.7   Anti-coag visit on 02/05/2012  Component Date Value  . INR 02/05/2012 3.0      Results for this Opt Visit:     Results for orders placed in visit on 03/30/12  HEMOGLOBIN A1C      Component Value Range   Hemoglobin A1C 5.3  <5.7 (%)   Mean Plasma Glucose 105  <117 (mg/dL)    EKG Orders placed during the hospital encounter of 03/29/12  . ED EKG  . ED EKG  . EKG 12-LEAD  . EKG 12-LEAD  . EKG     Prior Assessment and Plan Problem List as of 04/02/2012          Cardiology Problems   HYPERTENSION   Last Assessment & Plan Note   09/22/2011 Office Visit Signed 09/22/2011 11:56 AM by Kathlen Brunswick, MD    Blood pressure control is adequate.  Tetanus toxoid injection has been ordered.    Atrial fibrillation   Last Assessment & Plan Note   09/22/2011 Office Visit Signed 09/22/2011 11:53 AM by Kathlen Brunswick, MD    Control of heart rate in atrial fibrillation remains excellent.  Current therapy will be continued.    Cardiomyopathy   Last Assessment & Plan Note   09/22/2011 Office Visit Addendum 09/24/2011  4:13 PM by Kathlen Brunswick, MD    She has no active congestive heart failure, but does note intermittent pedal edema and weight gain.  Rather than increase her daily dose of furosemide, we will consider current weight as dry weight and increase her daily dose of furosemide to 40 mg per day whenever her weight increases 4 pounds, continuing at that dosage until her weight returns to baseline.    Sick sinus syndrome     Other   HEARING IMPAIRMENT    Chronic anticoagulation  Last Assessment & Plan Note   09/22/2011 Office Visit Signed 09/22/2011 11:54 AM by Kathlen Brunswick, MD    Patient has not checked his stool for Hemoccult testing over the past 12 months-specimens have been requested.  Her last CBC 5 months ago showed a minimal anemia-this will be repeated.  Screening colonoscopy was recommended-she will contact Dr. Karilyn Cota to perform this study.    Chronic kidney disease (CKD), stage I   Last Assessment & Plan Note   09/22/2011 Office Visit Signed 09/22/2011 11:55 AM by Kathlen Brunswick, MD    Creatinine most recently 0.84 in 03/2011; renal function is normal for age.    Adenocarcinoma, breast   Last Assessment & Plan Note   09/22/2011 Office Visit Signed 09/22/2011 11:52 AM by Kathlen Brunswick, MD    Patient remains free of apparent recurrence and is followed closely by Dr. Mariel Sleet.    DJD (degenerative joint disease)   Last Assessment & Plan Note   09/22/2011 Office Visit Addendum 09/24/2011  4:14 PM by Kathlen Brunswick, MD    Ultracet 1-2 tabs 3 times a day as needed prescribed for days when she is unable to tolerate Celebrex.  She will discuss with Dr. Karilyn Cota whether he can suggest additional evaluation or treatment that will help her to tolerate nonsteroidals.    History of angina   Macular degeneration, dry   Pain in joint, shoulder region   Muscle weakness (generalized)   Bone spur   Fasting hyperglycemia       Imaging: Dg Abd Acute W/chest  03/29/2012  *RADIOLOGY REPORT*  Clinical Data: Nausea and vomiting; abdominal discomfort.  ACUTE ABDOMEN SERIES (ABDOMEN 2 VIEW & CHEST 1 VIEW)  Comparison: CT of the lumbar spine performed 06/30/2008  Findings: The lungs are well-aerated.  Diffuse peribronchial thickening is noted, with chronically increased interstitial markings.  Bibasilar airspace opacities likely reflect atelectasis. There is no evidence of pleural effusion or pneumothorax.  The cardiomediastinal  silhouette is borderline enlarged.  Clips are noted overlying the right axilla.  The visualized bowel gas pattern is unremarkable.  Scattered stool and air are seen within the colon; there is no evidence of small bowel dilatation to suggest obstruction.  No free intra-abdominal air is identified on the provided upright view.  No acute osseous abnormalities are seen; the sacroiliac joints are unremarkable in appearance.  IMPRESSION:  1.  Unremarkable bowel gas pattern; no free intra-abdominal air seen. 2.  Diffuse peribronchial thickening; bibasilar airspace opacities likely reflect atelectasis.  Lungs otherwise grossly clear.  Original Report Authenticated By: Tonia Ghent, M.D.     [facility] Calculation: Score not calculated. Missing: Total Cholesterol

## 2012-04-02 NOTE — Patient Instructions (Addendum)
Your physician recommends that you schedule a follow-up appointment in: 6 month follow up  Increase fluids and salt intake for the next few days  Your physician has recommended you make the following change in your medication:  1 - Hold Diovan and lasix until orthostatic dizziness has subsided, with a STANDING systolic blood pressure of >100

## 2012-04-02 NOTE — Assessment & Plan Note (Signed)
CHF is now compensated, and recovery of left ventricular systolic function was previously documented.  Current therapy will be continued.

## 2012-04-02 NOTE — Assessment & Plan Note (Addendum)
Patient is significantly orthostatic with typical orthostatic symptoms.  Etiology is likely dehydration related to recent GI illness.  The potential occurrence and danger of syncope were discussed with her and her husband.  She is advised to increase fluid and salt intake and not to walk unassisted until standing blood pressure is greater than 100.  Valsartan and diuretic will be held for a few days until symptoms have resolved and blood pressure has recovered.  Patient requested to call for additional problems.  Otherwise, I will see her again in 6 months.

## 2012-04-02 NOTE — Progress Notes (Signed)
Patient ID: Alicia Harrington, female   DOB: [birth year], 78 y.o.   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  She has been traveling, as is frequently the case, generally without difficulty.  Some fluid retention was noted during a recent trip to [place] that responded to a slight increase in her dose of diuretics.  Exercise tolerance has been better than usual-she is walking up to 1.5 miles at a good pace.  She notes occasional sputum production and cough.  She recently developed nausea, emesis and abdominal cramping prompting evaluation in the emergency department.  Basic laboratories and an abdominal x-ray were negative prompting symptomatic treatment.  Chest x-ray showed mild chronic interstitial changes.  She subsequently developed profuse diarrhea and orthostatic lightheadedness.  Both are improving at present.  No emesis x4 days.  Prior to Admission medications   Medication Sig Start Date End Date Taking? Authorizing Provider  cholecalciferol (VITAMIN D) 1000 UNITS tablet Take 1,000 Units by mouth daily.     Yes Historical Provider, MD  Coenzyme Q10 (CO Q-10) 300 MG CAPS Take 300 mg by mouth daily.     Yes Historical Provider, MD  DIOVAN 80 MG tablet TAKE ONE TABLET DAILY. 04/14/11  Yes Kathlen Brunswick, MD  furosemide (LASIX) 20 MG tablet Take 20-40 mg by mouth as needed. For fluid   Yes Historical Provider, MD  glucosamine-chondroitin 500-400 MG tablet Take 1 tablet by mouth 2 (two) times daily.     Yes Historical Provider, MD  LANOXIN 0.25 MG tablet TAKE (1) TABLET BY MOUTH ON MONDAY, WED, AND FRI., 1/2 TABLET ON ALL OTHER DAYS. 12/27/11  Yes Kathlen Brunswick, MD  metoprolol succinate (TOPROL-XL) 25 MG 24 hr tablet Take 25 mg by mouth daily.     Yes Historical Provider, MD  Multiple Vitamin (MULITIVITAMIN WITH MINERALS) TABS Take 1 tablet by  mouth daily.   Yes Historical Provider, MD  ondansetron (ZOFRAN ODT) 4 MG disintegrating tablet Take 1 tablet (4 mg total) by mouth every 8 (eight) hours as needed for nausea. 03/29/12 04/05/12 Yes Terry S. Colon Branch, MD  oxyCODONE-acetaminophen (PERCOCET) 5-325 MG per tablet Use 1/2 - 1 PO Q 4 hours as needed for pain 03/29/12  Yes Nicoletta Dress. Colon Branch, MD  potassium chloride SA (KLOR-CON M20) 20 MEQ tablet Take 20 mEq by mouth as needed. For potassium replacement. Only takes when taking Lasix   Yes Historical Provider, MD  tamoxifen (NOLVADEX) 20 MG tablet Take 20 mg by mouth daily.     Yes Historical Provider, MD  warfarin (COUMADIN) 5 MG tablet Take 5 mg by mouth daily.  11/12/11  Yes Kathlen Brunswick, MD   Allergies  Allergen Reactions  . Altace (Ramipril) Rash     Past medical history, social history, and family history reviewed and updated.  ROS: Notes intermittent lightheadedness over the past few days.  No syncope or falls.  Walking up to 1.5 miles without dyspnea.  He notes some myalgias and arthralgias, but no chest discomfort.  All other systems reviewed and are negative.  PHYSICAL EXAM: BP 98/73  Pulse 92  Resp 16  Ht 5\' 1"  (1.549 m)  Wt 52.164 kg (115 lb)  BMI 21.73 kg/m2 ; 20 mmHg decrease in blood pressure, lying to standing General-Well developed; no acute distress Body habitus-proportionate weight and height Neck-No JVD; no carotid bruits Lungs-clear lung fields; resonant to  percussion Cardiovascular-normal PMI; normal S1 and S2; modest systolic murmur at the upper left sternal border; irregular rhythm Abdomen-normal bowel sounds; soft and non-tender without masses or organomegaly Musculoskeletal-No deformities, no cyanosis or clubbing Neurologic-Normal cranial nerves; symmetric strength and tone Skin-Warm, no significant lesions Extremities-distal pulses intact; no edema  EKG: Tracing performed earlier this week obtained and reviewed: Atrial fibrillation with controlled  ventricular response, left axis deviation, slightly delayed R-wave progression, minor nonspecific T wave abnormality.  ASSESSMENT AND PLAN:  Shevlin Bing, MD 04/02/2012 11:31 AM

## 2012-04-02 NOTE — Assessment & Plan Note (Signed)
Chronic atrial fibrillation persists.  Patient was evaluated by Dr. Johney Frame with mutual agreement that she should continue current therapy.  Anticoagulation has been stable and therapeutic including as measured in the emergency department a few days ago.

## 2012-04-02 NOTE — Assessment & Plan Note (Signed)
Stool recently Hemoccult negative x3.  Colonoscopy in 01/2012 revealed 2 benign polyps.

## 2012-04-09 ENCOUNTER — Ambulatory Visit (HOSPITAL_COMMUNITY): Payer: Medicare Other | Admitting: Oncology

## 2012-04-15 ENCOUNTER — Encounter: Payer: Self-pay | Admitting: Oncology

## 2012-04-21 ENCOUNTER — Ambulatory Visit (INDEPENDENT_AMBULATORY_CARE_PROVIDER_SITE_OTHER): Payer: Medicare Other | Admitting: *Deleted

## 2012-04-21 DIAGNOSIS — Z7901 Long term (current) use of anticoagulants: Secondary | ICD-10-CM

## 2012-04-21 DIAGNOSIS — I4891 Unspecified atrial fibrillation: Secondary | ICD-10-CM | POA: Diagnosis not present

## 2012-04-21 LAB — POCT INR: INR: 2.5

## 2012-05-26 ENCOUNTER — Encounter (HOSPITAL_COMMUNITY): Payer: Medicare Other | Attending: Oncology | Admitting: Oncology

## 2012-05-26 ENCOUNTER — Ambulatory Visit (HOSPITAL_COMMUNITY)
Admission: RE | Admit: 2012-05-26 | Discharge: 2012-05-26 | Disposition: A | Discharge status: Home / Self Care | Payer: Medicare Other | Source: Ambulatory Visit | Attending: Oncology | Admitting: Oncology

## 2012-05-26 ENCOUNTER — Encounter (HOSPITAL_COMMUNITY): Payer: Self-pay | Admitting: Oncology

## 2012-05-26 VITALS — BP 128/74 | HR 61 | Temp 98.3°F | Ht 61.0 in | Wt 119.4 lb

## 2012-05-26 DIAGNOSIS — M79609 Pain in unspecified limb: Secondary | ICD-10-CM | POA: Insufficient documentation

## 2012-05-26 DIAGNOSIS — Z09 Encounter for follow-up examination after completed treatment for conditions other than malignant neoplasm: Secondary | ICD-10-CM | POA: Diagnosis not present

## 2012-05-26 DIAGNOSIS — M1990 Unspecified osteoarthritis, unspecified site: Secondary | ICD-10-CM

## 2012-05-26 DIAGNOSIS — M25519 Pain in unspecified shoulder: Secondary | ICD-10-CM | POA: Insufficient documentation

## 2012-05-26 DIAGNOSIS — Z853 Personal history of malignant neoplasm of breast: Secondary | ICD-10-CM | POA: Insufficient documentation

## 2012-05-26 DIAGNOSIS — I4891 Unspecified atrial fibrillation: Secondary | ICD-10-CM | POA: Diagnosis not present

## 2012-05-26 DIAGNOSIS — M129 Arthropathy, unspecified: Secondary | ICD-10-CM | POA: Insufficient documentation

## 2012-05-26 DIAGNOSIS — M19049 Primary osteoarthritis, unspecified hand: Secondary | ICD-10-CM | POA: Diagnosis not present

## 2012-05-26 DIAGNOSIS — C50919 Malignant neoplasm of unspecified site of unspecified female breast: Secondary | ICD-10-CM | POA: Diagnosis not present

## 2012-05-26 DIAGNOSIS — I509 Heart failure, unspecified: Secondary | ICD-10-CM | POA: Diagnosis not present

## 2012-05-26 DIAGNOSIS — G609 Hereditary and idiopathic neuropathy, unspecified: Secondary | ICD-10-CM | POA: Diagnosis not present

## 2012-05-26 NOTE — Patient Instructions (Addendum)
Alicia Harrington  [id number] 27-Mar-1932 Dr. Glenford Peers   [facility]  Discharge Instructions  RECOMMENDATIONS MADE BY THE CONSULTANT AND ANY TEST RESULTS WILL BE SENT TO YOUR REFERRING DOCTOR.   EXAM FINDINGS BY MD TODAY AND SIGNS AND SYMPTOMS TO REPORT TO CLINIC OR PRIMARY MD: Exam and discussion per MD.  Will get a consult with Dr. Kellie Simmering, check some labs today and have you get xrays of your hands as you leave today.  MEDICATIONS PRESCRIBED: none   INSTRUCTIONS GIVEN AND DISCUSSED: Other :  Report any new lumps,bone pain or shortness of breath.  SPECIAL INSTRUCTIONS/FOLLOW-UP: Return to Clinic in 6 months to see MD.   I acknowledge that I have been informed and understand all the instructions given to me and received a copy. I do not have any more questions at this time, but understand that I may call the Specialty Clinic at [facility] at [phone] during business hours should I have any further questions or need assistance in obtaining follow-up care.    __________________________________________  _____________  __________ Signature of Patient or Authorized Representative            Date                   Time    __________________________________________ Nurse's Signature

## 2012-05-26 NOTE — Progress Notes (Signed)
Alicia Harrington presented for [employer]. Labs per MD order drawn via Peripheral Line 23 gauge needle inserted in left AC  Good blood return present. Procedure without incident.  Needle removed intact. Patient tolerated procedure well.

## 2012-05-26 NOTE — Progress Notes (Signed)
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 This was grade 3 with LV I ER +99% PR +100% HER-2/neu 3+ positive with a Ki-67 marker high at 45%. She participated in NSABP-41 randomized to Adriamycin and Cytoxan every 3 weeks for 4 cycles followed by paclitaxel and trastuzumab. She was unable to tolerate a trastuzumab due to the development of CHF. She a total of 7 treatments of the Taxol for developing severe weakness fatigue and grade 1 peripheral neuropathy. She still has minor peripheral neuropathy symptoms presently in the toes and rarely in the finger.  Problem #2 new development of pain in the and IP joints of the thumbs both hand. Labs are pending but I will get a consultation with Dr. Kellie Simmering in rheumatology  Problem #3 CHF and atrial fibrillation managed by Dr. Dietrich Pates problem #4 degenerative joint disease of the back status post disc surgery by Dr. Lovell Sheehan in the past.  The patient is here today with her husband and she has done well except for the above-mentioned joint complaints in particular. They are most likely osteoarthritis in nature but her right thumb is very swollen and PIP joints is also warm and tender. She has been on Coumadin for her heart issues. Her hands showed no nodularity and her wrists are negative. She has chronic right shoulder problem and chronic low back problems which are not new or different. She looks good overall and her vital signs are stable compared to 1 year ago.  She has no adenopathy no thyromegaly no masses in either breast. Her lungs are clear. Her heart shows an irregular rhythm but normal rate. I did not detect an S3 gallop. She has no leg edema or arm edema.  her abdomen was soft and nontender without organomegaly. Facial symmetry was intact and she is alert and very oriented.  We will get some blood work  but I will refer her to the rheumatologist in the very near future. I will see her in 6 months sooner if need be

## 2012-05-31 ENCOUNTER — Ambulatory Visit (INDEPENDENT_AMBULATORY_CARE_PROVIDER_SITE_OTHER): Payer: Medicare Other | Admitting: *Deleted

## 2012-05-31 DIAGNOSIS — Z7901 Long term (current) use of anticoagulants: Secondary | ICD-10-CM | POA: Diagnosis not present

## 2012-05-31 DIAGNOSIS — I4891 Unspecified atrial fibrillation: Secondary | ICD-10-CM | POA: Diagnosis not present

## 2012-06-01 ENCOUNTER — Other Ambulatory Visit (HOSPITAL_COMMUNITY): Payer: Self-pay

## 2012-06-01 DIAGNOSIS — M6281 Muscle weakness (generalized): Secondary | ICD-10-CM

## 2012-06-01 DIAGNOSIS — C50919 Malignant neoplasm of unspecified site of unspecified female breast: Secondary | ICD-10-CM

## 2012-06-08 DIAGNOSIS — M25549 Pain in joints of unspecified hand: Secondary | ICD-10-CM | POA: Diagnosis not present

## 2012-06-08 DIAGNOSIS — M67919 Unspecified disorder of synovium and tendon, unspecified shoulder: Secondary | ICD-10-CM | POA: Diagnosis not present

## 2012-06-08 DIAGNOSIS — M25569 Pain in unspecified knee: Secondary | ICD-10-CM | POA: Diagnosis not present

## 2012-06-08 DIAGNOSIS — M719 Bursopathy, unspecified: Secondary | ICD-10-CM | POA: Diagnosis not present

## 2012-06-08 DIAGNOSIS — Z79899 Other long term (current) drug therapy: Secondary | ICD-10-CM | POA: Diagnosis not present

## 2012-06-24 DIAGNOSIS — M67919 Unspecified disorder of synovium and tendon, unspecified shoulder: Secondary | ICD-10-CM | POA: Diagnosis not present

## 2012-06-24 DIAGNOSIS — M6530 Trigger finger, unspecified finger: Secondary | ICD-10-CM | POA: Diagnosis not present

## 2012-06-24 DIAGNOSIS — M719 Bursopathy, unspecified: Secondary | ICD-10-CM | POA: Diagnosis not present

## 2012-06-24 DIAGNOSIS — M25569 Pain in unspecified knee: Secondary | ICD-10-CM | POA: Diagnosis not present

## 2012-06-25 DIAGNOSIS — Z79899 Other long term (current) drug therapy: Secondary | ICD-10-CM | POA: Diagnosis not present

## 2012-06-25 DIAGNOSIS — M6530 Trigger finger, unspecified finger: Secondary | ICD-10-CM | POA: Diagnosis not present

## 2012-06-25 DIAGNOSIS — M25569 Pain in unspecified knee: Secondary | ICD-10-CM | POA: Diagnosis not present

## 2012-07-05 ENCOUNTER — Ambulatory Visit (INDEPENDENT_AMBULATORY_CARE_PROVIDER_SITE_OTHER): Payer: Medicare Other | Admitting: *Deleted

## 2012-07-05 DIAGNOSIS — Z7901 Long term (current) use of anticoagulants: Secondary | ICD-10-CM

## 2012-07-05 DIAGNOSIS — I4891 Unspecified atrial fibrillation: Secondary | ICD-10-CM | POA: Diagnosis not present

## 2012-07-05 LAB — POCT INR: INR: 2.9

## 2012-07-08 DIAGNOSIS — H538 Other visual disturbances: Secondary | ICD-10-CM | POA: Diagnosis not present

## 2012-07-08 DIAGNOSIS — H2510 Age-related nuclear cataract, unspecified eye: Secondary | ICD-10-CM | POA: Diagnosis not present

## 2012-07-12 ENCOUNTER — Ambulatory Visit (INDEPENDENT_AMBULATORY_CARE_PROVIDER_SITE_OTHER): Payer: Medicare Other | Admitting: *Deleted

## 2012-07-12 ENCOUNTER — Encounter: Payer: Self-pay | Admitting: Cardiology

## 2012-07-12 DIAGNOSIS — I4891 Unspecified atrial fibrillation: Secondary | ICD-10-CM

## 2012-07-12 DIAGNOSIS — Z7901 Long term (current) use of anticoagulants: Secondary | ICD-10-CM | POA: Diagnosis not present

## 2012-07-12 LAB — POCT INR: INR: 2.7

## 2012-07-13 DIAGNOSIS — M67919 Unspecified disorder of synovium and tendon, unspecified shoulder: Secondary | ICD-10-CM | POA: Diagnosis not present

## 2012-07-13 DIAGNOSIS — M19049 Primary osteoarthritis, unspecified hand: Secondary | ICD-10-CM | POA: Diagnosis not present

## 2012-07-13 DIAGNOSIS — M25569 Pain in unspecified knee: Secondary | ICD-10-CM | POA: Diagnosis not present

## 2012-07-29 ENCOUNTER — Ambulatory Visit (INDEPENDENT_AMBULATORY_CARE_PROVIDER_SITE_OTHER): Payer: Medicare Other | Admitting: *Deleted

## 2012-07-29 DIAGNOSIS — I4891 Unspecified atrial fibrillation: Secondary | ICD-10-CM

## 2012-07-29 DIAGNOSIS — Z7901 Long term (current) use of anticoagulants: Secondary | ICD-10-CM

## 2012-07-30 DIAGNOSIS — Z23 Encounter for immunization: Secondary | ICD-10-CM | POA: Diagnosis not present

## 2012-08-05 ENCOUNTER — Ambulatory Visit (INDEPENDENT_AMBULATORY_CARE_PROVIDER_SITE_OTHER): Payer: Medicare Other | Admitting: *Deleted

## 2012-08-05 DIAGNOSIS — D2330 Other benign neoplasm of skin of unspecified part of face: Secondary | ICD-10-CM | POA: Diagnosis not present

## 2012-08-05 DIAGNOSIS — I4891 Unspecified atrial fibrillation: Secondary | ICD-10-CM

## 2012-08-05 DIAGNOSIS — L819 Disorder of pigmentation, unspecified: Secondary | ICD-10-CM | POA: Diagnosis not present

## 2012-08-05 DIAGNOSIS — L821 Other seborrheic keratosis: Secondary | ICD-10-CM | POA: Diagnosis not present

## 2012-08-05 DIAGNOSIS — Z7901 Long term (current) use of anticoagulants: Secondary | ICD-10-CM | POA: Diagnosis not present

## 2012-08-05 DIAGNOSIS — L570 Actinic keratosis: Secondary | ICD-10-CM | POA: Diagnosis not present

## 2012-08-05 LAB — POCT INR: INR: 2.2

## 2012-08-09 DIAGNOSIS — M19049 Primary osteoarthritis, unspecified hand: Secondary | ICD-10-CM | POA: Diagnosis not present

## 2012-08-09 DIAGNOSIS — M25569 Pain in unspecified knee: Secondary | ICD-10-CM | POA: Diagnosis not present

## 2012-08-09 DIAGNOSIS — M67919 Unspecified disorder of synovium and tendon, unspecified shoulder: Secondary | ICD-10-CM | POA: Diagnosis not present

## 2012-08-09 DIAGNOSIS — M25549 Pain in joints of unspecified hand: Secondary | ICD-10-CM | POA: Diagnosis not present

## 2012-08-09 DIAGNOSIS — M719 Bursopathy, unspecified: Secondary | ICD-10-CM | POA: Diagnosis not present

## 2012-08-11 ENCOUNTER — Other Ambulatory Visit: Payer: Self-pay | Admitting: *Deleted

## 2012-08-11 ENCOUNTER — Ambulatory Visit (INDEPENDENT_AMBULATORY_CARE_PROVIDER_SITE_OTHER): Payer: Medicare Other | Admitting: *Deleted

## 2012-08-11 DIAGNOSIS — I4891 Unspecified atrial fibrillation: Secondary | ICD-10-CM

## 2012-08-11 DIAGNOSIS — Z7901 Long term (current) use of anticoagulants: Secondary | ICD-10-CM | POA: Diagnosis not present

## 2012-08-11 LAB — POCT INR: INR: 1.3

## 2012-08-25 ENCOUNTER — Ambulatory Visit (INDEPENDENT_AMBULATORY_CARE_PROVIDER_SITE_OTHER): Payer: Medicare Other | Admitting: *Deleted

## 2012-08-25 DIAGNOSIS — Z7901 Long term (current) use of anticoagulants: Secondary | ICD-10-CM | POA: Diagnosis not present

## 2012-08-25 DIAGNOSIS — I4891 Unspecified atrial fibrillation: Secondary | ICD-10-CM | POA: Diagnosis not present

## 2012-09-02 ENCOUNTER — Telehealth: Payer: Self-pay | Admitting: Cardiology

## 2012-09-02 ENCOUNTER — Ambulatory Visit (INDEPENDENT_AMBULATORY_CARE_PROVIDER_SITE_OTHER): Payer: Medicare Other | Admitting: *Deleted

## 2012-09-02 DIAGNOSIS — I4891 Unspecified atrial fibrillation: Secondary | ICD-10-CM | POA: Diagnosis not present

## 2012-09-02 DIAGNOSIS — Z7901 Long term (current) use of anticoagulants: Secondary | ICD-10-CM

## 2012-09-02 NOTE — Telephone Encounter (Signed)
Please call patient back at above number. / tg

## 2012-09-02 NOTE — Telephone Encounter (Signed)
See coumadin note. 

## 2012-09-09 ENCOUNTER — Ambulatory Visit (INDEPENDENT_AMBULATORY_CARE_PROVIDER_SITE_OTHER): Payer: Medicare Other | Admitting: *Deleted

## 2012-09-09 DIAGNOSIS — I4891 Unspecified atrial fibrillation: Secondary | ICD-10-CM

## 2012-09-09 DIAGNOSIS — Z7901 Long term (current) use of anticoagulants: Secondary | ICD-10-CM

## 2012-09-20 ENCOUNTER — Ambulatory Visit (HOSPITAL_COMMUNITY)
Admission: RE | Admit: 2012-09-20 | Discharge: 2012-09-20 | Disposition: A | Discharge status: Home / Self Care | Payer: Medicare Other | Source: Ambulatory Visit | Attending: Cardiology | Admitting: Cardiology

## 2012-09-20 ENCOUNTER — Ambulatory Visit (INDEPENDENT_AMBULATORY_CARE_PROVIDER_SITE_OTHER): Payer: Medicare Other | Admitting: *Deleted

## 2012-09-20 ENCOUNTER — Other Ambulatory Visit: Payer: Self-pay | Admitting: Cardiology

## 2012-09-20 DIAGNOSIS — R609 Edema, unspecified: Secondary | ICD-10-CM | POA: Diagnosis not present

## 2012-09-20 DIAGNOSIS — I82409 Acute embolism and thrombosis of unspecified deep veins of unspecified lower extremity: Secondary | ICD-10-CM

## 2012-09-20 DIAGNOSIS — M7120 Synovial cyst of popliteal space [Baker], unspecified knee: Secondary | ICD-10-CM | POA: Diagnosis not present

## 2012-09-20 DIAGNOSIS — I4891 Unspecified atrial fibrillation: Secondary | ICD-10-CM | POA: Diagnosis not present

## 2012-09-20 DIAGNOSIS — M79609 Pain in unspecified limb: Secondary | ICD-10-CM | POA: Diagnosis not present

## 2012-09-20 DIAGNOSIS — Z7901 Long term (current) use of anticoagulants: Secondary | ICD-10-CM

## 2012-09-24 ENCOUNTER — Ambulatory Visit: Payer: Medicare Other | Admitting: Cardiology

## 2012-09-27 ENCOUNTER — Encounter (HOSPITAL_COMMUNITY): Payer: Medicare Other | Attending: Oncology

## 2012-09-27 DIAGNOSIS — R1900 Intra-abdominal and pelvic swelling, mass and lump, unspecified site: Secondary | ICD-10-CM | POA: Insufficient documentation

## 2012-09-27 DIAGNOSIS — I4891 Unspecified atrial fibrillation: Secondary | ICD-10-CM | POA: Insufficient documentation

## 2012-09-27 DIAGNOSIS — I509 Heart failure, unspecified: Secondary | ICD-10-CM | POA: Insufficient documentation

## 2012-09-27 DIAGNOSIS — Z09 Encounter for follow-up examination after completed treatment for conditions other than malignant neoplasm: Secondary | ICD-10-CM | POA: Diagnosis not present

## 2012-09-27 DIAGNOSIS — C50919 Malignant neoplasm of unspecified site of unspecified female breast: Secondary | ICD-10-CM | POA: Diagnosis not present

## 2012-09-27 DIAGNOSIS — Z853 Personal history of malignant neoplasm of breast: Secondary | ICD-10-CM | POA: Insufficient documentation

## 2012-09-27 DIAGNOSIS — M6281 Muscle weakness (generalized): Secondary | ICD-10-CM

## 2012-09-27 NOTE — Progress Notes (Signed)
Labs drawn today for Homocysteine

## 2012-09-28 ENCOUNTER — Ambulatory Visit (INDEPENDENT_AMBULATORY_CARE_PROVIDER_SITE_OTHER): Payer: Medicare Other | Admitting: Cardiology

## 2012-09-28 ENCOUNTER — Encounter: Payer: Self-pay | Admitting: Cardiology

## 2012-09-28 VITALS — BP 130/80 | HR 60 | Ht 61.0 in | Wt 121.0 lb

## 2012-09-28 DIAGNOSIS — Z7901 Long term (current) use of anticoagulants: Secondary | ICD-10-CM | POA: Diagnosis not present

## 2012-09-28 DIAGNOSIS — R7301 Impaired fasting glucose: Secondary | ICD-10-CM

## 2012-09-28 DIAGNOSIS — M6281 Muscle weakness (generalized): Secondary | ICD-10-CM

## 2012-09-28 DIAGNOSIS — N181 Chronic kidney disease, stage 1: Secondary | ICD-10-CM

## 2012-09-28 DIAGNOSIS — I4891 Unspecified atrial fibrillation: Secondary | ICD-10-CM

## 2012-09-28 DIAGNOSIS — I10 Essential (primary) hypertension: Secondary | ICD-10-CM

## 2012-09-28 NOTE — Assessment & Plan Note (Signed)
All blood pressures recorded since 03/2012 have been normal.

## 2012-09-28 NOTE — Assessment & Plan Note (Signed)
No evidence for occult GI blood loss.  Patient continues to do well with full anticoagulation with warfarin, which she has tolerated for many years.

## 2012-09-28 NOTE — Progress Notes (Signed)
Patient ID: ANIELLE STARLING, female   DOB: 01-23-1932, 78 y.o.   MRN: [id number]  HPI: Scheduled return visit for this lovely woman with sick sinus syndrome, permanent atrial fibrillation and chemotherapy-induced cardiomyopathy, which has improved substantially.  Recently, she has exercised up to one half hour on a treadmill, generally without difficulty.  She has noted some myalgias and arthralgias, particularly in the fingers, right shoulder and right knee.  She developed a rash consistent with Lyme disease while in [facility] some months ago and was treated with a course of tetracycline.  She requires right cataract extraction, but has been told she has substantial macular degeneration in that eye.  Vision in the left eye is apparently good.  She was recently treated with a course of prednisone, which resulted in hyperglycemia, boundless energy and relief of all musculoskeletal symptoms.  Approximately one week ago, patient developed pain and swelling in her left leg.  Venous ultrasound was negative except for a small left popliteal cyst.  Anticoagulation became somewhat unstable while patient was treated with antibiotics and prednisone.  Most recent INR has been in the therapeutic range.  Prior to Admission medications   Medication Sig Start Date End Date Taking? Authorizing Provider  cholecalciferol (VITAMIN D) 1000 UNITS tablet Take 1,000 Units by mouth daily.     Yes Historical Provider, MD  Coenzyme Q10 (CO Q-10) 300 MG CAPS Take 300 mg by mouth daily.     Yes Historical Provider, MD  DIOVAN 80 MG tablet TAKE ONE TABLET DAILY. 04/14/11  Yes Kathlen Brunswick, MD  furosemide (LASIX) 20 MG tablet Take 20-40 mg by mouth as needed. For fluid   Yes Historical Provider, MD  glucosamine-chondroitin 500-400 MG tablet Take 1 tablet by mouth 2 (two) times daily.     Yes Historical Provider, MD  LANOXIN 0.25 MG tablet TAKE (1) TABLET BY MOUTH ON MONDAY, WED, AND FRI., 1/2 TABLET ON ALL OTHER DAYS.  12/27/11  Yes Kathlen Brunswick, MD  metoprolol succinate (TOPROL-XL) 25 MG 24 hr tablet Take 25 mg by mouth daily.     Yes Historical Provider, MD  Multiple Vitamin (MULITIVITAMIN WITH MINERALS) TABS Take 1 tablet by mouth daily.   Yes Historical Provider, MD  oxyCODONE-acetaminophen (PERCOCET) 5-325 MG per tablet Use 1/2 - 1 PO Q 4 hours as needed for pain 03/29/12  Yes Nicoletta Dress. Colon Branch, MD  potassium chloride SA (KLOR-CON M20) 20 MEQ tablet Take 20 mEq by mouth as needed. For potassium replacement. Only takes when taking Lasix   Yes Historical Provider, MD  tamoxifen (NOLVADEX) 20 MG tablet Take 20 mg by mouth daily.     Yes Historical Provider, MD  traMADol (ULTRAM) 50 MG tablet Take 50 mg by mouth daily as needed. Only takes if necessary   Yes Historical Provider, MD  warfarin (COUMADIN) 5 MG tablet Take 5 mg by mouth daily.  11/12/11  Yes Kathlen Brunswick, MD  zolpidem (AMBIEN) 5 MG tablet Take 2.5 mg by mouth at bedtime as needed.    Yes Historical Provider, MD   Allergies  Allergen Reactions  . Altace (Ramipril) Rash     Past medical history, social history, and family history reviewed and updated.  ROS: Denies orthopnea, PND, palpitations, lightheadedness or syncope.  She has had some pedal edema and intermittently uses furosemide under the direction of her husband.  All other systems reviewed and are negative.  PHYSICAL EXAM: BP 130/80  Pulse 60  Ht 5\' 1"  (1.549 m)  Wt 54.885 kg (121 lb)  BMI 22.86 kg/m2 General-Well developed; no acute distress Body habitus-proportionate weight and height Neck-No JVD; no carotid bruits Lungs-clear lung fields; resonant to percussion Cardiovascular-normal PMI; Split S1 and normal S2; irregular rhythm Abdomen-normal bowel sounds; soft and non-tender without masses or organomegaly Musculoskeletal-No deformities, no cyanosis or clubbing Neurologic-Normal cranial nerves; symmetric strength and tone Skin-Warm, no significant  lesions Extremities-distal pulses intact; no edema  Rhythm Strip: Atrial fibrillation; ventricular rate of 55 bpm  ASSESSMENT AND PLAN:  Chandler Bing, MD 09/28/2012 4:38 PM

## 2012-09-28 NOTE — Progress Notes (Deleted)
Name: Alicia Harrington    DOB: 1932-04-16  Age: 78 y.o.  MR#: [id number]       PCP:  [place] Bing, MD      Insurance: @PAYORNAME @   CC:   No chief complaint on file.   VS BP 130/80  Pulse 60  Ht 5\' 1"  (1.549 m)  Wt 121 lb (54.885 kg)  BMI 22.86 kg/m2  Weights Current Weight  09/28/12 121 lb (54.885 kg)  05/26/12 119 lb 6.4 oz (54.159 kg)  04/02/12 115 lb (52.164 kg)    Blood Pressure  BP Readings from Last 3 Encounters:  09/28/12 130/80  05/26/12 128/74  04/02/12 78/62     Admit date:  (Not on file) Last encounter with RMR:  09/20/2012   Allergy Allergies  Allergen Reactions  . Altace (Ramipril) Rash    Current Outpatient Prescriptions  Medication Sig Dispense Refill  . cholecalciferol (VITAMIN D) 1000 UNITS tablet Take 1,000 Units by mouth daily.        . Coenzyme Q10 (CO Q-10) 300 MG CAPS Take 300 mg by mouth daily.        Marland Kitchen DIOVAN 80 MG tablet TAKE ONE TABLET DAILY.  90 each  1  . furosemide (LASIX) 20 MG tablet Take 20-40 mg by mouth as needed. For fluid      . glucosamine-chondroitin 500-400 MG tablet Take 1 tablet by mouth 2 (two) times daily.        Marland Kitchen LANOXIN 0.25 MG tablet TAKE (1) TABLET BY MOUTH ON MONDAY, WED, AND FRI., 1/2 TABLET ON ALL OTHER DAYS.  30 each  6  . metoprolol succinate (TOPROL-XL) 25 MG 24 hr tablet Take 25 mg by mouth daily.        . Multiple Vitamin (MULITIVITAMIN WITH MINERALS) TABS Take 1 tablet by mouth daily.      Marland Kitchen oxyCODONE-acetaminophen (PERCOCET) 5-325 MG per tablet Use 1/2 - 1 PO Q 4 hours as needed for pain  20 tablet  0  . potassium chloride SA (KLOR-CON M20) 20 MEQ tablet Take 20 mEq by mouth as needed. For potassium replacement. Only takes when taking Lasix      . tamoxifen (NOLVADEX) 20 MG tablet Take 20 mg by mouth daily.        . traMADol (ULTRAM) 50 MG tablet Take 50 mg by mouth daily as needed. Only takes if necessary      . warfarin (COUMADIN) 5 MG tablet Take 5 mg by mouth daily.       Marland Kitchen zolpidem (AMBIEN) 5 MG  tablet Take 2.5 mg by mouth at bedtime as needed.         Discontinued Meds:   There are no discontinued medications.  Patient Active Problem List  Diagnosis  . HEARING IMPAIRMENT  . HYPERTENSION  . Campath-induced atrial fibrillation  . Chronic anticoagulation  . Cardiomyopathy  . Sick sinus syndrome  . Chronic kidney disease (CKD), stage I  . Adenocarcinoma, breast  . DJD (degenerative joint disease)  . History of angina  . Macular degeneration, dry  . Pain in joint, shoulder region  . Muscle weakness (generalized)  . Bone spur  . Fasting hyperglycemia  . Orthostatic hypotension    LABS Anti-coag visit on 09/20/2012  Component Date Value  . INR 09/20/2012 2.4   Anti-coag visit on 09/09/2012  Component Date Value  . INR 09/09/2012 3.3*  Anti-coag visit on 09/02/2012  Component Date Value  . INR 09/02/2012 2.8*  Anti-coag visit on 08/25/2012  Component  Date Value  . INR 08/25/2012 1.6*  Anti-coag visit on 08/11/2012  Component Date Value  . INR 08/11/2012 1.3   Anti-coag visit on 08/05/2012  Component Date Value  . INR 08/05/2012 2.2   Anti-coag visit on 07/29/2012  Component Date Value  . INR 07/29/2012 2.9   Anti-coag visit on 07/12/2012  Component Date Value  . INR 07/12/2012 2.7   Anti-coag visit on 07/05/2012  Component Date Value  . INR 07/05/2012 2.9      Results for this Opt Visit:     Results for orders placed in visit on 09/20/12  POCT INR      Component Value Range   INR 2.4      EKG Orders placed during the hospital encounter of 03/29/12  . ED EKG  . ED EKG  . EKG 12-LEAD  . EKG 12-LEAD  . EKG     Prior Assessment and Plan Problem List as of 09/28/2012            Cardiology Problems   HYPERTENSION   Last Assessment & Plan Note   09/22/2011 Office Visit Signed 09/22/2011 11:56 AM by Kathlen Brunswick, MD    Blood pressure control is adequate.  Tetanus toxoid injection has been ordered.    Campath-induced atrial fibrillation     Last Assessment & Plan Note   04/02/2012 Office Visit Signed 04/02/2012 12:23 PM by Kathlen Brunswick, MD    Chronic atrial fibrillation persists.  Patient was evaluated by Dr. Johney Frame with mutual agreement that she should continue current therapy.  Anticoagulation has been stable and therapeutic including as measured in the emergency department a few days ago.    Cardiomyopathy   Last Assessment & Plan Note   04/02/2012 Office Visit Signed 04/02/2012 12:24 PM by Kathlen Brunswick, MD    CHF is now compensated, and recovery of left ventricular systolic function was previously documented.  Current therapy will be continued.    Sick sinus syndrome   Orthostatic hypotension   Last Assessment & Plan Note   04/02/2012 Office Visit Addendum 04/02/2012 12:28 PM by Kathlen Brunswick, MD    Patient is significantly orthostatic with typical orthostatic symptoms.  Etiology is likely dehydration related to recent GI illness.  The potential occurrence and danger of syncope were discussed with her and her husband.  She is advised to increase fluid and salt intake and not to walk unassisted until standing blood pressure is greater than 100.  Valsartan and diuretic will be held for a few days until symptoms have resolved and blood pressure has recovered.  Patient requested to call for additional problems.  Otherwise, I will see her again in 6 months.      Other   HEARING IMPAIRMENT   Chronic anticoagulation   Last Assessment & Plan Note   04/02/2012 Office Visit Signed 04/02/2012 12:31 PM by Kathlen Brunswick, MD    Stool recently Hemoccult negative x3.  Colonoscopy in 01/2012 revealed 2 benign polyps.    Chronic kidney disease (CKD), stage I   Last Assessment & Plan Note   09/22/2011 Office Visit Signed 09/22/2011 11:55 AM by Kathlen Brunswick, MD    Creatinine most recently 0.84 in 03/2011; renal function is normal for age.    Adenocarcinoma, breast   Last Assessment & Plan Note   09/22/2011 Office Visit  Signed 09/22/2011 11:52 AM by Kathlen Brunswick, MD    Patient remains free of apparent recurrence and is followed closely by Dr.  Neijstrom.    DJD (degenerative joint disease)   Last Assessment & Plan Note   09/22/2011 Office Visit Addendum 09/24/2011  4:14 PM by Kathlen Brunswick, MD    Ultracet 1-2 tabs 3 times a day as needed prescribed for days when she is unable to tolerate Celebrex.  She will discuss with Dr. Karilyn Cota whether he can suggest additional evaluation or treatment that will help her to tolerate nonsteroidals.    History of angina   Macular degeneration, dry   Pain in joint, shoulder region   Last Assessment & Plan Note   04/02/2012 Office Visit Signed 04/02/2012 12:26 PM by Kathlen Brunswick, MD    Patient reports arthralgias and myalgias, which are chronic.  She has tried to discontinue a number of her medications including tamoxifen, but symptoms did not resolve.  She will continue to treat these symptomatically.    Muscle weakness (generalized)   Bone spur   Fasting hyperglycemia       Imaging: US Venous Img Lower Bilateral  09/20/2012  *RADIOLOGY REPORT*  Clinical Data: Left leg pain  VENOUS DUPLEX ULTRASOUND OF BILATERAL LOWER EXTREMITIES  Technique:  Gray-scale sonography with graded compression, as well as color Doppler and duplex ultrasound, were performed to evaluate the deep venous system of both lower extremities from the level of the common femoral vein through the popliteal and proximal calf veins.  Spectral Doppler was utilized to evaluate flow at rest and with distal augmentation maneuvers.  Comparison:  None.  Findings: The visualized bilateral lower extremity deep venous systems appear patent.  Normal compressibility.  Patent color Doppler flow.  Satisfactory spectral Doppler with respiratory variation and response to augmentation.  The greater saphenous vein, where visualized, is patent and compressible.  2.0 x 1.0 cm left popliteal fossa cyst.  IMPRESSION: No  deep venous thrombosis in the visualized bilateral lower extremities.  2.0 cm left popliteal fossa cyst.   Original Report Authenticated By: Charline Bills, M.D.      [facility] Calculation: Score not calculated. Missing: Total Cholesterol

## 2012-09-28 NOTE — Assessment & Plan Note (Addendum)
Ventricular rate in atrial fibrillation is on the slow side, but patient is asymptomatic, and no adjustment in AV nodal blocking agents as necessary.

## 2012-09-28 NOTE — Assessment & Plan Note (Signed)
Strength in lower extremities is normal.  No muscle tenderness nor evidence for myopathy.  Recent normal ESR.  Doubt significant chronic neuromuscular disease-symptomatic treatment recommended.

## 2012-09-28 NOTE — Assessment & Plan Note (Signed)
On diuretic with creatinine of 1.6; subsequently normal, 0.9 in 03/2012

## 2012-10-04 ENCOUNTER — Encounter (HOSPITAL_COMMUNITY): Payer: Self-pay | Admitting: Pharmacy Technician

## 2012-10-04 ENCOUNTER — Ambulatory Visit (INDEPENDENT_AMBULATORY_CARE_PROVIDER_SITE_OTHER): Payer: Medicare Other | Admitting: *Deleted

## 2012-10-04 DIAGNOSIS — I4891 Unspecified atrial fibrillation: Secondary | ICD-10-CM

## 2012-10-04 DIAGNOSIS — Z7901 Long term (current) use of anticoagulants: Secondary | ICD-10-CM

## 2012-10-04 NOTE — Patient Instructions (Addendum)
Your procedure is scheduled on: 10/11/2012   Report to [facility] at 6:15     AM.  Call this number if you have problems the morning of surgery: [phone]   Remember:   Do not eat or drink :After Midnight.    Take these medicines the morning of surgery with A SIP OF WATER: Diovan, Lanoxin, and Metoprolol   Do not wear jewelry, make-up or nail polish.  Do not wear lotions, powders, or perfumes. You may wear deodorant.  Do not shave 48 hours prior to surgery.  Do not bring valuables to the hospital.  Contacts, dentures or bridgework may not be worn into surgery.  Patients discharged the day of surgery will not be allowed to drive home.  Name and phone number of your driver:    Please read over the following fact sheets that you were given: Pain Booklet, Surgical Site Infection Prevention, Anesthesia Post-op Instructions and Care and Recovery After Surgery  Cataract Surgery  A cataract is a clouding of the lens of the eye. When a lens becomes cloudy, vision is reduced based on the degree and nature of the clouding. Surgery may be needed to improve vision. Surgery removes the cloudy lens and usually replaces it with a substitute lens (intraocular lens, IOL). LET YOUR EYE DOCTOR KNOW ABOUT:  Allergies to food or medicine.   Medicines taken including herbs, eyedrops, over-the-counter medicines, and creams.   Use of steroids (by mouth or creams).   Previous problems with anesthetics or numbing medicine.   History of bleeding problems or blood clots.   Previous surgery.   Other health problems, including diabetes and kidney problems.   Possibility of pregnancy, if this applies.  RISKS AND COMPLICATIONS  Infection.   Inflammation of the eyeball (endophthalmitis) that can spread to both eyes (sympathetic ophthalmia).   Poor wound healing.   If an IOL is inserted, it can later fall out of proper position. This is very uncommon.   Clouding of the part of your eye that holds an  IOL in place. This is called an "after-cataract." These are uncommon, but easily treated.  BEFORE THE PROCEDURE  Do not eat or drink anything except small amounts of water for 8 to 12 before your surgery, or as directed by your caregiver.   Unless you are told otherwise, continue any eyedrops you have been prescribed.   Talk to your primary caregiver about all other medicines that you take (both prescription and non-prescription). In some cases, you may need to stop or change medicines near the time of your surgery. This is most important if you are taking blood-thinning medicine.Do not stop medicines unless you are told to do so.   Arrange for someone to drive you to and from the procedure.   Do not put contact lenses in either eye on the day of your surgery.  PROCEDURE There is more than one method for safely removing a cataract. Your doctor can explain the differences and help determine which is best for you. Phacoemulsification surgery is the most common form of cataract surgery.  An injection is given behind the eye or eyedrops are given to make this a painless procedure.   A small cut (incision) is made on the edge of the clear, dome-shaped surface that covers the front of the eye (cornea).   A tiny probe is painlessly inserted into the eye. This device gives off ultrasound waves that soften and break up the cloudy center of the lens. This makes  it easier for the cloudy lens to be removed by suction.   An IOL may be implanted.   The normal lens of the eye is covered by a clear capsule. Part of that capsule is intentionally left in the eye to support the IOL.   Your surgeon may or may not use stitches to close the incision.  There are other forms of cataract surgery that require a larger incision and stiches to close the eye. This approach is taken in cases where the doctor feels that the cataract cannot be easily removed using phacoemulsification. AFTER THE PROCEDURE  When an  IOL is implanted, it does not need care. It becomes a permanent part of your eye and cannot be seen or felt.   Your doctor will schedule follow-up exams to check on your progress.   Review your other medicines with your doctor to see which can be resumed after surgery.   Use eyedrops or take medicine as prescribed by your doctor.  Document Released: 10/30/2011 Document Reviewed: 10/27/2011 [facility] Patient Information 2012 [place].  .Cataract Surgery Care After Refer to this sheet in the next few weeks. These instructions provide you with information on caring for yourself after your procedure. Your caregiver may also give you more specific instructions. Your treatment has been planned according to current medical practices, but problems sometimes occur. Call your caregiver if you have any problems or questions after your procedure.  HOME CARE INSTRUCTIONS   Avoid strenuous activities as directed by your caregiver.   Ask your caregiver when you can resume driving.   Use eyedrops or other medicines to help healing and control pressure inside your eye as directed by your caregiver.   Only take over-the-counter or prescription medicines for pain, discomfort, or fever as directed by your caregiver.   Do not to touch or rub your eyes.   You may be instructed to use a protective shield during the first few days and nights after surgery. If not, wear sunglasses to protect your eyes. This is to protect the eye from pressure or from being accidentally bumped.   Keep the area around your eye clean and dry. Avoid swimming or allowing water to hit you directly in the face while showering. Keep soap and shampoo out of your eyes.   Do not bend or lift heavy objects. Bending increases pressure in the eye. You can walk, climb stairs, and do light household chores.   Do not put a contact lens into the eye that had surgery until your caregiver says it is okay to do so.   Ask your doctor when  you can return to work. This will depend on the kind of work that you do. If you work in a dusty environment, you may be advised to wear protective eyewear for a period of time.   Ask your caregiver when it will be safe to engage in sexual activity.   Continue with your regular eye exams as directed by your caregiver.  What to expect:  It is normal to feel itching and mild discomfort for a few days after cataract surgery. Some fluid discharge is also common, and your eye may be sensitive to light and touch.   After 1 to 2 days, even moderate discomfort should disappear. In most cases, healing will take about 6 weeks.   If you received an intraocular lens (IOL), you may notice that colors are very bright or have a blue tinge. Also, if you have been in bright sunlight,  everything may appear reddish for a few hours. If you see these color tinges, it is because your lens is clear and no longer cloudy. Within a few months after receiving an IOL, these extra colors should go away. When you have healed, you will probably need new glasses.  SEEK MEDICAL CARE IF:   You have increased bruising around your eye.   You have discomfort not helped by medicine.  SEEK IMMEDIATE MEDICAL CARE IF:   You have a fever.   You have a worsening or sudden vision loss.   You have redness, swelling, or increasing pain in the eye.   You have a thick discharge from the eye that had surgery.  MAKE SURE YOU:  Understand these instructions.   Will watch your condition.   Will get help right away if you are not doing well or get worse.  Document Released: 05/30/2005 Document Revised: 10/30/2011 Document Reviewed: 07/04/2011 [facility] Patient Information 2012 [place], [place].

## 2012-10-05 ENCOUNTER — Encounter (HOSPITAL_COMMUNITY): Payer: Self-pay

## 2012-10-05 ENCOUNTER — Encounter (HOSPITAL_COMMUNITY)
Admission: RE | Admit: 2012-10-05 | Discharge: 2012-10-05 | Disposition: A | Discharge status: Home / Self Care | Payer: Medicare Other | Source: Ambulatory Visit | Attending: Ophthalmology | Admitting: Ophthalmology

## 2012-10-05 DIAGNOSIS — I4891 Unspecified atrial fibrillation: Secondary | ICD-10-CM | POA: Diagnosis not present

## 2012-10-05 DIAGNOSIS — Z01812 Encounter for preprocedural laboratory examination: Secondary | ICD-10-CM | POA: Diagnosis not present

## 2012-10-05 DIAGNOSIS — I10 Essential (primary) hypertension: Secondary | ICD-10-CM | POA: Diagnosis not present

## 2012-10-05 DIAGNOSIS — H2510 Age-related nuclear cataract, unspecified eye: Secondary | ICD-10-CM | POA: Diagnosis not present

## 2012-10-05 DIAGNOSIS — I509 Heart failure, unspecified: Secondary | ICD-10-CM | POA: Diagnosis not present

## 2012-10-05 DIAGNOSIS — H538 Other visual disturbances: Secondary | ICD-10-CM | POA: Diagnosis not present

## 2012-10-05 HISTORY — DX: Ataxia, unspecified: R27.0

## 2012-10-05 HISTORY — DX: Polyneuropathy, unspecified: G62.9

## 2012-10-05 LAB — CBC
MCH: 35.2 pg — ABNORMAL HIGH (ref 26.0–34.0)
Platelets: 172 10*3/uL (ref 150–400)
RBC: 3.69 MIL/uL — ABNORMAL LOW (ref 3.87–5.11)

## 2012-10-05 LAB — BASIC METABOLIC PANEL
Calcium: 9.9 mg/dL (ref 8.4–10.5)
GFR calc non Af Amer: 57 mL/min — ABNORMAL LOW (ref >90)
Glucose, Bld: 97 mg/dL (ref 70–99)
Sodium: 140 mEq/L (ref 135–145)

## 2012-10-05 MED ORDER — CYCLOPENTOLATE-PHENYLEPHRINE 0.2-1 % OP SOLN
OPHTHALMIC | Status: AC
  Filled 2012-10-05: qty 2

## 2012-10-11 ENCOUNTER — Encounter (HOSPITAL_COMMUNITY)
Admission: RE | Disposition: A | Discharge status: Home / Self Care | Payer: Self-pay | Source: Ambulatory Visit | Attending: Ophthalmology

## 2012-10-11 ENCOUNTER — Encounter (HOSPITAL_COMMUNITY): Payer: Self-pay | Admitting: Anesthesiology

## 2012-10-11 ENCOUNTER — Ambulatory Visit (HOSPITAL_COMMUNITY): Payer: Medicare Other | Admitting: Anesthesiology

## 2012-10-11 ENCOUNTER — Encounter (HOSPITAL_COMMUNITY): Payer: Self-pay | Admitting: *Deleted

## 2012-10-11 ENCOUNTER — Ambulatory Visit (HOSPITAL_COMMUNITY)
Admission: RE | Admit: 2012-10-11 | Discharge: 2012-10-11 | Disposition: A | Discharge status: Home / Self Care | Payer: Medicare Other | Source: Ambulatory Visit | Attending: Ophthalmology | Admitting: Ophthalmology

## 2012-10-11 DIAGNOSIS — I4891 Unspecified atrial fibrillation: Secondary | ICD-10-CM | POA: Insufficient documentation

## 2012-10-11 DIAGNOSIS — I10 Essential (primary) hypertension: Secondary | ICD-10-CM | POA: Diagnosis not present

## 2012-10-11 DIAGNOSIS — Z01812 Encounter for preprocedural laboratory examination: Secondary | ICD-10-CM | POA: Insufficient documentation

## 2012-10-11 DIAGNOSIS — H2510 Age-related nuclear cataract, unspecified eye: Secondary | ICD-10-CM | POA: Diagnosis not present

## 2012-10-11 DIAGNOSIS — I509 Heart failure, unspecified: Secondary | ICD-10-CM | POA: Insufficient documentation

## 2012-10-11 DIAGNOSIS — H538 Other visual disturbances: Secondary | ICD-10-CM | POA: Diagnosis not present

## 2012-10-11 DIAGNOSIS — H269 Unspecified cataract: Secondary | ICD-10-CM | POA: Diagnosis not present

## 2012-10-11 HISTORY — PX: CATARACT EXTRACTION W/PHACO: SHX586

## 2012-10-11 SURGERY — PHACOEMULSIFICATION, CATARACT, WITH IOL INSERTION
Anesthesia: Monitor Anesthesia Care | Site: Eye | Laterality: Right | Wound class: Clean

## 2012-10-11 MED ORDER — TETRACAINE 0.5 % OP SOLN OPTIME - NO CHARGE
OPHTHALMIC | Status: DC | PRN
  Administered 2012-10-11: 1 [drp] via OPHTHALMIC

## 2012-10-11 MED ORDER — BSS IO SOLN
INTRAOCULAR | Status: DC | PRN
  Administered 2012-10-11: 15 mL via INTRAOCULAR

## 2012-10-11 MED ORDER — NA HYALUR & NA CHOND-NA HYALUR 0.55-0.5 ML IO KIT
PACK | INTRAOCULAR | Status: DC | PRN
  Administered 2012-10-11: 1 via OPHTHALMIC

## 2012-10-11 MED ORDER — EPINEPHRINE HCL 1 MG/ML IJ SOLN
INTRAMUSCULAR | Status: AC
  Filled 2012-10-11: qty 1

## 2012-10-11 MED ORDER — KETOROLAC TROMETHAMINE 0.4 % OP SOLN - NO CHARGE
1.0000 [drp] | Freq: Once | OPHTHALMIC | Status: AC
  Administered 2012-10-11: 1 [drp] via OPHTHALMIC
  Filled 2012-10-11: qty 5

## 2012-10-11 MED ORDER — MOXIFLOXACIN HCL 0.5 % OP SOLN - NO CHARGE
1.0000 [drp] | Freq: Once | OPHTHALMIC | Status: AC
  Administered 2012-10-11: 1 [drp] via OPHTHALMIC
  Filled 2012-10-11: qty 3

## 2012-10-11 MED ORDER — GATIFLOXACIN 0.5 % OP SOLN OPTIME - NO CHARGE
OPHTHALMIC | Status: DC | PRN
  Administered 2012-10-11: 1 [drp] via OPHTHALMIC

## 2012-10-11 MED ORDER — LIDOCAINE HCL 3.5 % OP GEL
OPHTHALMIC | Status: AC
  Filled 2012-10-11: qty 5

## 2012-10-11 MED ORDER — TETRACAINE HCL 0.5 % OP SOLN
OPHTHALMIC | Status: AC
  Filled 2012-10-11: qty 2

## 2012-10-11 MED ORDER — LIDOCAINE 3.5 % OP GEL OPTIME - NO CHARGE
OPHTHALMIC | Status: DC | PRN
  Administered 2012-10-11: 1 [drp] via OPHTHALMIC

## 2012-10-11 MED ORDER — EPINEPHRINE HCL 1 MG/ML IJ SOLN
INTRAOCULAR | Status: DC | PRN
  Administered 2012-10-11: 08:00:00

## 2012-10-11 MED ORDER — LACTATED RINGERS IV SOLN
INTRAVENOUS | Status: DC
  Administered 2012-10-11: 1000 mL via INTRAVENOUS

## 2012-10-11 MED ORDER — MIDAZOLAM HCL 2 MG/2ML IJ SOLN
INTRAMUSCULAR | Status: AC
  Filled 2012-10-11: qty 2

## 2012-10-11 MED ORDER — CYCLOPENTOLATE-PHENYLEPHRINE 0.2-1 % OP SOLN
1.0000 [drp] | Freq: Once | OPHTHALMIC | Status: AC
  Administered 2012-10-11: 1 [drp] via OPHTHALMIC

## 2012-10-11 MED ORDER — GATIFLOXACIN 0.5 % OP SOLN OPTIME - NO CHARGE
1.0000 [drp] | Freq: Once | OPHTHALMIC | Status: AC
  Administered 2012-10-11: 1 [drp] via OPHTHALMIC
  Filled 2012-10-11: qty 2.5

## 2012-10-11 MED ORDER — MIDAZOLAM HCL 2 MG/2ML IJ SOLN
1.0000 mg | INTRAMUSCULAR | Status: DC | PRN
  Administered 2012-10-11: 2 mg via INTRAVENOUS

## 2012-10-11 SURGICAL SUPPLY — 29 items
CAPSULAR TENSION RING-AMO (OPHTHALMIC RELATED) IMPLANT
CLOTH BEACON ORANGE TIMEOUT ST (SAFETY) ×2 IMPLANT
GLOVE BIO SURGEON STRL SZ7.5 (GLOVE) IMPLANT
GLOVE BIOGEL M 6.5 STRL (GLOVE) IMPLANT
GLOVE BIOGEL PI IND STRL 6.5 (GLOVE) ×2 IMPLANT
GLOVE BIOGEL PI IND STRL 7.0 (GLOVE) IMPLANT
GLOVE BIOGEL PI INDICATOR 6.5 (GLOVE) ×2
GLOVE BIOGEL PI INDICATOR 7.0 (GLOVE)
GLOVE ECLIPSE 6.5 STRL STRAW (GLOVE) IMPLANT
GLOVE ECLIPSE 7.5 STRL STRAW (GLOVE) IMPLANT
GLOVE EXAM NITRILE LRG STRL (GLOVE) IMPLANT
GLOVE EXAM NITRILE MD LF STRL (GLOVE) ×2 IMPLANT
GLOVE SKINSENSE NS SZ6.5 (GLOVE)
GLOVE SKINSENSE NS SZ7.0 (GLOVE)
GLOVE SKINSENSE STRL SZ6.5 (GLOVE) IMPLANT
GLOVE SKINSENSE STRL SZ7.0 (GLOVE) IMPLANT
GOWN SRG XL XLNG 56XLVL 4 (GOWN DISPOSABLE) ×1 IMPLANT
GOWN STRL NON-REIN XL XLG LVL4 (GOWN DISPOSABLE) ×1
INST SET CATARACT ~~LOC~~ (KITS) ×2 IMPLANT
KIT VITRECTOMY (OPHTHALMIC RELATED) IMPLANT
PAD ARMBOARD 7.5X6 YLW CONV (MISCELLANEOUS) ×2 IMPLANT
PROC W NO LENS (INTRAOCULAR LENS)
PROC W SPEC LENS (INTRAOCULAR LENS)
PROCESS W NO LENS (INTRAOCULAR LENS) IMPLANT
PROCESS W SPEC LENS (INTRAOCULAR LENS) IMPLANT
RING MALYGIN (MISCELLANEOUS) IMPLANT
SIGHTPATH CAT PROC W REG LENS (Ophthalmic Related) ×2 IMPLANT
VISCOELASTIC ADDITIONAL (OPHTHALMIC RELATED) IMPLANT
WATER STERILE IRR 250ML POUR (IV SOLUTION) ×2 IMPLANT

## 2012-10-11 NOTE — Transfer of Care (Signed)
Immediate Anesthesia Transfer of Care Note  Patient: Alicia Harrington  Procedure(s) Performed: Procedure(s) (LRB) with comments: CATARACT EXTRACTION PHACO AND INTRAOCULAR LENS PLACEMENT (IOC) (Right) - CDE:10.82  Patient Location: PACU and Short Stay  Anesthesia Type:MAC  Level of Consciousness: awake, alert , oriented and patient cooperative  Airway & Oxygen Therapy: Patient Spontanous Breathing  Post-op Assessment: Report given to PACU RN, Post -op Vital signs reviewed and stable and Patient moving all extremities  Post vital signs: Reviewed and stable  Complications: No apparent anesthesia complications

## 2012-10-11 NOTE — Brief Op Note (Signed)
10/11/2012  8:44 AM  PATIENT:  Alicia Harrington  78 y.o. female  PRE-OPERATIVE DIAGNOSIS:  nuclear cataract right eye  POST-OPERATIVE DIAGNOSIS:  nuclear cataract right eye  PROCEDURE:  Procedure(s): CATARACT EXTRACTION PHACO AND INTRAOCULAR LENS PLACEMENT (IOC)  SURGEON:  Surgeon(s): Susa Simmonds, MD  ASSISTANTS: Marya Landry, CST   ANESTHESIA STAFF: Despina Hidden, CRNA - CRNA Laurene Footman, MD - Anesthesiologist  ANESTHESIA:   topical and MAC  REQUESTED LENS POWER: 22.5  LENS IMPLANT INFORMATION:  Alcon SN60 WF  S/n [id number]  Exp 05/2016  CUMULATIVE DISSIPATED ENERGY:10.82  INDICATIONS:see H&P  OP FINDINGS:none  COMPLICATIONS:None  DICTATION #: see scanned note  PATIENT DISPOSITION:  Short Stay

## 2012-10-11 NOTE — Op Note (Signed)
See scaqnned op note done today

## 2012-10-11 NOTE — H&P (Signed)
I have reviewed the pre printed H&P, the patient was re-examined, and I have identified no significant interval changes in the patient's medical condition.  There is no change in the plan of care since the history and physical of record. 

## 2012-10-11 NOTE — Anesthesia Preprocedure Evaluation (Signed)
Anesthesia Evaluation  Patient identified by MRN, date of birth, ID band Patient awake    Reviewed: Allergy & Precautions, H&P , NPO status , Patient's Chart, lab work & pertinent test results  History of Anesthesia Complications (+) PONV  Airway Mallampati: III TM Distance: <3 FB     Dental  (+) Teeth Intact   Pulmonary shortness of breath and with exertion,  breath sounds clear to auscultation        Cardiovascular hypertension, +CHF + dysrhythmias Atrial Fibrillation Rhythm:Regular     Neuro/Psych    GI/Hepatic negative GI ROS,   Endo/Other    Renal/GU Renal InsufficiencyRenal disease     Musculoskeletal   Abdominal   Peds  Hematology   Anesthesia Other Findings   Reproductive/Obstetrics                           Anesthesia Physical Anesthesia Plan  ASA: III  Anesthesia Plan: MAC   Post-op Pain Management:    Induction: Intravenous  Airway Management Planned: Nasal Cannula  Additional Equipment:   Intra-op Plan:   Post-operative Plan:   Informed Consent: I have reviewed the patients History and Physical, chart, labs and discussed the procedure including the risks, benefits and alternatives for the proposed anesthesia with the patient or authorized representative who has indicated his/her understanding and acceptance.     Plan Discussed with:   Anesthesia Plan Comments:         Anesthesia Quick Evaluation

## 2012-10-11 NOTE — Anesthesia Postprocedure Evaluation (Signed)
  Anesthesia Post-op Note  Patient: Alicia Harrington  Procedure(s) Performed: Procedure(s) (LRB) with comments: CATARACT EXTRACTION PHACO AND INTRAOCULAR LENS PLACEMENT (IOC) (Right) - CDE:10.82  Patient Location: PACU and Short Stay  Anesthesia Type:MAC  Level of Consciousness: awake, alert , oriented and patient cooperative  Airway and Oxygen Therapy: Patient Spontanous Breathing  Post-op Pain: none  Post-op Assessment: Post-op Vital signs reviewed, Patient's Cardiovascular Status Stable, Respiratory Function Stable, Patent Airway, No signs of Nausea or vomiting and Pain level controlled  Post-op Vital Signs: Reviewed and stable  Complications: No apparent anesthesia complications

## 2012-10-12 ENCOUNTER — Encounter (HOSPITAL_COMMUNITY): Payer: Self-pay | Admitting: Ophthalmology

## 2012-10-20 ENCOUNTER — Encounter (HOSPITAL_BASED_OUTPATIENT_CLINIC_OR_DEPARTMENT_OTHER): Payer: Medicare Other | Admitting: Oncology

## 2012-10-20 ENCOUNTER — Encounter (HOSPITAL_COMMUNITY): Payer: Self-pay | Admitting: Oncology

## 2012-10-20 VITALS — BP 126/75 | HR 88 | Temp 97.7°F | Resp 16 | Wt 123.9 lb

## 2012-10-20 DIAGNOSIS — C773 Secondary and unspecified malignant neoplasm of axilla and upper limb lymph nodes: Secondary | ICD-10-CM | POA: Diagnosis not present

## 2012-10-20 DIAGNOSIS — I509 Heart failure, unspecified: Secondary | ICD-10-CM | POA: Diagnosis not present

## 2012-10-20 DIAGNOSIS — C50919 Malignant neoplasm of unspecified site of unspecified female breast: Secondary | ICD-10-CM

## 2012-10-20 NOTE — Progress Notes (Signed)
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 This was a grade 3 cancer with LV I., but estrogen receptor +99%, progesterone receptor +100%, HER-2/neu 3+ positive with a Ki-67 marker high at 45%. She did participated in NSABP-41 randomized to a.c. every 3 weeks for 4 cycles followed by Taxol and trastuzumab. She was unable to tolerate a trastuzumab due to CHF. She had total 7 treatments of the Taxol before developing severe weakness, fatigue, and grade 1 peripheral neuropathy. She has minimal neuropathy symptoms presently in her toes.  Problem #2 left inguinal lump which I believe is her femoral artery with or without a small to 3 mm lymph node versus plaque palpable above this pulsating left femoral artery. Problem #3 possible Lyme disease treated by Dr. Kellie Simmering Problem #4 CHF complicated by atrial fibrillation well-controlled with medication by Dr. Dietrich Pates  After she was treated with doxycycline her joint issues went away in her hands. She been on steroids which helped everything except her hands according to her and her husband. From a heart standpoint she is doing very well and is stable. Her Coumadin was difficult to manage she states while on the antibiotic. She developed a week and a half ago after getting out of the shower lump in her left groin which she thinks is a lymph node. She has not had fevers chills or night sweats that she had a large palpable hematoma left anterior leg several months ago while on the antibiotic. She can still palpate some of the blood. BP 126/75  Pulse 88  Temp 97.7 F (36.5 C)  Resp 16  Wt 123 lb 14.4 oz (56.201 kg)  Vital signs are stable she looks very good and is in no acute distress. She has no lymphadenopathy that is pathological in the cervical, supraclavicular, infraclavicular, axillary,  epitrochlear, or inguinal areas. The left femoral artery is very prominent compared to the right and feels 2 or 3 mm larger in my opinion but does seem more palpably/easily felt. Heart shows an irregular rhythm well controlled rate without distinct murmur rub or gallop. Breast exam is negative for masses on the right and the left but the right shows surgical/radiation therapy changes only. There is no palpable abnormality otherwise. Her left breast is negative. Her lungs are clear to auscultation and percussion. Skin exam shows no malignant changes. Abdomen is soft and nontender without hepatosplenomegaly. Bowel sounds are very normal. There is no distention no ascites. There may be little irregularity on the surface of the left femoral artery in the inguinal area versus scarring versus a plaque versus a tiny lymph node. Nevertheless she is no ankle edema there is a small palpable hematoma left approximate 5-6 mm across in the left anterior shin. She is alert and oriented.  She had blood work today which was quite good. I would like to see her again in February sooner if she thinks the area in the left groin is changing but I do believe it is very benign. We certainly could do an ultrasound or a CT scan of the pelvis but I don't think that's indicated presently and she and her husband would rather not pursue that. Therefore I will see her in February sooner if need

## 2012-10-20 NOTE — Patient Instructions (Addendum)
[facility]  Discharge Instructions  RECOMMENDATIONS MADE BY THE CONSULTANT AND ANY TEST RESULTS WILL BE SENT TO YOUR REFERRING DOCTOR.   EXAM FINDINGS BY MD TODAY AND SIGNS AND SYMPTOMS TO REPORT TO CLINIC OR PRIMARY MD: exam and discussion by MD.  Does not think it's anything to worry about. If it changes or becomes painful, let us know.  MEDICATIONS PRESCRIBED: none     SPECIAL INSTRUCTIONS/FOLLOW-UP: Lab work Needed in February and Return to Clinic in February.   I acknowledge that I have been informed and understand all the instructions given to me and received a copy. I do not have any more questions at this time, but understand that I may call the Specialty Clinic at [facility] at [phone] during business hours should I have any further questions or need assistance in obtaining follow-up care.    __________________________________________  _____________  __________ Signature of Patient or Authorized Representative            Date                   Time    __________________________________________ Nurse's Signature

## 2012-11-01 ENCOUNTER — Ambulatory Visit (INDEPENDENT_AMBULATORY_CARE_PROVIDER_SITE_OTHER): Payer: Medicare Other | Admitting: *Deleted

## 2012-11-01 DIAGNOSIS — I4891 Unspecified atrial fibrillation: Secondary | ICD-10-CM | POA: Diagnosis not present

## 2012-11-01 DIAGNOSIS — Z7901 Long term (current) use of anticoagulants: Secondary | ICD-10-CM

## 2012-11-01 LAB — POCT INR: INR: 2.8

## 2012-11-25 ENCOUNTER — Telehealth (INDEPENDENT_AMBULATORY_CARE_PROVIDER_SITE_OTHER): Payer: Self-pay | Admitting: General Surgery

## 2012-11-25 NOTE — Telephone Encounter (Signed)
[facility] [employer] called and stated that they need a written Rx faxed for Detailed written order

## 2012-11-29 ENCOUNTER — Ambulatory Visit (HOSPITAL_COMMUNITY): Payer: Medicare Other | Admitting: Oncology

## 2012-11-29 NOTE — Telephone Encounter (Signed)
RX faxed to [facility] [phone] with confirmation received.

## 2012-12-15 DIAGNOSIS — I4891 Unspecified atrial fibrillation: Secondary | ICD-10-CM | POA: Diagnosis not present

## 2012-12-16 ENCOUNTER — Ambulatory Visit (INDEPENDENT_AMBULATORY_CARE_PROVIDER_SITE_OTHER): Payer: Medicare Other | Admitting: *Deleted

## 2012-12-16 DIAGNOSIS — Z7901 Long term (current) use of anticoagulants: Secondary | ICD-10-CM

## 2012-12-16 DIAGNOSIS — I4891 Unspecified atrial fibrillation: Secondary | ICD-10-CM

## 2012-12-27 ENCOUNTER — Encounter (HOSPITAL_COMMUNITY): Payer: Medicare Other | Attending: Oncology

## 2012-12-27 DIAGNOSIS — M1990 Unspecified osteoarthritis, unspecified site: Secondary | ICD-10-CM | POA: Diagnosis not present

## 2012-12-27 DIAGNOSIS — C50919 Malignant neoplasm of unspecified site of unspecified female breast: Secondary | ICD-10-CM | POA: Diagnosis not present

## 2012-12-27 LAB — COMPREHENSIVE METABOLIC PANEL
ALT: 21 U/L (ref 0–35)
Alkaline Phosphatase: 67 U/L (ref 39–117)
CO2: 25 mEq/L (ref 19–32)
Calcium: 8.9 mg/dL (ref 8.4–10.5)
GFR calc Af Amer: 65 mL/min — ABNORMAL LOW (ref >90)
GFR calc non Af Amer: 56 mL/min — ABNORMAL LOW (ref >90)
Glucose, Bld: 96 mg/dL (ref 70–99)
Potassium: 3.3 mEq/L — ABNORMAL LOW (ref 3.5–5.1)
Sodium: 144 mEq/L (ref 135–145)

## 2012-12-27 LAB — DIFFERENTIAL
Basophils Absolute: 0 10*3/uL (ref 0.0–0.1)
Basophils Relative: 1 % (ref 0–1)
Eosinophils Absolute: 0.1 10*3/uL (ref 0.0–0.7)
Eosinophils Relative: 1 % (ref 0–5)

## 2012-12-27 LAB — CBC
HCT: 39 % (ref 36.0–46.0)
Hemoglobin: 13.4 g/dL (ref 12.0–15.0)
MCH: 35.2 pg — ABNORMAL HIGH (ref 26.0–34.0)
RBC: 3.81 MIL/uL — ABNORMAL LOW (ref 3.87–5.11)

## 2012-12-27 NOTE — Progress Notes (Signed)
Labs drawn today for [id number],cbc/diff,cmp 

## 2012-12-28 ENCOUNTER — Encounter (HOSPITAL_BASED_OUTPATIENT_CLINIC_OR_DEPARTMENT_OTHER): Payer: Medicare Other | Admitting: Oncology

## 2012-12-28 ENCOUNTER — Encounter (HOSPITAL_COMMUNITY): Payer: Self-pay | Admitting: Oncology

## 2012-12-28 VITALS — BP 123/66 | HR 72 | Temp 97.6°F | Resp 16 | Wt 125.4 lb

## 2012-12-28 DIAGNOSIS — M549 Dorsalgia, unspecified: Secondary | ICD-10-CM

## 2012-12-28 DIAGNOSIS — M1990 Unspecified osteoarthritis, unspecified site: Secondary | ICD-10-CM

## 2012-12-28 DIAGNOSIS — E876 Hypokalemia: Secondary | ICD-10-CM

## 2012-12-28 DIAGNOSIS — C50919 Malignant neoplasm of unspecified site of unspecified female breast: Secondary | ICD-10-CM | POA: Diagnosis not present

## 2012-12-28 DIAGNOSIS — C773 Secondary and unspecified malignant neoplasm of axilla and upper limb lymph nodes: Secondary | ICD-10-CM

## 2012-12-28 MED ORDER — PREDNISONE 10 MG PO KIT: PACK | ORAL | Status: DC

## 2012-12-28 NOTE — Patient Instructions (Addendum)
[facility] Discharge Instructions  RECOMMENDATIONS MADE BY THE CONSULTANT AND ANY TEST RESULTS WILL BE SENT TO YOUR REFERRING PHYSICIAN.  EXAM FINDINGS BY THE PHYSICIAN TODAY AND SIGNS OR SYMPTOMS TO REPORT TO CLINIC OR PRIMARY PHYSICIAN: Exam and discussion by MD.  Will make consult with Physical Therapy for back strengthening and stretching exercises.  Make an appointment to see Dr. Kellie Simmering about your hands.  MEDICATIONS PRESCRIBED:  Prednisone Taper  INSTRUCTIONS GIVEN AND DISCUSSED: Report any new lumps, bone pain or shortness of breath.  SPECIAL INSTRUCTIONS/FOLLOW-UP: Follow-up in 6 months.  Thank you for choosing [facility] to provide your oncology and hematology care.  To afford each patient quality time with our providers, please arrive at least 15 minutes before your scheduled appointment time.  With your help, our goal is to use those 15 minutes to complete the necessary work-up to ensure our physicians have the information they need to help with your evaluation and healthcare recommendations.    Effective January 1st, 2014, we ask that you re-schedule your appointment with our physicians should you arrive 10 or more minutes late for your appointment.  We strive to give you quality time with our providers, and arriving late affects you and other patients whose appointments are after yours.    Again, thank you for choosing [facility]  Our hope is that these requests will decrease the amount of time that you wait before being seen by our physicians.       _____________________________________________________________  Should you have questions after your visit to [facility], please contact our office at [phone] between the hours of 8:30 a.m. and 5:00 p.m.  Voicemails left after 4:30 p.m. will not be returned until the following business day.  For prescription refill requests, have your pharmacy contact our office  with your prescription refill request.

## 2012-12-28 NOTE — Progress Notes (Signed)
Problem #1 stage II (T2, N1, M0) high-grade infiltrating ductal carcinoma the right breast, 3.5 cm in size, clinically node-positive and biopsy proven have right axillary lymph node involvement diagnosed on 04/26/2008. This was a grade 3 cancer with LV I., ER +99%, PR +100%, HER-2/neu 3+ positive with a Ki-67 marker high at 45%. She did participate in NSABP-41 trial and was randomized to Adriamycin/Cytoxan every 3 weeks for 4 cycles followed by Taxol and trastuzumab. She was unable to tolerate a trastuzumab due to congestive heart failure. She had a total of 7 treatments of the Taxol for developing severe weakness, fatigue, and grade 1 peripheral neuropathy. She has minimal symptoms down her toes at best. She is now on tamoxifen 20 mg a day which started in June 2010. Thus far she has remained free of disease. Problem #2 atrial fibrillation on Coumadin Problem #3 CHF Problem #4 hypokalemia and she will take her potassium pill one twice a day for a week and then 1 pill every day. She has been using it only when taking Lasix. Problem #5 degenerative joint disease of the back status post disc surgery by Dr. Lovell Sheehan with nice improvement Problem #6 mild scoliosis with chronic left-sided back discomfort Problem #7 right shoulder degenerative joint disease with limited range of motion Problem #8 degenerative joint disease versus Lyme disease Problem #9 reactive depression from the death of her son several years ago  She is here today with her husband and they just returned from [place]. 2 days to get back due to troubles with the airplane. They had an excessive 8 hours delay during this flight. She also had back pain after walking daily for 3 weeks while in [place]. It started with the right side but I did get better and was back to her chronic left-sided back discomfort. Unfortunately also her thumbs especially her right thumb started hurting and swelling once again. There is no increased temperature  over the right thumb which is very swollen. She was treated for Lyme disease but may have more degenerative arthritis than anything else potentially. I want her to go back to see Dr. Kellie Simmering in the near future. I think she also needs stretching exercises for her back on a regular basis after she walks etc.  Her oncology review of systems is negative. Vital signs are stable. Lymph nodes remain negative throughout. The left inguinal area is just or femoral artery where she had catheterization in the past be remembered. She is getting more hard of hearing wears a hearing aid in the right year. The left ear was damaged many years ago. Lungs are clear. Heart shows an irregular rhythm but normal rate without obvious S3 gallop. She has no obvious jugular venous distention. Her liver and spleen are not palpably enlarged. Bowel sounds are normal. She has no leg findings other than 1+ to trace pitting edema.  Her laboratory work showed only mild hypokalemia which I addressed above.  We will see her in 6 months this time and she will continue the tamoxifen for at least 5 full years and possibly 10 years with the strong estrogen and progesterone receptor positivity.

## 2013-01-04 ENCOUNTER — Ambulatory Visit (HOSPITAL_COMMUNITY)
Admission: RE | Admit: 2013-01-04 | Discharge: 2013-01-04 | Disposition: A | Discharge status: Home / Self Care | Payer: Medicare Other | Source: Ambulatory Visit | Attending: Oncology | Admitting: Oncology

## 2013-01-04 DIAGNOSIS — I10 Essential (primary) hypertension: Secondary | ICD-10-CM | POA: Insufficient documentation

## 2013-01-04 DIAGNOSIS — M545 Low back pain, unspecified: Secondary | ICD-10-CM | POA: Diagnosis not present

## 2013-01-04 DIAGNOSIS — R29898 Other symptoms and signs involving the musculoskeletal system: Secondary | ICD-10-CM | POA: Insufficient documentation

## 2013-01-04 DIAGNOSIS — R262 Difficulty in walking, not elsewhere classified: Secondary | ICD-10-CM | POA: Insufficient documentation

## 2013-01-04 DIAGNOSIS — IMO0001 Reserved for inherently not codable concepts without codable children: Secondary | ICD-10-CM | POA: Insufficient documentation

## 2013-01-04 DIAGNOSIS — M6281 Muscle weakness (generalized): Secondary | ICD-10-CM | POA: Insufficient documentation

## 2013-01-04 NOTE — Evaluation (Signed)
Physical Therapy Evaluation  Patient Details  Name: Alicia Harrington MRN: [id number] Date of Birth: 04/24/1932  Today's Date: 01/05/2013 Time: 1118-1200 PT Time Calculation (min): 42 min  Visit#:  1 of 8  Re-eval: 02/04/13 Assessment Diagnosis: LBP  Authorization: medicare   Authorization Visit#:  1 of  8   Past Medical History:  Past Medical History  Diagnosis Date  . Cardiomyopathy 2009    possibly secondary to Adriamycin; EF 40% in 12/09 and 50% 3/10; pulmonary edema in 2009  . Chronic kidney disease (CKD), stage I     when on diuretic with creatinine of 1.6  . Hypertension   . Adenocarcinoma, breast     treated with lumpectomy, node dissection, chemo and RT  . Hearing impairment   . DJD (degenerative joint disease)     right knee and lumbosacral spine  . History of angina 2001    normal cornary arteries in 2001  . Macular degeneration, dry     + cataracts  . Chronic anticoagulation 04/16/2011  . Complication of anesthesia     patient vomits with demerol  . PONV (postoperative nausea and vomiting)   . Sick sinus syndrome     Paroxysmal to permanent AF; initially first degree AV block also noted  . Neuropathy     fingers and toes  . Ataxia    Past Surgical History:  Past Surgical History  Procedure Laterality Date  . Appendectomy    . Dilation and curettage of uterus    . Middle ear surgery      Left  . Tonsillectomy    . Microdiscectomy lumbar  2009    lumbosacral spine  . Mastectomy partial / lumpectomy w/ axillary lymphadenectomy  2009    Carcinoma of the breast  . Colonoscopy  02/11/2012    Colonoscopy plus polypectomy x2, Alicia Hippo, MD;  [facility]  . Port-a-cath removal    . Cataract extraction w/phaco  10/11/2012    Procedure: CATARACT EXTRACTION PHACO AND INTRAOCULAR LENS PLACEMENT (IOC);  Surgeon: Alicia Simmonds, MD;  Location: [facility];  Service: Ophthalmology;  Laterality: Right;  CDE:10.82    Subjective Symptoms/Limitations Symptoms:  Alicia Harrington states that she has been having back pain several months ago on a maching at the [facility].  She just backed off just a bit and progressed to where she was walking for two miles a day.  She got up and her could not step on her left leg without having significant pain in her low back..  The patient is currenty taking predisone and is feeling much better.  She is being referred for a HEP to strengthen and stretch her back. How long can you sit comfortably?: THe patient is doing well now but before the predisone she would state that sitting increased her back ache. Pain Assessment Currently in Pain?: Yes Pain Score:   3 (prior to the predisone back ache was up to a 8/10) Pain Location: Back Pain Orientation: Left    Prior Functioning  Prior Function Leisure: Hobbies-yes (Comment)    Sensation/Coordination/Flexibility/Functional Tests Functional Tests Functional Tests: oswestry 20/50  Assessment RLE Strength Right Hip Flexion: 4/5 Right Hip Extension: 3/5 Right Hip ABduction: 3/5 Right Knee Flexion: 4/5 Right Knee Extension: 5/5 Right Ankle Dorsiflexion: 5/5 LLE Strength Left Hip Flexion: 3/5 Left Hip Extension: 3-/5 Left Hip ABduction: 3-/5 Left Knee Flexion: 3+/5 Left Knee Extension: 4/5 Left Ankle Dorsiflexion: 3/5 Lumbar AROM Lumbar Flexion: wnl with return causing more discomfort. Lumbar Extension: wnl  no change Lumbar - Right Side Bend: wnl Lumbar - Left Side Bend: wnl  Lumbar - Right Rotation: wnl Lumbar - Left Rotation: wnl  Exercise/Treatments  Stretches Active Hamstring Stretch: 2 reps;30 seconds Single Knee to Chest Stretch: 2 reps;30 seconds   Supine Ab Set: 10 reps Bent Knee Raise: 5 reps Bridge: 5 reps     Physical Therapy Assessment and Plan PT Assessment and Plan Clinical Impression Statement: Pt with decreased core mm strength, decreased LE strength which is causing LBP and difficulty walking and completing transisitonal motion such as sit  to stand. Pt will benefit from skilled PT to improve functional mobility Pt will benefit from skilled therapeutic intervention in order to improve on the following deficits: Difficulty walking;Decreased activity tolerance;Pain;Decreased strength Rehab Potential: Good PT Frequency: Min 2X/week PT Duration: 4 weeks PT Treatment/Interventions: Therapeutic activities;Therapeutic exercise PT Plan: begin SLR; sidelying Abduction, SL clam, and prone heel squeezes and SLR   as well as review first exercises.  Begin T-band ex for third treatment.  4th treatment wall pushups and B UE for stabilization.    Goals Home Exercise Program Pt will Perform Home Exercise Program: Independently PT Short Term Goals PT Short Term Goal 1: Pt to be able to sit with comfort for 60 min. PT Short Term Goal 2: strength to be improved 1/2 grade to be able to come sit to stand without discomfort PT Long Term Goals PT Long Term Goal 1: I in advance HEP PT Long Term Goal 2: Pain level no greater than a 2 Long Term Goal 3: strength of LE to be improved one grade to allow pt to walk for 2 miles without discomfort Long Term Goal 4: Pt to be able to sit for two hours for traveling without discomfort  Problem List Patient Active Problem List  Diagnosis  . HEARING IMPAIRMENT  . HYPERTENSION  . Atrial fibrillation  . Chronic anticoagulation  . Cardiomyopathy  . Sick sinus syndrome  . Chronic kidney disease (CKD), stage I  . Adenocarcinoma, breast  . DJD (degenerative joint disease)  . History of angina  . Macular degeneration, dry  . Pain in joint, shoulder region  . Muscle weakness (generalized)  . Fasting hyperglycemia  . Orthostatic hypotension  . Bilateral leg weakness  . Difficulty in walking    PT Plan of Care PT Home Exercise Plan: given  GP Functional Assessment Tool Used: oswestry Functional Limitation: Mobility: Walking and moving around Mobility: Walking and Moving Around Current Status ([id number]):  At least 40 percent but less than 60 percent impaired, limited or restricted Mobility: Walking and Moving Around Goal Status [id number]): At least 1 percent but less than 20 percent impaired, limited or restricted  Alicia Harrington 01/05/2013, 9:43 AM  Physician Documentation Your signature is required to indicate approval of the treatment plan as stated above.  Please sign and either send electronically or make a copy of this report for your files and return this physician signed original.   Please mark one 1.__approve of plan  2. ___approve of plan with the following conditions.   ______________________________                                                          _____________________ Physician Signature  Date  

## 2013-01-05 ENCOUNTER — Ambulatory Visit (HOSPITAL_COMMUNITY)
Admission: RE | Admit: 2013-01-05 | Discharge: 2013-01-05 | Disposition: A | Discharge status: Home / Self Care | Payer: Medicare Other | Source: Ambulatory Visit | Attending: Oncology | Admitting: Oncology

## 2013-01-05 ENCOUNTER — Ambulatory Visit (INDEPENDENT_AMBULATORY_CARE_PROVIDER_SITE_OTHER): Payer: Medicare Other | Admitting: *Deleted

## 2013-01-05 DIAGNOSIS — Z7901 Long term (current) use of anticoagulants: Secondary | ICD-10-CM | POA: Diagnosis not present

## 2013-01-05 DIAGNOSIS — M545 Low back pain, unspecified: Secondary | ICD-10-CM | POA: Diagnosis not present

## 2013-01-05 DIAGNOSIS — M6281 Muscle weakness (generalized): Secondary | ICD-10-CM | POA: Diagnosis not present

## 2013-01-05 DIAGNOSIS — R262 Difficulty in walking, not elsewhere classified: Secondary | ICD-10-CM | POA: Diagnosis not present

## 2013-01-05 DIAGNOSIS — I4891 Unspecified atrial fibrillation: Secondary | ICD-10-CM

## 2013-01-05 DIAGNOSIS — R29898 Other symptoms and signs involving the musculoskeletal system: Secondary | ICD-10-CM

## 2013-01-05 DIAGNOSIS — I10 Essential (primary) hypertension: Secondary | ICD-10-CM | POA: Diagnosis not present

## 2013-01-05 DIAGNOSIS — IMO0001 Reserved for inherently not codable concepts without codable children: Secondary | ICD-10-CM | POA: Diagnosis not present

## 2013-01-05 LAB — POCT INR: INR: 3.6

## 2013-01-05 NOTE — Progress Notes (Signed)
Physical Therapy Treatment Patient Details  Name: Alicia Harrington MRN: [id number] Date of Birth: February 20, 1932  Today's Date: 01/05/2013 Time: 7829-5621 PT Time Calculation (min): 46 min  Visit#: 2 of 8  Re-eval: 02/04/13  Charge: therex 28', MHP 15'  Authorization: medicare   Subjective: Symptoms/Limitations Symptoms: Pt stated she is feeling better today, LBP pain scale 2-3/10 today.  Pt compliant with 1st page of HEP Pain Assessment Currently in Pain?: Yes Pain Score:   3 Pain Location: Back Pain Orientation: Lower  Objective:   Exercise/Treatments Stretches Active Hamstring Stretch: 2 reps;30 seconds Supine Ab Set: 10 reps Bent Knee Raise: 10 reps;3 seconds Bridge: 10 reps Straight Leg Raise: 10 reps Sidelying Clam: 5 reps;Limitations Clam Limitations: 10 seconds Hip Abduction: 5 reps  Modalities Modalities: Moist Heat Moist Heat Therapy Number Minutes Moist Heat: 15 Minutes Moist Heat Location: Other (comment)  Physical Therapy Assessment and Plan PT Assessment and Plan Clinical Impression Statement: Reviewed HEP, pt able to demonstrate appropriate technique with min cueing for form and stability wtih exercises.  Added supine and SL exercises for LE strengthening with manual tactile cueing required for proper positioning and correct musculature activition.  Pt given MHP for pain relief at end of session. PT Plan: Next session begin prone heel squeeze and hip ext, begin tband for postural strengthening; 4th treatment wall pushups and BUE for stabilization.    Goals    Problem List Patient Active Problem List  Diagnosis  . HEARING IMPAIRMENT  . HYPERTENSION  . Atrial fibrillation  . Chronic anticoagulation  . Cardiomyopathy  . Sick sinus syndrome  . Chronic kidney disease (CKD), stage I  . Adenocarcinoma, breast  . DJD (degenerative joint disease)  . History of angina  . Macular degeneration, dry  . Pain in joint, shoulder region  . Muscle weakness  (generalized)  . Fasting hyperglycemia  . Orthostatic hypotension  . Bilateral leg weakness  . Difficulty in walking    PT - End of Session Activity Tolerance: Patient tolerated treatment well General Behavior During Session: [facility] for tasks performed Cognition: [facility] for tasks performed  GP    Juel Burrow 01/05/2013, 3:31 PM

## 2013-01-11 ENCOUNTER — Ambulatory Visit (HOSPITAL_COMMUNITY)
Admission: RE | Admit: 2013-01-11 | Discharge: 2013-01-11 | Disposition: A | Discharge status: Home / Self Care | Payer: Medicare Other | Source: Ambulatory Visit | Attending: Oncology | Admitting: Oncology

## 2013-01-11 DIAGNOSIS — R262 Difficulty in walking, not elsewhere classified: Secondary | ICD-10-CM | POA: Diagnosis not present

## 2013-01-11 DIAGNOSIS — M545 Low back pain, unspecified: Secondary | ICD-10-CM | POA: Diagnosis not present

## 2013-01-11 DIAGNOSIS — M6281 Muscle weakness (generalized): Secondary | ICD-10-CM | POA: Diagnosis not present

## 2013-01-11 DIAGNOSIS — I10 Essential (primary) hypertension: Secondary | ICD-10-CM | POA: Diagnosis not present

## 2013-01-11 DIAGNOSIS — IMO0001 Reserved for inherently not codable concepts without codable children: Secondary | ICD-10-CM | POA: Diagnosis not present

## 2013-01-11 NOTE — Progress Notes (Signed)
Physical Therapy Treatment Patient Details  Name: Alicia Harrington MRN: [id number] Date of Birth: August 20, 1932  Today's Date: 01/11/2013 Time: 1324-4010 PT Time Calculation (min): 42 min  Visit#: 3 of 8  Re-eval: 02/04/13 Charges: Therex x 28' Manual x 10'  Authorization: medicare    Subjective: Symptoms/Limitations Symptoms: Pt states that she feels she is improving. Pain Assessment Currently in Pain?: Yes Pain Score:   4 Pain Location: Back Pain Orientation: Lower   Exercise/Treatments Supine Ab Set: 10 reps Bent Knee Raise: 10 reps Bridge: 15 reps;5 seconds Straight Leg Raise: 10 reps Sidelying Clam: 5 reps;Limitations Clam Limitations: 10 seconds Hip Abduction: 15 reps  Manual Therapy Manual Therapy: Other (comment) Other Manual Therapy: SCS to R gluteal musculature   Physical Therapy Assessment and Plan PT Assessment and Plan Clinical Impression Statement: Pt completes therex well with minimal need for cueing. Pt requires vc's to decrease speed with exercises. Completed SCS with decreased sensitivity after completions. It was difficult to complete SCS as pt could into completely relax. Pt reports pain decrease to 2/10 at end of session. PT Plan: Next session begin tband for postural strengthening; 4th treatment wall pushups and BUE for stabilization.      Problem List Patient Active Problem List  Diagnosis  . HEARING IMPAIRMENT  . HYPERTENSION  . Atrial fibrillation  . Chronic anticoagulation  . Cardiomyopathy  . Sick sinus syndrome  . Chronic kidney disease (CKD), stage I  . Adenocarcinoma, breast  . DJD (degenerative joint disease)  . History of angina  . Macular degeneration, dry  . Pain in joint, shoulder region  . Muscle weakness (generalized)  . Fasting hyperglycemia  . Orthostatic hypotension  . Bilateral leg weakness  . Difficulty in walking    PT - End of Session Activity Tolerance: Patient tolerated treatment well General Behavior  During Session: [facility] for tasks performed Cognition: [facility] for tasks performed  Seth Bake, PTA  01/11/2013, 11:56 AM

## 2013-01-13 ENCOUNTER — Ambulatory Visit (HOSPITAL_COMMUNITY)
Admission: RE | Admit: 2013-01-13 | Discharge: 2013-01-13 | Disposition: A | Discharge status: Home / Self Care | Payer: Medicare Other | Source: Ambulatory Visit | Attending: Oncology | Admitting: Oncology

## 2013-01-13 DIAGNOSIS — IMO0001 Reserved for inherently not codable concepts without codable children: Secondary | ICD-10-CM | POA: Diagnosis not present

## 2013-01-13 DIAGNOSIS — M545 Low back pain, unspecified: Secondary | ICD-10-CM | POA: Diagnosis not present

## 2013-01-13 DIAGNOSIS — I10 Essential (primary) hypertension: Secondary | ICD-10-CM | POA: Diagnosis not present

## 2013-01-13 DIAGNOSIS — R262 Difficulty in walking, not elsewhere classified: Secondary | ICD-10-CM | POA: Diagnosis not present

## 2013-01-13 DIAGNOSIS — M6281 Muscle weakness (generalized): Secondary | ICD-10-CM | POA: Diagnosis not present

## 2013-01-13 NOTE — Progress Notes (Signed)
Physical Therapy Treatment Patient Details  Name: Alicia Harrington MRN: [id number] Date of Birth: [birth year]  Today's Date: 01/13/2013 Time: 8413-2440 PT Time Calculation (min): 45 min  Visit#: 4 of 8  Re-eval: 02/04/13 Authorization: medicare  Authorization Visit#: 4 of 8  Charges:  therex 40'  Subjective: Symptoms/Limitations Symptoms: pt. states her LBP is 3/10 today.  Reports her R lateral knee began bothering her since beginning therapy and now is 5/10. Pain Assessment Currently in Pain?: Yes Pain Score:   3 Pain Location: Back Pain Orientation: Mid;Lower   Exercise/Treatments Stretches Active Hamstring Stretch: 2 reps;30 seconds Single Knee to Chest Stretch: 2 reps;30 seconds Standing Scapular Retraction: 10 reps;Theraband Theraband Level (Scapular Retraction): Level 3 (Green) Row: 10 reps;Theraband Theraband Level (Row): Level 3 (Green) Shoulder Extension: 10 reps;Theraband Theraband Level (Shoulder Extension): Level 3 (Green) Supine Ab Set: 15 reps Bent Knee Raise: 10 reps Bridge: 15 reps;5 seconds Straight Leg Raise: 15 reps Other Supine Lumbar Exercises: hip add isometrics 10X5" Sidelying Clam: 10 reps Clam Limitations: 10 seconds Hip Abduction: 15 reps    Physical Therapy Assessment and Plan PT Assessment and Plan Clinical Impression Statement: Overall improvement in lumbar symptoms; some discomfort into L SI area, however no misalignment noted. R knee hurting today, however feel it is due to change in ambulation due to back pain, and/or arthritis, not due to any new activites or therex.  Pt. reported additional relief of lumbar pain at end of session.  Pt. going to rheumatologist and suggested she mention R knee pain to MD.  Began theraband postural exercises without difficulty today.,only required cues for form. PT Plan: Next session begin wall pushups and prone exercises to increase stabilization.     Problem List Patient Active Problem List   Diagnosis  . HEARING IMPAIRMENT  . HYPERTENSION  . Atrial fibrillation  . Chronic anticoagulation  . Cardiomyopathy  . Sick sinus syndrome  . Chronic kidney disease (CKD), stage I  . Adenocarcinoma, breast  . DJD (degenerative joint disease)  . History of angina  . Macular degeneration, dry  . Pain in joint, shoulder region  . Muscle weakness (generalized)  . Fasting hyperglycemia  . Orthostatic hypotension  . Bilateral leg weakness  . Difficulty in walking    PT - End of Session Activity Tolerance: Patient tolerated treatment well General Behavior During Session: [facility] for tasks performed Cognition: [facility] for tasks performed   Lurena Nida, PTA/CLT 01/13/2013, 2:55 PM

## 2013-01-17 DIAGNOSIS — M19049 Primary osteoarthritis, unspecified hand: Secondary | ICD-10-CM | POA: Diagnosis not present

## 2013-01-17 DIAGNOSIS — M064 Inflammatory polyarthropathy: Secondary | ICD-10-CM | POA: Diagnosis not present

## 2013-01-17 DIAGNOSIS — M25569 Pain in unspecified knee: Secondary | ICD-10-CM | POA: Diagnosis not present

## 2013-01-17 DIAGNOSIS — M719 Bursopathy, unspecified: Secondary | ICD-10-CM | POA: Diagnosis not present

## 2013-01-17 DIAGNOSIS — M67919 Unspecified disorder of synovium and tendon, unspecified shoulder: Secondary | ICD-10-CM | POA: Diagnosis not present

## 2013-01-18 ENCOUNTER — Ambulatory Visit (HOSPITAL_COMMUNITY)
Admission: RE | Admit: 2013-01-18 | Discharge: 2013-01-18 | Disposition: A | Discharge status: Home / Self Care | Payer: Medicare Other | Source: Ambulatory Visit | Attending: Oncology | Admitting: Oncology

## 2013-01-18 DIAGNOSIS — I10 Essential (primary) hypertension: Secondary | ICD-10-CM | POA: Diagnosis not present

## 2013-01-18 DIAGNOSIS — M545 Low back pain, unspecified: Secondary | ICD-10-CM | POA: Diagnosis not present

## 2013-01-18 DIAGNOSIS — IMO0001 Reserved for inherently not codable concepts without codable children: Secondary | ICD-10-CM | POA: Diagnosis not present

## 2013-01-18 DIAGNOSIS — M6281 Muscle weakness (generalized): Secondary | ICD-10-CM | POA: Diagnosis not present

## 2013-01-18 DIAGNOSIS — M19049 Primary osteoarthritis, unspecified hand: Secondary | ICD-10-CM | POA: Diagnosis not present

## 2013-01-18 DIAGNOSIS — M67919 Unspecified disorder of synovium and tendon, unspecified shoulder: Secondary | ICD-10-CM | POA: Diagnosis not present

## 2013-01-18 DIAGNOSIS — M25569 Pain in unspecified knee: Secondary | ICD-10-CM | POA: Diagnosis not present

## 2013-01-18 DIAGNOSIS — R262 Difficulty in walking, not elsewhere classified: Secondary | ICD-10-CM | POA: Diagnosis not present

## 2013-01-18 DIAGNOSIS — M064 Inflammatory polyarthropathy: Secondary | ICD-10-CM | POA: Diagnosis not present

## 2013-01-18 NOTE — Progress Notes (Signed)
Physical Therapy Treatment Patient Details  Name: ROMESHA SCHERER MRN: [id number] Date of Birth: May 09, 1932  Today's Date: 01/18/2013 Time: 4098-1191 PT Time Calculation (min): 47 min Visit#: 5 of 8  Re-eval: 02/04/13 Authorization: medicare  Authorization Visit#: 5 of 8  Charges:  therex 42'  Subjective: Symptoms/Limitations Symptoms: Pt. states she went to Dr. Kellie Simmering about her R knee. MD is going to drain fluid off knee and begin steriods to help calm it down.  States her back is doing much better and pain is only 2/10 today. Pain Assessment Currently in Pain?: Yes Pain Score:   2 Pain Location: Back   Exercise/Treatments Stretches Active Hamstring Stretch: 2 reps;30 seconds Standing Scapular Retraction: 10 reps;Theraband Theraband Level (Scapular Retraction): Level 3 (Green) Row: 10 reps;Theraband Theraband Level (Row): Level 3 (Green) Shoulder Extension: 10 reps;Theraband Theraband Level (Shoulder Extension): Level 3 (Green) Other Standing Lumbar Exercises: wall push ups 10 reps Supine Bent Knee Raise: 10 reps Bridge: 15 reps;5 seconds Straight Leg Raise: 15 reps Other Supine Lumbar Exercises: hip add isometrics 10X5" Sidelying Clam: 10 reps Clam Limitations: 10 seconds Hip Abduction: 15 reps Prone  Straight Leg Raise: 10 reps Other Prone Lumbar Exercises: heelsqueeze 10X5" holds       Physical Therapy Assessment and Plan PT Assessment and Plan Clinical Impression Statement: Continued improvement in symptoms with overall improved form and independence with therex.  R knee and R shoulder continue to hinder overall function.  Added wall push ups and prone stab exercises today.  Unable to complete UE stab in prone due to R shoulder discomfort; concentrated on LE's.  Continues to be guarded with theraband activity.  PT Plan: Continue to increase stabilization;  Add ball abdominal work.  Re-evaluate X 3 more visits.     Problem List Patient Active Problem  List  Diagnosis  . HEARING IMPAIRMENT  . HYPERTENSION  . Atrial fibrillation  . Chronic anticoagulation  . Cardiomyopathy  . Sick sinus syndrome  . Chronic kidney disease (CKD), stage I  . Adenocarcinoma, breast  . DJD (degenerative joint disease)  . History of angina  . Macular degeneration, dry  . Pain in joint, shoulder region  . Muscle weakness (generalized)  . Fasting hyperglycemia  . Orthostatic hypotension  . Bilateral leg weakness  . Difficulty in walking    PT - End of Session Activity Tolerance: Patient tolerated treatment well General Behavior During Session: [facility] for tasks performed Cognition: [facility] for tasks performed   Lurena Nida, PTA/CLT 01/18/2013, 12:00 PM

## 2013-01-19 ENCOUNTER — Ambulatory Visit (INDEPENDENT_AMBULATORY_CARE_PROVIDER_SITE_OTHER): Payer: Medicare Other | Admitting: *Deleted

## 2013-01-19 DIAGNOSIS — I4891 Unspecified atrial fibrillation: Secondary | ICD-10-CM | POA: Diagnosis not present

## 2013-01-19 DIAGNOSIS — Z7901 Long term (current) use of anticoagulants: Secondary | ICD-10-CM

## 2013-01-20 ENCOUNTER — Ambulatory Visit (HOSPITAL_COMMUNITY)
Admission: RE | Admit: 2013-01-20 | Discharge: 2013-01-20 | Disposition: A | Discharge status: Home / Self Care | Payer: Medicare Other | Source: Ambulatory Visit | Attending: Oncology | Admitting: Oncology

## 2013-01-20 DIAGNOSIS — M545 Low back pain, unspecified: Secondary | ICD-10-CM | POA: Diagnosis not present

## 2013-01-20 DIAGNOSIS — IMO0001 Reserved for inherently not codable concepts without codable children: Secondary | ICD-10-CM | POA: Diagnosis not present

## 2013-01-20 DIAGNOSIS — M6281 Muscle weakness (generalized): Secondary | ICD-10-CM | POA: Diagnosis not present

## 2013-01-20 DIAGNOSIS — I10 Essential (primary) hypertension: Secondary | ICD-10-CM | POA: Diagnosis not present

## 2013-01-20 DIAGNOSIS — R29898 Other symptoms and signs involving the musculoskeletal system: Secondary | ICD-10-CM

## 2013-01-20 DIAGNOSIS — R262 Difficulty in walking, not elsewhere classified: Secondary | ICD-10-CM | POA: Diagnosis not present

## 2013-01-20 NOTE — Progress Notes (Signed)
Physical Therapy Treatment Patient Details  Name: Alicia Harrington MRN: [id number] Date of Birth: 08/13/1932  Today's Date: 01/20/2013 Time: 4098-1191 PT Time Calculation (min): 42 min Charge there ex x 40 Visit#: 6 of 8  Re-eval: 01/27/13    Authorization: medicare  Authorization Visit#: 6 of 8   Subjective: Symptoms/Limitations Symptoms: Pt states that her back pain is much better.   Pain Assessment Currently in Pain?: No/denies   Exercise/Treatments      Stretches Active Hamstring Stretch: 2 reps;30 seconds Standing Scapular Retraction: 10 reps;Theraband Theraband Level (Scapular Retraction): Level 3 (Green) Row: 10 reps;Theraband Theraband Level (Row): Level 3 (Green) Shoulder Extension: 10 reps;Theraband Theraband Level (Shoulder Extension): Level 3 (Green) Other Standing Lumbar Exercises: wall push ups 10 reps Other Standing Lumbar Exercises: standing against wall B UE flex x 5. Supine Dead Bug: 10 reps Bridge: 15 reps;5 seconds Straight Leg Raise: 15 reps Other Supine Lumbar Exercises: reviewed correct way to come supine to sit and sit to supine. Sidelying Clam: 10 reps Clam Limitations: 10 seconds Hip Abduction: 15 reps Prone  Straight Leg Raise: 10 reps Other Prone Lumbar Exercises: heelsqueeze 10X5" holds Other Prone Lumbar Exercises: Shoulder extension x 10    Physical Therapy Assessment and Plan PT Assessment and Plan Clinical Impression Statement: Pt needs verbal and tactile cuing for t-band postural exercises.  Pt added B UE flex while standing at wall with assist due to R UE rotator cuff injury in the past.   Pt will benefit from skilled therapeutic intervention in order to improve on the following deficits: Difficulty walking;Decreased activity tolerance;Pain;Decreased strength Rehab Potential: Good PT Treatment/Interventions: Therapeutic activities;Therapeutic exercise PT Plan: Re-evaluate next week.      Goals Home Exercise Program Pt  will Perform Home Exercise Program: Independently PT Short Term Goals Time to Complete Short Term Goals: 2 weeks PT Short Term Goal 1: Pt to be able to sit with comfort for 60 min. PT Short Term Goal 1 - Progress: Progressing toward goal PT Short Term Goal 2 - Progress: Progressing toward goal PT Long Term Goals PT Long Term Goal 1: I in advance HEP PT Long Term Goal 2: Pain level no greater than a 2 PT Long Term Goal 2 - Progress: Met Long Term Goal 3 Progress: Progressing toward goal Long Term Goal 4: Pt to be able to sit for two hours for traveling without discomfort Long Term Goal 4 Progress: Progressing toward goal  Problem List Patient Active Problem List  Diagnosis  . HEARING IMPAIRMENT  . HYPERTENSION  . Atrial fibrillation  . Chronic anticoagulation  . Cardiomyopathy  . Sick sinus syndrome  . Chronic kidney disease (CKD), stage I  . Adenocarcinoma, breast  . DJD (degenerative joint disease)  . History of angina  . Macular degeneration, dry  . Pain in joint, shoulder region  . Muscle weakness (generalized)  . Fasting hyperglycemia  . Orthostatic hypotension  . Bilateral leg weakness  . Difficulty in walking    PT - End of Session Activity Tolerance: Patient tolerated treatment well General Behavior During Session: [facility] for tasks performed  GP Functional Assessment Tool Used: oswestry  RUSSELL,CINDY 01/20/2013, 12:00 PM

## 2013-01-24 ENCOUNTER — Ambulatory Visit (INDEPENDENT_AMBULATORY_CARE_PROVIDER_SITE_OTHER): Payer: Medicare Other | Admitting: *Deleted

## 2013-01-24 DIAGNOSIS — I4891 Unspecified atrial fibrillation: Secondary | ICD-10-CM | POA: Diagnosis not present

## 2013-01-24 DIAGNOSIS — Z7901 Long term (current) use of anticoagulants: Secondary | ICD-10-CM | POA: Diagnosis not present

## 2013-01-26 ENCOUNTER — Ambulatory Visit (HOSPITAL_COMMUNITY): Payer: Medicare Other | Admitting: *Deleted

## 2013-01-28 ENCOUNTER — Ambulatory Visit (HOSPITAL_COMMUNITY): Payer: Medicare Other | Admitting: Physical Therapy

## 2013-02-01 ENCOUNTER — Ambulatory Visit (HOSPITAL_COMMUNITY)
Admission: RE | Admit: 2013-02-01 | Discharge: 2013-02-01 | Disposition: A | Discharge status: Home / Self Care | Payer: Medicare Other | Source: Ambulatory Visit | Attending: Oncology | Admitting: Oncology

## 2013-02-01 DIAGNOSIS — R262 Difficulty in walking, not elsewhere classified: Secondary | ICD-10-CM | POA: Diagnosis not present

## 2013-02-01 DIAGNOSIS — M6281 Muscle weakness (generalized): Secondary | ICD-10-CM | POA: Diagnosis not present

## 2013-02-01 DIAGNOSIS — IMO0001 Reserved for inherently not codable concepts without codable children: Secondary | ICD-10-CM | POA: Diagnosis not present

## 2013-02-01 DIAGNOSIS — I10 Essential (primary) hypertension: Secondary | ICD-10-CM | POA: Insufficient documentation

## 2013-02-01 DIAGNOSIS — M545 Low back pain, unspecified: Secondary | ICD-10-CM | POA: Diagnosis not present

## 2013-02-01 NOTE — Evaluation (Signed)
Physical Therapy Re-evaluation  Patient Details  Name: Alicia Harrington MRN: [id number] Date of Birth: 16-Jan-1932  Today's Date: 02/01/2013 Time: 6213-0865 PT Time Calculation (min): 24 min  Visit#: 7 of 8  Re-eval: 01/27/13 Charges: MMT x 1 ROMM x 1 Self care x 10'  Authorization: medicare  Authorization Visit#: 7 of 8   Past Medical History:  Past Medical History  Diagnosis Date  . Cardiomyopathy 2009    possibly secondary to Adriamycin; EF 40% in 12/09 and 50% 3/10; pulmonary edema in 2009  . Chronic kidney disease (CKD), stage I     when on diuretic with creatinine of 1.6  . Hypertension   . Adenocarcinoma, breast     treated with lumpectomy, node dissection, chemo and RT  . Hearing impairment   . DJD (degenerative joint disease)     right knee and lumbosacral spine  . History of angina 2001    normal cornary arteries in 2001  . Macular degeneration, dry     + cataracts  . Chronic anticoagulation 04/16/2011  . Complication of anesthesia     patient vomits with demerol  . PONV (postoperative nausea and vomiting)   . Sick sinus syndrome     Paroxysmal to permanent AF; initially first degree AV block also noted  . Neuropathy     fingers and toes  . Ataxia    Past Surgical History:  Past Surgical History  Procedure Laterality Date  . Appendectomy    . Dilation and curettage of uterus    . Middle ear surgery      Left  . Tonsillectomy    . Microdiscectomy lumbar  2009    lumbosacral spine  . Mastectomy partial / lumpectomy w/ axillary lymphadenectomy  2009    Carcinoma of the breast  . Colonoscopy  02/11/2012    Colonoscopy plus polypectomy x2, Malissa Hippo, MD;  [facility]  . Port-a-cath removal    . Cataract extraction w/phaco  10/11/2012    Procedure: CATARACT EXTRACTION PHACO AND INTRAOCULAR LENS PLACEMENT (IOC);  Surgeon: Susa Simmonds, MD;  Location: [facility];  Service: Ophthalmology;  Laterality: Right;  CDE:10.82     Subjective Symptoms/Limitations Symptoms: Pt states that therapy has been sucessful and she is comfortable with D/C to HEP. Pain Assessment Currently in Pain?: No/denies   Sensation/Coordination/Flexibility/Functional Tests Functional Tests Functional Tests: oswestry3/50  Assessment RLE Strength Right Hip Flexion:  (4+/5 was 4/5) Right Hip Extension: 4/5 (was 3/5) Right Hip ABduction: 4/5 (was 3/5) Right Knee Flexion: 4/5 Right Knee Extension: 5/5 Right Ankle Dorsiflexion: 5/5 (was 4/5) LLE Strength Left Hip Flexion:  (4+/5 was 3/5) Left Hip Extension:  (4+/5 was 3-/5) Left Hip ABduction:  (4+/5 was 3-/5) Left Knee Flexion: 5/5 (was 3+/5) Left Knee Extension:  (4+/5 was 4/5) Left Ankle Dorsiflexion: 5/5 (was 3/5) Lumbar AROM Lumbar Flexion: wnl Lumbar Extension: wnl Lumbar - Right Side Bend: wnl Lumbar - Left Side Bend: wnl  Lumbar - Right Rotation: wnl Lumbar - Left Rotation: wnl   Physical Therapy Assessment and Plan PT Assessment and Plan Clinical Impression Statement: Pt has made great gains in strength and reprots no pain in her back. Pt states that is no longer limited by back pain. Oswestry scale improved from 20/50 to 3/50. Pt is comfortable with D/C to HEP.  PT Plan: Recommend D/C to HEP.    Goals Home Exercise Program Pt will Perform Home Exercise Program: Independently PT Short Term Goals Time to Complete Short Term Goals: 2  weeks PT Short Term Goal 1: Pt to be able to sit with comfort for 60 min. PT Short Term Goal 1 - Progress: Met PT Short Term Goal 2: strength to be improved 1/2 grade to be able to come sit to stand without discomfort PT Long Term Goals PT Long Term Goal 1: I in advance HEP PT Long Term Goal 1 - Progress: Met PT Long Term Goal 2: Pain level no greater than a 2 PT Long Term Goal 2 - Progress: Met Long Term Goal 3: Strength of LE to be improved one grade to allow pt to walk for 2 miles without discomfort Long Term Goal 4: Pt to  be able to sit for two hours for traveling without discomfort Long Term Goal 4 Progress: Met  Problem List Patient Active Problem List  Diagnosis  . HEARING IMPAIRMENT  . HYPERTENSION  . Atrial fibrillation  . Chronic anticoagulation  . Cardiomyopathy  . Sick sinus syndrome  . Chronic kidney disease (CKD), stage I  . Adenocarcinoma, breast  . DJD (degenerative joint disease)  . History of angina  . Macular degeneration, dry  . Pain in joint, shoulder region  . Muscle weakness (generalized)  . Fasting hyperglycemia  . Orthostatic hypotension  . Bilateral leg weakness  . Difficulty in walking    PT - End of Session Activity Tolerance: Patient tolerated treatment well General Behavior During Session: [facility] for tasks performed Cognition: [facility] for tasks performed  GP Functional Assessment Tool Used: oswestry Functional Limitation: Mobility: Walking and moving around Mobility: Walking and Moving Around Current Status ([id number]): At least 1 percent but less than 20 percent impaired, limited or restricted Mobility: Walking and Moving Around Goal Status [phone]): At least 1 percent but less than 20 percent impaired, limited or restricted Mobility: Walking and Moving Around Discharge Status [phone]): At least 1 percent but less than 20 percent impaired, limited or restricted  Antonieta Iba 02/01/2013, 9:22 AM  Physician Documentation Your signature is required to indicate approval of the treatment plan as stated above.  Please sign and either send electronically or make a copy of this report for your files and return this physician signed original.   Please mark one 1.__approve of plan  2. ___approve of plan with the following conditions.   ______________________________                                                          _____________________ Physician Signature                                                                                                              Date

## 2013-02-03 ENCOUNTER — Ambulatory Visit (INDEPENDENT_AMBULATORY_CARE_PROVIDER_SITE_OTHER): Payer: Medicare Other | Admitting: *Deleted

## 2013-02-03 DIAGNOSIS — I4891 Unspecified atrial fibrillation: Secondary | ICD-10-CM | POA: Diagnosis not present

## 2013-02-03 DIAGNOSIS — Z7901 Long term (current) use of anticoagulants: Secondary | ICD-10-CM | POA: Diagnosis not present

## 2013-02-09 DIAGNOSIS — M719 Bursopathy, unspecified: Secondary | ICD-10-CM | POA: Diagnosis not present

## 2013-02-09 DIAGNOSIS — M67919 Unspecified disorder of synovium and tendon, unspecified shoulder: Secondary | ICD-10-CM | POA: Diagnosis not present

## 2013-02-09 DIAGNOSIS — Z853 Personal history of malignant neoplasm of breast: Secondary | ICD-10-CM | POA: Diagnosis not present

## 2013-02-09 DIAGNOSIS — Z79899 Other long term (current) drug therapy: Secondary | ICD-10-CM | POA: Diagnosis not present

## 2013-02-09 DIAGNOSIS — M19049 Primary osteoarthritis, unspecified hand: Secondary | ICD-10-CM | POA: Diagnosis not present

## 2013-02-09 DIAGNOSIS — M25569 Pain in unspecified knee: Secondary | ICD-10-CM | POA: Diagnosis not present

## 2013-02-11 ENCOUNTER — Other Ambulatory Visit: Payer: Self-pay

## 2013-02-11 MED ORDER — WARFARIN SODIUM 5 MG PO TABS: 5.0000 mg | ORAL_TABLET | Freq: Every day | ORAL | Status: DC

## 2013-02-17 ENCOUNTER — Ambulatory Visit (INDEPENDENT_AMBULATORY_CARE_PROVIDER_SITE_OTHER): Payer: Medicare Other | Admitting: *Deleted

## 2013-02-17 DIAGNOSIS — I4891 Unspecified atrial fibrillation: Secondary | ICD-10-CM | POA: Diagnosis not present

## 2013-02-17 DIAGNOSIS — Z7901 Long term (current) use of anticoagulants: Secondary | ICD-10-CM | POA: Diagnosis not present

## 2013-03-02 ENCOUNTER — Encounter: Payer: Self-pay | Admitting: Oncology

## 2013-03-07 DIAGNOSIS — Z7901 Long term (current) use of anticoagulants: Secondary | ICD-10-CM | POA: Diagnosis not present

## 2013-03-07 DIAGNOSIS — I4891 Unspecified atrial fibrillation: Secondary | ICD-10-CM | POA: Diagnosis not present

## 2013-03-08 ENCOUNTER — Ambulatory Visit (INDEPENDENT_AMBULATORY_CARE_PROVIDER_SITE_OTHER): Payer: Medicare Other | Admitting: *Deleted

## 2013-03-08 DIAGNOSIS — Z7901 Long term (current) use of anticoagulants: Secondary | ICD-10-CM | POA: Diagnosis not present

## 2013-03-08 DIAGNOSIS — I4891 Unspecified atrial fibrillation: Secondary | ICD-10-CM

## 2013-03-17 ENCOUNTER — Telehealth: Payer: Self-pay | Admitting: *Deleted

## 2013-03-17 DIAGNOSIS — Z7901 Long term (current) use of anticoagulants: Secondary | ICD-10-CM | POA: Diagnosis not present

## 2013-03-17 DIAGNOSIS — I4891 Unspecified atrial fibrillation: Secondary | ICD-10-CM

## 2013-03-17 NOTE — Telephone Encounter (Signed)
Order faxed as requested

## 2013-03-18 ENCOUNTER — Ambulatory Visit (INDEPENDENT_AMBULATORY_CARE_PROVIDER_SITE_OTHER): Payer: Medicare Other | Admitting: *Deleted

## 2013-03-18 DIAGNOSIS — I4891 Unspecified atrial fibrillation: Secondary | ICD-10-CM

## 2013-03-18 DIAGNOSIS — Z7901 Long term (current) use of anticoagulants: Secondary | ICD-10-CM

## 2013-03-20 ENCOUNTER — Encounter: Payer: Self-pay | Admitting: Cardiology

## 2013-03-30 DIAGNOSIS — L821 Other seborrheic keratosis: Secondary | ICD-10-CM | POA: Diagnosis not present

## 2013-03-31 ENCOUNTER — Ambulatory Visit (INDEPENDENT_AMBULATORY_CARE_PROVIDER_SITE_OTHER): Payer: Medicare Other | Admitting: *Deleted

## 2013-03-31 DIAGNOSIS — I4891 Unspecified atrial fibrillation: Secondary | ICD-10-CM | POA: Diagnosis not present

## 2013-03-31 DIAGNOSIS — Z7901 Long term (current) use of anticoagulants: Secondary | ICD-10-CM

## 2013-03-31 LAB — POCT INR: INR: 4.5

## 2013-04-07 ENCOUNTER — Ambulatory Visit (INDEPENDENT_AMBULATORY_CARE_PROVIDER_SITE_OTHER): Payer: Medicare Other | Admitting: Cardiology

## 2013-04-07 ENCOUNTER — Ambulatory Visit (INDEPENDENT_AMBULATORY_CARE_PROVIDER_SITE_OTHER): Payer: Medicare Other | Admitting: *Deleted

## 2013-04-07 ENCOUNTER — Encounter: Payer: Self-pay | Admitting: Cardiology

## 2013-04-07 VITALS — BP 125/78 | HR 71 | Ht 61.0 in | Wt 123.8 lb

## 2013-04-07 DIAGNOSIS — I428 Other cardiomyopathies: Secondary | ICD-10-CM

## 2013-04-07 DIAGNOSIS — I10 Essential (primary) hypertension: Secondary | ICD-10-CM

## 2013-04-07 DIAGNOSIS — I4891 Unspecified atrial fibrillation: Secondary | ICD-10-CM

## 2013-04-07 DIAGNOSIS — I429 Cardiomyopathy, unspecified: Secondary | ICD-10-CM

## 2013-04-07 DIAGNOSIS — Z7901 Long term (current) use of anticoagulants: Secondary | ICD-10-CM

## 2013-04-07 DIAGNOSIS — C50919 Malignant neoplasm of unspecified site of unspecified female breast: Secondary | ICD-10-CM | POA: Diagnosis not present

## 2013-04-07 LAB — POCT INR: INR: 2.3

## 2013-04-07 MED ORDER — VALSARTAN 80 MG PO TABS: 80.0000 mg | ORAL_TABLET | Freq: Every day | ORAL | Status: DC

## 2013-04-07 NOTE — Progress Notes (Signed)
Patient ID: Alicia Harrington, female   DOB: [birth year], 79 y.o.   MRN: [id number]  HPI: Schedule return visit for this very nice woman with chemotherapy induced cardiomyopathy, constant atrial fibrillation and chronic anticoagulation. Since her last visit, she has done fairly well. She continues to travel extensively without great difficulty. She has class 2-3 dyspnea on exertion that troubles her when walking up an incline. For a period of time, she noted weight gain and sensation of increased palpitations. She is also intermittently experienced weight gain and pedal edema. This has resulted in her increasing her dose of furosemide to 40 mg on most days. She has muscle aches, particular in the legs, and wonders whether this is due to one of her medications. Her husband has generated some information suggesting that tamoxifen may be the culprit. Anticoagulation has been generally therapeutic and stable. She was treated with course of prednisone for her DJD of the hands, which resulted in an increased INR.  Current Outpatient Prescriptions  Medication Sig Dispense Refill  . acetaminophen (TYLENOL) 500 MG tablet Take 500 mg by mouth every 6 (six) hours as needed.      . cholecalciferol (VITAMIN D) 1000 UNITS tablet Take 1,000 Units by mouth daily.        . Coenzyme Q10 (CO Q-10) 300 MG CAPS Take 300 mg by mouth daily.        . furosemide (LASIX) 20 MG tablet Take 40 mg by mouth daily. For fluid      . glucosamine-chondroitin 500-400 MG tablet Take 1 tablet by mouth 2 (two) times daily.        Marland Kitchen LANOXIN 0.25 MG tablet TAKE (1) TABLET BY MOUTH ON MONDAY, WED, AND FRI., 1/2 TABLET ON ALL OTHER DAYS.  30 each  6  . metoprolol succinate (TOPROL-XL) 25 MG 24 hr tablet Take 25 mg by mouth daily.        . Multiple Vitamin (MULITIVITAMIN WITH MINERALS) TABS Take 1 tablet by mouth daily.      Marland Kitchen oxyCODONE-acetaminophen (PERCOCET) 5-325 MG per tablet Use 1/2 - 1 PO Q 4 hours as needed for pain  20 tablet  0  .  potassium chloride SA (KLOR-CON M20) 20 MEQ tablet Take 20 mEq by mouth daily. For potassium replacement. Only takes when taking Lasix      . tamoxifen (NOLVADEX) 20 MG tablet Take 20 mg by mouth daily.        . traMADol (ULTRAM) 50 MG tablet Take 50 mg by mouth daily as needed. Only takes if necessary      . warfarin (COUMADIN) 5 MG tablet Take 1 tablet (5 mg total) by mouth daily.  35 tablet  3  . zolpidem (AMBIEN) 5 MG tablet Take 2.5 mg by mouth at bedtime as needed. Sleep      . valsartan (DIOVAN) 80 MG tablet Take 1 tablet (80 mg total) by mouth daily.  30 tablet  6   No current facility-administered medications for this visit.   Allergies  Allergen Reactions  . Altace (Ramipril) Rash     Past medical history, social history, and family history reviewed and updated.  ROS: Denies orthopnea, PND, chest pain. She has mild palpitations and general malaise. All other systems reviewed and are negative.  PHYSICAL EXAM: BP 125/78  Pulse 71  Ht 5\' 1"  (1.549 m)  Wt 56.133 kg (123 lb 12 oz)  BMI 23.39 kg/m2;  Body mass index is 23.39 kg/(m^2). General-Well developed; no acute distress Body habitus-proportionate  weight and height Neck-No JVD; no carotid bruits Lungs-clear lung fields; resonant to percussion Cardiovascular-normal PMI; normal S1 and S2; grade 1-2/6 basilar systolic ejection murmur Abdomen-normal bowel sounds; soft and non-tender without masses or organomegaly Musculoskeletal-No deformities, no cyanosis or clubbing Neurologic-Normal cranial nerves; symmetric strength and tone Skin-Warm, no significant lesions Extremities-1-2+ distal pulses; trace edema  EKG: Atrial fibrillation with controlled ventricular response; ventricular rate of 66 bpm; low-voltage; left anterior fascicular block; delayed R-wave progression. No previous tracing for comparison.  Sayre Bing, MD 04/07/2013  11:11 AM  ASSESSMENT AND PLAN

## 2013-04-07 NOTE — Assessment & Plan Note (Signed)
Patient is asymptomatic with adequate control of heart rate and stable and therapeutic anticoagulation. We will continue current treatment strategy.

## 2013-04-07 NOTE — Progress Notes (Deleted)
Name: Alicia Harrington    DOB: 1932-03-07  Age: 79 y.o.  MR#: [id number]       PCP:  [place] Bing, MD      Insurance: Payor: MEDICARE / Plan: MEDICARE PART A AND B / Product Type: *No Product type* /   CC:   No chief complaint on file.  No list VS Filed Vitals:   04/07/13 1103  BP: 125/78  Pulse: 71  Height: 5\' 1"  (1.549 m)  Weight: 123 lb 12 oz (56.133 kg)    Weights Current Weight  04/07/13 123 lb 12 oz (56.133 kg)  12/28/12 125 lb 6.4 oz (56.881 kg)  10/20/12 123 lb 14.4 oz (56.201 kg)    Blood Pressure  BP Readings from Last 3 Encounters:  04/07/13 125/78  12/28/12 123/66  10/20/12 126/75     Admit date:  (Not on file) Last encounter with RMR:  Visit date not found   Allergy Altace  Current Outpatient Prescriptions  Medication Sig Dispense Refill  . acetaminophen (TYLENOL) 500 MG tablet Take 500 mg by mouth every 6 (six) hours as needed.      . cholecalciferol (VITAMIN D) 1000 UNITS tablet Take 1,000 Units by mouth daily.        . Coenzyme Q10 (CO Q-10) 300 MG CAPS Take 300 mg by mouth daily.        Marland Kitchen DIOVAN 80 MG tablet TAKE ONE TABLET DAILY.  90 each  1  . furosemide (LASIX) 20 MG tablet Take 20-40 mg by mouth as needed. For fluid      . glucosamine-chondroitin 500-400 MG tablet Take 1 tablet by mouth 2 (two) times daily.        Marland Kitchen LANOXIN 0.25 MG tablet TAKE (1) TABLET BY MOUTH ON MONDAY, WED, AND FRI., 1/2 TABLET ON ALL OTHER DAYS.  30 each  6  . metoprolol succinate (TOPROL-XL) 25 MG 24 hr tablet Take 25 mg by mouth daily.        . Multiple Vitamin (MULITIVITAMIN WITH MINERALS) TABS Take 1 tablet by mouth daily.      Marland Kitchen oxyCODONE-acetaminophen (PERCOCET) 5-325 MG per tablet Use 1/2 - 1 PO Q 4 hours as needed for pain  20 tablet  0  . potassium chloride SA (KLOR-CON M20) 20 MEQ tablet Take 20 mEq by mouth as needed. For potassium replacement. Only takes when taking Lasix      . tamoxifen (NOLVADEX) 20 MG tablet Take 20 mg by mouth daily.        . traMADol  (ULTRAM) 50 MG tablet Take 50 mg by mouth daily as needed. Only takes if necessary      . warfarin (COUMADIN) 5 MG tablet Take 1 tablet (5 mg total) by mouth daily.  35 tablet  3  . zolpidem (AMBIEN) 5 MG tablet Take 2.5 mg by mouth at bedtime as needed. Sleep       No current facility-administered medications for this visit.    Discontinued Meds:    Medications Discontinued During This Encounter  Medication Reason  . PredniSONE 10 MG KIT Error    Patient Active Problem List   Diagnosis Date Noted  . Bilateral leg weakness 01/04/2013  . Difficulty in walking 01/04/2013  . Orthostatic hypotension 04/02/2012  . Fasting hyperglycemia 03/30/2012  . Pain in joint, shoulder region 09/24/2011  . Muscle weakness (generalized) 09/24/2011  . Cardiomyopathy   . Sick sinus syndrome   . Chronic kidney disease (CKD), stage I   . Adenocarcinoma, breast   .  DJD (degenerative joint disease)   . History of angina   . Macular degeneration, dry   . Chronic anticoagulation 04/16/2011  . Atrial fibrillation 02/21/2011  . HEARING IMPAIRMENT 06/01/2009  . HYPERTENSION 06/01/2009    LABS    Component Value Date/Time   NA 144 12/27/2012 1102   NA 140 10/05/2012 1050   NA 139 03/29/2012 0447   K 3.3* 12/27/2012 1102   K 4.1 10/05/2012 1050   K 3.6 03/29/2012 0447   CL 107 12/27/2012 1102   CL 103 10/05/2012 1050   CL 97 03/29/2012 0447   CO2 25 12/27/2012 1102   CO2 27 10/05/2012 1050   CO2 23 03/29/2012 0447   GLUCOSE 96 12/27/2012 1102   GLUCOSE 97 10/05/2012 1050   GLUCOSE 181* 03/29/2012 0447   BUN 20 12/27/2012 1102   BUN 20 10/05/2012 1050   BUN 19 03/29/2012 0447   CREATININE 0.95 12/27/2012 1102   CREATININE 0.93 10/05/2012 1050   CREATININE 0.89 03/29/2012 0447   CREATININE 1.00 09/22/2011 1142   CALCIUM 8.9 12/27/2012 1102   CALCIUM 9.9 10/05/2012 1050   CALCIUM 9.4 03/29/2012 0447   GFRNONAA 56* 12/27/2012 1102   GFRNONAA 57* 10/05/2012 1050   GFRNONAA 61* 03/29/2012 0447   GFRAA 65* 12/27/2012 1102    GFRAA 66* 10/05/2012 1050   GFRAA 71* 03/29/2012 0447   CMP     Component Value Date/Time   NA 144 12/27/2012 1102   K 3.3* 12/27/2012 1102   CL 107 12/27/2012 1102   CO2 25 12/27/2012 1102   GLUCOSE 96 12/27/2012 1102   BUN 20 12/27/2012 1102   CREATININE 0.95 12/27/2012 1102   CREATININE 1.00 09/22/2011 1142   CALCIUM 8.9 12/27/2012 1102   PROT 6.7 12/27/2012 1102   ALBUMIN 3.9 12/27/2012 1102   AST 33 12/27/2012 1102   ALT 21 12/27/2012 1102   ALKPHOS 67 12/27/2012 1102   BILITOT 0.5 12/27/2012 1102   GFRNONAA 56* 12/27/2012 1102   GFRAA 65* 12/27/2012 1102       Component Value Date/Time   WBC 4.4 12/27/2012 1102   WBC 4.3 10/05/2012 1050   WBC 8.5 03/29/2012 0447   HGB 13.4 12/27/2012 1102   HGB 13.0 10/05/2012 1050   HGB 13.7 03/29/2012 0447   HCT 39.0 12/27/2012 1102   HCT 37.9 10/05/2012 1050   HCT 40.4 03/29/2012 0447   MCV 102.4* 12/27/2012 1102   MCV 102.7* 10/05/2012 1050   MCV 104.1* 03/29/2012 0447    Lipid Panel     Component Value Date/Time   CHOL  Value: 143        ATP III CLASSIFICATION:  <200     mg/dL   Desirable  161-096  mg/dL   Borderline High  >=045    mg/dL   High 40/98/1191 4782   TRIG 61 09/13/2008 0435   HDL 41 09/13/2008 0435   CHOLHDL 3.5 09/13/2008 0435   VLDL 12 09/13/2008 0435   LDLCALC  Value: 90        Total Cholesterol/HDL:CHD Risk Coronary Heart Disease Risk Table                     Men   Women  1/2 Average Risk   3.4   3.3 09/13/2008 0435    ABG    Component Value Date/Time   PHART 7.508* 09/13/2008 0510   PCO2ART 36.4 09/13/2008 0510   PO2ART 68.6* 09/13/2008 0510   HCO3 28.7* 09/13/2008 0510  TCO2 26.0 09/13/2008 0510   ACIDBASEDEF 1.0 09/12/2008 1700   O2SAT 94.4 09/13/2008 0510     Lab Results  Component Value Date   TSH 0.735 10/20/2011   BNP (last 3 results) No results found for this basename: PROBNP,  in the last 8760 hours Cardiac Panel (last 3 results) No results found for this basename: CKTOTAL, CKMB, TROPONINI, RELINDX,  in the last 72 hours   Iron/TIBC/Ferritin    Component Value Date/Time   IRON 114 04/26/2009 1334   TIBC 268 04/26/2009 1334   FERRITIN 328* 04/26/2009 1334     EKG Orders placed in visit on 04/07/13  . EKG 12-LEAD     Prior Assessment and Plan Problem List as of 04/07/2013   HEARING IMPAIRMENT   HYPERTENSION   Last Assessment & Plan   09/28/2012 Office Visit Written 09/28/2012  4:52 PM by Kathlen Brunswick, MD     All blood pressures recorded since 03/2012 have been normal.    Atrial fibrillation   Last Assessment & Plan   09/28/2012 Office Visit Edited 10/03/2012 10:11 PM by Kathlen Brunswick, MD     Ventricular rate in atrial fibrillation is on the slow side, but patient is asymptomatic, and no adjustment in AV nodal blocking agents as necessary.    Chronic anticoagulation   Last Assessment & Plan   09/28/2012 Office Visit Written 09/28/2012  4:48 PM by Kathlen Brunswick, MD     No evidence for occult GI blood loss.  Patient continues to do well with full anticoagulation with warfarin, which she has tolerated for many years.    Cardiomyopathy   Last Assessment & Plan   04/02/2012 Office Visit Written 04/02/2012 12:24 PM by Kathlen Brunswick, MD     CHF is now compensated, and recovery of left ventricular systolic function was previously documented.  Current therapy will be continued.    Sick sinus syndrome   Chronic kidney disease (CKD), stage I   Last Assessment & Plan   09/28/2012 Office Visit Written 09/28/2012  4:51 PM by Kathlen Brunswick, MD     On diuretic with creatinine of 1.6; subsequently normal, 0.9 in 03/2012    Adenocarcinoma, breast   Last Assessment & Plan   09/22/2011 Office Visit Written 09/22/2011 11:52 AM by Kathlen Brunswick, MD     Patient remains free of apparent recurrence and is followed closely by Dr. Mariel Sleet.    DJD (degenerative joint disease)   Last Assessment & Plan   09/22/2011 Office Visit Edited 09/24/2011  4:14 PM by Kathlen Brunswick, MD     Ultracet 1-2 tabs 3  times a day as needed prescribed for days when she is unable to tolerate Celebrex.  She will discuss with Dr. Karilyn Cota whether he can suggest additional evaluation or treatment that will help her to tolerate nonsteroidals.    History of angina   Macular degeneration, dry   Pain in joint, shoulder region   Last Assessment & Plan   04/02/2012 Office Visit Written 04/02/2012 12:26 PM by Kathlen Brunswick, MD     Patient reports arthralgias and myalgias, which are chronic.  She has tried to discontinue a number of her medications including tamoxifen, but symptoms did not resolve.  She will continue to treat these symptomatically.    Muscle weakness (generalized)   Last Assessment & Plan   09/28/2012 Office Visit Written 09/28/2012  4:53 PM by Kathlen Brunswick, MD     Strength in lower extremities  is normal.  No muscle tenderness nor evidence for myopathy.  Recent normal ESR.  Doubt significant chronic neuromuscular disease-symptomatic treatment recommended.    Fasting hyperglycemia   Orthostatic hypotension   Last Assessment & Plan   04/02/2012 Office Visit Edited 04/02/2012 12:28 PM by Kathlen Brunswick, MD     Patient is significantly orthostatic with typical orthostatic symptoms.  Etiology is likely dehydration related to recent GI illness.  The potential occurrence and danger of syncope were discussed with her and her husband.  She is advised to increase fluid and salt intake and not to walk unassisted until standing blood pressure is greater than 100.  Valsartan and diuretic will be held for a few days until symptoms have resolved and blood pressure has recovered.  Patient requested to call for additional problems.  Otherwise, I will see her again in 6 months.    Bilateral leg weakness   Difficulty in walking       Imaging: No results found.

## 2013-04-07 NOTE — Assessment & Plan Note (Signed)
Patient is concerned that tamoxifen may be causing leg discomfort, but understands the benefit of this agent, and does not wish to discontinue it. I will contact Dr. Mariel Sleet to determine if alternative therapy would be reasonable.

## 2013-04-07 NOTE — Assessment & Plan Note (Addendum)
Patient has tolerated treatment with warfarin without significant adverse effects, with stable and therapeutic anticoagulation and with no evidence for manifest or occult blood loss.  FOBT will be repeated.

## 2013-04-07 NOTE — Assessment & Plan Note (Signed)
Patient continues to have episodes consistent with fluid retention and mild congestive heart failure, but cardiac status is clearly improved.  Current therapy is effective and will be continued.  Patient advised increase furosemide to 80 mg per day if she notes weight gain, particularly if associated with peripheral edema or dyspnea.

## 2013-04-07 NOTE — Assessment & Plan Note (Addendum)
Blood pressure control is excellent with current medication, which will be continued.  Chemistry profile will be reassessed.

## 2013-04-07 NOTE — Patient Instructions (Addendum)
Your physician recommends that you schedule a follow-up appointment in:8 MONTHS  Your physician recommends that you return for lab work in: TODAY (SLIPS GIVEN) CMET   Your Physician recommends that you complete the Hemoccult package given to you today, instructions are enclosed in your packet. Once completed please return to this office.  Your physician has recommended you make the following change in your medication:   1) CHANGE POTASSIUM TO DAILY INSTEAD OF AS NEEDED 2) CHANGE LASIX TO 40MG  DAILY INSTEAD OF AS NEEDED

## 2013-04-08 ENCOUNTER — Telehealth (HOSPITAL_COMMUNITY): Payer: Self-pay

## 2013-04-08 DIAGNOSIS — I4891 Unspecified atrial fibrillation: Secondary | ICD-10-CM | POA: Diagnosis not present

## 2013-04-08 DIAGNOSIS — I10 Essential (primary) hypertension: Secondary | ICD-10-CM | POA: Diagnosis not present

## 2013-04-08 NOTE — Telephone Encounter (Signed)
Per request of Dr. Mariel Sleet, patient instructed to stop Tamoxifen for 4 weeks and let us know if the muscle aches go away.  Verbalizes understanding of instructions.

## 2013-04-09 LAB — COMPREHENSIVE METABOLIC PANEL
ALT: 13 U/L (ref 0–35)
AST: 25 U/L (ref 0–37)
Albumin: 4.1 g/dL (ref 3.5–5.2)
Calcium: 9.4 mg/dL (ref 8.4–10.5)
Chloride: 108 mEq/L (ref 96–112)
Creat: 0.98 mg/dL (ref 0.50–1.10)
Potassium: 4.4 mEq/L (ref 3.5–5.3)

## 2013-04-14 ENCOUNTER — Ambulatory Visit (INDEPENDENT_AMBULATORY_CARE_PROVIDER_SITE_OTHER): Payer: Medicare Other | Admitting: *Deleted

## 2013-04-14 DIAGNOSIS — I4891 Unspecified atrial fibrillation: Secondary | ICD-10-CM | POA: Diagnosis not present

## 2013-04-14 DIAGNOSIS — Z7901 Long term (current) use of anticoagulants: Secondary | ICD-10-CM

## 2013-04-14 DIAGNOSIS — H903 Sensorineural hearing loss, bilateral: Secondary | ICD-10-CM | POA: Diagnosis not present

## 2013-04-14 DIAGNOSIS — H8020 Cochlear otosclerosis, unspecified ear: Secondary | ICD-10-CM | POA: Diagnosis not present

## 2013-04-14 DIAGNOSIS — H905 Unspecified sensorineural hearing loss: Secondary | ICD-10-CM | POA: Diagnosis not present

## 2013-04-26 ENCOUNTER — Ambulatory Visit (INDEPENDENT_AMBULATORY_CARE_PROVIDER_SITE_OTHER): Payer: Medicare Other | Admitting: *Deleted

## 2013-04-26 DIAGNOSIS — Z7901 Long term (current) use of anticoagulants: Secondary | ICD-10-CM | POA: Diagnosis not present

## 2013-04-26 LAB — POC HEMOCCULT BLD/STL (HOME/3-CARD/SCREEN): Card #3 Fecal Occult Blood, POC: NEGATIVE

## 2013-04-28 ENCOUNTER — Encounter: Payer: Self-pay | Admitting: *Deleted

## 2013-05-04 DIAGNOSIS — I4891 Unspecified atrial fibrillation: Secondary | ICD-10-CM | POA: Diagnosis not present

## 2013-05-04 DIAGNOSIS — Z7901 Long term (current) use of anticoagulants: Secondary | ICD-10-CM | POA: Diagnosis not present

## 2013-05-05 LAB — POCT INR: INR: 2.5

## 2013-05-06 ENCOUNTER — Telehealth (HOSPITAL_COMMUNITY): Payer: Self-pay

## 2013-05-06 NOTE — Telephone Encounter (Signed)
Call from [place].  States "I've been off the tamoxifen for 4 weeks and the muscle aches have not changed significantly.  Should I restart the tamoxifen?"  Discussed with Dellis Anes PA-C and patient advised to resume taking the tamoxifen.  Patient and her husband agree with the decision and she will resume it.

## 2013-05-09 ENCOUNTER — Ambulatory Visit (INDEPENDENT_AMBULATORY_CARE_PROVIDER_SITE_OTHER): Payer: Medicare Other | Admitting: *Deleted

## 2013-05-09 DIAGNOSIS — I4891 Unspecified atrial fibrillation: Secondary | ICD-10-CM

## 2013-05-09 DIAGNOSIS — Z7901 Long term (current) use of anticoagulants: Secondary | ICD-10-CM

## 2013-05-26 ENCOUNTER — Ambulatory Visit (INDEPENDENT_AMBULATORY_CARE_PROVIDER_SITE_OTHER): Payer: Medicare Other | Admitting: *Deleted

## 2013-05-26 DIAGNOSIS — I4891 Unspecified atrial fibrillation: Secondary | ICD-10-CM

## 2013-05-26 DIAGNOSIS — Z7901 Long term (current) use of anticoagulants: Secondary | ICD-10-CM | POA: Diagnosis not present

## 2013-06-09 ENCOUNTER — Ambulatory Visit (INDEPENDENT_AMBULATORY_CARE_PROVIDER_SITE_OTHER): Payer: Medicare Other | Admitting: *Deleted

## 2013-06-09 DIAGNOSIS — Z7901 Long term (current) use of anticoagulants: Secondary | ICD-10-CM

## 2013-06-09 DIAGNOSIS — I4891 Unspecified atrial fibrillation: Secondary | ICD-10-CM

## 2013-06-23 ENCOUNTER — Ambulatory Visit (INDEPENDENT_AMBULATORY_CARE_PROVIDER_SITE_OTHER): Payer: Medicare Other | Admitting: *Deleted

## 2013-06-23 DIAGNOSIS — Z7901 Long term (current) use of anticoagulants: Secondary | ICD-10-CM | POA: Diagnosis not present

## 2013-06-23 DIAGNOSIS — I4891 Unspecified atrial fibrillation: Secondary | ICD-10-CM | POA: Diagnosis not present

## 2013-06-23 LAB — POCT INR: INR: 2.3

## 2013-06-24 ENCOUNTER — Encounter (HOSPITAL_COMMUNITY): Payer: Medicare Other | Attending: Hematology and Oncology

## 2013-06-24 DIAGNOSIS — I509 Heart failure, unspecified: Secondary | ICD-10-CM | POA: Diagnosis not present

## 2013-06-24 DIAGNOSIS — M1990 Unspecified osteoarthritis, unspecified site: Secondary | ICD-10-CM

## 2013-06-24 DIAGNOSIS — Z09 Encounter for follow-up examination after completed treatment for conditions other than malignant neoplasm: Secondary | ICD-10-CM | POA: Insufficient documentation

## 2013-06-24 DIAGNOSIS — C50919 Malignant neoplasm of unspecified site of unspecified female breast: Secondary | ICD-10-CM

## 2013-06-24 DIAGNOSIS — Z853 Personal history of malignant neoplasm of breast: Secondary | ICD-10-CM | POA: Diagnosis not present

## 2013-06-24 LAB — CBC WITH DIFFERENTIAL/PLATELET
Basophils Absolute: 0 10*3/uL (ref 0.0–0.1)
Basophils Relative: 1 % (ref 0–1)
Eosinophils Absolute: 0.1 10*3/uL (ref 0.0–0.7)
Eosinophils Relative: 3 % (ref 0–5)
HCT: 40.1 % (ref 36.0–46.0)
MCH: 34.4 pg — ABNORMAL HIGH (ref 26.0–34.0)
MCHC: 34.4 g/dL (ref 30.0–36.0)
Monocytes Absolute: 0.6 10*3/uL (ref 0.1–1.0)
Neutro Abs: 2.9 10*3/uL (ref 1.7–7.7)
RDW: 13.2 % (ref 11.5–15.5)

## 2013-06-24 LAB — COMPREHENSIVE METABOLIC PANEL
AST: 28 U/L (ref 0–37)
Albumin: 3.7 g/dL (ref 3.5–5.2)
Calcium: 9.2 mg/dL (ref 8.4–10.5)
Chloride: 101 mEq/L (ref 96–112)
Creatinine, Ser: 0.86 mg/dL (ref 0.50–1.10)
Total Protein: 6.8 g/dL (ref 6.0–8.3)

## 2013-06-24 NOTE — Progress Notes (Signed)
Labs drawn today for cbc/diff,cmp 

## 2013-06-26 ENCOUNTER — Other Ambulatory Visit: Payer: Self-pay | Admitting: Cardiology

## 2013-06-27 ENCOUNTER — Encounter (HOSPITAL_COMMUNITY): Payer: Self-pay | Admitting: Oncology

## 2013-06-27 ENCOUNTER — Encounter (HOSPITAL_BASED_OUTPATIENT_CLINIC_OR_DEPARTMENT_OTHER): Payer: Medicare Other

## 2013-06-27 VITALS — BP 143/72 | HR 52 | Temp 97.7°F | Resp 16 | Wt 124.2 lb

## 2013-06-27 DIAGNOSIS — C773 Secondary and unspecified malignant neoplasm of axilla and upper limb lymph nodes: Secondary | ICD-10-CM

## 2013-06-27 DIAGNOSIS — I4891 Unspecified atrial fibrillation: Secondary | ICD-10-CM | POA: Diagnosis not present

## 2013-06-27 DIAGNOSIS — Z170 Estrogen receptor positive status [ER+]: Secondary | ICD-10-CM | POA: Diagnosis not present

## 2013-06-27 DIAGNOSIS — C50919 Malignant neoplasm of unspecified site of unspecified female breast: Secondary | ICD-10-CM | POA: Diagnosis not present

## 2013-06-27 DIAGNOSIS — R102 Pelvic and perineal pain unspecified side: Secondary | ICD-10-CM

## 2013-06-27 DIAGNOSIS — C50911 Malignant neoplasm of unspecified site of right female breast: Secondary | ICD-10-CM

## 2013-06-27 MED ORDER — ANASTROZOLE 1 MG PO TABS: 1.0000 mg | ORAL_TABLET | Freq: Every day | ORAL | Status: DC

## 2013-06-27 NOTE — Patient Instructions (Addendum)
[facility] Discharge Instructions  RECOMMENDATIONS MADE BY THE CONSULTANT AND ANY TEST RESULTS WILL BE SENT TO YOUR REFERRING PHYSICIAN.  EXAM FINDINGS BY THE PHYSICIAN TODAY AND SIGNS OR SYMPTOMS TO REPORT TO CLINIC OR PRIMARY PHYSICIAN: Exam findings as discussed by Dr. Sherrlyn Hock.  MEDICATIONS PRESCRIBED:  1.  Anastrozole as prescribed - start taking on August 11th, 2014 after you have been off Tamoxifen for 1 week.  SPECIAL INSTRUCTIONS/FOLLOW-UP: 1.  Stop taking your Tamoxifen today and for one week - begin taking Anastrozole as prescribed on August 11th, 2014. 2.  You will be scheduled for a pelvic ultrasound to evaluate the pelvic bloating you have reported. 3.  Please follow-up with your PCP, Dr. Ouida Sills, to have a baseline bone density exam ordered. 4.  You may include vitamin B12 in your daily vitamin regimen to potentially help with your peripheral neuropathy symptoms. 5.  Please return in 6 months as scheduled for repeat labwork (CBC, CMET, Ca 27.29) and an office visit to see the MD. 6.  Continue with your regular mammograms as scheduled.  Thank you for choosing [facility] to provide your oncology and hematology care.  To afford each patient quality time with our providers, please arrive at least 15 minutes before your scheduled appointment time.  With your help, our goal is to use those 15 minutes to complete the necessary work-up to ensure our physicians have the information they need to help with your evaluation and healthcare recommendations.    Effective January 1st, 2014, we ask that you re-schedule your appointment with our physicians should you arrive 10 or more minutes late for your appointment.  We strive to give you quality time with our providers, and arriving late affects you and other patients whose appointments are after yours.    Again, thank you for choosing [facility].  Our hope is that these requests will decrease the  amount of time that you wait before being seen by our physicians.       _____________________________________________________________  Should you have questions after your visit to [facility], please contact our office at [phone] between the hours of 8:30 a.m. and 5:00 p.m.  Voicemails left after 4:30 p.m. will not be returned until the following business day.  For prescription refill requests, have your pharmacy contact our office with your prescription refill request.     Anastrozole tablets What is this medicine? ANASTROZOLE (an AS troe zole) is used to treat breast cancer in women who have gone through menopause. Some types of breast cancer depend on estrogen to grow, and this medicine can stop tumor growth by blocking estrogen production. This medicine may be used for other purposes; ask your health care provider or pharmacist if you have questions. What should I tell my health care provider before I take this medicine? They need to know if you have any of these conditions: -liver disease -an unusual or allergic reaction to anastrozole, other medicines, foods, dyes, or preservatives -pregnant or trying to get pregnant -breast-feeding How should I use this medicine? Take this medicine by mouth with a glass of water. Follow the directions on the prescription label. You can take this medicine with or without food. Take your doses at regular intervals. Do not take your medicine more often than directed. Do not stop taking except on the advice of your doctor or health care professional. Talk to your pediatrician regarding the use of this medicine in children. Special  care may be needed. Overdosage: If you think you have taken too much of this medicine contact a poison control center or emergency room at once. NOTE: This medicine is only for you. Do not share this medicine with others. What if I miss a dose? If you miss a dose, take it as soon as you can. If it is almost  time for your next dose, take only that dose. Do not take double or extra doses. What may interact with this medicine? Do not take this medicine with any of the following medications: -female hormones, like estrogens or progestins and birth control pills This medicine may also interact with the following medications: -tamoxifen This list may not describe all possible interactions. Give your health care provider a list of all the medicines, herbs, non-prescription drugs, or dietary supplements you use. Also tell them if you smoke, drink alcohol, or use illegal drugs. Some items may interact with your medicine. What should I watch for while using this medicine? Visit your doctor or health care professional for regular checks on your progress. Let your doctor or health care professional know about any unusual vaginal bleeding. Do not treat yourself for diarrhea, nausea, vomiting or other side effects. Ask your doctor or health care professional for advice. What side effects may I notice from receiving this medicine? Side effects that you should report to your doctor or health care professional as soon as possible: -allergic reactions like skin rash, itching or hives, swelling of the face, lips, or tongue -any new or unusual symptoms -breathing problems -chest pain -leg pain or swelling -vomiting Side effects that usually do not require medical attention (report to your doctor or health care professional if they continue or are bothersome): -back or bone pain -cough, or throat infection -diarrhea or constipation -dizziness -headache -hot flashes -loss of appetite -nausea -sweating -weakness and tiredness -weight gain This list may not describe all possible side effects. Call your doctor for medical advice about side effects. You may report side effects to FDA at 1-800-FDA-1088. Where should I keep my medicine? Keep out of the reach of children. Store at room temperature between 20 and 25  degrees C (68 and 77 degrees F). Throw away any unused medicine after the expiration date. NOTE: This sheet is a summary. It may not cover all possible information. If you have questions about this medicine, talk to your doctor, pharmacist, or health care provider.  2012, [employer] Standard. (01/21/2008 4:31:52 PM)

## 2013-06-27 NOTE — Progress Notes (Signed)
Primary Physician: Dr. Kenton Bing, MD  DIAGNOSIS:  Stage II (T2, N1, M0) high-grade infiltrating ductal carcinoma the right breast, 3.5 cm in size, clinically node-positive and biopsy proven have right axillary lymph node involvement diagnosed on 04/26/2008. This was a grade 3 cancer with LV I., ER +99%, PR +100%, HER-2/neu 3+ positive with a Ki-67 marker high at 45%. She did participate in NSABP-41 trial and was randomized to Adriamycin/Cytoxan every 3 weeks for 4 cycles followed by Taxol and trastuzumab. She was unable to tolerate a trastuzumab due to congestive heart failure. She had a total of 7 treatments of the Taxol for developing severe weakness, fatigue, and grade 1 peripheral neuropathy. She has minimal symptoms down her toes at best. She is now on tamoxifen 20 mg a day which started in June 2010.   CURRENT THERAPY: On hormonal therapy with tamoxifen    HPI:  Patient returns for continued oncology evaluation for surveillance of breast cancer history as described above. She was seen here about 5-6 months ago. Overall states that she is doing steady, states that she continues to have problems tolerating tamoxifen. She does have some chronic arthritis and stiffness in the joints of both hands and muscle pains especially in her thighs which she attributes to tamoxifen, states that she did hold tamoxifen for a month in June and restarted and did notice some improvement in her muscle pains. She also complains of pelvic bloating, denies any abnormal vaginal bleeding. She has chronic low back pain. Appetite fluctuates, denies any unintentional weight loss. Denies feeling any new breast masses on self-exam. Remains physically active and ambulatory.  No headaches, imbalance or falls. Denies any h/o thromboembolic phenomena since last visit here, she is on Coumadin anticoagulation for atrial fibrillation. No new mood disturbances.  Past Medical History/Past Surgical History:  #1 stage II (T2, N1, M0)  high-grade infiltrating ductal carcinoma the right breast, 3.5 cm in size, clinically node-positive and biopsy proven have right axillary lymph node involvement diagnosed on 04/26/2008. This was a grade 3 cancer with LV I., ER +99%, PR +100%, HER-2/neu 3+ positive with a Ki-67 marker high at 45%. She did participate in NSABP-41 trial and was randomized to Adriamycin/Cytoxan every 3 weeks for 4 cycles followed by Taxol and trastuzumab. She was unable to tolerate a trastuzumab due to congestive heart failure. She had a total of 7 treatments of the Taxol for developing severe weakness, fatigue, and grade 1 peripheral neuropathy. She has minimal symptoms down her toes at best. She is now on tamoxifen 20 mg a day which started in June 2010. #2 atrial fibrillation on Coumadin #3 CHF #4 hypokalemia #5 degenerative joint disease of the back status post disc surgery by Dr. Lovell Sheehan  #6 mild scoliosis with chronic left-sided back discomfort #7 right shoulder degenerative joint disease with limited range of motion #8 degenerative joint disease versus Lyme disease #9 reactive depression from the death of her son several years ago   Allergies: Altace   Medications acetaminophen (TYLENOL) 500 MG tablet 500 mg, Every 6 hours PRN, Tamoxifen 20 mg daily, cholecalciferol (VITAMIN D) 1000 UNITS tablet 1,000 Units, Daily Coenzyme Q10 (CO Q-10) 300 MG CAPS 300 mg, Daily furosemide (LASIX) 20 MG tablet 40 mg, Daily glucosamine-chondroitin 500-400 MG tablet 1 tablet, 2 times daily LANOXIN 0.25 MG tablet metoprolol succinate (TOPROL-XL) 25 MG 24 hr tablet 25 mg, Daily Multiple Vitamin (MULITIVITAMIN WITH MINERALS) TABS 1 tablet, Daily oxyCODONE-acetaminophen (PERCOCET) 5-325 MG per tablet potassium chloride SA (KLOR-CON M20) 20 MEQ  tablet 20 mEq, Daily tamoxifen (NOLVADEX) 20 MG tablet 20 mg, Daily traMADol (ULTRAM) 50 MG tablet 50 mg, Daily PRN valsartan (DIOVAN) 80 MG tablet 80 mg, Daily warfarin (COUMADIN) 5 MG tablet 5 mg,  Daily zolpidem (AMBIEN) 5 MG tablet 2.5 mg, At bedtime PRN  Family History/Social History - noncontributory   ROS: as in HPI above. In addition, denies fevers or night sweats. Denies any headaches, dizziness, double vision, fevers, chills, night sweats, nausea, vomiting, diarrhea, constipation, chest pain, heart palpitations, shortness of breath, blood in stool, black tarry stool, urinary pain, urinary burning, urinary frequency, hematuria. PS ECOG 1.   PHYSICAL EXAMINATION  GENERAL: Patient is alert and oriented, in no acute distress. No icterus or pallor. VITALS: 140/72, 52, 16, 97.7, weight 124.2 pounds  HEENT : EOMs intact. No cervical or supraclavicular adenopathy.  CVS : S1S2, irregular rate and rhythm  LUNGS : Bilaterally clear to auscultation, no rhonchi  ABDOMEN: Soft, nontender, no hepatomegaly or masses palpable clinically.  EXTREMITIES: Bilateral trace pedal edema. No cyanosis  BREAST: Thickening/scar tissue palpable in the right breast at the site of prior surgery, no dominant masses otherwise. No abnormal masses palpable in the left breast. No axillary adenopathy on either side. Exam performed in presence of a nurse.    LABORATORY DATA: from today are pending.  Mammogram March 2014 reported unremarkable, BI-RADS 2   ASSESSMENT / PLAN:  H/o Stage II (T2, N1, M0) high-grade infiltrating ductal carcinoma the right breast, 3.5 cm in size, clinically node-positive and biopsy proven have right axillary lymph node involvement diagnosed on 04/26/2008. This was a grade 3 cancer with LV I., ER +99%, PR +100%, HER-2/neu 3+ positive with a Ki-67 marker high at 45%. She did participate in NSABP-41 trial and was randomized to Adriamycin/Cytoxan every 3 weeks for 4 cycles followed by Taxol and trastuzumab. She was unable to tolerate a trastuzumab due to congestive heart failure. She had a total of 7 treatments of the Taxol for developing severe weakness, fatigue, and grade 1 peripheral  neuropathy. She has minimal symptoms down her toes at best. She is now on tamoxifen 20 mg a day which started in June 2010  -   patient here for continued followup, clinically no evidence to suggest recurrent/metastatic breast cancer. She continues to have difficulty tolerating tamoxifen especially muscle pains causing difficulty in sitting and ambulating. Have discussed other hormonal therapy options for breast cancer including aromatase inhibitor therapy and discussed overall side effect profile. She is willing to switch to anastrozole at this time and gave verbal consent for this treatment change. Will therefore stop tamoxifen and start anastrozole 1 mg by mouth daily at 1 week from now. Have advised her to notify if she develops any worsening arthralgias or joint stiffness. Will also request her primary physician to get baseline DEXA scan since she states that she has not had one for about 5-6 years now. For pelvic symptoms, will get pelvic ultrasound to evaluate for any obvious uterine/adnexal abnormality, patient's husband is a retired [employer] fund[employer] and they are agreeable to this plan. She was advised to continued calcium plus vitamin D supplement twice daily. Otherwise she will return for followup in 6 months with repeat labs. In between visits, patient advised to call in case of any new side effects from anastrozole, breast masses felt on self-exam, or other new symptoms and will need to be evaluated sooner. Patient and husband present are agreeable to this plan.  Janese Banks, MD

## 2013-06-29 ENCOUNTER — Other Ambulatory Visit: Payer: Self-pay

## 2013-07-08 ENCOUNTER — Ambulatory Visit (HOSPITAL_COMMUNITY)
Admission: RE | Admit: 2013-07-08 | Discharge: 2013-07-08 | Disposition: A | Discharge status: Home / Self Care | Payer: Medicare Other | Source: Ambulatory Visit | Attending: Oncology | Admitting: Oncology

## 2013-07-08 ENCOUNTER — Other Ambulatory Visit (HOSPITAL_COMMUNITY): Payer: Self-pay | Admitting: Oncology

## 2013-07-08 DIAGNOSIS — C50919 Malignant neoplasm of unspecified site of unspecified female breast: Secondary | ICD-10-CM | POA: Diagnosis not present

## 2013-07-08 DIAGNOSIS — R102 Pelvic and perineal pain: Secondary | ICD-10-CM

## 2013-07-08 DIAGNOSIS — D259 Leiomyoma of uterus, unspecified: Secondary | ICD-10-CM | POA: Diagnosis not present

## 2013-07-08 DIAGNOSIS — N959 Unspecified menopausal and perimenopausal disorder: Secondary | ICD-10-CM | POA: Insufficient documentation

## 2013-07-08 DIAGNOSIS — N83209 Unspecified ovarian cyst, unspecified side: Secondary | ICD-10-CM | POA: Diagnosis not present

## 2013-07-11 ENCOUNTER — Telehealth (HOSPITAL_COMMUNITY): Payer: Self-pay

## 2013-07-11 ENCOUNTER — Ambulatory Visit (INDEPENDENT_AMBULATORY_CARE_PROVIDER_SITE_OTHER): Payer: Medicare Other | Admitting: *Deleted

## 2013-07-11 DIAGNOSIS — I4891 Unspecified atrial fibrillation: Secondary | ICD-10-CM | POA: Diagnosis not present

## 2013-07-11 DIAGNOSIS — Z7901 Long term (current) use of anticoagulants: Secondary | ICD-10-CM | POA: Diagnosis not present

## 2013-07-11 LAB — POCT INR: INR: 2.7

## 2013-08-01 ENCOUNTER — Telehealth: Payer: Self-pay | Admitting: *Deleted

## 2013-08-01 NOTE — Telephone Encounter (Signed)
Pt notes she is getting ready to go out of town until October and will need to have her digoxin, toprol and tramadol 90 day supply via [facility] in [place], this nurse advised that tramadol is not a standard cardiology managed medication and that I will ask the doctor to consider filling however please be aware she will more than likely need to have her PCP manage and fill this medication going forward, pt understood, please advise if ok to fill medications for this pt per upcoming apt not due until 12-02-2013, pt up to date for coumadin check as advised

## 2013-08-01 NOTE — Telephone Encounter (Signed)
PT needs to speak with nurse about medications

## 2013-08-02 NOTE — Telephone Encounter (Signed)
Permissible to refill cardiovascular medications only. Will defer to patient's PCP with regards to Tramadol.

## 2013-08-02 NOTE — Telephone Encounter (Signed)
Spoke to pt husband to advise results/instructions. Pt husband understood.

## 2013-08-05 DIAGNOSIS — D1801 Hemangioma of skin and subcutaneous tissue: Secondary | ICD-10-CM | POA: Diagnosis not present

## 2013-08-05 DIAGNOSIS — L821 Other seborrheic keratosis: Secondary | ICD-10-CM | POA: Diagnosis not present

## 2013-08-05 DIAGNOSIS — D239 Other benign neoplasm of skin, unspecified: Secondary | ICD-10-CM | POA: Diagnosis not present

## 2013-08-05 DIAGNOSIS — L819 Disorder of pigmentation, unspecified: Secondary | ICD-10-CM | POA: Diagnosis not present

## 2013-08-08 ENCOUNTER — Ambulatory Visit (INDEPENDENT_AMBULATORY_CARE_PROVIDER_SITE_OTHER): Payer: Medicare Other | Admitting: *Deleted

## 2013-08-08 DIAGNOSIS — I4891 Unspecified atrial fibrillation: Secondary | ICD-10-CM

## 2013-08-08 DIAGNOSIS — Z7901 Long term (current) use of anticoagulants: Secondary | ICD-10-CM

## 2013-08-08 LAB — POCT INR: INR: 2.3

## 2013-08-09 DIAGNOSIS — Z23 Encounter for immunization: Secondary | ICD-10-CM | POA: Diagnosis not present

## 2013-09-19 ENCOUNTER — Ambulatory Visit (INDEPENDENT_AMBULATORY_CARE_PROVIDER_SITE_OTHER): Payer: Medicare Other | Admitting: *Deleted

## 2013-09-19 DIAGNOSIS — Z7901 Long term (current) use of anticoagulants: Secondary | ICD-10-CM

## 2013-09-19 DIAGNOSIS — I4891 Unspecified atrial fibrillation: Secondary | ICD-10-CM | POA: Diagnosis not present

## 2013-09-19 LAB — POCT INR: INR: 1.9

## 2013-09-24 ENCOUNTER — Other Ambulatory Visit: Payer: Self-pay | Admitting: Cardiology

## 2013-09-29 ENCOUNTER — Other Ambulatory Visit: Payer: Self-pay

## 2013-09-29 ENCOUNTER — Other Ambulatory Visit (HOSPITAL_COMMUNITY): Payer: Self-pay | Admitting: Internal Medicine

## 2013-09-29 DIAGNOSIS — Z780 Asymptomatic menopausal state: Secondary | ICD-10-CM

## 2013-09-29 DIAGNOSIS — I509 Heart failure, unspecified: Secondary | ICD-10-CM | POA: Diagnosis not present

## 2013-09-29 DIAGNOSIS — I4891 Unspecified atrial fibrillation: Secondary | ICD-10-CM | POA: Diagnosis not present

## 2013-09-29 DIAGNOSIS — C50919 Malignant neoplasm of unspecified site of unspecified female breast: Secondary | ICD-10-CM | POA: Diagnosis not present

## 2013-10-04 ENCOUNTER — Ambulatory Visit (HOSPITAL_COMMUNITY)
Admission: RE | Admit: 2013-10-04 | Discharge: 2013-10-04 | Disposition: A | Discharge status: Home / Self Care | Payer: Medicare Other | Source: Ambulatory Visit | Attending: Internal Medicine | Admitting: Internal Medicine

## 2013-10-04 DIAGNOSIS — Z780 Asymptomatic menopausal state: Secondary | ICD-10-CM | POA: Diagnosis not present

## 2013-10-04 DIAGNOSIS — M899 Disorder of bone, unspecified: Secondary | ICD-10-CM | POA: Insufficient documentation

## 2013-10-10 ENCOUNTER — Telehealth (INDEPENDENT_AMBULATORY_CARE_PROVIDER_SITE_OTHER): Payer: Self-pay | Admitting: *Deleted

## 2013-10-10 ENCOUNTER — Ambulatory Visit (INDEPENDENT_AMBULATORY_CARE_PROVIDER_SITE_OTHER): Payer: Medicare Other | Admitting: *Deleted

## 2013-10-10 DIAGNOSIS — Z7901 Long term (current) use of anticoagulants: Secondary | ICD-10-CM | POA: Diagnosis not present

## 2013-10-10 DIAGNOSIS — I4891 Unspecified atrial fibrillation: Secondary | ICD-10-CM | POA: Diagnosis not present

## 2013-10-10 LAB — POCT INR: INR: 2.1

## 2013-10-10 NOTE — Telephone Encounter (Signed)
Voice mail left at 2:07  He requests that Dr. Karilyn Cota call back.  He has question about her pathology report on her last TCS.  Attached results   Notes Recorded by Simone Curia on 02/19/2012 at 9:03 AM 5 yr TCS noted, procedure note and pathology result letter faxed to PCP  Notes Recorded by Simone Curia on 02/18/2012 at 9:07 AM Chart given to Dr Karilyn Cota Notes Recorded by Malissa Hippo, MD on 02/14/2012 at 11:41 AM Results of biopsy reviewed with Jamesetta So and her husband Dr. Kate Sable. 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 Ann, please obtain Dr. Lonia Mad GI records from [facility]. Please biopsy report to Dr. Dietrich Pates    Specimen Collected: 02/11/12 12:00 AM Last Resulted: 02/12/12 12:00 AM

## 2013-10-11 NOTE — Telephone Encounter (Signed)
I returned Dr. Kate Sable call yesterday. Alicia Harrington's next colonoscopy would be in 2018.

## 2013-10-25 DIAGNOSIS — H3530 Unspecified macular degeneration: Secondary | ICD-10-CM | POA: Diagnosis not present

## 2013-10-25 DIAGNOSIS — H2510 Age-related nuclear cataract, unspecified eye: Secondary | ICD-10-CM | POA: Diagnosis not present

## 2013-10-25 DIAGNOSIS — Z961 Presence of intraocular lens: Secondary | ICD-10-CM | POA: Diagnosis not present

## 2013-10-26 ENCOUNTER — Other Ambulatory Visit: Payer: Self-pay | Admitting: Dermatology

## 2013-10-26 DIAGNOSIS — D485 Neoplasm of uncertain behavior of skin: Secondary | ICD-10-CM | POA: Diagnosis not present

## 2013-10-26 DIAGNOSIS — B079 Viral wart, unspecified: Secondary | ICD-10-CM | POA: Diagnosis not present

## 2013-10-26 DIAGNOSIS — L821 Other seborrheic keratosis: Secondary | ICD-10-CM | POA: Diagnosis not present

## 2013-11-03 ENCOUNTER — Ambulatory Visit (INDEPENDENT_AMBULATORY_CARE_PROVIDER_SITE_OTHER): Payer: Medicare Other | Admitting: *Deleted

## 2013-11-03 DIAGNOSIS — I4891 Unspecified atrial fibrillation: Secondary | ICD-10-CM

## 2013-11-03 DIAGNOSIS — Z7901 Long term (current) use of anticoagulants: Secondary | ICD-10-CM | POA: Diagnosis not present

## 2013-11-03 LAB — POCT INR: INR: 2.5

## 2013-12-01 DIAGNOSIS — Z5181 Encounter for therapeutic drug level monitoring: Secondary | ICD-10-CM | POA: Diagnosis not present

## 2013-12-01 DIAGNOSIS — I4891 Unspecified atrial fibrillation: Secondary | ICD-10-CM | POA: Diagnosis not present

## 2013-12-01 DIAGNOSIS — Z7901 Long term (current) use of anticoagulants: Secondary | ICD-10-CM | POA: Diagnosis not present

## 2013-12-01 LAB — PROTIME-INR: INR: 2.1 — AB (ref 0.9–1.1)

## 2013-12-02 ENCOUNTER — Telehealth: Payer: Self-pay | Admitting: *Deleted

## 2013-12-02 ENCOUNTER — Ambulatory Visit (INDEPENDENT_AMBULATORY_CARE_PROVIDER_SITE_OTHER): Payer: Medicare Other | Admitting: *Deleted

## 2013-12-02 DIAGNOSIS — Z7901 Long term (current) use of anticoagulants: Secondary | ICD-10-CM

## 2013-12-02 DIAGNOSIS — I4891 Unspecified atrial fibrillation: Secondary | ICD-10-CM

## 2013-12-02 NOTE — Telephone Encounter (Signed)
See coumadin note from today.

## 2013-12-02 NOTE — Telephone Encounter (Signed)
Pt states she had PT Inr done yesterday. She is calling to get results and directions

## 2013-12-22 ENCOUNTER — Other Ambulatory Visit: Payer: Self-pay | Admitting: *Deleted

## 2013-12-28 ENCOUNTER — Other Ambulatory Visit (HOSPITAL_COMMUNITY): Payer: Medicare Other

## 2013-12-30 ENCOUNTER — Encounter (HOSPITAL_COMMUNITY): Payer: Self-pay

## 2013-12-30 ENCOUNTER — Encounter (HOSPITAL_COMMUNITY): Payer: Medicare Other | Attending: Hematology and Oncology

## 2013-12-30 ENCOUNTER — Encounter (HOSPITAL_BASED_OUTPATIENT_CLINIC_OR_DEPARTMENT_OTHER): Payer: Medicare Other

## 2013-12-30 VITALS — BP 148/72 | HR 74 | Temp 97.5°F | Resp 18 | Wt 117.2 lb

## 2013-12-30 DIAGNOSIS — Z09 Encounter for follow-up examination after completed treatment for conditions other than malignant neoplasm: Secondary | ICD-10-CM | POA: Diagnosis not present

## 2013-12-30 DIAGNOSIS — Z853 Personal history of malignant neoplasm of breast: Secondary | ICD-10-CM | POA: Insufficient documentation

## 2013-12-30 DIAGNOSIS — H3540 Unspecified peripheral retinal degeneration: Secondary | ICD-10-CM | POA: Insufficient documentation

## 2013-12-30 DIAGNOSIS — N181 Chronic kidney disease, stage 1: Secondary | ICD-10-CM | POA: Diagnosis not present

## 2013-12-30 DIAGNOSIS — C50919 Malignant neoplasm of unspecified site of unspecified female breast: Secondary | ICD-10-CM

## 2013-12-30 DIAGNOSIS — I428 Other cardiomyopathies: Secondary | ICD-10-CM | POA: Diagnosis not present

## 2013-12-30 DIAGNOSIS — G609 Hereditary and idiopathic neuropathy, unspecified: Secondary | ICD-10-CM | POA: Diagnosis not present

## 2013-12-30 DIAGNOSIS — B078 Other viral warts: Secondary | ICD-10-CM | POA: Insufficient documentation

## 2013-12-30 DIAGNOSIS — M4120 Other idiopathic scoliosis, site unspecified: Secondary | ICD-10-CM | POA: Diagnosis not present

## 2013-12-30 DIAGNOSIS — I4891 Unspecified atrial fibrillation: Secondary | ICD-10-CM | POA: Diagnosis not present

## 2013-12-30 DIAGNOSIS — C50911 Malignant neoplasm of unspecified site of right female breast: Secondary | ICD-10-CM

## 2013-12-30 DIAGNOSIS — M1990 Unspecified osteoarthritis, unspecified site: Secondary | ICD-10-CM

## 2013-12-30 DIAGNOSIS — Z7901 Long term (current) use of anticoagulants: Secondary | ICD-10-CM | POA: Insufficient documentation

## 2013-12-30 DIAGNOSIS — M19019 Primary osteoarthritis, unspecified shoulder: Secondary | ICD-10-CM | POA: Insufficient documentation

## 2013-12-30 DIAGNOSIS — I509 Heart failure, unspecified: Secondary | ICD-10-CM | POA: Insufficient documentation

## 2013-12-30 DIAGNOSIS — B079 Viral wart, unspecified: Secondary | ICD-10-CM | POA: Insufficient documentation

## 2013-12-30 DIAGNOSIS — I129 Hypertensive chronic kidney disease with stage 1 through stage 4 chronic kidney disease, or unspecified chronic kidney disease: Secondary | ICD-10-CM | POA: Diagnosis not present

## 2013-12-30 DIAGNOSIS — Z87891 Personal history of nicotine dependence: Secondary | ICD-10-CM | POA: Diagnosis not present

## 2013-12-30 DIAGNOSIS — M549 Dorsalgia, unspecified: Secondary | ICD-10-CM | POA: Diagnosis not present

## 2013-12-30 DIAGNOSIS — M129 Arthropathy, unspecified: Secondary | ICD-10-CM | POA: Insufficient documentation

## 2013-12-30 DIAGNOSIS — I495 Sick sinus syndrome: Secondary | ICD-10-CM

## 2013-12-30 DIAGNOSIS — E876 Hypokalemia: Secondary | ICD-10-CM | POA: Diagnosis not present

## 2013-12-30 DIAGNOSIS — Z923 Personal history of irradiation: Secondary | ICD-10-CM | POA: Diagnosis not present

## 2013-12-30 DIAGNOSIS — R279 Unspecified lack of coordination: Secondary | ICD-10-CM | POA: Insufficient documentation

## 2013-12-30 LAB — COMPREHENSIVE METABOLIC PANEL
ALK PHOS: 110 U/L (ref 39–117)
ALT: 21 U/L (ref 0–35)
AST: 31 U/L (ref 0–37)
Albumin: 4 g/dL (ref 3.5–5.2)
BILIRUBIN TOTAL: 0.8 mg/dL (ref 0.3–1.2)
BUN: 20 mg/dL (ref 6–23)
CALCIUM: 9.9 mg/dL (ref 8.4–10.5)
CHLORIDE: 100 meq/L (ref 96–112)
CO2: 30 meq/L (ref 19–32)
Creatinine, Ser: 0.94 mg/dL (ref 0.50–1.10)
GFR, EST AFRICAN AMERICAN: 65 mL/min — AB (ref >90)
GFR, EST NON AFRICAN AMERICAN: 56 mL/min — AB (ref >90)
GLUCOSE: 88 mg/dL (ref 70–99)
Potassium: 4.1 mEq/L (ref 3.7–5.3)
SODIUM: 143 meq/L (ref 137–147)
Total Protein: 7.4 g/dL (ref 6.0–8.3)

## 2013-12-30 LAB — CBC WITH DIFFERENTIAL/PLATELET
BASOS ABS: 0 10*3/uL (ref 0.0–0.1)
Basophils Relative: 0 % (ref 0–1)
EOS PCT: 2 % (ref 0–5)
Eosinophils Absolute: 0.1 10*3/uL (ref 0.0–0.7)
HEMATOCRIT: 43.7 % (ref 36.0–46.0)
Hemoglobin: 14.5 g/dL (ref 12.0–15.0)
LYMPHS ABS: 1.2 10*3/uL (ref 0.7–4.0)
LYMPHS PCT: 26 % (ref 12–46)
MCH: 34.1 pg — ABNORMAL HIGH (ref 26.0–34.0)
MCHC: 33.2 g/dL (ref 30.0–36.0)
MCV: 102.8 fL — AB (ref 78.0–100.0)
MONO ABS: 0.5 10*3/uL (ref 0.1–1.0)
Monocytes Relative: 10 % (ref 3–12)
NEUTROS ABS: 3 10*3/uL (ref 1.7–7.7)
Neutrophils Relative %: 62 % (ref 43–77)
PLATELETS: 181 10*3/uL (ref 150–400)
RBC: 4.25 MIL/uL (ref 3.87–5.11)
RDW: 13.3 % (ref 11.5–15.5)
WBC: 4.8 10*3/uL (ref 4.0–10.5)

## 2013-12-30 MED ORDER — ZOLPIDEM TARTRATE 5 MG PO TABS
2.5000 mg | ORAL_TABLET | Freq: Every evening | ORAL | Status: DC | PRN
Start: 2013-12-30 — End: 2015-08-08

## 2013-12-30 MED ORDER — TAMOXIFEN CITRATE 10 MG PO TABS: ORAL_TABLET | ORAL | Status: DC

## 2013-12-30 NOTE — Progress Notes (Signed)
Alicia Harrington  OFFICE PROGRESS Alicia Miner, MD [address] [place] [address]  DIAGNOSIS: Adenocarcinoma, breast - Plan: zolpidem (AMBIEN) 5 MG tablet, CBC with Differential, Comprehensive metabolic panel, CEA, CA 956, Vitamin D 25 hydroxy  Verruca vulgaris (Left leg)  Sick sinus syndrome  Atrial fibrillation  Arthritis - Plan: zolpidem (AMBIEN) 5 MG tablet  Chief Complaint  Patient presents with  . Breast Cancer    CURRENT THERAPY: Anastrozole 1 mg daily  INTERVAL HISTORY: Alicia Harrington 80 y.o. female returns for followup of stage II right breast cancer treated under NSABP 41 protocol with inability to tolerate Taxol plus trastuzumab due 2 heart failure, tamoxifen treated from June 2010 until August 2014 when anastrozole was started because of poor tolerability of tamoxifen. She has had more difficulty sleeping at night and has developed abdominal discomfort after being along with muscle and joint discomfort and shortness of breath that is worsening since starting anastrozole in August 2014. She denies any vaginal dryness, fever, night sweats, diarrhea, lower extremity swelling or redness, abnormalities on self breast examination, skin rash, headache, or seizures.  MEDICAL HISTORY: Past Medical History  Diagnosis Date  . Cardiomyopathy 2009    possibly secondary to Adriamycin; EF 40% in 12/09 and 50% 3/10; pulmonary edema in 2009  . Chronic kidney disease (CKD), stage I     when on diuretic with creatinine of 1.6  . Hypertension   . Adenocarcinoma, breast     treated with lumpectomy, node dissection, chemo and RT  . Hearing impairment   . DJD (degenerative joint disease)     right knee and lumbosacral spine  . History of angina 2001    normal cornary arteries in 2001  . Macular degeneration, dry     + cataracts  . Chronic anticoagulation 04/16/2011  . Complication of anesthesia     patient vomits  with demerol  . PONV (postoperative nausea and vomiting)   . Sick sinus syndrome     Paroxysmal to permanent AF; initially first degree AV block also noted  . Neuropathy     fingers and toes  . Ataxia   . Retinal macular atrophy     INTERIM HISTORY: has HEARING IMPAIRMENT; HYPERTENSION; Atrial fibrillation; Chronic anticoagulation; Cardiomyopathy; Sick sinus syndrome; Chronic kidney disease (CKD), stage I; Adenocarcinoma, breast; DJD (degenerative joint disease); History of angina; Macular degeneration, dry; Fasting hyperglycemia; and Verruca vulgaris on her problem list.  Stage II (T2, N1, M0) high-grade infiltrating ductal carcinoma the right breast, 3.5 cm in size, clinically node-positive and biopsy proven have right axillary lymph node involvement diagnosed on 04/26/2008. This was a grade 3 cancer with LV I., ER +99%, PR +100%, HER-2/neu 3+ positive with a Ki-67 marker high at 45%. She did participate in NSABP-41 trial and was randomized to Adriamycin/Cytoxan every 3 weeks for 4 cycles followed by Taxol and trastuzumab. She was unable to tolerate a trastuzumab due to congestive heart failure. She had a total of 7 treatments of the Taxol for developing severe weakness, fatigue, and grade 1 peripheral neuropathy. In June 2010 she was started on tamoxifen which was tolerated poorly with muscle pains making it difficult to sit and walk. Tamoxifen was stopped and anastrozole initiated in August 2014.      ALLERGIES:  is allergic to altace.  MEDICATIONS: has a current medication list which includes the following prescription(s): acetaminophen, anastrozole, b complex vitamins, cholecalciferol, co q-10,  furosemide, glucosamine-chondroitin, lanoxin, lutein, metoprolol succinate, multivitamin with minerals, potassium chloride sa, tramadol, valsartan, warfarin, zolpidem, and tamoxifen.  SURGICAL HISTORY:  Past Surgical History  Procedure Laterality Date  . Appendectomy    . Dilation and curettage  of uterus    . Middle ear surgery      Left  . Tonsillectomy    . Microdiscectomy lumbar  2009    lumbosacral spine  . Mastectomy partial / lumpectomy w/ axillary lymphadenectomy  2009    Carcinoma of the breast  . Colonoscopy  02/11/2012    Colonoscopy plus polypectomy x2, Rehman  . Port-a-cath removal    . Cataract extraction w/phaco  10/11/2012    Procedure: CATARACT EXTRACTION PHACO AND INTRAOCULAR LENS PLACEMENT (IOC);  Surgeon: Williams Che, MD;  Location: [facility];  Service: Ophthalmology;  Laterality: Right;  CDE:10.82    FAMILY HISTORY: family history includes Heart failure in her mother; Sudden death (age of onset: 46) in her father.  SOCIAL HISTORY:  reports that she quit smoking about 58 years ago. She has never used smokeless tobacco. She reports that she drinks alcohol. She reports that she does not use illicit drugs.  REVIEW OF SYSTEMS:  Other than that discussed above is noncontributory.  PHYSICAL EXAMINATION: ECOG PERFORMANCE STATUS: 1 - Symptomatic but completely ambulatory  Blood pressure 148/72, pulse 74, temperature 97.5 F (36.4 C), temperature source Oral, resp. rate 18, weight 117 lb 3.2 oz (53.162 kg).  GENERAL:alert, no distress and comfortable SKIN: skin color, texture, turgor are normal, no rashes or significant lesions EYES: PERLA; Conjunctiva are pink and non-injected, sclera clear OROPHARYNX:no exudate, no erythema on lips, buccal mucosa, or tongue. NECK: supple, thyroid normal size, non-tender, without nodularity. No masses CHEST: Status post right breast lumpectomy with telangiectatic changes in the breast. No evidence of breast masses. LYMPH:  no palpable lymphadenopathy in the cervical, axillary or inguinal LUNGS: clear to auscultation and percussion with normal breathing effort HEART: Irregularly irregular with no S3.. ABDOMEN:abdomen soft, non-tender and normal bowel sounds MUSCULOSKELETAL:no cyanosis of digits and no clubbing. Range of  motion normal.  NEURO: alert & oriented x 3 with fluent speech, no focal motor/sensory deficits   LABORATORY DATA: Infusion on 12/30/2013  Component Date Value Range Status  . WBC 12/30/2013 4.8  4.0 - 10.5 K/uL Final  . RBC 12/30/2013 4.25  3.87 - 5.11 MIL/uL Final  . Hemoglobin 12/30/2013 14.5  12.0 - 15.0 g/dL Final  . HCT 12/30/2013 43.7  36.0 - 46.0 % Final  . MCV 12/30/2013 102.8* 78.0 - 100.0 fL Final  . MCH 12/30/2013 34.1* 26.0 - 34.0 pg Final  . MCHC 12/30/2013 33.2  30.0 - 36.0 g/dL Final  . RDW 12/30/2013 13.3  11.5 - 15.5 % Final  . Platelets 12/30/2013 181  150 - 400 K/uL Final  . Neutrophils Relative % 12/30/2013 62  43 - 77 % Final  . Neutro Abs 12/30/2013 3.0  1.7 - 7.7 K/uL Final  . Lymphocytes Relative 12/30/2013 26  12 - 46 % Final  . Lymphs Abs 12/30/2013 1.2  0.7 - 4.0 K/uL Final  . Monocytes Relative 12/30/2013 10  3 - 12 % Final  . Monocytes Absolute 12/30/2013 0.5  0.1 - 1.0 K/uL Final  . Eosinophils Relative 12/30/2013 2  0 - 5 % Final  . Eosinophils Absolute 12/30/2013 0.1  0.0 - 0.7 K/uL Final  . Basophils Relative 12/30/2013 0  0 - 1 % Final  . Basophils Absolute 12/30/2013 0.0  0.0 -  0.1 K/uL Final  . Sodium 12/30/2013 143  137 - 147 mEq/L Final  . Potassium 12/30/2013 4.1  3.7 - 5.3 mEq/L Final  . Chloride 12/30/2013 100  96 - 112 mEq/L Final  . CO2 12/30/2013 30  19 - 32 mEq/L Final  . Glucose, Bld 12/30/2013 88  70 - 99 mg/dL Final  . BUN 12/30/2013 20  6 - 23 mg/dL Final  . Creatinine, Ser 12/30/2013 0.94  0.50 - 1.10 mg/dL Final  . Calcium 12/30/2013 9.9  8.4 - 10.5 mg/dL Final  . Total Protein 12/30/2013 7.4  6.0 - 8.3 g/dL Final  . Albumin 12/30/2013 4.0  3.5 - 5.2 g/dL Final  . AST 12/30/2013 31  0 - 37 U/L Final  . ALT 12/30/2013 21  0 - 35 U/L Final  . Alkaline Phosphatase 12/30/2013 110  39 - 117 U/L Final  . Total Bilirubin 12/30/2013 0.8  0.3 - 1.2 mg/dL Final  . GFR calc non Af Amer 12/30/2013 56* >90 mL/min Final  . GFR calc Af  Amer 12/30/2013 65* >90 mL/min Final   Comment: (NOTE)                          The eGFR has been calculated using the CKD EPI equation.                          This calculation has not been validated in all clinical situations.                          eGFR's persistently <90 mL/min signify possible Chronic Kidney                          Disease.  Anti-coag visit on 12/02/2013  Component Date Value Range Status  . INR 12/01/2013 2.1* 0.9 - 1.1 Final    PATHOLOGY:  27517) Patient: Alicia Harrington, Alicia Harrington Collected: 10/26/2013 Client: [facility] Accession: [id number] Received: 10/27/2013 Rolm Bookbinder, MD DOB: 07-04-1932 Age: 77 Gender: F Reported: 10/28/2013 [place]. Patient Ph: [phone] MRN #: [id number] [place], [place] [address] Client Acc#: [id number] Chart #: [id number] Phone: Fax: CC: REPORT OF DERMATOPATHOLOGY FINAL DIAGNOSIS and MICROSCOPIC DESCRIPTION Diagnosis Skin Biopsy-(P), (A) left posterior leg, shave VERRUCA VULGARIS, IRRITATED Microscopic Description There is papillated acanthosis with parakeratotic scale. There is enlargement of the cells with some irregularity. An inflammatory component is present. There is no significant atypia. These features correlate with an irritated verruca vulgaris. Thresa Ross MD Ph.D. Dermatopathologist, Electronic Signature (Case signed 10/28/2013) Specimen Gross and Clinical Information Clinical History (A) 32m verrucous appearing tan keratotic papule. Clinical Diagnosis (A) Irritated VV vs ISK SPECIMEN(S) OBTAINED Skin Biopsy-(P), (A) left posterior leg, shave Comment [facility], containing hematoxylin and eosin stained tissue section(s) prepared from specimen(s) with the following gross description(s) submitted with slides: GROSS DESCRIPTION Formalin fixed specimen received: 9 x 8 x 554mtoto (3x)(1b) Technical component for this case was performed at 1 of 2 FINAL for  WOCAMERON, [phone], [address] [place]    Component Value Date/Time   COLORURINE YELLOW 12/08/2008 15[place]/15/2010 1526   LABSPEC 1.009 12/08/2008 1526   PHURINE 6.5 12/08/2008 1526   GLUCOSEU NEGATIVE 12/08/2008 [place]/15/2010 [place]/15/2010 1526  KETONESUR NEGATIVE 12/08/2008 [place] 12/08/2008 1526   UROBILINOGEN 0.2 12/08/2008 1526   NITRITE NEGATIVE 12/08/2008 1526   LEUKOCYTESUR NEGATIVE MICROSCOPIC NOT DONE ON URINES WITH NEGATIVE PROTEIN, BLOOD, LEUKOCYTES, NITRITE, OR GLUCOSE <1000 mg/dL. 12/08/2008 1526    RADIOGRAPHIC STUDIES: DEXA scan done on 10/04/2013 showed osteopenia involving the lumbar spine with normal radius.  ASSESSMENT:  #1.Stage II (T2, N1, M0) high-grade infiltrating ductal carcinoma the right breast, 3.5 cm in size, clinically node-positive and biopsy proven have right axillary lymph node involvement diagnosed on 04/26/2008. This was a grade 3 cancer with LV I., ER +99%, PR +100%, HER-2/neu 3+ positive with a Ki-67 marker high at 45%. She did participate in NSABP-41 trial and was randomized to Adriamycin/Cytoxan every 3 weeks for 4 cycles followed by Taxol and trastuzumab. She was unable to tolerate a trastuzumab due to congestive heart failure. She had a total of 7 treatments of the Taxol for developing severe weakness, fatigue, and grade 1 peripheral neuropathy. In June 2010 she was started on tamoxifen which was tolerated poorly with muscle pains making it difficult to sit and walk. Tamoxifen was stopped and anastrozole initiated in August 2014. Poor tolerability. #2. Atrial fibrillation with controlled ventricular response. #3 CHF  #4 hypokalemia, awaiting today's repeat report.  #5 degenerative joint disease of the back status post disc surgery by Dr. Arnoldo Morale  #6 mild scoliosis with chronic left-sided back discomfort  #7 right  shoulder degenerative joint disease with limited range of motion  #8 degenerative joint disease versus Lyme disease    PLAN:  #1. Discontinue anastrozole. #2. Call back in one week with symptom report. #3. If improved, tamoxifen will be reordered but only 10 mg daily. #4. Ambien 2.5 milligrams nightly  was reordered. #5. Office visit 6 months with CBC, chem profile, tumor markers, and vitamin D. Mammogram will be done in March 2015.  All questions were answered. The patient knows to call the clinic with any problems, questions or concerns. We can certainly see the patient much sooner if necessary.   I spent 25 minutes counseling the patient face to face. The total time spent in the appointment was 30 minutes.    Doroteo Bradford, MD 12/30/2013 1:05 PM

## 2013-12-30 NOTE — Patient Instructions (Signed)
[facility] Discharge Instructions  RECOMMENDATIONS MADE BY THE CONSULTANT AND ANY TEST RESULTS WILL BE SENT TO YOUR REFERRING PHYSICIAN.  EXAM FINDINGS BY THE PHYSICIAN TODAY AND SIGNS OR SYMPTOMS TO REPORT TO CLINIC OR PRIMARY PHYSICIAN: Exam and findings as discussed by Dr. Barnet Glasgow.  Want you to stop the arimidex.  Call us in one week with update on your symptoms.  If they have improved we will put you back on Tamoxifen at 10 mg daily.    MEDICATIONS PRESCRIBED:  none  INSTRUCTIONS/FOLLOW-UP: Follow-up in 6 months.  Thank you for choosing [facility] to provide your oncology and hematology care.  To afford each patient quality time with our providers, please arrive at least 15 minutes before your scheduled appointment time.  With your help, our goal is to use those 15 minutes to complete the necessary work-up to ensure our physicians have the information they need to help with your evaluation and healthcare recommendations.    Effective January 1st, 2014, we ask that you re-schedule your appointment with our physicians should you arrive 10 or more minutes late for your appointment.  We strive to give you quality time with our providers, and arriving late affects you and other patients whose appointments are after yours.    Again, thank you for choosing [facility].  Our hope is that these requests will decrease the amount of time that you wait before being seen by our physicians.       _____________________________________________________________  Should you have questions after your visit to [facility], please contact our office at [phone] between the hours of 8:30 a.m. and 5:00 p.m.  Voicemails left after 4:30 p.m. will not be returned until the following business day.  For prescription refill requests, have your pharmacy contact our office with your prescription refill request.

## 2013-12-30 NOTE — Progress Notes (Signed)
Labs drawn today for cbc/diff,cmp,[id number]

## 2013-12-31 LAB — CANCER ANTIGEN 27.29: CA 27.29: 14 U/mL (ref 0–39)

## 2014-01-02 ENCOUNTER — Ambulatory Visit (INDEPENDENT_AMBULATORY_CARE_PROVIDER_SITE_OTHER): Payer: Medicare Other | Admitting: Cardiology

## 2014-01-02 ENCOUNTER — Ambulatory Visit (INDEPENDENT_AMBULATORY_CARE_PROVIDER_SITE_OTHER): Payer: Medicare Other | Admitting: *Deleted

## 2014-01-02 VITALS — BP 151/73 | HR 53 | Ht 59.0 in | Wt 116.0 lb

## 2014-01-02 DIAGNOSIS — I10 Essential (primary) hypertension: Secondary | ICD-10-CM

## 2014-01-02 DIAGNOSIS — R42 Dizziness and giddiness: Secondary | ICD-10-CM | POA: Diagnosis not present

## 2014-01-02 DIAGNOSIS — Z7901 Long term (current) use of anticoagulants: Secondary | ICD-10-CM | POA: Diagnosis not present

## 2014-01-02 DIAGNOSIS — I4891 Unspecified atrial fibrillation: Secondary | ICD-10-CM | POA: Diagnosis not present

## 2014-01-02 DIAGNOSIS — Z5181 Encounter for therapeutic drug level monitoring: Secondary | ICD-10-CM | POA: Insufficient documentation

## 2014-01-02 DIAGNOSIS — I428 Other cardiomyopathies: Secondary | ICD-10-CM | POA: Diagnosis not present

## 2014-01-02 DIAGNOSIS — I429 Cardiomyopathy, unspecified: Secondary | ICD-10-CM

## 2014-01-02 LAB — POCT INR: INR: 1.8

## 2014-01-02 MED ORDER — FUROSEMIDE 20 MG PO TABS
ORAL_TABLET | ORAL | Status: DC
Start: 2014-01-02 — End: 2014-02-21

## 2014-01-02 MED ORDER — VALSARTAN 80 MG PO TABS: 80.0000 mg | ORAL_TABLET | Freq: Every day | ORAL | Status: DC

## 2014-01-02 NOTE — Patient Instructions (Signed)
Your physician recommends that you schedule a follow-up appointment in: 6 weeks with Dr Harl Bowie  Your physician has requested that you have an echocardiogram. Echocardiography is a painless test that uses sound waves to create images of your heart. It provides your doctor with information about the size and shape of your heart and how well your heart's chambers and valves are working. This procedure takes approximately one hour. There are no restrictions for this procedure.   Your physician has recommended you make the following change in your medication:  Take Lasix 20 mg every other day

## 2014-01-02 NOTE — Progress Notes (Signed)
Clinical Summary Alicia Harrington is a 80 y.o.female former patient of Dr Lattie Haw, this is our first visit together. She is seen for the following medical problems.  1. Cardiomyopathy - possibly chemo induced, secondary to Adriamycin. - LVEF 40% 12/09, increased to 50% 01/2009 per notes, do not see actual study reports in our system.  + DOE at 1/2 block x 1 year. Goes on treadmill 30 minutes at 2 MPH. No orthopnea, + LE edema which is chronic - compliant with meds   2. HTN - does not check regularly at home - when checks at drug store typically 140s/80s.   3. Paroxysmal afib - rare palpitations, lasts short periods of time.  - prior DCCV with resolution. Thought previously to be a poor candidate for ablation per her report.  - she is on anticoagulation, followed here in coumadin clinic. Denies any bleeding issues  4. Lightheadedness - history of positional vertigo, but also describes some occasional orthostatic like symptoms.   Past Medical History  Diagnosis Date  . Cardiomyopathy 2009    possibly secondary to Adriamycin; EF 40% in 12/09 and 50% 3/10; pulmonary edema in 2009  . Chronic kidney disease (CKD), stage I     when on diuretic with creatinine of 1.6  . Hypertension   . Adenocarcinoma, breast     treated with lumpectomy, node dissection, chemo and RT  . Hearing impairment   . DJD (degenerative joint disease)     right knee and lumbosacral spine  . History of angina 2001    normal cornary arteries in 2001  . Macular degeneration, dry     + cataracts  . Chronic anticoagulation 04/16/2011  . Complication of anesthesia     patient vomits with demerol  . PONV (postoperative nausea and vomiting)   . Sick sinus syndrome     Paroxysmal to permanent AF; initially first degree AV block also noted  . Neuropathy     fingers and toes  . Ataxia   . Retinal macular atrophy      Allergies  Allergen Reactions  . Altace [Ramipril] Rash     Current Outpatient  Prescriptions  Medication Sig Dispense Refill  . acetaminophen (TYLENOL) 500 MG tablet Take 500 mg by mouth every 6 (six) hours as needed.      Marland Kitchen anastrozole (ARIMIDEX) 1 MG tablet Take 1 tablet (1 mg total) by mouth daily.  30 tablet  6  . b complex vitamins tablet Take 1 tablet by mouth daily.      . cholecalciferol (VITAMIN D) 1000 UNITS tablet Take 1,000 Units by mouth daily.        . Coenzyme Q10 (CO Q-10) 300 MG CAPS Take 300 mg by mouth daily.        . furosemide (LASIX) 20 MG tablet Take 40 mg by mouth daily. For fluid      . glucosamine-chondroitin 500-400 MG tablet Take 1 tablet by mouth daily.       Marland Kitchen LANOXIN 0.25 MG tablet TAKE (1) TABLET BY MOUTH ON MONDAY, WED, AND FRI., 1/2 TABLET ON ALL OTHER DAYS.  30 each  6  . LUTEIN PO Take 1 tablet by mouth daily.      . metoprolol succinate (TOPROL-XL) 25 MG 24 hr tablet Take 25 mg by mouth daily.        . Multiple Vitamin (MULITIVITAMIN WITH MINERALS) TABS Take 1 tablet by mouth daily.      . potassium chloride SA (KLOR-CON M20) 20  MEQ tablet Take 20 mEq by mouth daily. For potassium replacement. Only takes when taking Lasix      . tamoxifen (NOLVADEX) 10 MG tablet Take 1 tablet (10 mg) daily.  30 tablet  6  . traMADol (ULTRAM) 50 MG tablet Take 50 mg by mouth daily as needed. Only takes if necessary      . valsartan (DIOVAN) 80 MG tablet Take 1 tablet (80 mg total) by mouth daily.  30 tablet  6  . warfarin (COUMADIN) 5 MG tablet TAKE 1 TABLET EVERY DAY  35 tablet  3  . zolpidem (AMBIEN) 5 MG tablet Take 0.5 tablets (2.5 mg total) by mouth at bedtime as needed. Sleep  30 tablet  1   No current facility-administered medications for this visit.     Past Surgical History  Procedure Laterality Date  . Appendectomy    . Dilation and curettage of uterus    . Middle ear surgery      Left  . Tonsillectomy    . Microdiscectomy lumbar  2009    lumbosacral spine  . Mastectomy partial / lumpectomy w/ axillary lymphadenectomy  2009     Carcinoma of the breast  . Colonoscopy  02/11/2012    Colonoscopy plus polypectomy x2, Rehman  . Port-a-cath removal    . Cataract extraction w/phaco  10/11/2012    Procedure: CATARACT EXTRACTION PHACO AND INTRAOCULAR LENS PLACEMENT (IOC);  Surgeon: Williams Che, MD;  Location: [facility];  Service: Ophthalmology;  Laterality: Right;  CDE:10.82     Allergies  Allergen Reactions  . Altace [Ramipril] Rash      Family History  Problem Relation Age of Onset  . Heart failure Mother   . Sudden death Father 24     Social History Ms. Loy reports that she quit smoking about 58 years ago. She has never used smokeless tobacco. Ms. Stantz reports that she drinks alcohol.   Review of Systems CONSTITUTIONAL: No weight loss, fever, chills, weakness or fatigue.  HEENT: Eyes: No visual loss, blurred vision, double vision or yellow sclerae.No hearing loss, sneezing, congestion, runny nose or sore throat.  SKIN: No rash or itching.  CARDIOVASCULAR: per HPI RESPIRATORY: No shortness of breath, cough or sputum.  GASTROINTESTINAL: No anorexia, nausea, vomiting or diarrhea. No abdominal pain or blood.  GENITOURINARY: No burning on urination, no polyuria NEUROLOGICAL: dizziness, vertigo MUSCULOSKELETAL: No muscle, back pain, joint pain or stiffness.  LYMPHATICS: No enlarged nodes. No history of splenectomy.  PSYCHIATRIC: No history of depression or anxiety.  ENDOCRINOLOGIC: No reports of sweating, cold or heat intolerance. No polyuria or polydipsia.  Marland Kitchen   Physical Examination p53 bp 150/73 Wt 116 lbs BMI 23  Orthostatics Lying p 69 bp 158/78 Sitting p 70 bp 159/79 Standing p 77 bp 140/80  Gen: resting comfortably, no acute distress HEENT: no scleral icterus, pupils equal round and reactive, no palptable cervical adenopathy,  CV: irreg, rate 55, no m/r/g, no JVD, no carotid bruits Resp: Clear to auscultation bilaterally GI: abdomen is soft, non-tender, non-distended, normal bowel  sounds, no hepatosplenomegaly MSK: extremities are warm, no edema.  Skin: warm, no rash Neuro:  no focal deficits Psych: appropriate affect      Assessment and Plan 1. Cardiomyopathy - previous history of LV systolic dysfunction thought to be related to Adriamycin, function improved to low normal on last check in 2010 - reports some symptoms of DOE, orthopnea, and LE edema. She remains on tamoxifen - will check 2D echo  2. HTN -  at goal, continue current meds  3. Paroxysmal afib - rare symptoms. Continue rate control and coumadin  4. Dizziness - she was orthostatic by bp in clinic today, lasix changed to 86m every other day.       JArnoldo Lenis M.D., F.A.C.C.

## 2014-01-03 ENCOUNTER — Other Ambulatory Visit: Payer: Self-pay | Admitting: Cardiology

## 2014-01-10 ENCOUNTER — Other Ambulatory Visit (HOSPITAL_COMMUNITY): Payer: Medicare Other

## 2014-01-12 ENCOUNTER — Telehealth (HOSPITAL_COMMUNITY): Payer: Self-pay

## 2014-01-12 ENCOUNTER — Telehealth (HOSPITAL_COMMUNITY): Payer: Self-pay | Admitting: Hematology and Oncology

## 2014-01-12 NOTE — Telephone Encounter (Signed)
Patient notified and verbalized understanding of instructions.  Will call back next Thursday.

## 2014-01-12 NOTE — Telephone Encounter (Signed)
Message copied by Mellissa Kohut on Thu Jan 12, 2014  4:08 PM ------      Message from: Farrel Gobble A      Created: Thu Jan 12, 2014  4:05 PM       If there was no improvement of symptoms off Anastrozole, the implication is that anastrozole had nothing to do with the symptoms in the first place?  Continue to stay off everything and call back in one week.  Thanks and good luck. Dr.F ------

## 2014-01-12 NOTE — Telephone Encounter (Signed)
Call from [place] and has been off anastrazole since last visit.  Has not noticed a significant improvement/change in symptoms.  Wants to know if she should restart the Tamoxifen?

## 2014-01-13 DIAGNOSIS — Z79899 Other long term (current) drug therapy: Secondary | ICD-10-CM | POA: Diagnosis not present

## 2014-01-13 DIAGNOSIS — C50919 Malignant neoplasm of unspecified site of unspecified female breast: Secondary | ICD-10-CM | POA: Diagnosis not present

## 2014-01-13 DIAGNOSIS — I4891 Unspecified atrial fibrillation: Secondary | ICD-10-CM | POA: Diagnosis not present

## 2014-01-16 DIAGNOSIS — C50919 Malignant neoplasm of unspecified site of unspecified female breast: Secondary | ICD-10-CM | POA: Diagnosis not present

## 2014-01-16 DIAGNOSIS — I509 Heart failure, unspecified: Secondary | ICD-10-CM | POA: Diagnosis not present

## 2014-01-16 DIAGNOSIS — I4891 Unspecified atrial fibrillation: Secondary | ICD-10-CM | POA: Diagnosis not present

## 2014-01-16 DIAGNOSIS — Z23 Encounter for immunization: Secondary | ICD-10-CM | POA: Diagnosis not present

## 2014-01-17 ENCOUNTER — Ambulatory Visit (HOSPITAL_COMMUNITY)
Admission: RE | Admit: 2014-01-17 | Discharge: 2014-01-17 | Disposition: A | Discharge status: Home / Self Care | Payer: Medicare Other | Source: Ambulatory Visit | Attending: Cardiology | Admitting: Cardiology

## 2014-01-17 DIAGNOSIS — Z853 Personal history of malignant neoplasm of breast: Secondary | ICD-10-CM | POA: Insufficient documentation

## 2014-01-17 DIAGNOSIS — Z87891 Personal history of nicotine dependence: Secondary | ICD-10-CM | POA: Insufficient documentation

## 2014-01-17 DIAGNOSIS — R42 Dizziness and giddiness: Secondary | ICD-10-CM | POA: Insufficient documentation

## 2014-01-17 DIAGNOSIS — R0989 Other specified symptoms and signs involving the circulatory and respiratory systems: Principal | ICD-10-CM | POA: Insufficient documentation

## 2014-01-17 DIAGNOSIS — I495 Sick sinus syndrome: Secondary | ICD-10-CM | POA: Insufficient documentation

## 2014-01-17 DIAGNOSIS — I429 Cardiomyopathy, unspecified: Secondary | ICD-10-CM

## 2014-01-17 DIAGNOSIS — I4891 Unspecified atrial fibrillation: Secondary | ICD-10-CM | POA: Diagnosis not present

## 2014-01-17 DIAGNOSIS — R0609 Other forms of dyspnea: Secondary | ICD-10-CM | POA: Insufficient documentation

## 2014-01-17 DIAGNOSIS — I10 Essential (primary) hypertension: Secondary | ICD-10-CM | POA: Insufficient documentation

## 2014-01-17 DIAGNOSIS — I428 Other cardiomyopathies: Secondary | ICD-10-CM | POA: Diagnosis not present

## 2014-01-17 DIAGNOSIS — I059 Rheumatic mitral valve disease, unspecified: Secondary | ICD-10-CM | POA: Diagnosis not present

## 2014-01-17 NOTE — Progress Notes (Signed)
*  PRELIMINARY RESULTS* Echocardiogram 2D Echocardiogram has been performed.  Emma, [place] 01/17/2014, 11:38 AM

## 2014-01-20 ENCOUNTER — Other Ambulatory Visit (HOSPITAL_COMMUNITY): Payer: Self-pay | Admitting: Hematology and Oncology

## 2014-01-20 ENCOUNTER — Telehealth (HOSPITAL_COMMUNITY): Payer: Self-pay

## 2014-01-20 NOTE — Telephone Encounter (Signed)
Call back confirmation received and will get Tamoxifen and start 10 mg today.

## 2014-01-20 NOTE — Telephone Encounter (Signed)
Message left on patient's voicemail to d/c the anastrazole and start tamoxifen 10 mg daily that was escribed to her pharmacy on 12/30/13.  Call back confirmation requested.

## 2014-01-20 NOTE — Telephone Encounter (Signed)
Message copied by Mellissa Kohut on Fri Jan 20, 2014 10:52 AM ------      Message from: Farrel Gobble A      Created: Fri Jan 20, 2014 10:45 AM       Take tamoxifen 10mg  daily.  Already ordered I think. Dr.F ------

## 2014-01-20 NOTE — Telephone Encounter (Signed)
Call from patient stating "After the additional time off the anastrozole, I am feeling much better.  Joints don't hurt as much and am able to go up and down stairs better.  What does Dr. Barnet Glasgow want me to do now.  My husband is a little anxious about me not being on something for my breast cancer."

## 2014-01-23 ENCOUNTER — Ambulatory Visit (INDEPENDENT_AMBULATORY_CARE_PROVIDER_SITE_OTHER): Payer: Medicare Other | Admitting: *Deleted

## 2014-01-23 DIAGNOSIS — I4891 Unspecified atrial fibrillation: Secondary | ICD-10-CM | POA: Diagnosis not present

## 2014-01-23 DIAGNOSIS — Z5181 Encounter for therapeutic drug level monitoring: Secondary | ICD-10-CM | POA: Diagnosis not present

## 2014-01-23 DIAGNOSIS — Z7901 Long term (current) use of anticoagulants: Secondary | ICD-10-CM | POA: Diagnosis not present

## 2014-01-23 LAB — POCT INR: INR: 2.1

## 2014-01-25 NOTE — Telephone Encounter (Signed)
Resume Tamoxifen 10mg  dally.

## 2014-02-13 ENCOUNTER — Ambulatory Visit (INDEPENDENT_AMBULATORY_CARE_PROVIDER_SITE_OTHER): Payer: Medicare Other | Admitting: *Deleted

## 2014-02-13 DIAGNOSIS — Z7901 Long term (current) use of anticoagulants: Secondary | ICD-10-CM

## 2014-02-13 DIAGNOSIS — Z5181 Encounter for therapeutic drug level monitoring: Secondary | ICD-10-CM | POA: Diagnosis not present

## 2014-02-13 DIAGNOSIS — I4891 Unspecified atrial fibrillation: Secondary | ICD-10-CM | POA: Diagnosis not present

## 2014-02-13 LAB — POCT INR: INR: 2

## 2014-02-15 DIAGNOSIS — Z853 Personal history of malignant neoplasm of breast: Secondary | ICD-10-CM | POA: Diagnosis not present

## 2014-02-21 ENCOUNTER — Encounter: Payer: Self-pay | Admitting: Cardiology

## 2014-02-21 ENCOUNTER — Ambulatory Visit (INDEPENDENT_AMBULATORY_CARE_PROVIDER_SITE_OTHER): Payer: Medicare Other | Admitting: Cardiology

## 2014-02-21 VITALS — BP 137/75 | HR 91 | Ht 59.0 in | Wt 114.0 lb

## 2014-02-21 DIAGNOSIS — I4891 Unspecified atrial fibrillation: Secondary | ICD-10-CM

## 2014-02-21 DIAGNOSIS — I429 Cardiomyopathy, unspecified: Secondary | ICD-10-CM

## 2014-02-21 DIAGNOSIS — I059 Rheumatic mitral valve disease, unspecified: Secondary | ICD-10-CM | POA: Diagnosis not present

## 2014-02-21 DIAGNOSIS — I428 Other cardiomyopathies: Secondary | ICD-10-CM | POA: Diagnosis not present

## 2014-02-21 DIAGNOSIS — I340 Nonrheumatic mitral (valve) insufficiency: Secondary | ICD-10-CM | POA: Insufficient documentation

## 2014-02-21 MED ORDER — POTASSIUM CHLORIDE CRYS ER 20 MEQ PO TBCR: 10.0000 meq | EXTENDED_RELEASE_TABLET | Freq: Every day | ORAL | Status: DC

## 2014-02-21 MED ORDER — VALSARTAN 80 MG PO TABS: 80.0000 mg | ORAL_TABLET | Freq: Every day | ORAL | Status: DC

## 2014-02-21 MED ORDER — WARFARIN SODIUM 5 MG PO TABS: 5.0000 mg | ORAL_TABLET | Freq: Every day | ORAL | Status: DC

## 2014-02-21 MED ORDER — METOPROLOL SUCCINATE ER 25 MG PO TB24: 25.0000 mg | ORAL_TABLET | Freq: Every day | ORAL | Status: DC

## 2014-02-21 MED ORDER — FUROSEMIDE 20 MG PO TABS: ORAL_TABLET | ORAL | Status: DC

## 2014-02-21 NOTE — Progress Notes (Signed)
Clinical Summary Alicia Harrington is a 80 y.o.female seen today for follow up of the following medical problems.   1. Cardiomyopathy  - possibly chemo induced, secondary to Adriamycin.  - LVEF 40% 12/09, increased to 50% 01/2009 per notes, do not see actual study reports in our system.  + DOE at 1/2 block x 1 year. Goes on treadmill 30 minutes at 2 MPH. No orthopnea, + LE edema which is chronic  - repeat echo shows stable LV function - compliant with meds   2. HTN  - does not check regularly at home  - when checks at drug store typically 140s/80s.   3. Paroxysmal afib  - rare palpitations, lasts short periods of time.  - prior DCCV with resolution. Thought previously to be a poor candidate for ablation per her report.  - she is on anticoagulation, followed here in coumadin clinic. Denies any bleeding issues   4. Lightheadedness  - history of positional vertigo, but also describes some occasional orthostatic like symptoms.  - she was orthostatic at her last clinic visit, we changed her lasix to every other day and symptoms improved.   Past Medical History  Diagnosis Date  . Cardiomyopathy 2009    possibly secondary to Adriamycin; EF 40% in 12/09 and 50% 3/10; pulmonary edema in 2009  . Chronic kidney disease (CKD), stage I     when on diuretic with creatinine of 1.6  . Hypertension   . Adenocarcinoma, breast     treated with lumpectomy, node dissection, chemo and RT  . Hearing impairment   . DJD (degenerative joint disease)     right knee and lumbosacral spine  . History of angina 2001    normal cornary arteries in 2001  . Macular degeneration, dry     + cataracts  . Chronic anticoagulation 04/16/2011  . Complication of anesthesia     patient vomits with demerol  . PONV (postoperative nausea and vomiting)   . Sick sinus syndrome     Paroxysmal to permanent AF; initially first degree AV block also noted  . Neuropathy     fingers and toes  . Ataxia   . Retinal macular  atrophy      Allergies  Allergen Reactions  . Altace [Ramipril] Rash     Current Outpatient Prescriptions  Medication Sig Dispense Refill  . acetaminophen (TYLENOL) 500 MG tablet Take 500 mg by mouth every 6 (six) hours as needed.      Marland Kitchen b complex vitamins tablet Take 1 tablet by mouth daily.      . cholecalciferol (VITAMIN D) 1000 UNITS tablet Take 1,000 Units by mouth daily.        . Coenzyme Q10 (CO Q-10) 300 MG CAPS Take 300 mg by mouth daily.        . furosemide (LASIX) 20 MG tablet Take 20 mg every other day  30 tablet  6  . glucosamine-chondroitin 500-400 MG tablet Take 1 tablet by mouth daily.       Marland Kitchen LANOXIN 0.25 MG tablet TAKE (1) TABLET BY MOUTH ON MONDAY, WED, AND FRI., 1/2 TABLET ON ALL OTHER DAYS.  30 each  6  . LUTEIN PO Take 1 tablet by mouth daily.      . metoprolol succinate (TOPROL-XL) 25 MG 24 hr tablet Take 25 mg by mouth daily.        . Multiple Vitamin (MULITIVITAMIN WITH MINERALS) TABS Take 1 tablet by mouth daily.      Marland Kitchen  potassium chloride SA (KLOR-CON M20) 20 MEQ tablet Take 20 mEq by mouth daily. For potassium replacement. Only takes when taking Lasix      . tamoxifen (NOLVADEX) 10 MG tablet Take 1 tablet (10 mg) daily.  30 tablet  6  . traMADol (ULTRAM) 50 MG tablet Take 50 mg by mouth daily as needed. Only takes if necessary      . valsartan (DIOVAN) 80 MG tablet Take 1 tablet (80 mg total) by mouth daily.  30 tablet  6  . warfarin (COUMADIN) 5 MG tablet TAKE 1 TABLET EVERY DAY  35 tablet  3  . warfarin (COUMADIN) 5 MG tablet TAKE 1 TABLET EVERY DAY  35 tablet  3  . zolpidem (AMBIEN) 5 MG tablet Take 0.5 tablets (2.5 mg total) by mouth at bedtime as needed. Sleep  30 tablet  1   No current facility-administered medications for this visit.     Past Surgical History  Procedure Laterality Date  . Appendectomy    . Dilation and curettage of uterus    . Middle ear surgery      Left  . Tonsillectomy    . Microdiscectomy lumbar  2009    lumbosacral  spine  . Mastectomy partial / lumpectomy w/ axillary lymphadenectomy  2009    Carcinoma of the breast  . Colonoscopy  02/11/2012    Colonoscopy plus polypectomy x2, [facility]  . Port-a-cath removal    . Cataract extraction w/phaco  10/11/2012    Procedure: CATARACT EXTRACTION PHACO AND INTRAOCULAR LENS PLACEMENT (IOC);  Surgeon: Williams Che, MD;  Location: [facility];  Service: Ophthalmology;  Laterality: Right;  CDE:10.82     Allergies  Allergen Reactions  . Altace [Ramipril] Rash      Family History  Problem Relation Age of Onset  . Heart failure Mother   . Sudden death Father 45     Social History Ms. Carvell reports that she quit smoking about 58 years ago. She has never used smokeless tobacco. Ms. Kristensen reports that she drinks alcohol.   Review of Systems CONSTITUTIONAL: No weight loss, fever, chills, weakness or fatigue.  HEENT: Eyes: No visual loss, blurred vision, double vision or yellow sclerae.No hearing loss, sneezing, congestion, runny nose or sore throat.  SKIN: No rash or itching.  CARDIOVASCULAR: per HPI RESPIRATORY: No shortness of breath, cough or sputum.  GASTROINTESTINAL: No anorexia, nausea, vomiting or diarrhea. No abdominal pain or blood.  GENITOURINARY: No burning on urination, no polyuria NEUROLOGICAL: No headache, dizziness, syncope, paralysis, ataxia, numbness or tingling in the extremities. No change in bowel or bladder control.  MUSCULOSKELETAL: No muscle, back pain, joint pain or stiffness.  LYMPHATICS: No enlarged nodes. No history of splenectomy.  PSYCHIATRIC: No history of depression or anxiety.  ENDOCRINOLOGIC: No reports of sweating, cold or heat intolerance. No polyuria or polydipsia.  Marland Kitchen   Physical Examination p 91 bp 137/75 Wt 114 lbs BMI 23 Gen: resting comfortably, no acute distress HEENT: no scleral icterus, pupils equal round and reactive, no palptable cervical adenopathy,  CV: irreg, rate 60, 2/6 systolic murmur at  apex Resp: Clear to auscultation bilaterally GI: abdomen is soft, non-tender, non-distended, normal bowel sounds, no hepatosplenomegaly MSK: extremities are warm, no edema.  Skin: warm, no rash Neuro:  no focal deficits Psych: appropriate affect   Diagnostic Studies  12/2013 Echo LVEF 45-50%, elevated LA pressure, mild AI, moderate MR, mod TR, PASP 46   Assessment and Plan  1. Cardiomyopathy  - previous history of  LV systolic dysfunction thought to be related to Adriamycin, function improved to low normal on last check in 2010  - repeat echo shows stable function - continue current meds  2. HTN  - at goal, continue current meds   3. Paroxysmal afib  - rare symptoms. Continue rate control and coumadin   4. Dizziness  - imporoved with decreased lasix. Will change lasix to as needed  5. Mitral regurgitation - moderate by recent echo, will continue to follow     F/u 1 year    Arnoldo Lenis, M.D., F.A.C.C.

## 2014-02-21 NOTE — Patient Instructions (Signed)
Your physician recommends that you schedule a follow-up appointment in: 1 year  Your physician has recommended you make the following change in your medication:     Take lasix 20 mg daily as needed   I will make all your refills 90 days    Thank you for choosing [facility] !

## 2014-02-28 DIAGNOSIS — H8020 Cochlear otosclerosis, unspecified ear: Secondary | ICD-10-CM | POA: Diagnosis not present

## 2014-02-28 DIAGNOSIS — H905 Unspecified sensorineural hearing loss: Secondary | ICD-10-CM | POA: Diagnosis not present

## 2014-02-28 DIAGNOSIS — H6120 Impacted cerumen, unspecified ear: Secondary | ICD-10-CM | POA: Diagnosis not present

## 2014-02-28 DIAGNOSIS — H903 Sensorineural hearing loss, bilateral: Secondary | ICD-10-CM | POA: Diagnosis not present

## 2014-03-23 ENCOUNTER — Ambulatory Visit (INDEPENDENT_AMBULATORY_CARE_PROVIDER_SITE_OTHER): Payer: Medicare Other | Admitting: *Deleted

## 2014-03-23 DIAGNOSIS — Z7901 Long term (current) use of anticoagulants: Secondary | ICD-10-CM | POA: Diagnosis not present

## 2014-03-23 DIAGNOSIS — Z5181 Encounter for therapeutic drug level monitoring: Secondary | ICD-10-CM | POA: Diagnosis not present

## 2014-03-23 DIAGNOSIS — I4891 Unspecified atrial fibrillation: Secondary | ICD-10-CM | POA: Diagnosis not present

## 2014-03-23 DIAGNOSIS — Z79899 Other long term (current) drug therapy: Secondary | ICD-10-CM | POA: Diagnosis not present

## 2014-03-23 DIAGNOSIS — C50919 Malignant neoplasm of unspecified site of unspecified female breast: Secondary | ICD-10-CM | POA: Diagnosis not present

## 2014-03-23 LAB — POCT INR: INR: 2.3

## 2014-03-27 DIAGNOSIS — I4891 Unspecified atrial fibrillation: Secondary | ICD-10-CM | POA: Diagnosis not present

## 2014-03-27 DIAGNOSIS — I428 Other cardiomyopathies: Secondary | ICD-10-CM | POA: Diagnosis not present

## 2014-03-31 ENCOUNTER — Encounter: Payer: Self-pay | Admitting: Cardiology

## 2014-04-24 ENCOUNTER — Ambulatory Visit (INDEPENDENT_AMBULATORY_CARE_PROVIDER_SITE_OTHER): Payer: Medicare Other | Admitting: *Deleted

## 2014-04-24 DIAGNOSIS — I4891 Unspecified atrial fibrillation: Secondary | ICD-10-CM

## 2014-04-24 DIAGNOSIS — Z5181 Encounter for therapeutic drug level monitoring: Secondary | ICD-10-CM

## 2014-04-24 LAB — PROTIME-INR: INR: 2.1 — AB (ref 0.9–1.1)

## 2014-04-25 ENCOUNTER — Telehealth: Payer: Self-pay | Admitting: *Deleted

## 2014-04-25 NOTE — Telephone Encounter (Signed)
See coumadin note from 6/1.  Spoke with pt today and gave her coumadin instructions and f/u appt made.

## 2014-04-25 NOTE — Telephone Encounter (Signed)
Pt was wondering if you got her INR results

## 2014-05-25 ENCOUNTER — Ambulatory Visit (INDEPENDENT_AMBULATORY_CARE_PROVIDER_SITE_OTHER): Payer: Medicare Other | Admitting: *Deleted

## 2014-05-25 DIAGNOSIS — I482 Chronic atrial fibrillation, unspecified: Secondary | ICD-10-CM

## 2014-05-25 DIAGNOSIS — I4891 Unspecified atrial fibrillation: Secondary | ICD-10-CM

## 2014-05-25 DIAGNOSIS — Z7901 Long term (current) use of anticoagulants: Secondary | ICD-10-CM | POA: Diagnosis not present

## 2014-05-25 DIAGNOSIS — Z5181 Encounter for therapeutic drug level monitoring: Secondary | ICD-10-CM

## 2014-05-25 LAB — POCT INR: INR: 2.5

## 2014-05-31 DIAGNOSIS — Z853 Personal history of malignant neoplasm of breast: Secondary | ICD-10-CM | POA: Diagnosis not present

## 2014-05-31 DIAGNOSIS — N6459 Other signs and symptoms in breast: Secondary | ICD-10-CM | POA: Diagnosis not present

## 2014-06-06 DIAGNOSIS — Z961 Presence of intraocular lens: Secondary | ICD-10-CM | POA: Diagnosis not present

## 2014-06-06 DIAGNOSIS — H3530 Unspecified macular degeneration: Secondary | ICD-10-CM | POA: Diagnosis not present

## 2014-06-06 DIAGNOSIS — H2510 Age-related nuclear cataract, unspecified eye: Secondary | ICD-10-CM | POA: Diagnosis not present

## 2014-06-15 ENCOUNTER — Ambulatory Visit (INDEPENDENT_AMBULATORY_CARE_PROVIDER_SITE_OTHER): Payer: Medicare Other | Admitting: *Deleted

## 2014-06-15 DIAGNOSIS — Z5181 Encounter for therapeutic drug level monitoring: Secondary | ICD-10-CM

## 2014-06-15 DIAGNOSIS — I4891 Unspecified atrial fibrillation: Secondary | ICD-10-CM | POA: Diagnosis not present

## 2014-06-15 DIAGNOSIS — Z7901 Long term (current) use of anticoagulants: Secondary | ICD-10-CM

## 2014-06-15 LAB — POCT INR: INR: 2.4

## 2014-06-21 ENCOUNTER — Other Ambulatory Visit (HOSPITAL_COMMUNITY): Payer: Self-pay

## 2014-06-21 DIAGNOSIS — C50919 Malignant neoplasm of unspecified site of unspecified female breast: Secondary | ICD-10-CM

## 2014-06-30 ENCOUNTER — Encounter (HOSPITAL_BASED_OUTPATIENT_CLINIC_OR_DEPARTMENT_OTHER): Payer: Medicare Other

## 2014-06-30 ENCOUNTER — Encounter (HOSPITAL_COMMUNITY): Payer: Self-pay

## 2014-06-30 ENCOUNTER — Encounter (HOSPITAL_COMMUNITY): Payer: Medicare Other | Attending: Hematology and Oncology

## 2014-06-30 VITALS — BP 139/57 | HR 79 | Temp 97.6°F | Resp 18 | Wt 116.0 lb

## 2014-06-30 DIAGNOSIS — C50911 Malignant neoplasm of unspecified site of right female breast: Secondary | ICD-10-CM

## 2014-06-30 DIAGNOSIS — Z79899 Other long term (current) drug therapy: Secondary | ICD-10-CM | POA: Diagnosis not present

## 2014-06-30 DIAGNOSIS — M1710 Unilateral primary osteoarthritis, unspecified knee: Secondary | ICD-10-CM | POA: Insufficient documentation

## 2014-06-30 DIAGNOSIS — H3530 Unspecified macular degeneration: Secondary | ICD-10-CM | POA: Insufficient documentation

## 2014-06-30 DIAGNOSIS — I428 Other cardiomyopathies: Secondary | ICD-10-CM | POA: Diagnosis not present

## 2014-06-30 DIAGNOSIS — C50919 Malignant neoplasm of unspecified site of unspecified female breast: Secondary | ICD-10-CM | POA: Diagnosis not present

## 2014-06-30 DIAGNOSIS — H9190 Unspecified hearing loss, unspecified ear: Secondary | ICD-10-CM | POA: Insufficient documentation

## 2014-06-30 DIAGNOSIS — Z87891 Personal history of nicotine dependence: Secondary | ICD-10-CM | POA: Insufficient documentation

## 2014-06-30 DIAGNOSIS — M47817 Spondylosis without myelopathy or radiculopathy, lumbosacral region: Secondary | ICD-10-CM | POA: Insufficient documentation

## 2014-06-30 DIAGNOSIS — R279 Unspecified lack of coordination: Secondary | ICD-10-CM | POA: Insufficient documentation

## 2014-06-30 DIAGNOSIS — I129 Hypertensive chronic kidney disease with stage 1 through stage 4 chronic kidney disease, or unspecified chronic kidney disease: Secondary | ICD-10-CM | POA: Diagnosis not present

## 2014-06-30 DIAGNOSIS — Z7901 Long term (current) use of anticoagulants: Secondary | ICD-10-CM | POA: Insufficient documentation

## 2014-06-30 DIAGNOSIS — Z888 Allergy status to other drugs, medicaments and biological substances status: Secondary | ICD-10-CM | POA: Diagnosis not present

## 2014-06-30 DIAGNOSIS — G609 Hereditary and idiopathic neuropathy, unspecified: Secondary | ICD-10-CM | POA: Insufficient documentation

## 2014-06-30 DIAGNOSIS — N181 Chronic kidney disease, stage 1: Secondary | ICD-10-CM | POA: Insufficient documentation

## 2014-06-30 DIAGNOSIS — I495 Sick sinus syndrome: Secondary | ICD-10-CM

## 2014-06-30 DIAGNOSIS — I4891 Unspecified atrial fibrillation: Secondary | ICD-10-CM | POA: Diagnosis not present

## 2014-06-30 LAB — COMPREHENSIVE METABOLIC PANEL
ALK PHOS: 75 U/L (ref 39–117)
ALT: 20 U/L (ref 0–35)
AST: 36 U/L (ref 0–37)
Albumin: 3.8 g/dL (ref 3.5–5.2)
Anion gap: 14 (ref 5–15)
BILIRUBIN TOTAL: 0.7 mg/dL (ref 0.3–1.2)
BUN: 18 mg/dL (ref 6–23)
CHLORIDE: 105 meq/L (ref 96–112)
CO2: 23 mEq/L (ref 19–32)
Calcium: 9.3 mg/dL (ref 8.4–10.5)
Creatinine, Ser: 0.9 mg/dL (ref 0.50–1.10)
GFR calc non Af Amer: 59 mL/min — ABNORMAL LOW (ref >90)
GFR, EST AFRICAN AMERICAN: 68 mL/min — AB (ref >90)
GLUCOSE: 119 mg/dL — AB (ref 70–99)
POTASSIUM: 3.7 meq/L (ref 3.7–5.3)
SODIUM: 142 meq/L (ref 137–147)
TOTAL PROTEIN: 6.5 g/dL (ref 6.0–8.3)

## 2014-06-30 LAB — CBC WITH DIFFERENTIAL/PLATELET
Basophils Absolute: 0 10*3/uL (ref 0.0–0.1)
Basophils Relative: 1 % (ref 0–1)
EOS ABS: 0.1 10*3/uL (ref 0.0–0.7)
Eosinophils Relative: 3 % (ref 0–5)
HCT: 41.3 % (ref 36.0–46.0)
HEMOGLOBIN: 14.1 g/dL (ref 12.0–15.0)
LYMPHS ABS: 1.2 10*3/uL (ref 0.7–4.0)
LYMPHS PCT: 31 % (ref 12–46)
MCH: 34.8 pg — AB (ref 26.0–34.0)
MCHC: 34.1 g/dL (ref 30.0–36.0)
MCV: 102 fL — ABNORMAL HIGH (ref 78.0–100.0)
MONOS PCT: 12 % (ref 3–12)
Monocytes Absolute: 0.5 10*3/uL (ref 0.1–1.0)
NEUTROS ABS: 2.2 10*3/uL (ref 1.7–7.7)
NEUTROS PCT: 53 % (ref 43–77)
PLATELETS: 158 10*3/uL (ref 150–400)
RBC: 4.05 MIL/uL (ref 3.87–5.11)
RDW: 14.1 % (ref 11.5–15.5)
WBC: 4 10*3/uL (ref 4.0–10.5)

## 2014-06-30 NOTE — Progress Notes (Signed)
[facility]  OFFICE PROGRESS Alicia Miner, MD [address] [place] [address]  DIAGNOSIS: Adenocarcinoma, breast, right  Sick sinus syndrome  Atrial fibrillation, unspecified  Chief Complaint  Patient presents with  . Breast Cancer    CURRENT THERAPY: Tamoxifen 10 mg daily  INTERVAL HISTORY: Alicia Harrington 80 y.o. female returns for followup of stage II right breast cancer treated under NSABP 41 protocol with inability to tolerate Taxol plus trastuzumab due 2 heart failure, tamoxifen  from June 2010 until August 2014 when anastrozole was started because of poor tolerability of tamoxifen at 20 mg daily. On 12/30/2013 anastrozole was discontinued because of joint discomfort and muscle aches and worsening shortness of breath.  Tamoxifen was reinstituted but at only 10 mg daily on 12/30/2013.    MEDICAL HISTORY: Past Medical History  Diagnosis Date  . Cardiomyopathy 2009    possibly secondary to Adriamycin; EF 40% in 12/09 and 50% 3/10; pulmonary edema in 2009  . Chronic kidney disease (CKD), stage I     when on diuretic with creatinine of 1.6  . Hypertension   . Adenocarcinoma, breast     treated with lumpectomy, node dissection, chemo and RT  . Hearing impairment   . DJD (degenerative joint disease)     right knee and lumbosacral spine  . History of angina 2001    normal cornary arteries in 2001  . Macular degeneration, dry     + cataracts  . Chronic anticoagulation 04/16/2011  . Complication of anesthesia     patient vomits with demerol  . PONV (postoperative nausea and vomiting)   . Sick sinus syndrome     Paroxysmal to permanent AF; initially first degree AV block also noted  . Neuropathy     fingers and toes  . Ataxia   . Retinal macular atrophy     INTERIM HISTORY: has HEARING IMPAIRMENT; HYPERTENSION; Atrial fibrillation; Chronic anticoagulation; Cardiomyopathy; Sick sinus syndrome; Chronic  kidney disease (CKD), stage I; Adenocarcinoma, breast; DJD (degenerative joint disease); History of angina; Macular degeneration, dry; Fasting hyperglycemia; Verruca vulgaris; Encounter for therapeutic drug monitoring; and Mitral regurgitation on her problem list.   Stage II (T2, N1, M0) high-grade infiltrating ductal carcinoma the right breast, 3.5 cm in size, clinically node-positive and biopsy proven have right axillary lymph node involvement diagnosed on 04/26/2008. This was a grade 3 cancer with LV I., ER +99%, PR +100%, HER-2/neu 3+ positive with a Ki-67 marker high at 45%. She did participate in NSABP-41 trial and was randomized to Adriamycin/Cytoxan every 3 weeks for 4 cycles followed by Taxol and trastuzumab. She was unable to tolerate a trastuzumab due to congestive heart failure. She had a total of 7 treatments of the Taxol for developing severe weakness, fatigue, and grade 1 peripheral neuropathy. In June 2010 she was started on tamoxifen which was tolerated poorly with muscle pains making it difficult to sit and walk. Tamoxifen was stopped and anastrozole initiated in August 2014. Anastrozole discontinued on 12/30/2013 and tamoxifen 10 mg daily was ordered. She is "always fatigued". Joint discomfort is much better. She recently [place] and was more short of breath in usual. Recent ultrasound was done because of abnormalities on self breast examination which turned out to be scar tissue. She is also developed mild mitral insufficiency not requiring any surgical intervention. She's had chronic cough but no PND, orthopnea, or palpitations that are  significant. She denies any epistaxis, vaginal bleeding or discharge, but with lower 70 swelling which dissipates overnight. She does have nocturia x1.  ALLERGIES:  is allergic to altace.  MEDICATIONS: has a current medication list which includes the following prescription(s): acetaminophen, b complex vitamins, cholecalciferol, co  q-10, furosemide, glucosamine-chondroitin, lutein, metoprolol succinate, multivitamin with minerals, potassium chloride sa, tamoxifen, tramadol, valsartan, warfarin, and zolpidem.  SURGICAL HISTORY:  Past Surgical History  Procedure Laterality Date  . Appendectomy    . Dilation and curettage of uterus    . Middle ear surgery      Left  . Tonsillectomy    . Microdiscectomy lumbar  2009    lumbosacral spine  . Mastectomy partial / lumpectomy w/ axillary lymphadenectomy  2009    Carcinoma of the breast  . Colonoscopy  02/11/2012    Colonoscopy plus polypectomy x2, Alicia Harrington  . Port-a-cath removal    . Cataract extraction w/phaco  10/11/2012    Procedure: CATARACT EXTRACTION PHACO AND INTRAOCULAR LENS PLACEMENT (IOC);  Surgeon: Alicia Che, MD;  Location: [facility];  Service: Ophthalmology;  Laterality: Right;  CDE:10.82    FAMILY HISTORY: family history includes Heart failure in her mother; Sudden death (age of onset: 43) in her father.  SOCIAL HISTORY:  reports that she quit smoking about 58 years ago. She has never used smokeless tobacco. She reports that she drinks alcohol. She reports that she does not use illicit drugs.  REVIEW OF SYSTEMS:  Other than that discussed above is noncontributory.  PHYSICAL EXAMINATION: ECOG PERFORMANCE STATUS: 1 - Symptomatic but completely ambulatory  Blood pressure 139/57, pulse 79, temperature 97.6 F (36.4 C), temperature source Oral, resp. rate 18, weight 116 lb (52.617 kg).  GENERAL:alert, no distress and comfortable SKIN: skin color, texture, turgor are normal, no rashes or significant lesions EYES: PERLA; Conjunctiva are pink and non-injected, sclera clear SINUSES: No redness or tenderness over maxillary or ethmoid sinuses OROPHARYNX:no exudate, no erythema on lips, buccal mucosa, or tongue. NECK: supple, thyroid normal size, non-tender, without nodularity. No masses CHEST:  STATUS post right breast lumpectomy with slight ectatic changes in  the breast with no evidence of masses either side. LYMPH:  no palpable lymphadenopathy in the cervical, axillary or inguinal LUNGS: clear to auscultation and percussion with normal breathing effort HEART: irregularly irregular with no S3 but with a 2/6 pansystolic murmur at the apex radiating to the left axilla.  ABDOMEN:abdomen soft, non-tender and normal bowel sounds MUSCULOSKELETAL:no cyanosis of digits and no clubbing. Range of motion normal.  NEURO: alert & oriented x 3 with fluent speech, no focal motor/sensory deficits   LABORATORY DATA: Lab on 06/30/2014  Component Date Value Ref Range Status  . WBC 06/30/2014 4.0  4.0 - 10.5 K/uL Final  . RBC 06/30/2014 4.05  3.87 - 5.11 MIL/uL Final  . Hemoglobin 06/30/2014 14.1  12.0 - 15.0 g/dL Final  . HCT 06/30/2014 41.3  36.0 - 46.0 % Final  . MCV 06/30/2014 102.0* 78.0 - 100.0 fL Final  . MCH 06/30/2014 34.8* 26.0 - 34.0 pg Final  . MCHC 06/30/2014 34.1  30.0 - 36.0 g/dL Final  . RDW 06/30/2014 14.1  11.5 - 15.5 % Final  . Platelets 06/30/2014 158  150 - 400 K/uL Final  . Neutrophils Relative % 06/30/2014 53  43 - 77 % Final  . Neutro Abs 06/30/2014 2.2  1.7 - 7.7 K/uL Final  . Lymphocytes Relative 06/30/2014 31  12 - 46 % Final  . Lymphs Abs 06/30/2014  1.2  0.7 - 4.0 K/uL Final  . Monocytes Relative 06/30/2014 12  3 - 12 % Final  . Monocytes Absolute 06/30/2014 0.5  0.1 - 1.0 K/uL Final  . Eosinophils Relative 06/30/2014 3  0 - 5 % Final  . Eosinophils Absolute 06/30/2014 0.1  0.0 - 0.7 K/uL Final  . Basophils Relative 06/30/2014 1  0 - 1 % Final  . Basophils Absolute 06/30/2014 0.0  0.0 - 0.1 K/uL Final  . Sodium 06/30/2014 142  137 - 147 mEq/L Final  . Potassium 06/30/2014 3.7  3.7 - 5.3 mEq/L Final  . Chloride 06/30/2014 105  96 - 112 mEq/L Final  . CO2 06/30/2014 23  19 - 32 mEq/L Final  . Glucose, Bld 06/30/2014 119* 70 - 99 mg/dL Final  . BUN 06/30/2014 18  6 - 23 mg/dL Final  . Creatinine, Ser 06/30/2014 0.90  0.50 - 1.10  mg/dL Final  . Calcium 06/30/2014 9.3  8.4 - 10.5 mg/dL Final  . Total Protein 06/30/2014 6.5  6.0 - 8.3 g/dL Final  . Albumin 06/30/2014 3.8  3.5 - 5.2 g/dL Final  . AST 06/30/2014 36  0 - 37 U/L Final  . ALT 06/30/2014 20  0 - 35 U/L Final  . Alkaline Phosphatase 06/30/2014 75  39 - 117 U/L Final  . Total Bilirubin 06/30/2014 0.7  0.3 - 1.2 mg/dL Final  . GFR calc non Af Amer 06/30/2014 59* >90 mL/min Final  . GFR calc Af Amer 06/30/2014 68* >90 mL/min Final   Comment: (NOTE)                          The eGFR has been calculated using the CKD EPI equation.                          This calculation has not been validated in all clinical situations.                          eGFR's persistently <90 mL/min signify possible Chronic Kidney                          Disease.  . Anion gap 06/30/2014 14  5 - 15 Final  Anti-coag visit on 06/15/2014  Component Date Value Ref Range Status  . INR 06/15/2014 2.4   Final    PATHOLOGY: no new pathology. Original tumor was HER-2 positive.  Urinalysis    Component Value Date/Time   COLORURINE YELLOW 12/08/2008 1526   APPEARANCEUR CLEAR 12/08/2008 1526   LABSPEC 1.009 12/08/2008 1526   PHURINE 6.5 12/08/2008 1526   GLUCOSEU NEGATIVE 12/08/2008 1526   HGBUR NEGATIVE 12/08/2008 [place] 12/08/2008 1526   [place] 12/08/2008 1526   PROTEINUR NEGATIVE 12/08/2008 1526   UROBILINOGEN 0.2 12/08/2008 1526   NITRITE NEGATIVE 12/08/2008 1526   LEUKOCYTESUR NEGATIVE MICROSCOPIC NOT DONE ON URINES WITH NEGATIVE PROTEIN, BLOOD, LEUKOCYTES, NITRITE, OR GLUCOSE <1000 mg/dL. 12/08/2008 1526    RADIOGRAPHIC STUDIES: DEXA scan done on 10/04/2013 showed osteopenia involving the lumbar spine with normal radius.      ASSESSMENT:  #1.Stage II (T2, N1, M0) high-grade infiltrating ductal carcinoma the right breast, 3.5 cm in size, clinically node-positive and biopsy proven have right axillary lymph node involvement diagnosed on 04/26/2008. This  was a grade 3 cancer with LV I., ER +99%,  PR +100%, HER-2/neu 3+ positive with a Ki-67 marker high at 45%. She did participate in NSABP-41 trial and was randomized to Adriamycin/Cytoxan every 3 weeks for 4 cycles followed by Taxol and trastuzumab. She was unable to tolerate a trastuzumab due to congestive heart failure. She had a total of 7 treatments of the Taxol for developing severe weakness, fatigue, and grade 1 peripheral neuropathy. In June 2010 she was started on tamoxifen which was tolerated poorly with muscle pains making it difficult to sit and walk. Tamoxifen was stopped and anastrozole initiated in August 2014. Poor tolerability so anastrozole was stopped and tamoxifen at 10 mg daily instituted on 12/30/2013 with improved tolerability. #2. Chronic atrial fibrillation with controlled ventricular response. #3. Mild mitral regurgitation.    PLAN:  #1. Continue warfarin and tamoxifen at 10 mg daily. #2. Followup in 6 months with CBC, chem profile, [place] 2729, CEA, and vitamin D level.   All questions were answered. The patient knows to call the clinic with any problems, questions or concerns. We can certainly see the patient much sooner if necessary.   I spent 30 minutes counseling the patient face to face. The total time spent in the appointment was 40 minutes.    Doroteo Bradford, MD 06/30/2014 12:44 PM  DISCLAIMER:  This note was dictated with voice recognition software.  Similar sounding words can inadvertently be transcribed inaccurately and may not be corrected upon review.  g

## 2014-06-30 NOTE — Patient Instructions (Signed)
[facility] Discharge Instructions  RECOMMENDATIONS MADE BY THE CONSULTANT AND ANY TEST RESULTS WILL BE SENT TO YOUR REFERRING PHYSICIAN.  EXAM FINDINGS BY THE PHYSICIAN TODAY AND SIGNS OR SYMPTOMS TO REPORT TO CLINIC OR PRIMARY PHYSICIAN: Exam and findings as discussed by Dr.Formanek.  MEDICATIONS PRESCRIBED:  Continue as prescribed.  INSTRUCTIONS/FOLLOW-UP: Lab work again in 6 months. MD appointment again in 6 months. Report any issues/concerns to clinic as needed prior to appointment.  Thank you for choosing [facility] to provide your oncology and hematology care.  To afford each patient quality time with our providers, please arrive at least 15 minutes before your scheduled appointment time.  With your help, our goal is to use those 15 minutes to complete the necessary work-up to ensure our physicians have the information they need to help with your evaluation and healthcare recommendations.    Effective January 1st, 2014, we ask that you re-schedule your appointment with our physicians should you arrive 10 or more minutes late for your appointment.  We strive to give you quality time with our providers, and arriving late affects you and other patients whose appointments are after yours.    Again, thank you for choosing [facility].  Our hope is that these requests will decrease the amount of time that you wait before being seen by our physicians.       _____________________________________________________________  Should you have questions after your visit to [facility], please contact our office at [phone] between the hours of 8:30 a.m. and 4:30 p.m.  Voicemails left after 4:30 p.m. will not be returned until the following business day.  For prescription refill requests, have your pharmacy contact our office with your prescription refill request.    _______________________________________________________________  We  hope that we have given you very good care.  You may receive a patient satisfaction survey in the mail, please complete it and return it as soon as possible.  We value your feedback!  _______________________________________________________________  Have you asked about our STAR program?  STAR stands for Survivorship Training and Rehabilitation, and this is a nationally recognized cancer care program that focuses on survivorship and rehabilitation.  Cancer and cancer treatments may cause problems, such as, pain, making you feel tired and keeping you from doing the things that you need or want to do. Cancer rehabilitation can help. Our goal is to reduce these troubling effects and help you have the best quality of life possible.  You may receive a survey from a nurse that asks questions about your current state of health.  Based on the survey results, all eligible patients will be referred to the [facility] program for an evaluation so we can better serve you!  A frequently asked questions sheet is available upon request.

## 2014-06-30 NOTE — Progress Notes (Signed)
Labs drawn for cbcd,[id number]

## 2014-07-01 LAB — CANCER ANTIGEN 27.29: CA 27.29: 10 U/mL (ref 0–39)

## 2014-07-01 LAB — CEA: CEA: 1.7 ng/mL (ref 0.0–5.0)

## 2014-07-01 LAB — VITAMIN D 25 HYDROXY (VIT D DEFICIENCY, FRACTURES): Vit D, 25-Hydroxy: 48 ng/mL (ref 30–89)

## 2014-07-11 DIAGNOSIS — H4011X0 Primary open-angle glaucoma, stage unspecified: Secondary | ICD-10-CM | POA: Diagnosis not present

## 2014-07-11 DIAGNOSIS — Z961 Presence of intraocular lens: Secondary | ICD-10-CM | POA: Diagnosis not present

## 2014-07-11 DIAGNOSIS — H353190 Nonexudative age-related macular degeneration, unspecified eye, stage unspecified: Secondary | ICD-10-CM | POA: Diagnosis not present

## 2014-07-11 DIAGNOSIS — H2510 Age-related nuclear cataract, unspecified eye: Secondary | ICD-10-CM | POA: Diagnosis not present

## 2014-07-13 ENCOUNTER — Ambulatory Visit (INDEPENDENT_AMBULATORY_CARE_PROVIDER_SITE_OTHER): Payer: Medicare Other | Admitting: *Deleted

## 2014-07-13 DIAGNOSIS — I4891 Unspecified atrial fibrillation: Secondary | ICD-10-CM

## 2014-07-13 DIAGNOSIS — Z7901 Long term (current) use of anticoagulants: Secondary | ICD-10-CM | POA: Diagnosis not present

## 2014-07-13 DIAGNOSIS — Z5181 Encounter for therapeutic drug level monitoring: Secondary | ICD-10-CM | POA: Diagnosis not present

## 2014-07-13 LAB — POCT INR: INR: 1.9

## 2014-07-25 DIAGNOSIS — H4011X0 Primary open-angle glaucoma, stage unspecified: Secondary | ICD-10-CM | POA: Diagnosis not present

## 2014-07-25 DIAGNOSIS — H2510 Age-related nuclear cataract, unspecified eye: Secondary | ICD-10-CM | POA: Diagnosis not present

## 2014-07-25 DIAGNOSIS — Z961 Presence of intraocular lens: Secondary | ICD-10-CM | POA: Diagnosis not present

## 2014-07-25 DIAGNOSIS — H353190 Nonexudative age-related macular degeneration, unspecified eye, stage unspecified: Secondary | ICD-10-CM | POA: Diagnosis not present

## 2014-08-01 DIAGNOSIS — I4891 Unspecified atrial fibrillation: Secondary | ICD-10-CM | POA: Diagnosis not present

## 2014-08-01 DIAGNOSIS — C50919 Malignant neoplasm of unspecified site of unspecified female breast: Secondary | ICD-10-CM | POA: Diagnosis not present

## 2014-08-01 DIAGNOSIS — Z79899 Other long term (current) drug therapy: Secondary | ICD-10-CM | POA: Diagnosis not present

## 2014-08-07 DIAGNOSIS — I4891 Unspecified atrial fibrillation: Secondary | ICD-10-CM | POA: Diagnosis not present

## 2014-08-07 DIAGNOSIS — I509 Heart failure, unspecified: Secondary | ICD-10-CM | POA: Diagnosis not present

## 2014-08-10 ENCOUNTER — Ambulatory Visit (INDEPENDENT_AMBULATORY_CARE_PROVIDER_SITE_OTHER): Payer: Medicare Other | Admitting: *Deleted

## 2014-08-10 DIAGNOSIS — Z7901 Long term (current) use of anticoagulants: Secondary | ICD-10-CM | POA: Diagnosis not present

## 2014-08-10 DIAGNOSIS — Z5181 Encounter for therapeutic drug level monitoring: Secondary | ICD-10-CM | POA: Diagnosis not present

## 2014-08-10 DIAGNOSIS — I4891 Unspecified atrial fibrillation: Secondary | ICD-10-CM

## 2014-08-10 LAB — POCT INR: INR: 2.2

## 2014-08-14 DIAGNOSIS — Z23 Encounter for immunization: Secondary | ICD-10-CM | POA: Diagnosis not present

## 2014-09-05 DIAGNOSIS — I4891 Unspecified atrial fibrillation: Secondary | ICD-10-CM | POA: Diagnosis not present

## 2014-09-05 DIAGNOSIS — Z7901 Long term (current) use of anticoagulants: Secondary | ICD-10-CM | POA: Diagnosis not present

## 2014-09-25 ENCOUNTER — Telehealth: Payer: Self-pay | Admitting: *Deleted

## 2014-09-25 ENCOUNTER — Telehealth (HOSPITAL_COMMUNITY): Payer: Self-pay | Admitting: *Deleted

## 2014-09-26 ENCOUNTER — Other Ambulatory Visit (HOSPITAL_COMMUNITY): Payer: Self-pay | Admitting: Hematology and Oncology

## 2014-09-26 DIAGNOSIS — N838 Other noninflammatory disorders of ovary, fallopian tube and broad ligament: Secondary | ICD-10-CM

## 2014-09-29 ENCOUNTER — Ambulatory Visit (HOSPITAL_COMMUNITY)
Admission: RE | Admit: 2014-09-29 | Discharge: 2014-09-29 | Disposition: A | Discharge status: Home / Self Care | Payer: Medicare Other | Source: Ambulatory Visit | Attending: Hematology and Oncology | Admitting: Hematology and Oncology

## 2014-09-29 DIAGNOSIS — C50919 Malignant neoplasm of unspecified site of unspecified female breast: Secondary | ICD-10-CM | POA: Diagnosis not present

## 2014-09-29 DIAGNOSIS — D259 Leiomyoma of uterus, unspecified: Secondary | ICD-10-CM | POA: Insufficient documentation

## 2014-09-29 DIAGNOSIS — N8329 Other ovarian cysts: Secondary | ICD-10-CM | POA: Diagnosis not present

## 2014-09-29 DIAGNOSIS — N838 Other noninflammatory disorders of ovary, fallopian tube and broad ligament: Secondary | ICD-10-CM

## 2014-09-29 NOTE — Telephone Encounter (Signed)
Notified pt that she did not have an ovarian cyst and that if she was having symptoms to follow up with her GYN.

## 2014-10-02 ENCOUNTER — Ambulatory Visit (INDEPENDENT_AMBULATORY_CARE_PROVIDER_SITE_OTHER): Payer: Medicare Other | Admitting: *Deleted

## 2014-10-02 DIAGNOSIS — I4891 Unspecified atrial fibrillation: Secondary | ICD-10-CM | POA: Diagnosis not present

## 2014-10-02 DIAGNOSIS — Z5181 Encounter for therapeutic drug level monitoring: Secondary | ICD-10-CM | POA: Diagnosis not present

## 2014-10-02 DIAGNOSIS — H903 Sensorineural hearing loss, bilateral: Secondary | ICD-10-CM | POA: Diagnosis not present

## 2014-10-02 DIAGNOSIS — H6123 Impacted cerumen, bilateral: Secondary | ICD-10-CM | POA: Diagnosis not present

## 2014-10-02 DIAGNOSIS — H8023 Cochlear otosclerosis, bilateral: Secondary | ICD-10-CM | POA: Diagnosis not present

## 2014-10-02 LAB — POCT INR: INR: 2.6

## 2014-10-03 ENCOUNTER — Ambulatory Visit (HOSPITAL_COMMUNITY): Payer: Medicare Other

## 2014-10-04 NOTE — Progress Notes (Signed)
This encounter was created in error - please disregard.

## 2014-10-05 NOTE — Telephone Encounter (Signed)
ERROR

## 2014-10-16 DIAGNOSIS — B029 Zoster without complications: Secondary | ICD-10-CM | POA: Diagnosis not present

## 2014-10-30 ENCOUNTER — Ambulatory Visit (INDEPENDENT_AMBULATORY_CARE_PROVIDER_SITE_OTHER): Payer: Medicare Other | Admitting: *Deleted

## 2014-10-30 DIAGNOSIS — Z5181 Encounter for therapeutic drug level monitoring: Secondary | ICD-10-CM

## 2014-10-30 DIAGNOSIS — Z7901 Long term (current) use of anticoagulants: Secondary | ICD-10-CM | POA: Diagnosis not present

## 2014-10-30 DIAGNOSIS — I4891 Unspecified atrial fibrillation: Secondary | ICD-10-CM | POA: Diagnosis not present

## 2014-10-30 LAB — POCT INR: INR: 3.1

## 2014-11-14 ENCOUNTER — Encounter: Payer: Self-pay | Admitting: Cardiology

## 2014-11-14 ENCOUNTER — Ambulatory Visit (INDEPENDENT_AMBULATORY_CARE_PROVIDER_SITE_OTHER): Payer: Medicare Other | Admitting: Cardiology

## 2014-11-14 VITALS — BP 140/82 | HR 57 | Ht 60.0 in | Wt 118.2 lb

## 2014-11-14 DIAGNOSIS — I429 Cardiomyopathy, unspecified: Secondary | ICD-10-CM

## 2014-11-14 DIAGNOSIS — I4891 Unspecified atrial fibrillation: Secondary | ICD-10-CM | POA: Diagnosis not present

## 2014-11-14 DIAGNOSIS — R0602 Shortness of breath: Secondary | ICD-10-CM | POA: Diagnosis not present

## 2014-11-14 DIAGNOSIS — I10 Essential (primary) hypertension: Secondary | ICD-10-CM

## 2014-11-14 MED ORDER — METOPROLOL SUCCINATE ER 25 MG PO TB24: 37.5000 mg | ORAL_TABLET | Freq: Every day | ORAL | Status: DC

## 2014-11-14 NOTE — Patient Instructions (Signed)
Your physician recommends that you schedule a follow-up appointment in: mid February with Dr.Branch     INCREASE Toprol XL to 37.5 mg daily (take 1 and 1/2 tablets )     Your physician has requested that you have an echocardiogram. Echocardiography is a painless test that uses sound waves to create images of your heart. It provides your doctor with information about the size and shape of your heart and how well your heart's chambers and valves are working. This procedure takes approximately one hour. There are no restrictions for this procedure.      Thank you for choosing [facility] !

## 2014-11-14 NOTE — Progress Notes (Signed)
Clinical Summary [name] is a 80 y.o.female seen today for follow up of the following medical problems.   1. Cardiomyopathy  - possibly chemo induced, secondary to Adriamycin.  - LVEF 40% 12/09, increased to 50% in 01/2009. Recent echo 12/2013 LVEF 45-50%. - has noted some increased DOE and LE edema since last visit. No chest pain.  - compliant with meds   2. HTN  - does not check regularly at home  - compliant with meds   3. Paroxysmal afib  - rare palpitations, lasts short periods of time.   - she is on anticoagulation, followed here in coumadin clinic. Denies any bleeding issues   4. Lightheadedness  - history of positional vertigo, but also describes some occasional orthostatic like symptoms.  - she was orthostatic at her last clinic visit, we changed her lasix to every other day and symptoms improved.  .  5. Palpitations - feels daily, can occur at rest or with exertion but mainly with exertion. Rests for 20 minutes can improve.  - limited caffeine  Past Medical History  Diagnosis Date  . Cardiomyopathy 2009    possibly secondary to Adriamycin; EF 40% in 12/09 and 50% 3/10; pulmonary edema in 2009  . Chronic kidney disease (CKD), stage I     when on diuretic with creatinine of 1.6  . Hypertension   . Adenocarcinoma, breast     treated with lumpectomy, node dissection, chemo and RT  . Hearing impairment   . DJD (degenerative joint disease)     right knee and lumbosacral spine  . History of angina 2001    normal cornary arteries in 2001  . Macular degeneration, dry     + cataracts  . Chronic anticoagulation 04/16/2011  . Complication of anesthesia     patient vomits with demerol  . PONV (postoperative nausea and vomiting)   . Sick sinus syndrome     Paroxysmal to permanent AF; initially first degree AV block also noted  . Neuropathy     fingers and toes  . Ataxia   . Retinal macular atrophy      Allergies  Allergen Reactions  . Altace  [Ramipril] Rash     Current Outpatient Prescriptions  Medication Sig Dispense Refill  . acetaminophen (TYLENOL) 500 MG tablet Take 500 mg by mouth every 6 (six) hours as needed.    Marland Kitchen b complex vitamins tablet Take 1 tablet by mouth daily.    . cholecalciferol (VITAMIN D) 1000 UNITS tablet Take 1,000 Units by mouth daily.      . Coenzyme Q10 (CO Q-10) 300 MG CAPS Take 300 mg by mouth daily.      . furosemide (LASIX) 20 MG tablet Take 20 mg every other day 90 tablet 3  . glucosamine-chondroitin 500-400 MG tablet Take 1 tablet by mouth daily.     . LUTEIN PO Take 1 tablet by mouth daily.    . metoprolol succinate (TOPROL-XL) 25 MG 24 hr tablet Take 1 tablet (25 mg total) by mouth daily. 90 tablet 3  . Multiple Vitamin (MULITIVITAMIN WITH MINERALS) TABS Take 1 tablet by mouth daily.    . potassium chloride SA (KLOR-CON M20) 20 MEQ tablet Take 0.5 tablets (10 mEq total) by mouth daily. For potassium replacement. Only takes when taking Lasix 90 tablet 3  . tamoxifen (NOLVADEX) 10 MG tablet Take 1 tablet (10 mg) daily. 30 tablet 6  . traMADol (ULTRAM) 50 MG tablet Take 50 mg by mouth daily as  needed. Only takes if necessary    . valsartan (DIOVAN) 80 MG tablet Take 1 tablet (80 mg total) by mouth daily. 90 tablet 3  . warfarin (COUMADIN) 5 MG tablet Take 1 tablet (5 mg total) by mouth daily at 6 PM. 90 tablet 3  . zolpidem (AMBIEN) 5 MG tablet Take 0.5 tablets (2.5 mg total) by mouth at bedtime as needed. Sleep 30 tablet 1   No current facility-administered medications for this visit.     Past Surgical History  Procedure Laterality Date  . Appendectomy    . Dilation and curettage of uterus    . Middle ear surgery      Left  . Tonsillectomy    . Microdiscectomy lumbar  2009    lumbosacral spine  . Mastectomy partial / lumpectomy w/ axillary lymphadenectomy  2009    Carcinoma of the breast  . Colonoscopy  02/11/2012    Colonoscopy plus polypectomy x2, Rehman  . Port-a-cath removal    .  Cataract extraction w/phaco  10/11/2012    Procedure: CATARACT EXTRACTION PHACO AND INTRAOCULAR LENS PLACEMENT (IOC);  Surgeon: Williams Che, MD;  Location: [facility];  Service: Ophthalmology;  Laterality: Right;  CDE:10.82     Allergies  Allergen Reactions  . Altace [Ramipril] Rash      Family History  Problem Relation Age of Onset  . Heart failure Mother   . Sudden death Father 60     Social History [name] reports that she quit smoking about 58 years ago. She has never used smokeless tobacco. [name] reports that she drinks alcohol.   Review of Systems CONSTITUTIONAL: No weight loss, fever, chills, weakness or fatigue.  HEENT: Eyes: No visual loss, blurred vision, double vision or yellow sclerae.No hearing loss, sneezing, congestion, runny nose or sore throat.  SKIN: No rash or itching.  CARDIOVASCULAR: per HPI RESPIRATORY: No cough or sputum.  GASTROINTESTINAL: No anorexia, nausea, vomiting or diarrhea. No abdominal pain or blood.  GENITOURINARY: No burning on urination, no polyuria NEUROLOGICAL: No headache, dizziness, syncope, paralysis, ataxia, numbness or tingling in the extremities. No change in bowel or bladder control.  MUSCULOSKELETAL: No muscle, back pain, joint pain or stiffness.  LYMPHATICS: No enlarged nodes. No history of splenectomy.  PSYCHIATRIC: No history of depression or anxiety.  ENDOCRINOLOGIC: No reports of sweating, cold or heat intolerance. No polyuria or polydipsia.  Marland Kitchen   Physical Examination p 57 bp 140/82 Wt 118 lbs BMI 23 Gen: resting comfortably, no acute distress HEENT: no scleral icterus, pupils equal round and reactive, no palptable cervical adenopathy,  CV: RRR, 2/6 systolic murmur at apex, no JVD Resp: Clear to auscultation bilaterally GI: abdomen is soft, non-tender, non-distended, normal bowel sounds, no hepatosplenomegaly MSK: extremities are warm, no edema.  Skin: warm, no rash Neuro:  no focal deficits Psych:  appropriate affect   Diagnostic Studies 12/2013 Echo LVEF 45-50%, elevated LA pressure, mild AI, moderate MR, mod TR, PASP 46    Assessment and Plan  1. Cardiomyopathy  - previous history of LV systolic dysfunction thought to be related to Adriamycin, function improved to low normal - last echo 12/2013 LVEF 45-50% - reports some increased DOE ane LE edema since last visit. Given hx of systolic dysfunction as well as moderate MR will repeat echo  2. HTN  - at goal, continue current meds   3. Paroxysmal afib  - increased palpitations, will increase Toprol XL to 37.5 mg daily.   4. Mitral regurgitation - with recent  increase in DOE and LE edema will repeat echo   F/u mid February 2016      Arnoldo Lenis, M.D.

## 2014-11-15 ENCOUNTER — Ambulatory Visit (HOSPITAL_COMMUNITY)
Admission: RE | Admit: 2014-11-15 | Discharge: 2014-11-15 | Disposition: A | Discharge status: Home / Self Care | Payer: Medicare Other | Source: Ambulatory Visit | Attending: Cardiology | Admitting: Cardiology

## 2014-11-15 DIAGNOSIS — I4891 Unspecified atrial fibrillation: Secondary | ICD-10-CM | POA: Insufficient documentation

## 2014-11-15 DIAGNOSIS — R0602 Shortness of breath: Secondary | ICD-10-CM

## 2014-11-15 DIAGNOSIS — I351 Nonrheumatic aortic (valve) insufficiency: Secondary | ICD-10-CM | POA: Insufficient documentation

## 2014-11-15 DIAGNOSIS — I429 Cardiomyopathy, unspecified: Secondary | ICD-10-CM | POA: Diagnosis not present

## 2014-11-15 DIAGNOSIS — Z9221 Personal history of antineoplastic chemotherapy: Secondary | ICD-10-CM | POA: Insufficient documentation

## 2014-11-15 DIAGNOSIS — Z853 Personal history of malignant neoplasm of breast: Secondary | ICD-10-CM | POA: Diagnosis not present

## 2014-11-15 DIAGNOSIS — I10 Essential (primary) hypertension: Secondary | ICD-10-CM | POA: Diagnosis not present

## 2014-11-15 DIAGNOSIS — I340 Nonrheumatic mitral (valve) insufficiency: Secondary | ICD-10-CM | POA: Insufficient documentation

## 2014-11-15 DIAGNOSIS — Z87891 Personal history of nicotine dependence: Secondary | ICD-10-CM | POA: Diagnosis not present

## 2014-11-15 DIAGNOSIS — I059 Rheumatic mitral valve disease, unspecified: Secondary | ICD-10-CM | POA: Diagnosis not present

## 2014-11-15 NOTE — Progress Notes (Signed)
  Echocardiogram 2D Echocardiogram has been performed.  [facility], [address] 11/15/2014, 10:53 AM

## 2014-11-20 ENCOUNTER — Telehealth: Payer: Self-pay | Admitting: *Deleted

## 2014-11-20 NOTE — Telephone Encounter (Signed)
-----   Message from Arnoldo Lenis, MD sent at 11/20/2014 11:50 AM EST ----- Overall echo shows no significant changes. Her heart valve remains mild to moderately leaky. Pumping function of the heart overall stable  Zandra Abts MD

## 2014-11-20 NOTE — Telephone Encounter (Signed)
Pt husband made aware, will forward to Dr. Willey Blade

## 2014-11-22 ENCOUNTER — Ambulatory Visit (INDEPENDENT_AMBULATORY_CARE_PROVIDER_SITE_OTHER): Payer: Medicare Other | Admitting: *Deleted

## 2014-11-22 DIAGNOSIS — I4891 Unspecified atrial fibrillation: Secondary | ICD-10-CM

## 2014-11-22 DIAGNOSIS — Z7901 Long term (current) use of anticoagulants: Secondary | ICD-10-CM

## 2014-11-22 DIAGNOSIS — Z5181 Encounter for therapeutic drug level monitoring: Secondary | ICD-10-CM | POA: Diagnosis not present

## 2014-11-22 LAB — POCT INR: INR: 3

## 2014-12-25 ENCOUNTER — Other Ambulatory Visit (HOSPITAL_COMMUNITY): Payer: Self-pay

## 2014-12-25 DIAGNOSIS — M8580 Other specified disorders of bone density and structure, unspecified site: Secondary | ICD-10-CM

## 2014-12-25 DIAGNOSIS — C50919 Malignant neoplasm of unspecified site of unspecified female breast: Secondary | ICD-10-CM

## 2014-12-28 DIAGNOSIS — I509 Heart failure, unspecified: Secondary | ICD-10-CM | POA: Diagnosis not present

## 2014-12-28 DIAGNOSIS — Z79899 Other long term (current) drug therapy: Secondary | ICD-10-CM | POA: Diagnosis not present

## 2014-12-29 ENCOUNTER — Encounter (HOSPITAL_BASED_OUTPATIENT_CLINIC_OR_DEPARTMENT_OTHER): Payer: Medicare Other

## 2014-12-29 ENCOUNTER — Encounter (HOSPITAL_COMMUNITY): Payer: Self-pay | Admitting: Hematology & Oncology

## 2014-12-29 ENCOUNTER — Encounter (HOSPITAL_COMMUNITY): Payer: Medicare Other | Attending: Hematology & Oncology | Admitting: Hematology & Oncology

## 2014-12-29 VITALS — BP 123/53 | HR 74 | Temp 97.4°F | Resp 18 | Wt 116.8 lb

## 2014-12-29 DIAGNOSIS — N644 Mastodynia: Secondary | ICD-10-CM | POA: Diagnosis not present

## 2014-12-29 DIAGNOSIS — M2550 Pain in unspecified joint: Secondary | ICD-10-CM

## 2014-12-29 DIAGNOSIS — C50919 Malignant neoplasm of unspecified site of unspecified female breast: Secondary | ICD-10-CM

## 2014-12-29 DIAGNOSIS — M8580 Other specified disorders of bone density and structure, unspecified site: Secondary | ICD-10-CM | POA: Diagnosis not present

## 2014-12-29 DIAGNOSIS — R109 Unspecified abdominal pain: Secondary | ICD-10-CM | POA: Diagnosis not present

## 2014-12-29 DIAGNOSIS — R1010 Upper abdominal pain, unspecified: Secondary | ICD-10-CM

## 2014-12-29 LAB — COMPREHENSIVE METABOLIC PANEL
ALK PHOS: 102 U/L (ref 39–117)
ALT: 21 U/L (ref 0–35)
AST: 37 U/L (ref 0–37)
Albumin: 4.1 g/dL (ref 3.5–5.2)
Anion gap: 7 (ref 5–15)
BUN: 20 mg/dL (ref 6–23)
CALCIUM: 8.9 mg/dL (ref 8.4–10.5)
CO2: 28 mmol/L (ref 19–32)
Chloride: 107 mmol/L (ref 96–112)
Creatinine, Ser: 0.83 mg/dL (ref 0.50–1.10)
GFR calc non Af Amer: 65 mL/min — ABNORMAL LOW (ref >90)
GFR, EST AFRICAN AMERICAN: 75 mL/min — AB (ref >90)
GLUCOSE: 120 mg/dL — AB (ref 70–99)
POTASSIUM: 3.7 mmol/L (ref 3.5–5.1)
SODIUM: 142 mmol/L (ref 135–145)
Total Bilirubin: 1.2 mg/dL (ref 0.3–1.2)
Total Protein: 6.6 g/dL (ref 6.0–8.3)

## 2014-12-29 LAB — CBC WITH DIFFERENTIAL/PLATELET
BASOS ABS: 0.1 10*3/uL (ref 0.0–0.1)
Basophils Relative: 1 % (ref 0–1)
EOS ABS: 0.1 10*3/uL (ref 0.0–0.7)
EOS PCT: 2 % (ref 0–5)
HEMATOCRIT: 39.5 % (ref 36.0–46.0)
Hemoglobin: 12.9 g/dL (ref 12.0–15.0)
LYMPHS PCT: 25 % (ref 12–46)
Lymphs Abs: 1.2 10*3/uL (ref 0.7–4.0)
MCH: 34.5 pg — AB (ref 26.0–34.0)
MCHC: 32.7 g/dL (ref 30.0–36.0)
MCV: 105.6 fL — ABNORMAL HIGH (ref 78.0–100.0)
Monocytes Absolute: 0.5 10*3/uL (ref 0.1–1.0)
Monocytes Relative: 10 % (ref 3–12)
NEUTROS ABS: 2.9 10*3/uL (ref 1.7–7.7)
Neutrophils Relative %: 62 % (ref 43–77)
Platelets: 151 10*3/uL (ref 150–400)
RBC: 3.74 MIL/uL — AB (ref 3.87–5.11)
RDW: 14.2 % (ref 11.5–15.5)
WBC: 4.6 10*3/uL (ref 4.0–10.5)

## 2014-12-29 NOTE — Patient Instructions (Signed)
[facility] Discharge Instructions  RECOMMENDATIONS MADE BY THE CONSULTANT AND ANY TEST RESULTS WILL BE SENT TO YOUR REFERRING PHYSICIAN.  SPECIAL INSTRUCTIONS/FOLLOW-UP:  Please call prior to your next visit with any problems or concerns. We will see you back again in two months with an office visit. We will review your Breast Cancer Index results at your follow-up. We are going to refer you to Dr. Laural Golden and for a mammogram to Dr. Isaiah Blakes.    Thank you for choosing [facility] to provide your oncology and hematology care.  To afford each patient quality time with our providers, please arrive at least 15 minutes before your scheduled appointment time.  With your help, our goal is to use those 15 minutes to complete the necessary work-up to ensure our physicians have the information they need to help with your evaluation and healthcare recommendations.    Effective January 1st, 2014, we ask that you re-schedule your appointment with our physicians should you arrive 10 or more minutes late for your appointment.  We strive to give you quality time with our providers, and arriving late affects you and other patients whose appointments are after yours.    Again, thank you for choosing [facility].  Our hope is that these requests will decrease the amount of time that you wait before being seen by our physicians.       _____________________________________________________________  Should you have questions after your visit to [facility], please contact our office at [phone] between the hours of 8:30 a.m. and 5:00 p.m.  Voicemails left after 4:30 p.m. will not be returned until the following business day.  For prescription refill requests, have your pharmacy contact our office with your prescription refill request.

## 2014-12-29 NOTE — Progress Notes (Signed)
Alicia Harrington, [name]    DIAGNOSIS:  Stage II R breast cancer   Treated under NSABP 41 protocol. Could not tolerate Taxol plus   trastuzumab secondary to heart failure   June 2010 to August 2014 Tamoxifen   August 2014 to 12/30/2013 Arimidex   Tamoxifen 10 daily on 12/30/2013 by Dr. Stephenie Acres    DEXA on 09/2013 with osteopenia   CURRENT THERAPY:Tamoxifen 10 mg daily per Dr. Stephenie Acres  INTERVAL HISTORY: Alicia Harrington 81 y.o. female returns for follow-up of a stage II breast cancer of the R breast.  She complains of an episode of sharp L breast pain. It woke her up during the night. She is concerned as this is how her prior breast cancer presented.  She is concerned about weight loss. She states she has chronic abdominal pain that is exacerbated with eating. She has seen Dr. Laural Golden in the past and would like to see him again. She does not recall ever having an EGD.   MEDICAL HISTORY: Past Medical History  Diagnosis Date  . Cardiomyopathy 2009    possibly secondary to Adriamycin; EF 40% in 12/09 and 50% 3/10; pulmonary edema in 2009  . Chronic kidney disease (CKD), stage I     when on diuretic with creatinine of 1.6  . Hypertension   . Adenocarcinoma, breast     treated with lumpectomy, node dissection, chemo and RT  . Hearing impairment   . DJD (degenerative joint disease)     right knee and lumbosacral spine  . History of angina 2001    normal cornary arteries in 2001  . Macular degeneration, dry     + cataracts  . Chronic anticoagulation 04/16/2011  . Complication of anesthesia     patient vomits with demerol  . PONV (postoperative nausea and vomiting)   . Sick sinus syndrome     Paroxysmal to permanent AF; initially first degree AV block also noted  . Neuropathy     fingers and toes  . Ataxia   . Retinal macular atrophy     has HEARING IMPAIRMENT; HYPERTENSION; Atrial fibrillation; Chronic anticoagulation; Cardiomyopathy;  Sick sinus syndrome; Chronic kidney disease (CKD), stage I; Adenocarcinoma, breast; DJD (degenerative joint disease); History of angina; Macular degeneration, dry; Fasting hyperglycemia; Verruca vulgaris; Encounter for therapeutic drug monitoring; Mitral regurgitation; and Abdominal pain on her problem list.     is allergic to altace.  Alicia Harrington does not currently have medications on file.  SURGICAL HISTORY: Past Surgical History  Procedure Laterality Date  . Appendectomy    . Dilation and curettage of uterus    . Middle ear surgery      Left  . Tonsillectomy    . Microdiscectomy lumbar  2009    lumbosacral spine  . Mastectomy partial / lumpectomy w/ axillary lymphadenectomy  2009    Carcinoma of the breast  . Colonoscopy  02/11/2012    Colonoscopy plus polypectomy x2, Rehman  . Port-a-cath removal    . Cataract extraction w/phaco  10/11/2012    Procedure: CATARACT EXTRACTION PHACO AND INTRAOCULAR LENS PLACEMENT (IOC);  Surgeon: Williams Che, MD;  Location: [facility];  Service: Ophthalmology;  Laterality: Right;  CDE:10.82    SOCIAL HISTORY: History   Social History  . Marital Status: Married    Spouse Name: N/A  . Number of Children: N/A  . Years of Education: N/A   Occupational History  .      retired  Social History Main Topics  . Smoking status: Former Smoker    Start date: 11/24/1954    Quit date: 12/23/1955  . Smokeless tobacco: Never Used     Comment: quit at age 23  . Alcohol Use: 0.0 oz/week    0 Standard drinks or equivalent per week     Comment: one glass of wine per day  . Drug Use: No  . Sexual Activity: Not on file   Other Topics Concern  . Not on file   Social History Narrative   Pt lives in [place] with spouse.  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    FAMILY HISTORY: Family History  Problem Relation Age of Onset  . Heart failure Mother   . Sudden death Father 68    Review of Systems  Constitutional: Positive for  malaise/fatigue.  HENT: Negative.   Eyes: Negative.   Respiratory: Negative.   Cardiovascular: Negative.   Gastrointestinal: Negative.   Genitourinary: Negative.   Musculoskeletal: Positive for joint pain.  Skin: Negative.   Neurological: Negative.   Endo/Heme/Allergies: Negative.   Psychiatric/Behavioral: Negative.     PHYSICAL EXAMINATION  ECOG PERFORMANCE STATUS: 0 - Asymptomatic  Filed Vitals:   12/29/14 1011  BP: 123/53  Pulse: 74  Temp: 97.4 F (36.3 C)  Resp: 18    Physical Exam  Constitutional: She is oriented to person, place, and time and well-developed, well-nourished, and in no distress.  HENT:  Head: Normocephalic and atraumatic.  Nose: Nose normal.  Mouth/Throat: Oropharynx is clear and moist. No oropharyngeal exudate.  Eyes: Conjunctivae and EOM are normal. Pupils are equal, round, and reactive to light. Right eye exhibits no discharge. Left eye exhibits no discharge. No scleral icterus.  Neck: Normal range of motion. Neck supple. No tracheal deviation present. No thyromegaly present.  Cardiovascular: Normal rate, regular rhythm and normal heart sounds.  Exam reveals no gallop and no friction rub.   No murmur heard. Pulmonary/Chest: Effort normal and breath sounds normal. She has no wheezes. She has no rales.  Bilateral breast exam performed and unremarkable for palpable abnormalities, no nipple changes and no skin retraction or dimpling. Bilateral axillae are clear  Abdominal: Soft. Bowel sounds are normal. She exhibits no distension and no mass. There is no tenderness. There is no rebound and no guarding.  Musculoskeletal: Normal range of motion. She exhibits no edema.  Lymphadenopathy:    She has no cervical adenopathy.  Neurological: She is alert and oriented to person, place, and time. She has normal reflexes. No cranial nerve deficit. Gait normal. Coordination normal.  Skin: Skin is warm and dry. No rash noted.  Psychiatric: Mood, memory, affect and  judgment normal.  Nursing note and vitals reviewed.   LABORATORY DATA:  CBC    Component Value Date/Time   WBC 4.6 12/29/2014 0947   RBC 3.74* 12/29/2014 0947   HGB 12.9 12/29/2014 0947   HCT 39.5 12/29/2014 0947   PLT 151 12/29/2014 0947   MCV 105.6* 12/29/2014 0947   MCH 34.5* 12/29/2014 0947   MCHC 32.7 12/29/2014 0947   RDW 14.2 12/29/2014 0947   LYMPHSABS 1.2 12/29/2014 0947   MONOABS 0.5 12/29/2014 0947   EOSABS 0.1 12/29/2014 0947   BASOSABS 0.1 12/29/2014 0947   CMP     Component Value Date/Time   NA 142 12/29/2014 0947   K 3.7 12/29/2014 0947   CL 107 12/29/2014 0947   CO2 28 12/29/2014 0947   GLUCOSE 120* 12/29/2014 3557  BUN 20 12/29/2014 0947   CREATININE 0.83 12/29/2014 0947   CREATININE 0.98 04/07/2013 1154   CALCIUM 8.9 12/29/2014 0947   PROT 6.6 12/29/2014 0947   ALBUMIN 4.1 12/29/2014 0947   AST 37 12/29/2014 0947   ALT 21 12/29/2014 0947   ALKPHOS 102 12/29/2014 0947   BILITOT 1.2 12/29/2014 0947   GFRNONAA 65* 12/29/2014 0947   GFRAA 75* 12/29/2014 0947       ASSESSMENT and THERAPY PLAN:    Adenocarcinoma, breast Pleasant 81 year old female with a history of estrogen receptor positive HER-2 positive carcinoma of the right breast diagnosed back in 2010. She completed 5 years of endocrine therapy. She was started on 10 mg of tamoxifen in February of last year by Dr. Stephenie Acres.  I am not sure of the benefit of 10 mg of tamoxifen in regards to breast cancer prevention/treatment and I discussed this with her. I have recommended a breast cancer index to see if she would benefit from prolonged endocrine therapy. I discussed this test with she and her husband in detail.  She is concerned about left breast pain, her exam is unremarkable today. We will arrange for her mammogram as she is due soon. She gets these in [place]. I will bring her back in 2 months to review the results of her breast cancer index and to reassess how she is doing. We can make  additional recommendations regarding ongoing endocrine therapy at follow-up.   Abdominal pain She is complaining of abdominal pain that is worse with eating. This is been going on now for several months. She does not recall ever having an EGD. I discussed referring her to GI, she has seen Dr. Laural Golden in the past and therefore we will get her back to his office.     All questions were answered. The patient knows to call the clinic with any problems, questions or concerns. We can certainly see the patient much sooner if necessary.  Molli Hazard 01/11/2015

## 2014-12-29 NOTE — Progress Notes (Signed)
Labs for cbcd,cmp,[id number]

## 2014-12-30 LAB — CANCER ANTIGEN 27.29: CA 27.29: 9.3 U/mL (ref 0.0–38.6)

## 2014-12-30 LAB — FOLATE: Folate: >20 ng/mL

## 2014-12-30 LAB — VITAMIN B12: Vitamin B-12: 464 pg/mL (ref 211–911)

## 2014-12-30 LAB — VITAMIN D 25 HYDROXY (VIT D DEFICIENCY, FRACTURES): VIT D 25 HYDROXY: 42.5 ng/mL (ref 30.0–100.0)

## 2015-01-01 ENCOUNTER — Ambulatory Visit (INDEPENDENT_AMBULATORY_CARE_PROVIDER_SITE_OTHER): Payer: Medicare Other | Admitting: *Deleted

## 2015-01-01 ENCOUNTER — Telehealth (HOSPITAL_COMMUNITY): Payer: Self-pay | Admitting: *Deleted

## 2015-01-01 ENCOUNTER — Encounter (HOSPITAL_COMMUNITY): Payer: Self-pay

## 2015-01-01 DIAGNOSIS — Z7901 Long term (current) use of anticoagulants: Secondary | ICD-10-CM

## 2015-01-01 DIAGNOSIS — I4891 Unspecified atrial fibrillation: Secondary | ICD-10-CM | POA: Diagnosis not present

## 2015-01-01 DIAGNOSIS — I480 Paroxysmal atrial fibrillation: Secondary | ICD-10-CM | POA: Diagnosis not present

## 2015-01-01 DIAGNOSIS — I5022 Chronic systolic (congestive) heart failure: Secondary | ICD-10-CM | POA: Diagnosis not present

## 2015-01-01 DIAGNOSIS — Z5181 Encounter for therapeutic drug level monitoring: Secondary | ICD-10-CM | POA: Diagnosis not present

## 2015-01-01 LAB — POCT INR: INR: 3.2

## 2015-01-01 NOTE — Telephone Encounter (Signed)
Tammy @ Pathology notified that Dr. Whitney Muse did indeed want the tissue sent to Breast Index even though there may be the possibility that there is insufficient tissue for testing.

## 2015-01-03 ENCOUNTER — Encounter: Payer: Self-pay | Admitting: Cardiology

## 2015-01-03 ENCOUNTER — Ambulatory Visit (INDEPENDENT_AMBULATORY_CARE_PROVIDER_SITE_OTHER): Payer: Medicare Other | Admitting: Cardiology

## 2015-01-03 VITALS — BP 132/62 | HR 64 | Ht 60.0 in | Wt 116.8 lb

## 2015-01-03 DIAGNOSIS — I340 Nonrheumatic mitral (valve) insufficiency: Secondary | ICD-10-CM | POA: Diagnosis not present

## 2015-01-03 DIAGNOSIS — I480 Paroxysmal atrial fibrillation: Secondary | ICD-10-CM

## 2015-01-03 DIAGNOSIS — Z7901 Long term (current) use of anticoagulants: Secondary | ICD-10-CM

## 2015-01-03 DIAGNOSIS — I5022 Chronic systolic (congestive) heart failure: Secondary | ICD-10-CM

## 2015-01-03 MED ORDER — DIGOXIN 125 MCG PO TABS: 0.1250 mg | ORAL_TABLET | Freq: Every day | ORAL | Status: DC

## 2015-01-03 MED ORDER — METOPROLOL SUCCINATE ER 50 MG PO TB24: 50.0000 mg | ORAL_TABLET | Freq: Every day | ORAL | Status: DC

## 2015-01-03 NOTE — Progress Notes (Signed)
Clinical Summary Alicia Harrington is a 81 y.o.female seen today for follow up of the following medical problems.   1. Cardiomyopathy  - possibly chemo induced, secondary to Adriamycin.  - LVEF 40% 12/09, increased to 50% in 01/2009. Recent echo 10/2014 LVEF 40-45%.  - last visit noted some increased DOE and LE edema. Responds to increased lasix as needed.   - compliant with meds   2. HTN  - does not check regularly at home  - compliant with meds   3. Paroxysmal afib  - palpitations somewhat improved since last visit after increasing Toprol XL to 37.5mg  daily   - she is on anticoagulation, followed here in coumadin clinic. Denies any bleeding issues .   Past Medical History  Diagnosis Date  . Cardiomyopathy 2009    possibly secondary to Adriamycin; EF 40% in 12/09 and 50% 3/10; pulmonary edema in 2009  . Chronic kidney disease (CKD), stage I     when on diuretic with creatinine of 1.6  . Hypertension   . Adenocarcinoma, breast     treated with lumpectomy, node dissection, chemo and RT  . Hearing impairment   . DJD (degenerative joint disease)     right knee and lumbosacral spine  . History of angina 2001    normal cornary arteries in 2001  . Macular degeneration, dry     + cataracts  . Chronic anticoagulation 04/16/2011  . Complication of anesthesia     patient vomits with demerol  . PONV (postoperative nausea and vomiting)   . Sick sinus syndrome     Paroxysmal to permanent AF; initially first degree AV block also noted  . Neuropathy     fingers and toes  . Ataxia   . Retinal macular atrophy      Allergies  Allergen Reactions  . Altace [Ramipril] Rash     Current Outpatient Prescriptions  Medication Sig Dispense Refill  . acetaminophen (TYLENOL) 500 MG tablet Take 500 mg by mouth every 6 (six) hours as needed.    Marland Kitchen b complex vitamins tablet Take 1 tablet by mouth daily.    . cholecalciferol (VITAMIN D) 1000 UNITS tablet Take 1,000 Units by mouth  daily.      . furosemide (LASIX) 20 MG tablet Take 20 mg every other day 90 tablet 3  . LUTEIN PO Take 1 tablet by mouth daily. Taking the AR 2.    . metoprolol succinate (TOPROL-XL) 25 MG 24 hr tablet Take 1.5 tablets (37.5 mg total) by mouth daily. 135 tablet 3  . Multiple Vitamin (MULITIVITAMIN WITH MINERALS) TABS Take 1 tablet by mouth daily.    . potassium chloride SA (KLOR-CON M20) 20 MEQ tablet Take 0.5 tablets (10 mEq total) by mouth daily. For potassium replacement. Only takes when taking Lasix 90 tablet 3  . tamoxifen (NOLVADEX) 10 MG tablet Take 1 tablet (10 mg) daily. 30 tablet 6  . traMADol (ULTRAM) 50 MG tablet Take 50 mg by mouth daily as needed. Only takes if necessary    . valsartan (DIOVAN) 80 MG tablet Take 1 tablet (80 mg total) by mouth daily. 90 tablet 3  . warfarin (COUMADIN) 5 MG tablet Take 1 tablet (5 mg total) by mouth daily at 6 PM. 90 tablet 3  . zolpidem (AMBIEN) 5 MG tablet Take 0.5 tablets (2.5 mg total) by mouth at bedtime as needed. Sleep 30 tablet 1   No current facility-administered medications for this visit.     Past Surgical History  Procedure Laterality Date  . Appendectomy    . Dilation and curettage of uterus    . Middle ear surgery      Left  . Tonsillectomy    . Microdiscectomy lumbar  2009    lumbosacral spine  . Mastectomy partial / lumpectomy w/ axillary lymphadenectomy  2009    Carcinoma of the breast  . Colonoscopy  02/11/2012    Colonoscopy plus polypectomy x2, Rehman  . Port-a-cath removal    . Cataract extraction w/phaco  10/11/2012    Procedure: CATARACT EXTRACTION PHACO AND INTRAOCULAR LENS PLACEMENT (IOC);  Surgeon: Williams Che, MD;  Location: [facility];  Service: Ophthalmology;  Laterality: Right;  CDE:10.82     Allergies  Allergen Reactions  . Altace [Ramipril] Rash      Family History  Problem Relation Age of Onset  . Heart failure Mother   . Sudden death Father 59     Social History Ms. Friedhoff reports that  she quit smoking about 59 years ago. She has never used smokeless tobacco. Ms. Hechavarria reports that she drinks alcohol.   Review of Systems CONSTITUTIONAL: No weight loss, fever, chills, weakness or fatigue.  HEENT: Eyes: No visual loss, blurred vision, double vision or yellow sclerae.No hearing loss, sneezing, congestion, runny nose or sore throat.  SKIN: No rash or itching.  CARDIOVASCULAR: per HPI RESPIRATORY: No shortness of breath, cough or sputum.  GASTROINTESTINAL: No anorexia, nausea, vomiting or diarrhea. No abdominal pain or blood.  GENITOURINARY: No burning on urination, no polyuria NEUROLOGICAL: No headache, dizziness, syncope, paralysis, ataxia, numbness or tingling in the extremities. No change in bowel or bladder control.  MUSCULOSKELETAL: No muscle, back pain, joint pain or stiffness.  LYMPHATICS: No enlarged nodes. No history of splenectomy.  PSYCHIATRIC: No history of depression or anxiety.  ENDOCRINOLOGIC: No reports of sweating, cold or heat intolerance. No polyuria or polydipsia.  Marland Kitchen   Physical Examination p 64 bp 132/62 Wt 116 lbs BMI 23 Gen: resting comfortably, no acute distress HEENT: no scleral icterus, pupils equal round and reactive, no palptable cervical adenopathy,  CV: RRR, no m/r/g, no JVD, no carotid bruits Resp: Clear to auscultation bilaterally GI: abdomen is soft, non-tender, non-distended, normal bowel sounds, no hepatosplenomegaly MSK: extremities are warm, no edema.  Skin: warm, no rash Neuro:  no focal deficits Psych: appropriate affect   Diagnostic Studies 12/2013 Echo LVEF 45-50%, elevated LA pressure, mild AI, moderate MR, mod TR, PASP 46  10/2014 Echo Study Conclusions  - Left ventricle: The cavity size was normal. Wall thickness was normal. Systolic function was mildly to moderately reduced. The estimated ejection fraction was in the range of 40% to 45%. - Aortic valve: There was mild regurgitation. Regurgitation pressure  half-time: 794 ms. - Mitral valve: Mildly calcified annulus. There was mild to moderate regurgitation. MR vena contracta is 0.4 cm. - Left atrium: The atrium was severely dilated. - Right ventricle: The interventricular septum is flattened in systole and diastole, consistent with RV pressure and volume overload. The cavity size was mildly dilated. Systolic function was low normal. RV TAPSE is 1.6 cm. - Right atrium: The atrium was severely dilated. - Atrial septum: No defect or patent foramen ovale was identified. - Pulmonary arteries: Systolic pressure was moderately increased. PA peak pressure: 60 mm Hg (S). - Technically adequate study.  Assessment and Plan   1. Chronic systolic HF - previous history of LV systolic dysfunction thought to be related to Adriamycin - last echo 10/2014 LVEF 40-45% -  appears euvolemic today. Has had orthostatic symptoms with daily lasix before, continue prn - increase Toprol XL to 50mg  daily.   2. HTN  - at goal, continue current meds   3. Paroxysmal afib  - decrease dig to 0.125mg  daily since increasing Toprol XL to 50mg  daily.   4. Mitral regurgitation - stable by recent echo 10/2014, mild to moderate - continue to follow  F/u Mid May 2016   Arnoldo Lenis, M.D.

## 2015-01-03 NOTE — Patient Instructions (Signed)
Your physician recommends that you schedule a follow-up appointment in: mid May with Dr Harl Bowie     Your physician has recommended you make the following change in your medication:     DECREASE Digoxin to 0.125 mg daily   INCREASE Toprol to 50 mg daily      Thank you for choosing [facility] !

## 2015-01-05 ENCOUNTER — Encounter (HOSPITAL_COMMUNITY): Payer: Self-pay

## 2015-01-08 ENCOUNTER — Ambulatory Visit (INDEPENDENT_AMBULATORY_CARE_PROVIDER_SITE_OTHER): Payer: Medicare Other | Admitting: Internal Medicine

## 2015-01-09 ENCOUNTER — Ambulatory Visit (INDEPENDENT_AMBULATORY_CARE_PROVIDER_SITE_OTHER): Payer: Medicare Other | Admitting: Internal Medicine

## 2015-01-09 ENCOUNTER — Encounter (INDEPENDENT_AMBULATORY_CARE_PROVIDER_SITE_OTHER): Payer: Self-pay | Admitting: *Deleted

## 2015-01-09 ENCOUNTER — Encounter (INDEPENDENT_AMBULATORY_CARE_PROVIDER_SITE_OTHER): Payer: Self-pay | Admitting: Internal Medicine

## 2015-01-09 VITALS — BP 116/74 | HR 88 | Temp 97.1°F | Resp 18 | Ht 60.0 in | Wt 116.1 lb

## 2015-01-09 DIAGNOSIS — R1310 Dysphagia, unspecified: Secondary | ICD-10-CM

## 2015-01-09 DIAGNOSIS — K589 Irritable bowel syndrome without diarrhea: Secondary | ICD-10-CM

## 2015-01-09 DIAGNOSIS — R1319 Other dysphagia: Secondary | ICD-10-CM

## 2015-01-09 DIAGNOSIS — R1314 Dysphagia, pharyngoesophageal phase: Secondary | ICD-10-CM | POA: Diagnosis not present

## 2015-01-09 MED ORDER — HYOSCYAMINE SULFATE 0.125 MG SL SUBL: 0.1250 mg | SUBLINGUAL_TABLET | Freq: Three times a day (TID) | SUBLINGUAL | Status: DC

## 2015-01-09 MED ORDER — ALIGN PO CAPS: 1.0000 | ORAL_CAPSULE | Freq: Every day | ORAL | Status: DC

## 2015-01-09 MED ORDER — HYOSCYAMINE SULFATE 0.125 MG/5ML PO ELIX: 0.1250 mg | ORAL_SOLUTION | Freq: Three times a day (TID) | ORAL | Status: DC

## 2015-01-09 NOTE — Progress Notes (Signed)
Presenting complaint;  Abdominal pain and dysphagia.  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 Viviano Simas. Patient was last seen in March 2013 when she had colonoscopy with polypectomy. She presents with 2 complaints. She has had solid food dysphagia ever since she had chemoradiation therapy for breast carcinoma. She believes she is having more difficulty with solids lately than she's had before. She has not experienced any episode of food impaction or regurgitation. She has had some hoarseness but denies sore throat or lump in her throat. She is also available the fact that she eats fast and does not chew her food thoroughly. She denies heartburn. Her appetite is fair. Her husband states that over the last few years she has lost 10 pounds. She had she has gained 3 pounds in the last 3 years. She also complains of intermittent abdominal pain which she describes as "stomach ache". Her husband states that she has very sensitive gastrocolic reflex. This discomfort is more or less generalized. It usually is triggered with meals but sometimes even when she drinks a glass of water. She has urgency and at times she has bowel movement and she feels better. She may have slight nausea when she has this discomfort. She states this symptom is very unpredictable when she travels she takes Imodium which seemed to help. She denies melena or rectal bleeding. She gives history of PND and orthopnea but hasn't had it recently. She exercises daily. She saw Dr. Whitney Muse recently and she is having some more tests done on saved breast tissue/tumor to determine if she could come off tamoxifen. She has not smoked in 59 years. She drinks one glass of wine every evening.   Current Medications: Outpatient Encounter Prescriptions as of 01/09/2015  Medication Sig  . acetaminophen (TYLENOL) 500 MG tablet Take 500 mg by mouth  every 6 (six) hours as needed.  Marland Kitchen b complex vitamins tablet Take 1 tablet by mouth daily.  . cholecalciferol (VITAMIN D) 1000 UNITS tablet Take 1,000 Units by mouth daily.    . Cyanocobalamin (VITAMIN B 12 PO) Take 1,000 mcg by mouth daily.  . digoxin (LANOXIN) 0.125 MG tablet Take 1 tablet (0.125 mg total) by mouth daily.  . furosemide (LASIX) 20 MG tablet Take 20 mg every other day  . LUTEIN PO Take 2 tablets by mouth daily. Taking the AR 2.  . metoprolol succinate (TOPROL-XL) 50 MG 24 hr tablet Take 1 tablet (50 mg total) by mouth daily.  . Multiple Vitamin (MULITIVITAMIN WITH MINERALS) TABS Take 1 tablet by mouth daily.  . potassium chloride SA (KLOR-CON M20) 20 MEQ tablet Take 0.5 tablets (10 mEq total) by mouth daily. For potassium replacement. Only takes when taking Lasix  . tamoxifen (NOLVADEX) 10 MG tablet Take 1 tablet (10 mg) daily.  . traMADol (ULTRAM) 50 MG tablet Take 50 mg by mouth daily as needed. Only takes if necessary  . valsartan (DIOVAN) 80 MG tablet Take 1 tablet (80 mg total) by mouth daily.  Marland Kitchen warfarin (COUMADIN) 5 MG tablet Take 1 tablet (5 mg total) by mouth daily at 6 PM.  . zolpidem (AMBIEN) 5 MG tablet Take 0.5 tablets (2.5 mg total) by mouth at bedtime as needed. Sleep   Past Medical History  Diagnosis Date  . Cardiomyopathy 2009    possibly secondary to Adriamycin; EF 40% in 12/09 and 50% 3/10; pulmonary edema in 2009  .  Chronic kidney disease (CKD), stage I     when on diuretic with creatinine of 1.6  . Hypertension   . Adenocarcinoma, breast     treated with lumpectomy, node dissection, chemo and RT  . Hearing impairment   . DJD (degenerative joint disease)     right knee and lumbosacral spine  . History of angina 2001    normal cornary arteries in 2001  . Macular degeneration, dry     + cataracts  . Chronic anticoagulation 04/16/2011  . Complication of anesthesia     patient vomits with demerol  . PONV (postoperative nausea and vomiting)   . Sick  sinus syndrome     Paroxysmal to permanent AF; initially first degree AV block also noted  . Neuropathy     fingers and toes  . Ataxia   . Retinal macular atrophy    Past Surgical History  Procedure Laterality Date  . Appendectomy    . Dilation and curettage of uterus    . Middle ear surgery      Left  . Tonsillectomy    . Microdiscectomy lumbar  2009    lumbosacral spine  . Mastectomy partial / lumpectomy w/ axillary lymphadenectomy  2009    Carcinoma of the breast  . Colonoscopy  02/11/2012    Colonoscopy plus polypectomy x2, Johnanna Bakke  . Port-a-cath removal    . Cataract extraction w/phaco  10/11/2012    Procedure: CATARACT EXTRACTION PHACO AND INTRAOCULAR LENS PLACEMENT (IOC);  Surgeon: Williams Che, MD;  Location: [facility];  Service: Ophthalmology;  Laterality: Right;  CDE:10.82      Objective: Blood pressure 116/74, pulse 88, temperature 97.1 F (36.2 C), resp. rate 18, height 5' (1.524 m), weight 116 lb 1.6 oz (52.663 kg). Patient is alert and in no acute distress.  Conjunctiva is pink. Sclera is nonicteric Oropharyngeal mucosa is normal. No neck masses or thyromegaly noted. Cardiac exam with irregular rhythm normal S1 and S2. Faint systolic ejection murmur noted at aortic area.  Lungs are clear to auscultation. Abdomen is flat. Bowel sounds are normal. On palpation abdomen is soft and nontender without organomegaly or masses.  No LE edema or clubbing noted.  Labs/studies Results: Lab data from 12/29/2014  WBC 4.6, H&H 12.9 and 39.5 and platelet count 151K  MCV 105.6   serum sodium 142, potassium 3.7, chloride 107, CO2 28, BUN 20, creatinine 0.83 and glucose 120 Bilirubin 1.2, AP 102, AST 37, ALT 21, total protein 6.6 and albumin 4.1. B12 level was 464 pg/mL   Assessment:  #1. Solid food dysphagia. She is had this symptom for over 5 years. She does not have chronic heartburn. She could have esophageal motility disorder or Schatzki's ring. I doubt that her  dysphagia has anything to do with prior radiation therapy. #2. Irritable bowel syndrome. She has typical symptoms and may benefit from short acting antacid osmotic if she is able to tolerated. #3. History of colonic adenomas. She had two tubular adenomas removed in March 2013. Next colonoscopy would be in March 2018.   Recommendations;  Hyoscyamine sublingual 1 tablet by mouth before meals when necessary. Align 1 capsule by mouth daily Barium pill esophagogram.

## 2015-01-09 NOTE — Patient Instructions (Signed)
Keep symptom diary as to frequency of abdominal pain and urgency. Physician will call with results of barium study

## 2015-01-10 ENCOUNTER — Encounter (INDEPENDENT_AMBULATORY_CARE_PROVIDER_SITE_OTHER): Payer: Self-pay | Admitting: Internal Medicine

## 2015-01-11 DIAGNOSIS — C50919 Malignant neoplasm of unspecified site of unspecified female breast: Secondary | ICD-10-CM | POA: Diagnosis not present

## 2015-01-11 DIAGNOSIS — R109 Unspecified abdominal pain: Secondary | ICD-10-CM | POA: Insufficient documentation

## 2015-01-11 NOTE — Assessment & Plan Note (Signed)
Pleasant 81 year old female with a history of estrogen receptor positive HER-2 positive carcinoma of the right breast diagnosed back in 2010. She completed 5 years of endocrine therapy. She was started on 10 mg of tamoxifen in February of last year by Dr. Stephenie Acres.  I am not sure of the benefit of 10 mg of tamoxifen in regards to breast cancer prevention/treatment and I discussed this with her. I have recommended a breast cancer index to see if she would benefit from prolonged endocrine therapy. I discussed this test with she and her husband in detail.  She is concerned about left breast pain, her exam is unremarkable today. We will arrange for her mammogram as she is due soon. She gets these in [place]. I will bring her back in 2 months to review the results of her breast cancer index and to reassess how she is doing. We can make additional recommendations regarding ongoing endocrine therapy at follow-up.

## 2015-01-11 NOTE — Assessment & Plan Note (Signed)
She is complaining of abdominal pain that is worse with eating. This is been going on now for several months. She does not recall ever having an EGD. I discussed referring her to GI, she has seen Dr. Laural Golden in the past and therefore we will get her back to his office.

## 2015-01-12 ENCOUNTER — Ambulatory Visit (HOSPITAL_COMMUNITY)
Admission: RE | Admit: 2015-01-12 | Discharge: 2015-01-12 | Disposition: A | Discharge status: Home / Self Care | Payer: Medicare Other | Source: Ambulatory Visit | Attending: Internal Medicine | Admitting: Internal Medicine

## 2015-01-12 DIAGNOSIS — R1310 Dysphagia, unspecified: Secondary | ICD-10-CM

## 2015-01-12 DIAGNOSIS — R1319 Other dysphagia: Secondary | ICD-10-CM

## 2015-01-12 DIAGNOSIS — R1314 Dysphagia, pharyngoesophageal phase: Secondary | ICD-10-CM | POA: Insufficient documentation

## 2015-01-15 DIAGNOSIS — L821 Other seborrheic keratosis: Secondary | ICD-10-CM | POA: Diagnosis not present

## 2015-01-15 DIAGNOSIS — D2271 Melanocytic nevi of right lower limb, including hip: Secondary | ICD-10-CM | POA: Diagnosis not present

## 2015-01-15 DIAGNOSIS — L814 Other melanin hyperpigmentation: Secondary | ICD-10-CM | POA: Diagnosis not present

## 2015-01-16 ENCOUNTER — Other Ambulatory Visit (INDEPENDENT_AMBULATORY_CARE_PROVIDER_SITE_OTHER): Payer: Self-pay | Admitting: *Deleted

## 2015-01-16 ENCOUNTER — Telehealth: Payer: Self-pay | Admitting: Cardiology

## 2015-01-16 ENCOUNTER — Telehealth: Payer: Self-pay | Admitting: *Deleted

## 2015-01-16 DIAGNOSIS — R1310 Dysphagia, unspecified: Secondary | ICD-10-CM

## 2015-01-16 DIAGNOSIS — R933 Abnormal findings on diagnostic imaging of other parts of digestive tract: Secondary | ICD-10-CM

## 2015-01-16 NOTE — Telephone Encounter (Signed)
Spoke with patient.  Told her Dr Harl Bowie had reviewed her chart and said it is OK for her to hold coumadin for 5 days without Lovenox bridging for EGD on 2/26 by Dr Laural Golden.  Pt took last dose of coumadin 2/21.  She will restart coumadin night of procedure (if OK with Dr Laural Golden) taking 7.5mg  x 2 days then 5mg  daily until INR check on 2/29.

## 2015-01-16 NOTE — Telephone Encounter (Signed)
See note from today 2/23 explaining questions to wife.

## 2015-01-16 NOTE — Telephone Encounter (Signed)
Informed patient and coumadin instructions given.  See note from 2/23.

## 2015-01-16 NOTE — Telephone Encounter (Signed)
-----   Message from Arnoldo Lenis, MD sent at 01/15/2015  1:32 PM EST ----- Lattie Haw, this patient is to have an EGD and is on coumadin. Can you help coordinate, there is no strong indication for bridging. Would hold 5 days prior to procedure and resume day after procedure.   Zandra Abts MD

## 2015-01-16 NOTE — Telephone Encounter (Signed)
Pt husband is calling to speak with Dr Harl Bowie about bridging coumadin. He states that he was expecting a call yesterday.

## 2015-01-16 NOTE — Telephone Encounter (Signed)
Wants return phone call / tgs

## 2015-01-17 ENCOUNTER — Encounter (HOSPITAL_COMMUNITY): Payer: Self-pay

## 2015-01-18 MED ORDER — SODIUM CHLORIDE 0.9 % IV SOLN: INTRAVENOUS | Status: DC

## 2015-01-19 ENCOUNTER — Encounter (HOSPITAL_COMMUNITY)
Admission: RE | Disposition: A | Discharge status: Home / Self Care | Payer: Self-pay | Source: Ambulatory Visit | Attending: Internal Medicine

## 2015-01-19 ENCOUNTER — Encounter (HOSPITAL_COMMUNITY): Payer: Self-pay | Admitting: *Deleted

## 2015-01-19 ENCOUNTER — Ambulatory Visit (HOSPITAL_COMMUNITY)
Admission: RE | Admit: 2015-01-19 | Discharge: 2015-01-19 | Disposition: A | Discharge status: Home / Self Care | Payer: Medicare Other | Source: Ambulatory Visit | Attending: Internal Medicine | Admitting: Internal Medicine

## 2015-01-19 DIAGNOSIS — Z87891 Personal history of nicotine dependence: Secondary | ICD-10-CM | POA: Diagnosis not present

## 2015-01-19 DIAGNOSIS — M1990 Unspecified osteoarthritis, unspecified site: Secondary | ICD-10-CM | POA: Diagnosis not present

## 2015-01-19 DIAGNOSIS — K2960 Other gastritis without bleeding: Secondary | ICD-10-CM | POA: Diagnosis not present

## 2015-01-19 DIAGNOSIS — R933 Abnormal findings on diagnostic imaging of other parts of digestive tract: Secondary | ICD-10-CM | POA: Diagnosis not present

## 2015-01-19 DIAGNOSIS — Z888 Allergy status to other drugs, medicaments and biological substances status: Secondary | ICD-10-CM | POA: Diagnosis not present

## 2015-01-19 DIAGNOSIS — K31819 Angiodysplasia of stomach and duodenum without bleeding: Secondary | ICD-10-CM | POA: Insufficient documentation

## 2015-01-19 DIAGNOSIS — K3189 Other diseases of stomach and duodenum: Secondary | ICD-10-CM | POA: Diagnosis not present

## 2015-01-19 DIAGNOSIS — Z7901 Long term (current) use of anticoagulants: Secondary | ICD-10-CM | POA: Insufficient documentation

## 2015-01-19 DIAGNOSIS — N181 Chronic kidney disease, stage 1: Secondary | ICD-10-CM | POA: Diagnosis not present

## 2015-01-19 DIAGNOSIS — Z853 Personal history of malignant neoplasm of breast: Secondary | ICD-10-CM | POA: Diagnosis not present

## 2015-01-19 DIAGNOSIS — I129 Hypertensive chronic kidney disease with stage 1 through stage 4 chronic kidney disease, or unspecified chronic kidney disease: Secondary | ICD-10-CM | POA: Diagnosis not present

## 2015-01-19 DIAGNOSIS — K210 Gastro-esophageal reflux disease with esophagitis: Secondary | ICD-10-CM | POA: Diagnosis not present

## 2015-01-19 DIAGNOSIS — K208 Other esophagitis: Secondary | ICD-10-CM | POA: Diagnosis not present

## 2015-01-19 DIAGNOSIS — H3530 Unspecified macular degeneration: Secondary | ICD-10-CM | POA: Insufficient documentation

## 2015-01-19 DIAGNOSIS — K2901 Acute gastritis with bleeding: Secondary | ICD-10-CM | POA: Diagnosis not present

## 2015-01-19 DIAGNOSIS — R1310 Dysphagia, unspecified: Secondary | ICD-10-CM

## 2015-01-19 DIAGNOSIS — I429 Cardiomyopathy, unspecified: Secondary | ICD-10-CM | POA: Insufficient documentation

## 2015-01-19 HISTORY — PX: ESOPHAGOGASTRODUODENOSCOPY: SHX5428

## 2015-01-19 HISTORY — PX: ESOPHAGEAL DILATION: SHX303

## 2015-01-19 SURGERY — EGD (ESOPHAGOGASTRODUODENOSCOPY)
Anesthesia: Moderate Sedation

## 2015-01-19 MED ORDER — MEPERIDINE HCL 50 MG/ML IJ SOLN
INTRAMUSCULAR | Status: DC | PRN
  Administered 2015-01-19: 25 mg via INTRAVENOUS

## 2015-01-19 MED ORDER — SODIUM CHLORIDE 0.9 % IV SOLN
INTRAVENOUS | Status: DC
  Administered 2015-01-19: 1000 mL via INTRAVENOUS

## 2015-01-19 MED ORDER — MEPERIDINE HCL 50 MG/ML IJ SOLN
INTRAMUSCULAR | Status: AC
  Filled 2015-01-19: qty 1

## 2015-01-19 MED ORDER — PANTOPRAZOLE SODIUM 40 MG PO TBEC: 40.0000 mg | DELAYED_RELEASE_TABLET | Freq: Every day | ORAL | Status: DC

## 2015-01-19 MED ORDER — MIDAZOLAM HCL 5 MG/5ML IJ SOLN
INTRAMUSCULAR | Status: AC
  Filled 2015-01-19: qty 10

## 2015-01-19 MED ORDER — BUTAMBEN-TETRACAINE-BENZOCAINE 2-2-14 % EX AERO
INHALATION_SPRAY | CUTANEOUS | Status: DC | PRN
  Administered 2015-01-19: 2 via TOPICAL

## 2015-01-19 MED ORDER — MIDAZOLAM HCL 5 MG/5ML IJ SOLN
INTRAMUSCULAR | Status: DC | PRN
  Administered 2015-01-19: 1 mg via INTRAVENOUS
  Administered 2015-01-19: 2 mg via INTRAVENOUS
  Administered 2015-01-19: 1 mg via INTRAVENOUS

## 2015-01-19 NOTE — H&P (Signed)
Alicia Harrington is an 81 y.o. female.   Chief Complaint: Patient's here for EGD and possible ED. HPI: Patient is 81 year old Caucasian female who presents with history of intermittent solid food dysphagia ever since she had treatment for breast CA. She denies frequent heartburn nausea vomiting. She had barium study last week which revealed mucosal abnormality distally suspicious for an ulcer. No stricture was identified. She is therefore undergoing diagnostic EGD. Esophagus dilated if indicated. Patient has been off warfarin for 5 days.  Past Medical History  Diagnosis Date  . Cardiomyopathy 2009    possibly secondary to Adriamycin; EF 40% in 12/09 and 50% 3/10; pulmonary edema in 2009  . Chronic kidney disease (CKD), stage I     when on diuretic with creatinine of 1.6  . Hypertension   . Adenocarcinoma, breast     treated with lumpectomy, node dissection, chemo and RT  . Hearing impairment   . DJD (degenerative joint disease)     right knee and lumbosacral spine  . History of angina 2001    normal cornary arteries in 2001  . Macular degeneration, dry     + cataracts  . Chronic anticoagulation 04/16/2011  . Complication of anesthesia     patient vomits with demerol  . PONV (postoperative nausea and vomiting)   . Sick sinus syndrome     Paroxysmal to permanent AF; initially first degree AV block also noted  . Neuropathy     fingers and toes  . Ataxia   . Retinal macular atrophy     Past Surgical History  Procedure Laterality Date  . Appendectomy    . Dilation and curettage of uterus    . Middle ear surgery      Left  . Tonsillectomy    . Microdiscectomy lumbar  2009    lumbosacral spine  . Mastectomy partial / lumpectomy w/ axillary lymphadenectomy  2009    Carcinoma of the breast  . Colonoscopy  02/11/2012    Colonoscopy plus polypectomy x2, Rehman  . Port-a-cath removal    . Cataract extraction w/phaco  10/11/2012    Procedure: CATARACT EXTRACTION PHACO AND  INTRAOCULAR LENS PLACEMENT (IOC);  Surgeon: Williams Che, MD;  Location: [facility];  Service: Ophthalmology;  Laterality: Right;  CDE:10.82    Family History  Problem Relation Age of Onset  . Heart failure Mother   . Sudden death Father 57   Social History:  reports that she quit smoking about 59 years ago. She started smoking about 60 years ago. She has never used smokeless tobacco. She reports that she drinks alcohol. She reports that she does not use illicit drugs.  Allergies:  Allergies  Allergen Reactions  . Altace [Ramipril] Rash    Medications Prior to Admission  Medication Sig Dispense Refill  . b complex vitamins tablet Take 1 tablet by mouth daily.    . bifidobacterium infantis (ALIGN) capsule Take 1 capsule by mouth daily. 14 capsule 0  . cholecalciferol (VITAMIN D) 1000 UNITS tablet Take 1,000 Units by mouth daily.      . Cyanocobalamin (VITAMIN B 12 PO) Take 1,000 mcg by mouth daily.    . digoxin (LANOXIN) 0.125 MG tablet Take 1 tablet (0.125 mg total) by mouth daily. 90 tablet 3  . hyoscyamine (LEVSIN SL) 0.125 MG SL tablet Place 1 tablet (0.125 mg total) under the tongue 3 (three) times daily before meals. 90 tablet 2  . LUTEIN PO Take 2 tablets by mouth daily. Taking the [place]  2.    . metoprolol succinate (TOPROL-XL) 50 MG 24 hr tablet Take 1 tablet (50 mg total) by mouth daily. 90 tablet 3  . Multiple Vitamin (MULITIVITAMIN WITH MINERALS) TABS Take 1 tablet by mouth daily.    . potassium chloride SA (KLOR-CON M20) 20 MEQ tablet Take 0.5 tablets (10 mEq total) by mouth daily. For potassium replacement. Only takes when taking Lasix 90 tablet 3  . tamoxifen (NOLVADEX) 10 MG tablet Take 1 tablet (10 mg) daily. 30 tablet 6  . traMADol (ULTRAM) 50 MG tablet Take 50 mg by mouth daily as needed. Only takes if necessary    . valsartan (DIOVAN) 80 MG tablet Take 1 tablet (80 mg total) by mouth daily. 90 tablet 3  . zolpidem (AMBIEN) 5 MG tablet Take 0.5 tablets (2.5 mg total) by  mouth at bedtime as needed. Sleep 30 tablet 1  . acetaminophen (TYLENOL) 500 MG tablet Take 500 mg by mouth every 6 (six) hours as needed.    . furosemide (LASIX) 20 MG tablet Take 20 mg every other day 90 tablet 3  . warfarin (COUMADIN) 5 MG tablet Take 1 tablet (5 mg total) by mouth daily at 6 PM. 90 tablet 3    No results found for this or any previous visit (from the past 48 hour(s)). No results found.  ROS  Blood pressure 134/78, temperature 97.8 F (36.6 C), temperature source Oral, resp. rate 20, height 5' (1.524 m), weight 114 lb (51.71 kg), SpO2 98 %. Physical Exam  Constitutional:  Well-developed thin Caucasian female in NAD.  HENT:  Mouth/Throat: Oropharynx is clear and moist.  Eyes: Conjunctivae are normal. No scleral icterus.  Neck: No thyromegaly present.  Cardiovascular:  Irregular rhythm normal S1 and S2. No murmur or gallop noted.  Respiratory: Effort normal and breath sounds normal.  GI: Soft. She exhibits no distension and no mass. There is no tenderness.  Musculoskeletal: Edema: trace edema around right ankle.  Lymphadenopathy:    She has no cervical adenopathy.  Neurological: She is alert.  Skin: Skin is warm and dry.     Assessment/Plan History of solid food dysphagia. Abnormal barium study suggesting ulcer at distal esophagus. EGD and possible ED.  REHMAN,NAJEEB U 01/19/2015, 9:00 AM

## 2015-01-19 NOTE — Discharge Instructions (Signed)
Resume usual medications including warfarin starting this evening. Pantoprazole 40 mg by mouth 30 minutes before breakfast daily. Resume usual diet. No driving for 24 hours.  Physician will call with biopsy results. Upper abdominal ultrasound to be scheduled. INR in 7-10 days. Esophageal Dilatation The esophagus is the long, narrow tube which carries food and liquid from the mouth to the stomach. Esophageal dilatation is the technique used to stretch a blocked or narrowed portion of the esophagus. This procedure is used when a part of the esophagus has become so narrow that it becomes difficult, painful or even impossible to swallow. This is generally an uncomplicated form of treatment. When this is not successful, chest surgery may be required. This is a much more extensive form of treatment with a longer recovery time. CAUSES  Some of the more common causes of blockage or strictures of the esophagus are:  Narrowing from longstanding inflammation (soreness and redness) of the lower esophagus. This comes from the constant exposure of the lower esophagus to the acid which bubbles up from the stomach. Over time this causes scarring and narrowing of the lower esophagus.  Hiatal hernia in which a small part of the stomach bulges (herniates) up through the diaphragm. This can cause a gradual narrowing of the end of the esophagus.  Schatzki ring is a narrow ring of benign (non-cancerous) fibrous tissue which constricts the lower esophagus. The reason for this is not known.  Scleroderma is a connective tissue disorder that affects the esophagus and makes swallowing difficult.  Achalasia is an absence of nerves to the lower esophagus and to the esophageal sphincter. This is the circular muscle between the stomach and esophagus that relaxes to allow food into the stomach. After swallowing, it contracts to keep food in the stomach. This absence of nerves may be congenital (present since birth). This can  cause irregular spasms of the lower esophageal muscle. This spasm does not open up to allow food and fluid through. The result is a persistent blockage with subsequent slow trickling of the esophageal contents into the stomach.  Strictures may develop from swallowing materials which damage the esophagus. Some examples are strong acids or alkalis such as lye.  Growths such as benign (non-cancerous) and malignant (cancerous) tumors can block the esophagus.  Hereditary (present since birth) causes. DIAGNOSIS  Your caregiver often suspects this problem by taking a medical history. They will also do a physical exam. They can then prove their suspicions using X-rays and endoscopy. Endoscopy is an exam in which a tube like a small, flexible telescope is used to look at your esophagus.  TREATMENT There are different stretching (dilating) techniques that can be used. Simple bougie dilatation may be done in the office. This usually takes only a couple minutes. A numbing (anesthetic) spray of the throat is used. Endoscopy, when done, is done in an endoscopy suite under mild sedation. When fluoroscopy is used, the procedure is performed in X-ray. Other techniques require a little longer time. Recovery is usually quick. There is no waiting time to begin eating and drinking to test success of the treatment. Following are some of the methods used. Narrowing of the esophagus is treated by making it bigger. Commonly this is a mechanical problem which can be treated with stretching. This can be done in different ways. Your caregiver will discuss these with you. Some of the means used are:  A series of graduated (increasing thickness) flexible dilators can be used. These are weighted tubes passed through the  esophagus into the stomach. The tubes used become progressively larger until the desired stretched size is reached. Graduated dilators are a simple and quick way of opening the esophagus. No visualization is  required.  Another method is the use of endoscopy to place a flexible wire across the stricture. The endoscope is removed and the wire left in place. A dilator with a hole through it from end to end is guided down the esophagus and across the stricture. One or more of these dilators are passed over the wire. At the end of the exam, the wire is removed. This type of treatment may be performed in the X-ray department under fluoroscopy. An advantage of this procedure is the examiner is visualizing the end opening in the esophagus.  Stretching of the esophagus may be done using balloons. Deflated balloons are placed through the endoscope and across the stricture. This type of balloon dilatation is often done at the time of endoscopy or fluoroscopy. Flexible endoscopy allows the examiner to directly view the stricture. A balloon is inserted in the deflated form into the area of narrowing. It is then inflated with air to a certain pressure that is preset for a given circumference. When inflated, it becomes sausage shaped, stretched, and makes the stricture larger.  Achalasia requires a longer, larger balloon-type dilator. This is frequently done under X-ray control. In this situation, the spastic muscle fibers in the lower esophagus are stretched. All of the above procedures make the passage of food and water into the stomach easier. They also make it easier for stomach contents to reflux back into the esophagus. Special medications may be used following the procedure to help prevent further stricturing. Proton-pump inhibitor medications are good at decreasing the amount of acid in the stomach juice. When stomach juice refluxes into the esophagus, the juice is no longer as acidic and is less likely to burn or scar the esophagus. RISKS AND COMPLICATIONS Esophageal dilatation is usually performed effectively and without problems. Some complications that can occur are:  A small amount of bleeding almost always  happens where the stretching takes place. If this is too excessive it may require more aggressive treatment.  An uncommon complication is perforation (making a hole) of the esophagus. The esophagus is thin. It is easy to make a hole in it. If this happens, an operation may be necessary to repair this.  A small, undetected perforation could lead to an infection in the chest. This can be very serious. HOME CARE INSTRUCTIONS   If you received sedation for your procedure, do not drive, make important decisions, or perform any activities requiring your full coordination. Do not drink alcohol, take sedatives, or use any mind altering chemicals unless instructed by your caregiver.  You may use throat lozenges or warm salt water gargles if you have throat discomfort.  You can begin eating and drinking normally on return home unless instructed otherwise. Do not purposely try to force large chunks of food down to test the benefits of your procedure.  Mild discomfort can be eased with sips of ice water.  Medications for discomfort may or may not be needed. SEEK IMMEDIATE MEDICAL CARE IF:   You begin vomiting up blood.  You develop black, tarry stools.  You develop chills or an unexplained temperature of over 101F (38.3C)  You develop chest or abdominal pain.  You develop shortness of breath, or feel light-headed or faint.  Your swallowing is becoming more painful, difficult, or you are unable to swallow.  MAKE SURE YOU:   Understand these instructions.  Will watch your condition.  Will get help right away if you are not doing well or get worse. Document Released: 01/01/2006 Document Revised: 03/27/2014 Document Reviewed: 02/18/2006 [facility] Patient Information 2015 [place], [place]. This information is not intended to replace advice given to you by your health care provider. Make sure you discuss any questions you have with your health care provider.

## 2015-01-19 NOTE — Op Note (Signed)
EGD PROCEDURE REPORT  PATIENT:  Alicia Harrington  MR#:  [id number] Birthdate:  [birth year], 81 y.o., female Endoscopist:  Dr. Rogene Houston, MD  Procedure Date: 01/19/2015  Procedure:   EGD with ED  Indications:  Patient is 81 year old Caucasian female who presents with few years history of intermittent solid food dysphagia. She recalls dysphagia started after she completed treatment for breast CA. She denies frequent heartburn nausea vomiting abdominal pain or melena.            Informed Consent:  The risks, benefits, alternatives & imponderables which include, but are not limited to, bleeding, infection, perforation, drug reaction and potential missed lesion have been reviewed.  The potential for biopsy, lesion removal, esophageal dilation, etc. have also been discussed.  Questions have been answered.  All parties agreeable.  Please see history & physical in medical record for more information.  Medications:  Demerol 25 mg IV Versed 4 mg IV Cetacaine spray topically for oropharyngeal anesthesia  Description of procedure:  The endoscope was introduced through the mouth and advanced to the second portion of the duodenum without difficulty or limitations. The mucosal surfaces were surveyed very carefully during advancement of the scope and upon withdrawal.  Findings:  Esophagus:  Mucosa of the esophagus was carefully examined and no stricture, ulcer or masses noted. Single time of pink mucosa noted at distal esophagus about 4 x 12 mm. Focal edema and erythema noted at GE junction. GEJ:  40 cm Stomach:  Stomach was empty and distended very well with insufflation. Folds in the proximal stomach were normal. Examination mucosa gastric body revealed mosaic pattern with focal erythema. There were linear streaks of telangiectasia at antrum with watermelon appearance. Two prepyloric erosions noted. Pyloric channel was patent. Angularis fundus and cardia were examined by retroflexing the scope and no  other abnormality noted. Duodenum:  Normal bulbar and post bulbar mucosa.  Therapeutic/Diagnostic Maneuvers Performed:   Esophagus dilated by passing 85 French Maloney dilator to full insertion. As the dilator was withdrawn endoscope was passed again and no mucosal disruption noted to esophagus. Antral biopsy was taken for CLOtest. Biopsy was also taken from single patch of pink mucosa to rule out short segment Barrett's.  Complications:  None  Impression: No evidence of esophageal ulcer as suspected based on barium study. Single patch of pink mucosa about 4 x 12 mm. Biopsy taken to rule out short segment Barrett's. Mild changes of reflux esophagitis limited to GE junction. Mild portal gastropathy. 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 Prepyloric erosions. Esophagus dilated by passing 54 French Maloney dilator but no mucosal disruption noted. Biopsy taken from distal esophagus for routine histology and antral biopsy taken for CLOtest.  Recommendations:  Standard instructions given. Pantoprazole 40 mg by mouth every morning. Patient will resume warfarin at usual dose and get INR checked in 7-10 days. Upper abdominal ultrasound to be scheduled. I will be contacting patient with biopsy results.  Sheniya Garciaperez U  01/19/2015  9:43 AM  CC: Dr. [name], MD & Dr. Rayne Du ref. provider found

## 2015-01-20 LAB — CLOTEST (H. PYLORI), BIOPSY: HELICOBACTER SCREEN: NEGATIVE

## 2015-01-22 ENCOUNTER — Encounter (HOSPITAL_COMMUNITY): Payer: Self-pay | Admitting: Internal Medicine

## 2015-01-25 ENCOUNTER — Other Ambulatory Visit (INDEPENDENT_AMBULATORY_CARE_PROVIDER_SITE_OTHER): Payer: Self-pay | Admitting: Internal Medicine

## 2015-01-25 ENCOUNTER — Telehealth: Payer: Self-pay | Admitting: Cardiology

## 2015-01-25 DIAGNOSIS — K766 Portal hypertension: Principal | ICD-10-CM

## 2015-01-25 DIAGNOSIS — K3189 Other diseases of stomach and duodenum: Secondary | ICD-10-CM

## 2015-01-25 NOTE — Telephone Encounter (Signed)
Pt husband states that Dr. Corbin Ade wants pt to hold metoprolol 1 day prior to ABD [place] and to ok this with Dr. Harl Bowie. Explained to pt husband that normally there is no indication to hold metoprolol for [place]. Will forward to Dr. Harl Bowie

## 2015-01-25 NOTE — Telephone Encounter (Signed)
Is it okay for patient to stop Metoprolol one day prior to procedure scheduled for 01/31/15 @ [facility]? If so please contact patient. If not please contact provider. / tgs

## 2015-01-26 NOTE — Telephone Encounter (Signed)
Pt made aware, LM for Ann at Dr. Olevia Perches office

## 2015-01-26 NOTE — Telephone Encounter (Signed)
Its ok to hold metoprolol if Dr Laural Golden requires that for a test   Alicia Abts MD

## 2015-01-29 ENCOUNTER — Encounter (INDEPENDENT_AMBULATORY_CARE_PROVIDER_SITE_OTHER): Payer: Self-pay | Admitting: *Deleted

## 2015-01-29 ENCOUNTER — Ambulatory Visit (INDEPENDENT_AMBULATORY_CARE_PROVIDER_SITE_OTHER): Payer: Medicare Other | Admitting: *Deleted

## 2015-01-29 DIAGNOSIS — Z5181 Encounter for therapeutic drug level monitoring: Secondary | ICD-10-CM

## 2015-01-29 DIAGNOSIS — Z7901 Long term (current) use of anticoagulants: Secondary | ICD-10-CM

## 2015-01-29 DIAGNOSIS — I4891 Unspecified atrial fibrillation: Secondary | ICD-10-CM | POA: Diagnosis not present

## 2015-01-29 DIAGNOSIS — I480 Paroxysmal atrial fibrillation: Secondary | ICD-10-CM

## 2015-01-29 LAB — POCT INR: INR: 2.6

## 2015-01-30 DIAGNOSIS — H4011X0 Primary open-angle glaucoma, stage unspecified: Secondary | ICD-10-CM | POA: Diagnosis not present

## 2015-01-30 DIAGNOSIS — Z961 Presence of intraocular lens: Secondary | ICD-10-CM | POA: Diagnosis not present

## 2015-01-31 ENCOUNTER — Encounter: Payer: Self-pay | Admitting: Hematology & Oncology

## 2015-01-31 ENCOUNTER — Ambulatory Visit (HOSPITAL_COMMUNITY)
Admission: RE | Admit: 2015-01-31 | Discharge: 2015-01-31 | Disposition: A | Discharge status: Home / Self Care | Payer: Medicare Other | Source: Ambulatory Visit | Attending: Internal Medicine | Admitting: Internal Medicine

## 2015-01-31 ENCOUNTER — Other Ambulatory Visit (HOSPITAL_COMMUNITY): Payer: Self-pay | Admitting: Hematology and Oncology

## 2015-01-31 DIAGNOSIS — K766 Portal hypertension: Secondary | ICD-10-CM

## 2015-01-31 DIAGNOSIS — K3189 Other diseases of stomach and duodenum: Secondary | ICD-10-CM | POA: Diagnosis not present

## 2015-02-05 ENCOUNTER — Telehealth (INDEPENDENT_AMBULATORY_CARE_PROVIDER_SITE_OTHER): Payer: Self-pay | Admitting: *Deleted

## 2015-02-05 DIAGNOSIS — K3189 Other diseases of stomach and duodenum: Secondary | ICD-10-CM

## 2015-02-05 DIAGNOSIS — K766 Portal hypertension: Principal | ICD-10-CM

## 2015-02-05 NOTE — Telephone Encounter (Signed)
Per Dr.Rehman the patient will need to have labs drawn. 

## 2015-02-06 DIAGNOSIS — H25012 Cortical age-related cataract, left eye: Secondary | ICD-10-CM | POA: Diagnosis not present

## 2015-02-06 DIAGNOSIS — H2512 Age-related nuclear cataract, left eye: Secondary | ICD-10-CM | POA: Diagnosis not present

## 2015-02-06 DIAGNOSIS — H3531 Nonexudative age-related macular degeneration: Secondary | ICD-10-CM | POA: Diagnosis not present

## 2015-02-07 DIAGNOSIS — R928 Other abnormal and inconclusive findings on diagnostic imaging of breast: Secondary | ICD-10-CM | POA: Diagnosis not present

## 2015-02-07 DIAGNOSIS — Z853 Personal history of malignant neoplasm of breast: Secondary | ICD-10-CM | POA: Diagnosis not present

## 2015-02-08 ENCOUNTER — Encounter (INDEPENDENT_AMBULATORY_CARE_PROVIDER_SITE_OTHER): Payer: Self-pay | Admitting: *Deleted

## 2015-02-14 ENCOUNTER — Other Ambulatory Visit (HOSPITAL_COMMUNITY): Payer: Self-pay

## 2015-02-19 ENCOUNTER — Telehealth (INDEPENDENT_AMBULATORY_CARE_PROVIDER_SITE_OTHER): Payer: Self-pay | Admitting: *Deleted

## 2015-02-19 ENCOUNTER — Ambulatory Visit (INDEPENDENT_AMBULATORY_CARE_PROVIDER_SITE_OTHER): Payer: Medicare Other | Admitting: *Deleted

## 2015-02-19 DIAGNOSIS — Z5181 Encounter for therapeutic drug level monitoring: Secondary | ICD-10-CM

## 2015-02-19 DIAGNOSIS — I10 Essential (primary) hypertension: Secondary | ICD-10-CM | POA: Diagnosis not present

## 2015-02-19 DIAGNOSIS — M25521 Pain in right elbow: Secondary | ICD-10-CM | POA: Diagnosis not present

## 2015-02-19 DIAGNOSIS — I4891 Unspecified atrial fibrillation: Secondary | ICD-10-CM | POA: Diagnosis not present

## 2015-02-19 DIAGNOSIS — I480 Paroxysmal atrial fibrillation: Secondary | ICD-10-CM | POA: Diagnosis not present

## 2015-02-19 DIAGNOSIS — M7711 Lateral epicondylitis, right elbow: Secondary | ICD-10-CM | POA: Diagnosis not present

## 2015-02-19 DIAGNOSIS — Z7901 Long term (current) use of anticoagulants: Secondary | ICD-10-CM

## 2015-02-19 DIAGNOSIS — Z6823 Body mass index (BMI) 23.0-23.9, adult: Secondary | ICD-10-CM | POA: Diagnosis not present

## 2015-02-19 DIAGNOSIS — I509 Heart failure, unspecified: Secondary | ICD-10-CM | POA: Diagnosis not present

## 2015-02-19 DIAGNOSIS — Z8719 Personal history of other diseases of the digestive system: Secondary | ICD-10-CM | POA: Diagnosis not present

## 2015-02-19 LAB — POCT INR: INR: 2.2

## 2015-02-19 NOTE — Telephone Encounter (Signed)
Patient called and wanted to know how long she would need to be on Pantoprazole. Per Dr. Laural Golden she is to take 8-12 weeks then she will need to take on an as needed basis.  Patient was made aware by Charna Busman.

## 2015-02-20 ENCOUNTER — Other Ambulatory Visit (HOSPITAL_COMMUNITY): Payer: Self-pay

## 2015-02-20 DIAGNOSIS — C50919 Malignant neoplasm of unspecified site of unspecified female breast: Secondary | ICD-10-CM

## 2015-02-21 ENCOUNTER — Encounter (HOSPITAL_COMMUNITY): Payer: Medicare Other | Attending: Hematology & Oncology | Admitting: Hematology & Oncology

## 2015-02-21 ENCOUNTER — Encounter (HOSPITAL_COMMUNITY): Payer: Self-pay | Admitting: Hematology & Oncology

## 2015-02-21 ENCOUNTER — Encounter (HOSPITAL_BASED_OUTPATIENT_CLINIC_OR_DEPARTMENT_OTHER): Payer: Medicare Other

## 2015-02-21 VITALS — BP 138/76 | HR 72 | Temp 97.9°F | Resp 18 | Wt 115.4 lb

## 2015-02-21 DIAGNOSIS — C50919 Malignant neoplasm of unspecified site of unspecified female breast: Secondary | ICD-10-CM | POA: Diagnosis not present

## 2015-02-21 DIAGNOSIS — C50911 Malignant neoplasm of unspecified site of right female breast: Secondary | ICD-10-CM | POA: Diagnosis not present

## 2015-02-21 DIAGNOSIS — D7589 Other specified diseases of blood and blood-forming organs: Secondary | ICD-10-CM

## 2015-02-21 DIAGNOSIS — K3189 Other diseases of stomach and duodenum: Secondary | ICD-10-CM | POA: Diagnosis not present

## 2015-02-21 DIAGNOSIS — M8580 Other specified disorders of bone density and structure, unspecified site: Secondary | ICD-10-CM | POA: Diagnosis not present

## 2015-02-21 LAB — CBC WITH DIFFERENTIAL/PLATELET
Basophils Absolute: 0 10*3/uL (ref 0.0–0.1)
Basophils Relative: 1 % (ref 0–1)
Eosinophils Absolute: 0.1 10*3/uL (ref 0.0–0.7)
Eosinophils Relative: 1 % (ref 0–5)
HEMATOCRIT: 39.3 % (ref 36.0–46.0)
Hemoglobin: 13 g/dL (ref 12.0–15.0)
LYMPHS ABS: 0.9 10*3/uL (ref 0.7–4.0)
Lymphocytes Relative: 23 % (ref 12–46)
MCH: 34.9 pg — AB (ref 26.0–34.0)
MCHC: 33.1 g/dL (ref 30.0–36.0)
MCV: 105.6 fL — AB (ref 78.0–100.0)
MONO ABS: 0.5 10*3/uL (ref 0.1–1.0)
MONOS PCT: 12 % (ref 3–12)
NEUTROS ABS: 2.4 10*3/uL (ref 1.7–7.7)
Neutrophils Relative %: 63 % (ref 43–77)
Platelets: 125 10*3/uL — ABNORMAL LOW (ref 150–400)
RBC: 3.72 MIL/uL — ABNORMAL LOW (ref 3.87–5.11)
RDW: 13.9 % (ref 11.5–15.5)
WBC: 3.8 10*3/uL — AB (ref 4.0–10.5)

## 2015-02-21 LAB — COMPREHENSIVE METABOLIC PANEL
ALT: 29 U/L (ref 0–35)
ANION GAP: 7 (ref 5–15)
AST: 43 U/L — AB (ref 0–37)
Albumin: 4 g/dL (ref 3.5–5.2)
Alkaline Phosphatase: 100 U/L (ref 39–117)
BILIRUBIN TOTAL: 1.4 mg/dL — AB (ref 0.3–1.2)
BUN: 21 mg/dL (ref 6–23)
CALCIUM: 9.6 mg/dL (ref 8.4–10.5)
CO2: 27 mmol/L (ref 19–32)
CREATININE: 0.89 mg/dL (ref 0.50–1.10)
Chloride: 106 mmol/L (ref 96–112)
GFR calc Af Amer: 69 mL/min — ABNORMAL LOW (ref >90)
GFR calc non Af Amer: 60 mL/min — ABNORMAL LOW (ref >90)
Glucose, Bld: 88 mg/dL (ref 70–99)
Potassium: 4.1 mmol/L (ref 3.5–5.1)
SODIUM: 140 mmol/L (ref 135–145)
Total Protein: 6.3 g/dL (ref 6.0–8.3)

## 2015-02-21 NOTE — Progress Notes (Deleted)
Alicia Harrington, [place] [address]    DIAGNOSIS:  Stage II R breast cancer   Treated under NSABP 41 protocol. Could not tolerate Taxol plus     trastuzumab secondary to heart failure   June 2010 to August 2014 Tamoxifen   August 2014 to 12/30/2013 Arimidex   Tamoxifen 10 daily on 12/30/2013 by Dr. Stephenie Harrington   DEXA on 09/2013 with osteopenia   CURRENT THERAPY:Tamoxifen 10 mg daily per Dr. Stephenie Harrington  INTERVAL HISTORY: Alicia Harrington 81 y.o. female returns for follow-up of a stage II breast cancer of the R breast.  She complains of an episode of sharp L breast pain. It woke her up during the night. She is concerned as this is how her prior breast cancer presented.  She is concerned about weight loss. She states she has chronic abdominal pain that is exacerbated with eating. She has seen Dr. Laural Harrington in the past and would like to see him again. She does not recall ever having an EGD.   MEDICAL HISTORY: Past Medical History  Diagnosis Date  . Cardiomyopathy 2009    possibly secondary to Adriamycin; EF 40% in 12/09 and 50% 3/10; pulmonary edema in 2009  . Chronic kidney disease (CKD), stage I     when on diuretic with creatinine of 1.6  . Hypertension   . Adenocarcinoma, breast     treated with lumpectomy, node dissection, chemo and RT  . Hearing impairment   . DJD (degenerative joint disease)     right knee and lumbosacral spine  . History of angina 2001    normal cornary arteries in 2001  . Macular degeneration, dry     + cataracts  . Chronic anticoagulation 04/16/2011  . Complication of anesthesia     patient vomits with demerol  . PONV (postoperative nausea and vomiting)   . Sick sinus syndrome     Paroxysmal to permanent AF; initially first degree AV block also noted  . Neuropathy     fingers and toes  . Ataxia   . Retinal macular atrophy     has HEARING IMPAIRMENT; HYPERTENSION; Atrial fibrillation; Chronic anticoagulation; Cardiomyopathy;  Sick sinus syndrome; Chronic kidney disease (CKD), stage I; Adenocarcinoma, breast; DJD (degenerative joint disease); History of angina; Macular degeneration, dry; Fasting hyperglycemia; Verruca vulgaris; Encounter for therapeutic drug monitoring; Mitral regurgitation; and Abdominal pain on her problem list.     is allergic to altace.  [name] had no medications administered during this visit.  SURGICAL HISTORY: Past Surgical History  Procedure Laterality Date  . Appendectomy    . Dilation and curettage of uterus    . Middle ear surgery      Left  . Tonsillectomy    . Microdiscectomy lumbar  2009    lumbosacral spine  . Mastectomy partial / lumpectomy w/ axillary lymphadenectomy  2009    Carcinoma of the breast  . Colonoscopy  02/11/2012    Colonoscopy plus polypectomy x2, Alicia Harrington  . Port-a-cath removal    . Cataract extraction w/phaco  10/11/2012    Procedure: CATARACT EXTRACTION PHACO AND INTRAOCULAR LENS PLACEMENT (IOC);  Surgeon: Alicia Che, MD;  Location: [facility];  Service: Ophthalmology;  Laterality: Right;  CDE:10.82  . Esophagogastroduodenoscopy N/A 01/19/2015    Procedure: ESOPHAGOGASTRODUODENOSCOPY (EGD);  Surgeon: Alicia Houston, MD;  Location: [facility];  Service: Endoscopy;  Laterality: N/A;  910  . Esophageal dilation N/A 01/19/2015    Procedure: ESOPHAGEAL DILATION;  Surgeon:  Alicia Houston, MD;  Location: [facility];  Service: Endoscopy;  Laterality: N/A;    SOCIAL HISTORY: History   Social History  . Marital Status: Married    Spouse Name: N/A  . Number of Children: N/A  . Years of Education: N/A   Occupational History  .      retired   Social History Main Topics  . Smoking status: Former Smoker    Start date: 11/24/1954    Quit date: 12/23/1955  . Smokeless tobacco: Never Used     Comment: quit at age 69  . Alcohol Use: 0.0 oz/week    0 Standard drinks or equivalent per week     Comment: one glass of wine per day  . Drug Use: No  .  Sexual Activity: Not on file   Other Topics Concern  . Not on file   Social History Narrative   Pt lives in [place] with spouse.  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    FAMILY HISTORY: Family History  Problem Relation Age of Onset  . Heart failure Mother   . Sudden death Father 64    ROS  PHYSICAL EXAMINATION  ECOG PERFORMANCE STATUS: 0 - Asymptomatic  Filed Vitals:   02/21/15 1200  BP: 138/76  Pulse: 72  Temp: 97.9 F (36.6 C)  Resp: 18    Physical Exam  Constitutional: She is oriented to person, place, and time and well-developed, well-nourished, and in no distress.  HENT:  Head: Normocephalic and atraumatic.  Nose: Nose normal.  Mouth/Throat: Oropharynx is clear and moist. No oropharyngeal exudate.  Eyes: Conjunctivae and EOM are normal. Pupils are equal, round, and reactive to light. Right eye exhibits no discharge. Left eye exhibits no discharge. No scleral icterus.  Neck: Normal range of motion. Neck supple. No tracheal deviation present. No thyromegaly present.  Cardiovascular: Normal rate, regular rhythm and normal heart sounds.  Exam reveals no gallop and no friction rub.   No murmur heard. Pulmonary/Chest: Effort normal and breath sounds normal. She has no wheezes. She has no rales.  Bilateral breast exam performed and unremarkable for palpable abnormalities, no nipple changes and no skin retraction or dimpling. Bilateral axillae are clear  Abdominal: Soft. Bowel sounds are normal. She exhibits no distension and no mass. There is no tenderness. There is no rebound and no guarding.  Musculoskeletal: Normal range of motion. She exhibits no edema.  Lymphadenopathy:    She has no cervical adenopathy.  Neurological: She is alert and oriented to person, place, and time. She has normal reflexes. No cranial nerve deficit. Gait normal. Coordination normal.  Skin: Skin is warm and dry. No rash noted.  Psychiatric: Mood, memory, affect and judgment  normal.  Nursing note and vitals reviewed.   LABORATORY DATA:  CBC    Component Value Date/Time   WBC 3.8* 02/21/2015 1208   RBC 3.72* 02/21/2015 1208   HGB 13.0 02/21/2015 1208   HCT 39.3 02/21/2015 1208   PLT 125* 02/21/2015 1208   MCV 105.6* 02/21/2015 1208   MCH 34.9* 02/21/2015 1208   MCHC 33.1 02/21/2015 1208   RDW 13.9 02/21/2015 1208   LYMPHSABS 0.9 02/21/2015 1208   MONOABS 0.5 02/21/2015 1208   EOSABS 0.1 02/21/2015 1208   BASOSABS 0.0 02/21/2015 1208   CMP     Component Value Date/Time   NA 142 12/29/2014 0947   K 3.7 12/29/2014 0947   CL 107 12/29/2014 0947   CO2 28 12/29/2014 0947  GLUCOSE 120* 12/29/2014 0947   BUN 20 12/29/2014 0947   CREATININE 0.83 12/29/2014 0947   CREATININE 0.98 04/07/2013 1154   CALCIUM 8.9 12/29/2014 0947   PROT 6.6 12/29/2014 0947   ALBUMIN 4.1 12/29/2014 0947   AST 37 12/29/2014 0947   ALT 21 12/29/2014 0947   ALKPHOS 102 12/29/2014 0947   BILITOT 1.2 12/29/2014 0947   GFRNONAA 65* 12/29/2014 0947   GFRAA 75* 12/29/2014 0947       ASSESSMENT and THERAPY PLAN:    No problem-specific assessment & plan notes found for this encounter.   All questions were answered. The patient knows to call the clinic with any problems, questions or concerns. We can certainly see the patient much sooner if necessary.  Molli Hazard 02/21/2015

## 2015-02-21 NOTE — Progress Notes (Signed)
LABS DRAWN

## 2015-02-21 NOTE — Patient Instructions (Signed)
[place] at [facility] Discharge Instructions  RECOMMENDATIONS MADE BY THE CONSULTANT AND ANY TEST RESULTS WILL BE SENT TO YOUR REFERRING PHYSICIAN.  Exam and discussion by Dr. Whitney Muse If any concerns with your lab work we will contact you.  Report any new lumps, bone pain, shortness of breath or other symptoms. Lab work and office visit in 6 months.  Thank you for choosing [place] at [facility] to provide your oncology and hematology care.  To afford each patient quality time with our provider, please arrive at least 15 minutes before your scheduled appointment time.    You need to re-schedule your appointment should you arrive 10 or more minutes late.  We strive to give you quality time with our providers, and arriving late affects you and other patients whose appointments are after yours.  Also, if you no show three or more times for appointments you may be dismissed from the clinic at the providers discretion.     Again, thank you for choosing [facility].  Our hope is that these requests will decrease the amount of time that you wait before being seen by our physicians.       _____________________________________________________________  Should you have questions after your visit to [facility], please contact our office at [phone] between the hours of 8:30 a.m. and 4:30 p.m.  Voicemails left after 4:30 p.m. will not be returned until the following business day.  For prescription refill requests, have your pharmacy contact our office.

## 2015-02-22 LAB — HEPATITIS B SURFACE ANTIGEN: HEP B S AG: NEGATIVE

## 2015-02-22 LAB — HEPATITIS C ANTIBODY: HCV AB: NEGATIVE

## 2015-02-23 ENCOUNTER — Other Ambulatory Visit (HOSPITAL_COMMUNITY): Payer: Medicare Other

## 2015-02-23 ENCOUNTER — Ambulatory Visit (HOSPITAL_COMMUNITY): Payer: Medicare Other | Admitting: Oncology

## 2015-03-07 ENCOUNTER — Other Ambulatory Visit (HOSPITAL_COMMUNITY): Payer: Self-pay

## 2015-03-07 DIAGNOSIS — C50919 Malignant neoplasm of unspecified site of unspecified female breast: Secondary | ICD-10-CM

## 2015-03-12 DIAGNOSIS — Z7901 Long term (current) use of anticoagulants: Secondary | ICD-10-CM | POA: Diagnosis not present

## 2015-03-12 DIAGNOSIS — I4891 Unspecified atrial fibrillation: Secondary | ICD-10-CM | POA: Diagnosis not present

## 2015-03-12 LAB — PROTIME-INR: INR: 1.9 — AB (ref 0.9–1.1)

## 2015-03-13 ENCOUNTER — Ambulatory Visit (INDEPENDENT_AMBULATORY_CARE_PROVIDER_SITE_OTHER): Payer: Medicare Other | Admitting: *Deleted

## 2015-03-13 DIAGNOSIS — Z5181 Encounter for therapeutic drug level monitoring: Secondary | ICD-10-CM

## 2015-03-13 DIAGNOSIS — I480 Paroxysmal atrial fibrillation: Secondary | ICD-10-CM

## 2015-03-20 ENCOUNTER — Ambulatory Visit (INDEPENDENT_AMBULATORY_CARE_PROVIDER_SITE_OTHER): Payer: Medicare Other | Admitting: Internal Medicine

## 2015-03-22 NOTE — Progress Notes (Signed)
Alicia Harrington, [address] [place] [address]    DIAGNOSIS:  Stage II R breast cancer   Treated under NSABP 41 protocol. Could not tolerate Taxol plus trastuzumab secondary   to heart failure   June 2010 to August 2014 Tamoxifen   August 2014 to 12/30/2013 Arimidex   Tamoxifen 10 daily on 12/30/2013 by Dr. Stephenie Acres    DEXA on 09/2013 with osteopenia   Breast Cancer INDEX with score of 7.2, 9.4% risk of distant recurrance with a low   likelihood of benefit from extended endocrine therapy   CURRENT THERAPY:Tamoxifen 10 mg daily per Dr. Stephenie Acres  INTERVAL HISTORY: Alicia Harrington 81 y.o. female returns for follow-up of a stage II breast cancer of the R breast.  She is undergone recent mammography at her OB/GYN's office in [place]. She is here today to review her breast cancer index. She is otherwise doing well with no major complaints. He had some difficulty swallowing at her last visit and has undergone "stretching of her esophagus." She is able to swallow much better. Her appetite remains unchanged.  MEDICAL HISTORY: Past Medical History  Diagnosis Date  . Cardiomyopathy 2009    possibly secondary to Adriamycin; EF 40% in 12/09 and 50% 3/10; pulmonary edema in 2009  . Chronic kidney disease (CKD), stage I     when on diuretic with creatinine of 1.6  . Hypertension   . Adenocarcinoma, breast     treated with lumpectomy, node dissection, chemo and RT  . Hearing impairment   . DJD (degenerative joint disease)     right knee and lumbosacral spine  . History of angina 2001    normal cornary arteries in 2001  . Macular degeneration, dry     + cataracts  . Chronic anticoagulation 04/16/2011  . Complication of anesthesia     patient vomits with demerol  . PONV (postoperative nausea and vomiting)   . Sick sinus syndrome     Paroxysmal to permanent AF; initially first degree AV block also noted  . Neuropathy     fingers and toes  . Ataxia   . Retinal macular  atrophy     has HEARING IMPAIRMENT; HYPERTENSION; Atrial fibrillation; Chronic anticoagulation; Cardiomyopathy; Sick sinus syndrome; Chronic kidney disease (CKD), stage I; Adenocarcinoma, breast; DJD (degenerative joint disease); History of angina; Macular degeneration, dry; Fasting hyperglycemia; Verruca vulgaris; Encounter for therapeutic drug monitoring; Mitral regurgitation; and Abdominal pain on her problem list.     is allergic to altace.  Ms. Winecoff does not currently have medications on file.  SURGICAL HISTORY: Past Surgical History  Procedure Laterality Date  . Appendectomy    . Dilation and curettage of uterus    . Middle ear surgery      Left  . Tonsillectomy    . Microdiscectomy lumbar  2009    lumbosacral spine  . Mastectomy partial / lumpectomy w/ axillary lymphadenectomy  2009    Carcinoma of the breast  . Colonoscopy  02/11/2012    Colonoscopy plus polypectomy x2, Rehman  . Port-a-cath removal    . Cataract extraction w/phaco  10/11/2012    Procedure: CATARACT EXTRACTION PHACO AND INTRAOCULAR LENS PLACEMENT (IOC);  Surgeon: Williams Che, MD;  Location: [facility];  Service: Ophthalmology;  Laterality: Right;  CDE:10.82  . Esophagogastroduodenoscopy N/A 01/19/2015    Procedure: ESOPHAGOGASTRODUODENOSCOPY (EGD);  Surgeon: Rogene Houston, MD;  Location: [facility];  Service: Endoscopy;  Laterality: N/A;  910  . Esophageal dilation  N/A 01/19/2015    Procedure: ESOPHAGEAL DILATION;  Surgeon: Rogene Houston, MD;  Location: [facility];  Service: Endoscopy;  Laterality: N/A;    SOCIAL HISTORY: History   Social History  . Marital Status: Married    Spouse Name: N/A  . Number of Children: N/A  . Years of Education: N/A   Occupational History  .      retired   Social History Main Topics  . Smoking status: Former Smoker    Start date: 11/24/1954    Quit date: 12/23/1955  . Smokeless tobacco: Never Used     Comment: quit at age 25  . Alcohol Use: 0.0  oz/week    0 Standard drinks or equivalent per week     Comment: one glass of wine per day  . Drug Use: No  . Sexual Activity: Not on file   Other Topics Concern  . Not on file   Social History Narrative   Pt lives in [place] with spouse.  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    FAMILY HISTORY: Family History  Problem Relation Age of Onset  . Heart failure Mother   . Sudden death Father 33    Review of Systems  Constitutional: Positive for malaise/fatigue.  HENT: Negative.   Eyes: Negative.   Respiratory: Negative.   Cardiovascular: Negative.   Gastrointestinal: Negative.   Genitourinary: Negative.   Musculoskeletal: Positive for joint pain.  Skin: Negative.   Neurological: Negative.   Endo/Heme/Allergies: Negative.   Psychiatric/Behavioral: Negative.     PHYSICAL EXAMINATION  ECOG PERFORMANCE STATUS: 0 - Asymptomatic  Filed Vitals:   02/21/15 1200  BP: 138/76  Pulse: 72  Temp: 97.9 F (36.6 C)  Resp: 18    Physical Exam  Constitutional: She is oriented to person, place, and time and well-developed, well-nourished, and in no distress.  HENT:  Head: Normocephalic and atraumatic.  Nose: Nose normal.  Mouth/Throat: Oropharynx is clear and moist. No oropharyngeal exudate.  Eyes: Conjunctivae and EOM are normal. Pupils are equal, round, and reactive to light. Right eye exhibits no discharge. Left eye exhibits no discharge. No scleral icterus.  Neck: Normal range of motion. Neck supple. No tracheal deviation present. No thyromegaly present.  Cardiovascular: Normal rate, regular rhythm and normal heart sounds.  Exam reveals no gallop and no friction rub.   No murmur heard. Pulmonary/Chest: Effort normal and breath sounds normal. She has no wheezes. She has no rales.  Bilateral breast exam performed and unremarkable for palpable abnormalities, no nipple changes and no skin retraction or dimpling. Bilateral axillae are clear  Abdominal: Soft. Bowel  sounds are normal. She exhibits no distension and no mass. There is no tenderness. There is no rebound and no guarding.  Musculoskeletal: Normal range of motion. She exhibits no edema.  Lymphadenopathy:    She has no cervical adenopathy.  Neurological: She is alert and oriented to person, place, and time. She has normal reflexes. No cranial nerve deficit. Gait normal. Coordination normal.  Skin: Skin is warm and dry. No rash noted.  Psychiatric: Mood, memory, affect and judgment normal.  Nursing note and vitals reviewed.   LABORATORY DATA:  CBC    Component Value Date/Time   WBC 3.8* 02/21/2015 1208   RBC 3.72* 02/21/2015 1208   HGB 13.0 02/21/2015 1208   HCT 39.3 02/21/2015 1208   PLT 125* 02/21/2015 1208   MCV 105.6* 02/21/2015 1208   MCH 34.9* 02/21/2015 1208   MCHC 33.1 02/21/2015 1208  RDW 13.9 02/21/2015 1208   LYMPHSABS 0.9 02/21/2015 1208   MONOABS 0.5 02/21/2015 1208   EOSABS 0.1 02/21/2015 1208   BASOSABS 0.0 02/21/2015 1208   CMP     Component Value Date/Time   NA 140 02/21/2015 1208   K 4.1 02/21/2015 1208   CL 106 02/21/2015 1208   CO2 27 02/21/2015 1208   GLUCOSE 88 02/21/2015 1208   BUN 21 02/21/2015 1208   CREATININE 0.89 02/21/2015 1208   CREATININE 0.98 04/07/2013 1154   CALCIUM 9.6 02/21/2015 1208   PROT 6.3 02/21/2015 1208   ALBUMIN 4.0 02/21/2015 1208   AST 43* 02/21/2015 1208   ALT 29 02/21/2015 1208   ALKPHOS 100 02/21/2015 1208   BILITOT 1.4* 02/21/2015 1208   GFRNONAA 60* 02/21/2015 1208   GFRAA 69* 02/21/2015 1208     ASSESSMENT and THERAPY PLAN:  Stage II R breast cancer Treated under NSABP 41 protocol. Could not tolerate Taxol plus trastuzumab secondary to heart failure Osteopenia BCI predictive with low likelihood of benefit from extended adjuvant therapy Macrocytosis Thrombocytopenia   We reviewed her breast cancer index in detail. I advised her of her estimated risk of late recurrence and also her estimated benefit from  ongoing endocrine therapy. She is only on 10 mg of tamoxifen a day. I again advised her that it may be active but no good studies have looked at 10 mg tamoxifen dosing. Since she feels she has some intolerable side effects from treatment based upon her BCI I have recommended discontinuing additional tamoxifen. I will see her back again in 6 months with exam. If she is doing well at that time we will move her out to yearly visits. She has completed all recommended therapy for her prior right breast cancer.  She is to continue with all screening as recommended. She will continue with yearly mammography  She has a macrocytosis which I suspect may be posttreatment related but we will continue with ongoing follow-up. Platelet count is mildly low today and we will recheck labs again prior to her next 6 month visit.  All questions were answered. The patient knows to call the clinic with any problems, questions or concerns. We can certainly see the patient much sooner if necessary. This note was signed electronically Molli Hazard, MD  This document serves as a record of services personally performed by Ancil Linsey, MD. It was created on her behalf by Pearlie Oyster, a trained medical scribe. The creation of this record is based on the scribe's personal observations and the provider's statements to them. This document has been checked and approved by the attending provider.

## 2015-04-03 ENCOUNTER — Encounter: Payer: Self-pay | Admitting: Cardiology

## 2015-04-03 ENCOUNTER — Ambulatory Visit (INDEPENDENT_AMBULATORY_CARE_PROVIDER_SITE_OTHER): Payer: Medicare Other | Admitting: Cardiology

## 2015-04-03 VITALS — BP 114/58 | HR 56 | Ht 59.0 in | Wt 115.6 lb

## 2015-04-03 DIAGNOSIS — Z7901 Long term (current) use of anticoagulants: Secondary | ICD-10-CM

## 2015-04-03 DIAGNOSIS — I340 Nonrheumatic mitral (valve) insufficiency: Secondary | ICD-10-CM | POA: Diagnosis not present

## 2015-04-03 DIAGNOSIS — I482 Chronic atrial fibrillation, unspecified: Secondary | ICD-10-CM

## 2015-04-03 DIAGNOSIS — I429 Cardiomyopathy, unspecified: Secondary | ICD-10-CM

## 2015-04-03 DIAGNOSIS — I10 Essential (primary) hypertension: Secondary | ICD-10-CM

## 2015-04-03 NOTE — Progress Notes (Signed)
Clinical Summary Alicia Harrington is a 81 y.o.female seen today for follow up of the following medical problems.   1. Cardiomyopathy  - possibly chemo induced, secondary to Adriamycin.  - LVEF 40% 12/09, increased to 50% in 01/2009. Recent echo 10/2014 LVEF 40-45%.  - last visit increased Toprol to 50mg  daily. Since change she reports her energy level and exercise tolerance has increased - Notes some swelling at times, takes her lasix on average every other day.    2. HTN  - does not check regularly at home  - compliant with meds   3. Paroxysmal afib  - rate control with Toprol, denies any recent palpitations.  - she is on anticoagulation, followed here in coumadin clinic. Denies any bleeding issues   4. Breast CA - followed by oncology  Past Medical History  Diagnosis Date  . Cardiomyopathy 2009    possibly secondary to Adriamycin; EF 40% in 12/09 and 50% 3/10; pulmonary edema in 2009  . Chronic kidney disease (CKD), stage I     when on diuretic with creatinine of 1.6  . Hypertension   . Adenocarcinoma, breast     treated with lumpectomy, node dissection, chemo and RT  . Hearing impairment   . DJD (degenerative joint disease)     right knee and lumbosacral spine  . History of angina 2001    normal cornary arteries in 2001  . Macular degeneration, dry     + cataracts  . Chronic anticoagulation 04/16/2011  . Complication of anesthesia     patient vomits with demerol  . PONV (postoperative nausea and vomiting)   . Sick sinus syndrome     Paroxysmal to permanent AF; initially first degree AV block also noted  . Neuropathy     fingers and toes  . Ataxia   . Retinal macular atrophy      Allergies  Allergen Reactions  . Altace [Ramipril] Rash     Current Outpatient Prescriptions  Medication Sig Dispense Refill  . acetaminophen (TYLENOL) 500 MG tablet Take 500 mg by mouth every 6 (six) hours as needed.    Marland Kitchen b complex vitamins tablet Take 1 tablet by mouth  daily.    . bifidobacterium infantis (ALIGN) capsule Take 1 capsule by mouth daily. 14 capsule 0  . cholecalciferol (VITAMIN D) 1000 UNITS tablet Take 1,000 Units by mouth daily.      . Cyanocobalamin (VITAMIN B 12 PO) Take 1,000 mcg by mouth daily.    . diclofenac sodium (VOLTAREN) 1 % GEL Apply 1 application topically 4 (four) times daily as needed.    . digoxin (LANOXIN) 0.125 MG tablet Take 1 tablet (0.125 mg total) by mouth daily. 90 tablet 3  . furosemide (LASIX) 20 MG tablet Take 20 mg every other day 90 tablet 3  . hyoscyamine (LEVSIN SL) 0.125 MG SL tablet Place 1 tablet (0.125 mg total) under the tongue 3 (three) times daily before meals. 90 tablet 2  . latanoprost (XALATAN) 0.005 % ophthalmic solution Place 1 drop into both eyes daily.  3  . metoprolol succinate (TOPROL-XL) 50 MG 24 hr tablet Take 1 tablet (50 mg total) by mouth daily. 90 tablet 3  . Multiple Vitamin (MULITIVITAMIN WITH MINERALS) TABS Take 1 tablet by mouth daily.    . Multiple Vitamins-Minerals (PRESERVISION AREDS 2 PO) Take 1 capsule by mouth 2 (two) times daily.    . pantoprazole (PROTONIX) 40 MG tablet Take 1 tablet (40 mg total) by mouth daily before  breakfast. 30 tablet 5  . potassium chloride SA (KLOR-CON M20) 20 MEQ tablet Take 0.5 tablets (10 mEq total) by mouth daily. For potassium replacement. Only takes when taking Lasix 90 tablet 3  . tamoxifen (NOLVADEX) 10 MG tablet Take 1 tablet (10 mg) daily. 30 tablet 6  . traMADol (ULTRAM) 50 MG tablet Take 50 mg by mouth daily as needed. Only takes if necessary    . valsartan (DIOVAN) 80 MG tablet Take 1 tablet (80 mg total) by mouth daily. 90 tablet 3  . warfarin (COUMADIN) 5 MG tablet Take 1 tablet (5 mg total) by mouth daily at 6 PM. 90 tablet 3  . zolpidem (AMBIEN) 5 MG tablet Take 0.5 tablets (2.5 mg total) by mouth at bedtime as needed. Sleep 30 tablet 1   No current facility-administered medications for this visit.     Past Surgical History  Procedure  Laterality Date  . Appendectomy    . Dilation and curettage of uterus    . Middle ear surgery      Left  . Tonsillectomy    . Microdiscectomy lumbar  2009    lumbosacral spine  . Mastectomy partial / lumpectomy w/ axillary lymphadenectomy  2009    Carcinoma of the breast  . Colonoscopy  02/11/2012    Colonoscopy plus polypectomy x2, Rehman  . Port-a-cath removal    . Cataract extraction w/phaco  10/11/2012    Procedure: CATARACT EXTRACTION PHACO AND INTRAOCULAR LENS PLACEMENT (IOC);  Surgeon: Williams Che, MD;  Location: [facility];  Service: Ophthalmology;  Laterality: Right;  CDE:10.82  . Esophagogastroduodenoscopy N/A 01/19/2015    Procedure: ESOPHAGOGASTRODUODENOSCOPY (EGD);  Surgeon: Rogene Houston, MD;  Location: [facility];  Service: Endoscopy;  Laterality: N/A;  910  . Esophageal dilation N/A 01/19/2015    Procedure: ESOPHAGEAL DILATION;  Surgeon: Rogene Houston, MD;  Location: [facility];  Service: Endoscopy;  Laterality: N/A;     Allergies  Allergen Reactions  . Altace [Ramipril] Rash      Family History  Problem Relation Age of Onset  . Heart failure Mother   . Sudden death Father 26     Social History Ms. Mcguinness reports that she quit smoking about 59 years ago. She started smoking about 60 years ago. She has never used smokeless tobacco. Ms. Nelson reports that she drinks alcohol.   Review of Systems CONSTITUTIONAL: No weight loss, fever, chills, weakness or fatigue.  HEENT: Eyes: No visual loss, blurred vision, double vision or yellow sclerae.No hearing loss, sneezing, congestion, runny nose or sore throat.  SKIN: No rash or itching.  CARDIOVASCULAR: per HPI RESPIRATORY: No shortness of breath, cough or sputum.  GASTROINTESTINAL: No anorexia, nausea, vomiting or diarrhea. No abdominal pain or blood.  GENITOURINARY: No burning on urination, no polyuria NEUROLOGICAL: No headache, dizziness, syncope, paralysis, ataxia, numbness or tingling in the  extremities. No change in bowel or bladder control.  MUSCULOSKELETAL: No muscle, back pain, joint pain or stiffness.  LYMPHATICS: No enlarged nodes. No history of splenectomy.  PSYCHIATRIC: No history of depression or anxiety.  ENDOCRINOLOGIC: No reports of sweating, cold or heat intolerance. No polyuria or polydipsia.  Marland Kitchen   Physical Examination Filed Vitals:   04/03/15 0904  BP: 114/58  Pulse: 56   Filed Vitals:   04/03/15 0904  Height: 4\' 11"  (1.499 m)  Weight: 115 lb 9.6 oz (52.436 kg)    Gen: resting comfortably, no acute distress HEENT: no scleral icterus, pupils equal round and reactive,  no palptable cervical adenopathy,  CV: irreg, 2/6 systolic murmur at apex, no JVD Resp: Clear to auscultation bilaterally GI: abdomen is soft, non-tender, non-distended, normal bowel sounds, no hepatosplenomegaly MSK: extremities are warm, no edema.  Skin: warm, no rash Neuro:  no focal deficits Psych: appropriate affect   Diagnostic Studies 12/2013 Echo LVEF 45-50%, elevated LA pressure, mild AI, moderate MR, mod TR, PASP 46  10/2014 Echo Study Conclusions  - Left ventricle: The cavity size was normal. Wall thickness was normal. Systolic function was mildly to moderately reduced. The estimated ejection fraction was in the range of 40% to 45%. - Aortic valve: There was mild regurgitation. Regurgitation pressure half-time: 794 ms. - Mitral valve: Mildly calcified annulus. There was mild to moderate regurgitation. MR vena contracta is 0.4 cm. - Left atrium: The atrium was severely dilated. - Right ventricle: The interventricular septum is flattened in systole and diastole, consistent with RV pressure and volume overload. The cavity size was mildly dilated. Systolic function was low normal. RV TAPSE is 1.6 cm. - Right atrium: The atrium was severely dilated. - Atrial septum: No defect or patent foramen ovale was identified. - Pulmonary arteries: Systolic pressure was  moderately increased. PA peak pressure: 60 mm Hg (S). - Technically adequate study.    Assessment and Plan   1. Chronic systolic HF - previous history of LV systolic dysfunction thought to be related to Adriamycin - last echo 10/2014 LVEF 40-45% - appears euvolemic today. Has had orthostatic symptoms with daily lasix before, continue prn - will not further titrate meds due to low baseline HR and prior orthostatic symptoms  2. HTN  - at goal, continue current meds   3. Paroxysmal afib  - no current symptoms, continue current meds. She is not interested in NOACs at this time, continue coumadin.   4. Mitral regurgitation - stable by recent echo 10/2014, mild to moderate - continue to follow     Arnoldo Lenis, M.D.

## 2015-04-03 NOTE — Patient Instructions (Signed)
Your physician wants you to follow-up in: 6 months with Dr.Branch - November 2016 You will receive a reminder letter in the mail two months in advance. If you don't receive a letter, please call our office to schedule the follow-up appointment.   Your physician recommends that you continue on your current medications as directed. Please refer to the Current Medication list given to you today.    Thank you for choosing [facility] !

## 2015-04-04 ENCOUNTER — Ambulatory Visit (INDEPENDENT_AMBULATORY_CARE_PROVIDER_SITE_OTHER): Payer: Medicare Other | Admitting: *Deleted

## 2015-04-04 DIAGNOSIS — Z5181 Encounter for therapeutic drug level monitoring: Secondary | ICD-10-CM | POA: Diagnosis not present

## 2015-04-04 DIAGNOSIS — I4891 Unspecified atrial fibrillation: Secondary | ICD-10-CM

## 2015-04-04 DIAGNOSIS — Z7901 Long term (current) use of anticoagulants: Secondary | ICD-10-CM

## 2015-04-04 LAB — POCT INR: INR: 1.8

## 2015-04-09 ENCOUNTER — Telehealth: Payer: Self-pay | Admitting: *Deleted

## 2015-04-09 DIAGNOSIS — I480 Paroxysmal atrial fibrillation: Secondary | ICD-10-CM

## 2015-04-09 DIAGNOSIS — Z5181 Encounter for therapeutic drug level monitoring: Secondary | ICD-10-CM

## 2015-04-09 NOTE — Telephone Encounter (Signed)
Please call patient regarding her appointment / tg

## 2015-04-09 NOTE — Telephone Encounter (Signed)
Pt leaving to go out of state earlier than planned.  Will add PT/INR to labs being drawn at the [facility] tomorrow and call pt with results.

## 2015-04-10 ENCOUNTER — Telehealth: Payer: Self-pay | Admitting: *Deleted

## 2015-04-10 ENCOUNTER — Encounter (HOSPITAL_COMMUNITY): Payer: Medicare Other | Attending: Hematology & Oncology

## 2015-04-10 ENCOUNTER — Ambulatory Visit (INDEPENDENT_AMBULATORY_CARE_PROVIDER_SITE_OTHER): Payer: Medicare Other | Admitting: *Deleted

## 2015-04-10 DIAGNOSIS — I4891 Unspecified atrial fibrillation: Secondary | ICD-10-CM

## 2015-04-10 DIAGNOSIS — Z5181 Encounter for therapeutic drug level monitoring: Secondary | ICD-10-CM

## 2015-04-10 DIAGNOSIS — C50911 Malignant neoplasm of unspecified site of right female breast: Secondary | ICD-10-CM

## 2015-04-10 DIAGNOSIS — I480 Paroxysmal atrial fibrillation: Secondary | ICD-10-CM | POA: Diagnosis not present

## 2015-04-10 DIAGNOSIS — M8580 Other specified disorders of bone density and structure, unspecified site: Secondary | ICD-10-CM | POA: Diagnosis not present

## 2015-04-10 DIAGNOSIS — C50919 Malignant neoplasm of unspecified site of unspecified female breast: Secondary | ICD-10-CM

## 2015-04-10 LAB — CBC WITH DIFFERENTIAL/PLATELET
Basophils Absolute: 0.1 10*3/uL (ref 0.0–0.1)
Basophils Relative: 1 % (ref 0–1)
Eosinophils Absolute: 0.1 10*3/uL (ref 0.0–0.7)
Eosinophils Relative: 3 % (ref 0–5)
HEMATOCRIT: 37.9 % (ref 36.0–46.0)
Hemoglobin: 12.6 g/dL (ref 12.0–15.0)
LYMPHS ABS: 0.8 10*3/uL (ref 0.7–4.0)
Lymphocytes Relative: 21 % (ref 12–46)
MCH: 34.9 pg — AB (ref 26.0–34.0)
MCHC: 33.2 g/dL (ref 30.0–36.0)
MCV: 105 fL — AB (ref 78.0–100.0)
Monocytes Absolute: 0.4 10*3/uL (ref 0.1–1.0)
Monocytes Relative: 11 % (ref 3–12)
Neutro Abs: 2.3 10*3/uL (ref 1.7–7.7)
Neutrophils Relative %: 64 % (ref 43–77)
PLATELETS: 141 10*3/uL — AB (ref 150–400)
RBC: 3.61 MIL/uL — ABNORMAL LOW (ref 3.87–5.11)
RDW: 13.9 % (ref 11.5–15.5)
WBC: 3.7 10*3/uL — ABNORMAL LOW (ref 4.0–10.5)

## 2015-04-10 LAB — COMPREHENSIVE METABOLIC PANEL
ALK PHOS: 127 U/L — AB (ref 38–126)
ALT: 22 U/L (ref 14–54)
AST: 34 U/L (ref 15–41)
Albumin: 3.9 g/dL (ref 3.5–5.0)
Anion gap: 8 (ref 5–15)
BUN: 18 mg/dL (ref 6–20)
CHLORIDE: 106 mmol/L (ref 101–111)
CO2: 28 mmol/L (ref 22–32)
Calcium: 8.8 mg/dL — ABNORMAL LOW (ref 8.9–10.3)
Creatinine, Ser: 0.86 mg/dL (ref 0.44–1.00)
GFR calc non Af Amer: >60 mL/min (ref >60)
GLUCOSE: 88 mg/dL (ref 65–99)
POTASSIUM: 3.6 mmol/L (ref 3.5–5.1)
SODIUM: 142 mmol/L (ref 135–145)
TOTAL PROTEIN: 6.2 g/dL — AB (ref 6.5–8.1)
Total Bilirubin: 0.9 mg/dL (ref 0.3–1.2)

## 2015-04-10 LAB — PROTIME-INR: INR: 2.5 — AB (ref 0.9–1.1)

## 2015-04-10 NOTE — Telephone Encounter (Signed)
See coumadin note. 

## 2015-04-10 NOTE — Progress Notes (Signed)
Labs drawn

## 2015-05-07 ENCOUNTER — Ambulatory Visit (INDEPENDENT_AMBULATORY_CARE_PROVIDER_SITE_OTHER): Payer: Medicare Other | Admitting: *Deleted

## 2015-05-07 DIAGNOSIS — I4891 Unspecified atrial fibrillation: Secondary | ICD-10-CM | POA: Diagnosis not present

## 2015-05-07 DIAGNOSIS — I480 Paroxysmal atrial fibrillation: Secondary | ICD-10-CM | POA: Diagnosis not present

## 2015-05-07 DIAGNOSIS — Z5181 Encounter for therapeutic drug level monitoring: Secondary | ICD-10-CM

## 2015-05-07 DIAGNOSIS — Z7901 Long term (current) use of anticoagulants: Secondary | ICD-10-CM | POA: Diagnosis not present

## 2015-05-07 LAB — PROTIME-INR: INR: 2 — AB (ref 0.9–1.1)

## 2015-05-12 ENCOUNTER — Other Ambulatory Visit: Payer: Self-pay | Admitting: Cardiology

## 2015-05-19 DIAGNOSIS — M25532 Pain in left wrist: Secondary | ICD-10-CM | POA: Diagnosis not present

## 2015-05-19 DIAGNOSIS — M779 Enthesopathy, unspecified: Secondary | ICD-10-CM | POA: Diagnosis not present

## 2015-05-19 DIAGNOSIS — Z853 Personal history of malignant neoplasm of breast: Secondary | ICD-10-CM | POA: Diagnosis not present

## 2015-05-19 DIAGNOSIS — S63502A Unspecified sprain of left wrist, initial encounter: Secondary | ICD-10-CM | POA: Diagnosis not present

## 2015-05-19 DIAGNOSIS — I10 Essential (primary) hypertension: Secondary | ICD-10-CM | POA: Diagnosis not present

## 2015-05-19 DIAGNOSIS — I4891 Unspecified atrial fibrillation: Secondary | ICD-10-CM | POA: Diagnosis not present

## 2015-05-29 ENCOUNTER — Ambulatory Visit (HOSPITAL_COMMUNITY)
Admission: RE | Admit: 2015-05-29 | Discharge: 2015-05-29 | Disposition: A | Discharge status: Home / Self Care | Payer: Medicare Other | Source: Ambulatory Visit | Attending: Orthopaedic Surgery | Admitting: Orthopaedic Surgery

## 2015-05-29 ENCOUNTER — Other Ambulatory Visit (HOSPITAL_COMMUNITY): Payer: Self-pay | Admitting: Orthopaedic Surgery

## 2015-05-29 DIAGNOSIS — M1288 Other specific arthropathies, not elsewhere classified, other specified site: Secondary | ICD-10-CM | POA: Diagnosis not present

## 2015-05-29 DIAGNOSIS — M1611 Unilateral primary osteoarthritis, right hip: Secondary | ICD-10-CM | POA: Insufficient documentation

## 2015-05-29 DIAGNOSIS — M25551 Pain in right hip: Secondary | ICD-10-CM | POA: Insufficient documentation

## 2015-05-29 DIAGNOSIS — I10 Essential (primary) hypertension: Secondary | ICD-10-CM | POA: Diagnosis not present

## 2015-05-29 DIAGNOSIS — Z8719 Personal history of other diseases of the digestive system: Secondary | ICD-10-CM | POA: Diagnosis not present

## 2015-05-29 DIAGNOSIS — I509 Heart failure, unspecified: Secondary | ICD-10-CM | POA: Diagnosis not present

## 2015-05-29 DIAGNOSIS — I4891 Unspecified atrial fibrillation: Secondary | ICD-10-CM | POA: Diagnosis not present

## 2015-05-29 DIAGNOSIS — Z6823 Body mass index (BMI) 23.0-23.9, adult: Secondary | ICD-10-CM | POA: Diagnosis not present

## 2015-05-29 DIAGNOSIS — Z79899 Other long term (current) drug therapy: Secondary | ICD-10-CM | POA: Diagnosis not present

## 2015-05-30 DIAGNOSIS — I509 Heart failure, unspecified: Secondary | ICD-10-CM | POA: Diagnosis not present

## 2015-05-30 DIAGNOSIS — I10 Essential (primary) hypertension: Secondary | ICD-10-CM | POA: Diagnosis not present

## 2015-05-30 DIAGNOSIS — M25551 Pain in right hip: Secondary | ICD-10-CM | POA: Diagnosis not present

## 2015-05-30 DIAGNOSIS — M7061 Trochanteric bursitis, right hip: Secondary | ICD-10-CM | POA: Diagnosis not present

## 2015-05-30 DIAGNOSIS — Z8719 Personal history of other diseases of the digestive system: Secondary | ICD-10-CM | POA: Diagnosis not present

## 2015-05-30 DIAGNOSIS — Z6823 Body mass index (BMI) 23.0-23.9, adult: Secondary | ICD-10-CM | POA: Diagnosis not present

## 2015-06-04 ENCOUNTER — Ambulatory Visit (INDEPENDENT_AMBULATORY_CARE_PROVIDER_SITE_OTHER): Payer: Medicare Other | Admitting: *Deleted

## 2015-06-04 DIAGNOSIS — Z7901 Long term (current) use of anticoagulants: Secondary | ICD-10-CM

## 2015-06-04 DIAGNOSIS — Z5181 Encounter for therapeutic drug level monitoring: Secondary | ICD-10-CM

## 2015-06-04 DIAGNOSIS — I5022 Chronic systolic (congestive) heart failure: Secondary | ICD-10-CM | POA: Diagnosis not present

## 2015-06-04 DIAGNOSIS — Z23 Encounter for immunization: Secondary | ICD-10-CM | POA: Diagnosis not present

## 2015-06-04 DIAGNOSIS — I4891 Unspecified atrial fibrillation: Secondary | ICD-10-CM | POA: Diagnosis not present

## 2015-06-04 LAB — POCT INR: INR: 2.2

## 2015-06-11 ENCOUNTER — Encounter: Payer: Self-pay | Admitting: Genetic Counselor

## 2015-07-09 ENCOUNTER — Ambulatory Visit (INDEPENDENT_AMBULATORY_CARE_PROVIDER_SITE_OTHER): Payer: Medicare Other | Admitting: *Deleted

## 2015-07-09 DIAGNOSIS — Z7901 Long term (current) use of anticoagulants: Secondary | ICD-10-CM | POA: Diagnosis not present

## 2015-07-09 DIAGNOSIS — I4891 Unspecified atrial fibrillation: Secondary | ICD-10-CM | POA: Diagnosis not present

## 2015-07-09 DIAGNOSIS — Z5181 Encounter for therapeutic drug level monitoring: Secondary | ICD-10-CM | POA: Diagnosis not present

## 2015-07-09 LAB — POCT INR: INR: 4.4

## 2015-07-12 ENCOUNTER — Telehealth: Payer: Self-pay | Admitting: Genetic Counselor

## 2015-07-12 NOTE — Telephone Encounter (Signed)
patient called received f/u letter in mail appt schedule for 08/31 @ 10 w/Karen Florene Glen

## 2015-07-16 ENCOUNTER — Ambulatory Visit (INDEPENDENT_AMBULATORY_CARE_PROVIDER_SITE_OTHER): Payer: Medicare Other | Admitting: *Deleted

## 2015-07-16 DIAGNOSIS — Z7901 Long term (current) use of anticoagulants: Secondary | ICD-10-CM

## 2015-07-16 DIAGNOSIS — I4891 Unspecified atrial fibrillation: Secondary | ICD-10-CM | POA: Diagnosis not present

## 2015-07-16 DIAGNOSIS — Z5181 Encounter for therapeutic drug level monitoring: Secondary | ICD-10-CM | POA: Diagnosis not present

## 2015-07-16 LAB — POCT INR: INR: 2.3

## 2015-07-25 ENCOUNTER — Other Ambulatory Visit: Payer: Medicare Other

## 2015-07-25 ENCOUNTER — Encounter: Payer: Medicare Other | Admitting: Genetic Counselor

## 2015-07-25 DIAGNOSIS — M25571 Pain in right ankle and joints of right foot: Secondary | ICD-10-CM | POA: Diagnosis not present

## 2015-07-25 DIAGNOSIS — Z6821 Body mass index (BMI) 21.0-21.9, adult: Secondary | ICD-10-CM | POA: Diagnosis not present

## 2015-07-25 DIAGNOSIS — I10 Essential (primary) hypertension: Secondary | ICD-10-CM | POA: Diagnosis not present

## 2015-07-25 DIAGNOSIS — I509 Heart failure, unspecified: Secondary | ICD-10-CM | POA: Diagnosis not present

## 2015-07-25 DIAGNOSIS — Z8719 Personal history of other diseases of the digestive system: Secondary | ICD-10-CM | POA: Diagnosis not present

## 2015-07-25 DIAGNOSIS — M109 Gout, unspecified: Secondary | ICD-10-CM | POA: Diagnosis not present

## 2015-07-25 DIAGNOSIS — Z23 Encounter for immunization: Secondary | ICD-10-CM | POA: Diagnosis not present

## 2015-07-26 ENCOUNTER — Encounter: Payer: Self-pay | Admitting: Genetic Counselor

## 2015-07-26 ENCOUNTER — Ambulatory Visit (HOSPITAL_BASED_OUTPATIENT_CLINIC_OR_DEPARTMENT_OTHER): Payer: Medicare Other | Admitting: Genetic Counselor

## 2015-07-26 ENCOUNTER — Other Ambulatory Visit: Payer: Medicare Other

## 2015-07-26 DIAGNOSIS — Z853 Personal history of malignant neoplasm of breast: Secondary | ICD-10-CM

## 2015-07-26 DIAGNOSIS — Z8041 Family history of malignant neoplasm of ovary: Secondary | ICD-10-CM

## 2015-07-26 DIAGNOSIS — Z809 Family history of malignant neoplasm, unspecified: Secondary | ICD-10-CM

## 2015-07-26 DIAGNOSIS — Z8052 Family history of malignant neoplasm of bladder: Secondary | ICD-10-CM

## 2015-07-26 DIAGNOSIS — Z8042 Family history of malignant neoplasm of prostate: Secondary | ICD-10-CM

## 2015-07-26 DIAGNOSIS — Z803 Family history of malignant neoplasm of breast: Secondary | ICD-10-CM

## 2015-07-26 NOTE — Progress Notes (Signed)
REFERRING PROVIDER: Ancil Linsey, MD Roma Kayser, MS, CGC  PRIMARY PROVIDER:  [name], MD  PRIMARY REASON FOR VISIT:  1. History of breast cancer in female   2. Family history of breast cancer in female   3. Family history of ovarian cancer   4. Family history of prostate cancer   5. Family history of bladder cancer   6. Family history of cancer      HISTORY OF PRESENT ILLNESS:   Alicia Harrington, a 81 y.o. female, was first seen for a [place] cancer genetics consultation in 2009 at which time she had negative BRACAnalysis testing (BRCA1/2 sequencing, with 5-site rearrangement analysis and without BART).  She returns to the genetics clinic today, following receipt of our recall letter for updated genetic testing. Alicia Harrington presents to clinic today with her husband to discuss the possibility of a hereditary predisposition to cancer, updated genetic testing options, and to further clarify her future cancer risks, as well as potential cancer risks for family members.   In 2009, at the age of 25, Alicia Harrington was diagnosed with invasive ductal carcinoma with DCIS of the right breast.  Hormone receptor status was triple positive with a Ki67 of 45%.  This was treated with right lumpectomy, chemotherapy, and radiation therapy.  Alicia Harrington has had no additional breast or other cancers since that time.   CANCER HISTORY:    Adenocarcinoma, breast   01/17/2015 Survivorship BCI shows a high risk of later recurrence (9.4% between years 5-10) and low likelihood of benefit from extended endocrine therapy .  Without extended endocrine therapy, patient has a 13% risk of recurrence and 9% risk of recurrence with extended therapy.     HORMONAL RISK FACTORS:  Menarche was at age 73.  First live birth at age - not asked.  OCP use for approximately 0 years.  Ovaries intact: yes.  Hysterectomy: no.  Menopausal status: postmenopausal.  HRT use: yes, combined HRT for approx 10 years   Colonoscopy: yes; hx of 2 inflammatory, non-adenomatous polyps; most recent colonoscopy was in 2013. Mammogram within the last year: yes. Number of breast biopsies: 1. Up to date with pelvic exams:  n/a. Any excessive radiation exposure in the past:  no  Past Medical History  Diagnosis Date  . Cardiomyopathy 2009    possibly secondary to Adriamycin; EF 40% in 12/09 and 50% 3/10; pulmonary edema in 2009  . Chronic kidney disease (CKD), stage I     when on diuretic with creatinine of 1.6  . Hypertension   . Adenocarcinoma, breast     treated with lumpectomy, node dissection, chemo and RT  . Hearing impairment   . DJD (degenerative joint disease)     right knee and lumbosacral spine  . History of angina 2001    normal cornary arteries in 2001  . Macular degeneration, dry     + cataracts  . Chronic anticoagulation 04/16/2011  . Complication of anesthesia     patient vomits with demerol  . PONV (postoperative nausea and vomiting)   . Sick sinus syndrome     Paroxysmal to permanent AF; initially first degree AV block also noted  . Neuropathy     fingers and toes  . Ataxia   . Retinal macular atrophy   . Breast cancer 2009    IDC+DCIS of right breast; triple positive    Past Surgical History  Procedure Laterality Date  . Appendectomy    . Dilation and curettage of uterus    .  Middle ear surgery      Left  . Tonsillectomy    . Microdiscectomy lumbar  2009    lumbosacral spine  . Mastectomy partial / lumpectomy w/ axillary lymphadenectomy  2009    Carcinoma of the breast  . Colonoscopy  02/11/2012    Colonoscopy plus polypectomy x2, Rehman  . Port-a-cath removal    . Cataract extraction w/phaco  10/11/2012    Procedure: CATARACT EXTRACTION PHACO AND INTRAOCULAR LENS PLACEMENT (IOC);  Surgeon: Williams Che, MD;  Location: [facility];  Service: Ophthalmology;  Laterality: Right;  CDE:10.82  . Esophagogastroduodenoscopy N/A 01/19/2015    Procedure: ESOPHAGOGASTRODUODENOSCOPY  (EGD);  Surgeon: Rogene Houston, MD;  [facility];  Service: Endoscopy;  Laterality: N/A;  910  . Esophageal dilation N/A 01/19/2015    Procedure: ESOPHAGEAL DILATION;  Surgeon: Rogene Houston, MD;  [facility];  Service: Endoscopy;  Laterality: N/A;    Social History   Social History  . Marital Status: Married    Spouse Name: N/A  . Number of Children: N/A  . Years of Education: N/A   Occupational History  .      retired   Social History Main Topics  . Smoking status: Former Smoker    Start date: 11/24/1954    Quit date: 12/23/1955  . Smokeless tobacco: Never Used     Comment: quit at age 62; 0.5 pack per wk for one year  . Alcohol Use: 0.0 oz/week    0 Standard drinks or equivalent per week     Comment: one glass of wine per day  . Drug Use: No  . Sexual Activity: Not on file   Other Topics Concern  . Not on file   Social History Narrative   Pt lives in [place] with spouse.  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     FAMILY HISTORY:  We obtained a detailed, 4-generation family history.  Significant diagnoses are listed below: Family History  Problem Relation Age of Onset  . Heart failure Mother   . Bladder Cancer Maternal Uncle     smoker  . Ovarian cancer Maternal Grandmother     dx. 44s; +surgery  . Stroke Brother   . Breast cancer Maternal Aunt     dx. late 43s  . Breast cancer Cousin 19  . Prostate cancer Cousin 31  . Cancer Cousin     unspecified type  . Stroke Paternal Uncle     Alicia Harrington has one son who is currently 75 and a daughter who is 105.  Both are cancer-free.  Another son died at the age of 30 from what may have been a medication overdose.  Alicia Harrington has two full brothers.  One is currently 60 and, she and her husband think, might have pancreatic cancer.  Her other brother died of a stroke at 76.  Alicia Harrington's mother died at 62 from "mitral stenosis secondary to rheumatic fever".  Her father died at 57  of either a stroke or heart attack.    Alicia Harrington mother had one full sister and brother, as well as two paternal half-brothers and one paternal half-sister.  The half-sister was diagnosed with breast cancer in her late 46s; she had no children.  One half-brother died at 104 and was a smoker and heavy drinker.  The other half-brother died in his late 68s; he had one son and one daughter.  His daughter was diagnosed with breast cancer at  48, and she has no children.  Alicia Harrington mother's full sister died at 63 and never had cancer.  She had one son who was diagnosed with prostate cancer at 31.  He had no children.  Alicia Harrington mother's full brother had a history of genitourinary cancer--most likely bladder cancer.  He was a smoker.  One of his five children has had some type of cancer.  Alicia Harrington maternal grandmother died in her 6s of what was reportedly ovarian cancer.  No information is known about this grandmother's siblings or parents.  Alicia Harrington grandfather died of a UTI in his 57s.  Alicia Harrington father had three full brothers and one full sister.  Alicia Harrington had limited information for many of these relatives.  One uncle died in his late 45s-80s of a stroke.  Alicia Harrington is unaware the cause of death for her paternal grandparents.  She is unaware of any cancer history for any of these relatives, any paternal cousins, or any one else on this side of the family.  Patient's maternal and paternal ancestors are of [place] descent. There is no reported Ashkenazi Jewish ancestry. There is no known consanguinity.  GENETIC COUNSELING ASSESSMENT: Alicia Harrington is a 82 y.o. female with a personal and family history of cancer which is somewhat suggestive of a hereditary cancer syndrome and predisposition to cancer. We, therefore, discussed and recommended the following at today's visit.   DISCUSSION: We reviewed the characteristics, features and inheritance patterns of hereditary  cancer syndromes, particularly those caused by mutations within the BRCA1/2 genes, as well as some newer genes we have identified to have an associated risk for breast cancer. We also discussed genetic testing, including the appropriate family members to test, the process of testing, insurance coverage and turn-around-time for results. We discussed the implications of a negative, positive and/or variant of uncertain significant result. We recommended Alicia Harrington pursue genetic testing for the 20-gene Breast/Ovarian Cancer Panel through [facility] Hope Pigeon, MD).  The Breast/Ovarian gene panel offered by [facility] includes sequencing and deletion/duplication analysis for the following 19 genes:  ATM, BARD1, BRCA1, BRCA2, BRIP1, CDH1, CHEK2, FANCC, MLH1, MSH2, MSH6, NBN, PALB2, PMS2, PTEN, RAD51C, RAD51D, TP53, and XRCC2.  This panel also includes deletion/duplication analysis (without sequencing) for one gene, EPCAM.  Based on Ms. Fallen's personal and family history of cancer, she meets medical criteria for genetic testing. Despite that she meets criteria, she may still have an out of pocket cost. We discussed that if her out of pocket cost for testing is over $100, the laboratory will call and confirm whether she wants to proceed with testing.  If the out of pocket cost of testing is less than $100 she will be billed by the genetic testing laboratory.   PLAN: After considering the risks, benefits, and limitations, Alicia Harrington  provided informed consent to pursue genetic testing and the blood sample was sent to [facility] for analysis of the 20-gene Breast/Ovarian Cancer Panel test. Results should be available within approximately 2-3 weeks' time, at which point they will be disclosed by telephone to Alicia Harrington, as will any additional recommendations warranted by these results. Ms. Woodburn will receive a summary of her genetic counseling visit and a copy of her results once available.  This information will also be available in Epic. We encouraged Ms. Aerts to remain in contact with cancer genetics annually so that we can continuously update the family history and inform her of any changes in cancer genetics and  testing that may be of benefit for her family. Ms. Thackeray questions were answered to her satisfaction today. Our contact information was provided should additional questions or concerns arise.  Thank you for the referral and allowing [place] to share in the care of your patient.   Jeanine Luz, MS Genetic Counselor [email or web address] Phone: [phone]  The patient was seen for a total of 60 minutes in face-to-face genetic counseling.  This patient was discussed with Drs. Magrinat, Lindi Adie and/or Burr Medico who agrees with the above.    _______________________________________________________________________ For Office Staff:  Number of people involved in session: 2 Was an Intern/ student involved with case: no

## 2015-08-07 DIAGNOSIS — H4011X4 Primary open-angle glaucoma, indeterminate stage: Secondary | ICD-10-CM | POA: Diagnosis not present

## 2015-08-07 DIAGNOSIS — H2512 Age-related nuclear cataract, left eye: Secondary | ICD-10-CM | POA: Diagnosis not present

## 2015-08-07 DIAGNOSIS — Z961 Presence of intraocular lens: Secondary | ICD-10-CM | POA: Diagnosis not present

## 2015-08-07 DIAGNOSIS — H3531 Nonexudative age-related macular degeneration: Secondary | ICD-10-CM | POA: Diagnosis not present

## 2015-08-08 ENCOUNTER — Encounter (HOSPITAL_BASED_OUTPATIENT_CLINIC_OR_DEPARTMENT_OTHER): Payer: Medicare Other

## 2015-08-08 ENCOUNTER — Encounter (HOSPITAL_COMMUNITY): Payer: Medicare Other | Attending: Hematology & Oncology | Admitting: Hematology & Oncology

## 2015-08-08 ENCOUNTER — Ambulatory Visit (INDEPENDENT_AMBULATORY_CARE_PROVIDER_SITE_OTHER): Payer: Medicare Other | Admitting: *Deleted

## 2015-08-08 VITALS — BP 116/60 | HR 61 | Resp 16 | Wt 114.0 lb

## 2015-08-08 DIAGNOSIS — C50911 Malignant neoplasm of unspecified site of right female breast: Secondary | ICD-10-CM

## 2015-08-08 DIAGNOSIS — G4700 Insomnia, unspecified: Secondary | ICD-10-CM

## 2015-08-08 DIAGNOSIS — Z5181 Encounter for therapeutic drug level monitoring: Secondary | ICD-10-CM | POA: Diagnosis not present

## 2015-08-08 DIAGNOSIS — I4891 Unspecified atrial fibrillation: Secondary | ICD-10-CM

## 2015-08-08 DIAGNOSIS — Z7901 Long term (current) use of anticoagulants: Secondary | ICD-10-CM | POA: Diagnosis not present

## 2015-08-08 DIAGNOSIS — D7589 Other specified diseases of blood and blood-forming organs: Secondary | ICD-10-CM

## 2015-08-08 DIAGNOSIS — C50919 Malignant neoplasm of unspecified site of unspecified female breast: Secondary | ICD-10-CM

## 2015-08-08 LAB — CBC WITH DIFFERENTIAL/PLATELET
BASOS PCT: 1 %
Basophils Absolute: 0 10*3/uL (ref 0.0–0.1)
EOS ABS: 0.1 10*3/uL (ref 0.0–0.7)
Eosinophils Relative: 2 %
HCT: 39.8 % (ref 36.0–46.0)
HEMOGLOBIN: 13.6 g/dL (ref 12.0–15.0)
LYMPHS ABS: 0.9 10*3/uL (ref 0.7–4.0)
Lymphocytes Relative: 21 %
MCH: 36.1 pg — AB (ref 26.0–34.0)
MCHC: 34.2 g/dL (ref 30.0–36.0)
MCV: 105.6 fL — ABNORMAL HIGH (ref 78.0–100.0)
Monocytes Absolute: 0.3 10*3/uL (ref 0.1–1.0)
Monocytes Relative: 7 %
NEUTROS PCT: 69 %
Neutro Abs: 3 10*3/uL (ref 1.7–7.7)
Platelets: 127 10*3/uL — ABNORMAL LOW (ref 150–400)
RBC: 3.77 MIL/uL — ABNORMAL LOW (ref 3.87–5.11)
RDW: 13.8 % (ref 11.5–15.5)
WBC: 4.3 10*3/uL (ref 4.0–10.5)

## 2015-08-08 LAB — COMPREHENSIVE METABOLIC PANEL
ALK PHOS: 109 U/L (ref 38–126)
ALT: 31 U/L (ref 14–54)
ANION GAP: 6 (ref 5–15)
AST: 41 U/L (ref 15–41)
Albumin: 4.1 g/dL (ref 3.5–5.0)
BUN: 24 mg/dL — ABNORMAL HIGH (ref 6–20)
CALCIUM: 9.6 mg/dL (ref 8.9–10.3)
CO2: 31 mmol/L (ref 22–32)
Chloride: 102 mmol/L (ref 101–111)
Creatinine, Ser: 1.05 mg/dL — ABNORMAL HIGH (ref 0.44–1.00)
GFR calc non Af Amer: 48 mL/min — ABNORMAL LOW (ref >60)
GFR, EST AFRICAN AMERICAN: 56 mL/min — AB (ref >60)
Glucose, Bld: 111 mg/dL — ABNORMAL HIGH (ref 65–99)
POTASSIUM: 3.8 mmol/L (ref 3.5–5.1)
SODIUM: 139 mmol/L (ref 135–145)
TOTAL PROTEIN: 6.4 g/dL — AB (ref 6.5–8.1)
Total Bilirubin: 1.6 mg/dL — ABNORMAL HIGH (ref 0.3–1.2)

## 2015-08-08 LAB — POCT INR: INR: 1.9

## 2015-08-08 MED ORDER — ZOLPIDEM TARTRATE 10 MG PO TABS: 10.0000 mg | ORAL_TABLET | Freq: Every evening | ORAL | Status: DC | PRN

## 2015-08-08 MED ORDER — ONDANSETRON HCL 8 MG PO TABS: 8.0000 mg | ORAL_TABLET | Freq: Three times a day (TID) | ORAL | Status: DC | PRN

## 2015-08-08 NOTE — Progress Notes (Signed)
Alicia Harrington, [place] [address]    DIAGNOSIS:  Stage II R breast cancer Treated under NSABP 41 protocol. Could not tolerate Taxol plus trastuzumab secondary to heart failure June 2010 to August 2014 Tamoxifen August 2014 to 12/30/2013 Arimidex Tamoxifen 10 daily on 12/30/2013 by Dr. Stephenie Acres DEXA on 09/2013 with osteopenia Breast Cancer INDEX with score of 7.2, 9.4% risk of distant recurrance with a low likelihood of benefit from extended endocrine therapy   CURRENT THERAPY: Observation  INTERVAL HISTORY: Alicia Harrington 81 y.o. female returns for follow-up of a stage II breast cancer of the R breast.    Alicia Harrington is here today with her husband.   She states that she found a spot in her breast recently that hurt. She says it's a little sore now, but she can't feel anything (by way of lump) at this point. She says she reaches a lot with her right arm, and wonders if it's part of that, or part of being right-handed; but she says she doesn't recall bumping into anything or doing anything that could irritate it.  Her husband remarks that she has some new onset insomnia and new onset nausea, as well as vertigo. She states that she's used Ambien for insomnia before, but takes very little of it. She's had vertigo before due to her ear. She has been experiencing vertigo for the past three or four days. She feels that her vertigo is possibly triggering her nausea when she gets up in the morning, and is treating it with Zofran. Her husband remarks that she has been nauseated without being dizzy as well. Her husband remarks that she has portal hypertension and wonders if it could possibly contribute to her symptoms.  She says her "esophagus stretching" was better for two weeks, but then came back. She remarks that it is now something she just has to live with.  [name] and her husband are leaving tomorrow to attend their son's wedding in [place],  and will be gone for 6 weeks.   MEDICAL HISTORY: Past Medical History  Diagnosis Date  . Cardiomyopathy 2009    possibly secondary to Adriamycin; EF 40% in 12/09 and 50% 3/10; pulmonary edema in 2009  . Chronic kidney disease (CKD), stage I     when on diuretic with creatinine of 1.6  . Hypertension   . Adenocarcinoma, breast     treated with lumpectomy, node dissection, chemo and RT  . Hearing impairment   . DJD (degenerative joint disease)     right knee and lumbosacral spine  . History of angina 2001    normal cornary arteries in 2001  . Macular degeneration, dry     + cataracts  . Chronic anticoagulation 04/16/2011  . Complication of anesthesia     patient vomits with demerol  . PONV (postoperative nausea and vomiting)   . Sick sinus syndrome     Paroxysmal to permanent AF; initially first degree AV block also noted  . Neuropathy     fingers and toes  . Ataxia   . Retinal macular atrophy   . Breast cancer 2009    IDC+DCIS of right breast; triple positive    has HEARING IMPAIRMENT; HYPERTENSION; Atrial fibrillation; Chronic anticoagulation; Cardiomyopathy; Sick sinus syndrome; Chronic kidney disease (CKD), stage I; Adenocarcinoma, breast; DJD (degenerative joint disease); History of angina; Macular degeneration, dry; Fasting hyperglycemia; Verruca vulgaris; Encounter for therapeutic drug monitoring; Mitral regurgitation; Abdominal pain; History of breast cancer  in female; and Family history of breast cancer in female on her problem list.     is allergic to altace.  Alicia Harrington does not currently have medications on file.  SURGICAL HISTORY: Past Surgical History  Procedure Laterality Date  . Appendectomy    . Dilation and curettage of uterus    . Middle ear surgery      Left  . Tonsillectomy    . Microdiscectomy lumbar  2009    lumbosacral spine  . Mastectomy partial / lumpectomy w/ axillary lymphadenectomy  2009    Carcinoma of the breast  . Colonoscopy   02/11/2012    Colonoscopy plus polypectomy x2, Rehman  . Port-a-cath removal    . Cataract extraction w/phaco  10/11/2012    Procedure: CATARACT EXTRACTION PHACO AND INTRAOCULAR LENS PLACEMENT (IOC);  Surgeon: Williams Che, MD;  Location: [facility];  Service: Ophthalmology;  Laterality: Right;  CDE:10.82  . Esophagogastroduodenoscopy N/A 01/19/2015    Procedure: ESOPHAGOGASTRODUODENOSCOPY (EGD);  Surgeon: Rogene Houston, MD;  [facility];  Service: Endoscopy;  Laterality: N/A;  910  . Esophageal dilation N/A 01/19/2015    Procedure: ESOPHAGEAL DILATION;  Surgeon: Rogene Houston, MD;  [facility];  Service: Endoscopy;  Laterality: N/A;    SOCIAL HISTORY: Social History   Social History  . Marital Status: Married    Spouse Name: N/A  . Number of Children: N/A  . Years of Education: N/A   Occupational History  .      retired   Social History Main Topics  . Smoking status: Former Smoker    Start date: 11/24/1954    Quit date: 12/23/1955  . Smokeless tobacco: Never Used     Comment: quit at age 18; 0.5 pack per wk for one year  . Alcohol Use: 0.0 oz/week    0 Standard drinks or equivalent per week     Comment: one glass of wine per day  . Drug Use: No  . Sexual Activity: Not on file   Other Topics Concern  . Not on file   Social History Narrative   Pt lives in [place] with spouse.  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    FAMILY HISTORY: Family History  Problem Relation Age of Onset  . Heart failure Mother   . Bladder Cancer Maternal Uncle     smoker  . Ovarian cancer Maternal Grandmother     dx. 96s; +surgery  . Stroke Brother   . Breast cancer Maternal Aunt     dx. late 30s  . Breast cancer Cousin 29  . Prostate cancer Cousin 67  . Cancer Cousin     unspecified type  . Stroke Paternal Uncle     Review of Systems  Constitutional: Positive for malaise/fatigue.  HENT: Negative.   Eyes: Negative.   Respiratory: Negative.     Cardiovascular: Negative.   Gastrointestinal: Negative.   Genitourinary: Negative.   Musculoskeletal: Positive for joint pain.  Skin: Negative.   Neurological: Negative.   Endo/Heme/Allergies: Negative.   Psychiatric/Behavioral: Negative.    14 point review of systems was performed and is negative except as detailed under history of present illness and above   PHYSICAL EXAMINATION  ECOG PERFORMANCE STATUS: 1 - Symptomatic but completely ambulatory  Filed Vitals:   08/08/15 1308  BP: 116/60  Pulse: 61  Resp: 16    Physical Exam  Constitutional: She is oriented to person, place, and time and well-developed, well-nourished, and  in no distress.  HENT:  Head: Normocephalic and atraumatic.  Nose: Nose normal.  Mouth/Throat: Oropharynx is clear and moist. No oropharyngeal exudate.  Eyes: Conjunctivae and EOM are normal. Pupils are equal, round, and reactive to light. Right eye exhibits no discharge. Left eye exhibits no discharge. No scleral icterus.  Neck: Normal range of motion. Neck supple. No tracheal deviation present. No thyromegaly present.  Cardiovascular: Normal rate, regular rhythm and normal heart sounds.  Exam reveals no gallop and no friction rub.   No murmur heard. Pulmonary/Chest: Effort normal and breath sounds normal. She has no wheezes. She has no rales.  Bilateral breast exam performed and unremarkable for palpable abnormalities, no nipple changes and no skin retraction or dimpling. Bilateral axillae are clear  Abdominal: Soft. Bowel sounds are normal. She exhibits no distension and no mass. There is no tenderness. There is no rebound and no guarding.  Musculoskeletal: Normal range of motion. She exhibits no edema.  Lymphadenopathy:    She has no cervical adenopathy.  Neurological: She is alert and oriented to person, place, and time. She has normal reflexes. No cranial nerve deficit. Gait normal. Coordination normal.  Skin: Skin is warm and dry. No rash noted.   Psychiatric: Mood, memory, affect and judgment normal.  Nursing note and vitals reviewed.   LABORATORY DATA:  I have reviewed the labs below.  CBC    Component Value Date/Time   WBC 4.3 08/08/2015 1259   RBC 3.77* 08/08/2015 1259   HGB 13.6 08/08/2015 1259   HCT 39.8 08/08/2015 1259   PLT 127* 08/08/2015 1259   MCV 105.6* 08/08/2015 1259   MCH 36.1* 08/08/2015 1259   MCHC 34.2 08/08/2015 1259   RDW 13.8 08/08/2015 1259   LYMPHSABS 0.9 08/08/2015 1259   MONOABS 0.3 08/08/2015 1259   EOSABS 0.1 08/08/2015 1259   BASOSABS 0.0 08/08/2015 1259   CMP     Component Value Date/Time   NA 139 08/08/2015 1259   K 3.8 08/08/2015 1259   CL 102 08/08/2015 1259   CO2 31 08/08/2015 1259   GLUCOSE 111* 08/08/2015 1259   BUN 24* 08/08/2015 1259   CREATININE 1.05* 08/08/2015 1259   CREATININE 0.98 04/07/2013 1154   CALCIUM 9.6 08/08/2015 1259   PROT 6.4* 08/08/2015 1259   ALBUMIN 4.1 08/08/2015 1259   AST 41 08/08/2015 1259   ALT 31 08/08/2015 1259   ALKPHOS 109 08/08/2015 1259   BILITOT 1.6* 08/08/2015 1259   GFRNONAA 48* 08/08/2015 1259   GFRAA 56* 08/08/2015 1259     ASSESSMENT and THERAPY PLAN:  Stage II R breast cancer Treated under NSABP 41 protocol. Could not tolerate Taxol plus trastuzumab secondary to heart failure Osteopenia BCI predictive with low likelihood of benefit from extended adjuvant therapy Macrocytosis with normal B12 and folate Thrombocytopenia Mild hyperbilirubinemia, intermittent  Her breast exam today was unremarkable. I advised the patient that if her breast pain continues or she notices a palpable mass to let us know and on the return from her vacation we will set her up for mammography.. She has completed all recommended therapy for her prior right breast cancer.  She has a macrocytosis which I suspect may be posttreatment related but we will continue with ongoing follow-up. Platelet count is mildly low today and we will recheck labs again prior  to her next 6 month visit. She has intermittent hyperbilirubinemia. She does drink wine every evening. She has cardiomyopathy and paroxysmal A. fib. She has no known history of liver dysfunction  or disease.  I have provided Mrs. Delorey with a prescription for 10mg  Ambien. I will also refill her prescription for Zofran 4mg .  We can schedule follow-up liver function testing after her return from [facility].  All questions were answered. The patient knows to call the clinic with any problems, questions or concerns. We can certainly see the patient much sooner if necessary.  This document serves as a record of services personally performed by Ancil Linsey, MD. It was created on her behalf by Toni Amend, a trained medical scribe. The creation of this record is based on the scribe's personal observations and the provider's statements to them. This document has been checked and approved by the attending provider.  I have reviewed the above documentation for accuracy and completeness, and I agree with the above.  This note was signed electronically Molli Hazard, MD

## 2015-08-08 NOTE — Patient Instructions (Signed)
[place] at [facility] Discharge Instructions  RECOMMENDATIONS MADE BY THE CONSULTANT AND ANY TEST RESULTS WILL BE SENT TO YOUR REFERRING PHYSICIAN.  Exam per Dr.Penland.  Return in 4 months as scheduled. Report any issues/concerns as needed prior to appointment.  Thank you for choosing [place] at [facility] to provide your oncology and hematology care.  To afford each patient quality time with our provider, please arrive at least 15 minutes before your scheduled appointment time.    You need to re-schedule your appointment should you arrive 10 or more minutes late.  We strive to give you quality time with our providers, and arriving late affects you and other patients whose appointments are after yours.  Also, if you no show three or more times for appointments you may be dismissed from the clinic at the providers discretion.     Again, thank you for choosing [facility].  Our hope is that these requests will decrease the amount of time that you wait before being seen by our physicians.       _____________________________________________________________  Should you have questions after your visit to [facility], please contact our office at [phone] between the hours of 8:30 a.m. and 4:30 p.m.  Voicemails left after 4:30 p.m. will not be returned until the following business day.  For prescription refill requests, have your pharmacy contact our office.

## 2015-08-09 ENCOUNTER — Encounter (HOSPITAL_COMMUNITY): Payer: Self-pay | Admitting: Hematology & Oncology

## 2015-08-09 NOTE — Progress Notes (Signed)
Labs drawn

## 2015-08-10 ENCOUNTER — Telehealth: Payer: Self-pay | Admitting: Genetic Counselor

## 2015-08-10 ENCOUNTER — Ambulatory Visit: Payer: Self-pay | Admitting: Genetic Counselor

## 2015-08-10 DIAGNOSIS — Z1379 Encounter for other screening for genetic and chromosomal anomalies: Secondary | ICD-10-CM

## 2015-08-10 NOTE — Telephone Encounter (Signed)
Discussed with Ms. Nicklaus and her husband (who was also on the line) that her genetic test results were negative for pathogenic mutations within any of 20 genes that would cause her to be at an increased risk for breast, ovarian, or other related cancers.  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  We reviewed that we treat these uncertain changes just like negative results and why we do that.  Most of the time these types of results do get reclassified as negative results.  Ms. Gillham is welcome to call us back in a year or two to get an update on this VUS, we also ask that she keep her contact information up-to-date with [place], so that we may contact her when the lab eventually reclassifies this result.  Because Ms. Cochrane was diagnosed with breast cancer at a later age in life (and many of the family members who have had cancer have been diagnosed at later ages as well) this result is likely reassuring for [place] that Ms. Vandezande's cancer just happened by chance.  This result does not change any cancer screening for Ms. Fotopoulos or for her relatives.  However, cancer screening would still be based upon the family and personal history, thus, women in the family are considered to be at a somewhat increased risk for breast cancer based on the history of breast cancer.  Ms. Brodhead daughter and her nieces should begin annual mammogram screening a bit earlier, at the age of 71.  Ms. Stanfield and her family members are welcome to call with any additional questions.  Ms. Szydlowski would like a copy of her test results mailed to her, and I am happy to do that.  I will mail a copy of her results with a note discussing everything that we talked about over the phone today.

## 2015-08-13 DIAGNOSIS — Z1379 Encounter for other screening for genetic and chromosomal anomalies: Secondary | ICD-10-CM | POA: Insufficient documentation

## 2015-08-13 NOTE — Progress Notes (Signed)
GENETIC TEST RESULTS  HPI: Ms. Turnbaugh was first seen in in the [facility] due to a personal and family history of cancer and concerns regarding a hereditary predisposition to cancer.  At that time, she had negative BRACAnalysis testing (BRCA1/2 sequencing w/o BRCA1 5-site rearrangement, w/o BART) through [facility] ([place], [place]).  She returned to clinic on July 26, 2015 with her husband, following receipt of our recall letter regarding updated genetic testing options.  Please refer to our prior cancer genetics clinic from July 26, 2015, note for more information regarding Ms. Ramella's medical, social and family histories, and our assessment and recommendations, at the time. Ms. Bradshaw recent genetic test results were disclosed to her, as were recommendations warranted by these results. These results and recommendations are discussed in more detail below.  GENETIC TEST RESULTS: At the time of Ms. Greenhouse's visit on 07/26/15, we recommended she pursue genetic testing of the 20-gene Breast/Ovarian Cancer Panel through [facility] Hope Pigeon, MD).  The Breast/Ovarian Cancer Panel offered by [facility] includes sequencing and deletion/duplication analysis for the following 19 genes:  ATM, BARD1, BRCA1, BRCA2, BRIP1, CDH1, CHEK2, FANCC, MLH1, MSH2, MSH6, NBN, PALB2, PMS2, PTEN, RAD51C, RAD51D, TP53, and XRCC2.  This panel also includes deletion/duplication analysis (without sequencing) for one gene, EPCAM.  Those results are now back, the report date for which is August 09, 2015.  Genetic testing was normal, and did not reveal a deleterious mutation in these genes.  One variant of uncertain significance (VUS) called "c.-1058T>G" was found on one copy of the PTEN gene.  The test report will be scanned into EPIC and will be located under the Results Review tab in the Molecular Pathology section.   Genetic testing did identify a variant of  uncertain significance (VUS) called "c.-1058T>G" in the PTEN gene. At this time, it is unknown if this VUS is associated with an increased risk for cancer or if this is a normal finding. Since this VUS result is uncertain, it cannot help guide screening recommendations, and family members should not be tested for this VUS to help define their own cancer risks.  Also, we all have variants within our genes that make [place] unique individuals--most of these variants are benign.  Thus, we treat this VUS as a negative result.   With time, we suspect the lab will reclassify this variant and when they do, we will try to re-contact Ms. Arai to discuss the reclassification further.  We also encouraged Ms. Galindez to contact us in a year or two to obtain an update on the status of this VUS.  We discussed with Ms. Artley that since the current genetic testing is not perfect, it is possible there may be a gene mutation in one of these genes that current testing cannot detect, but that chance is small. We also discussed, that it is possible that another gene that has not yet been discovered, or that we have not yet tested, is responsible for the cancer diagnoses in the family, and it is, therefore, important to remain in touch with cancer genetics in the future so that we can continue to offer Ms. Lalley the most up to date genetic testing.   CANCER SCREENING RECOMMENDATIONS: While we still do not have an explanation for the personal or family history of cancer, this result is reassuring and indicates that Ms. Mcmanigal likely does not have an increased risk for a future cancer due to a mutation in one of these  genes. This normal test also suggests that Ms. Tanksley's cancer was most likely not due to an inherited predisposition associated with one of these genes.  Additionally, of the individuals in Ms. Bellevue's family who have had cancer, many have been diagnosed at later ages in life, including Ms. Dumlao's own  breast cancer diagnosis.  Most cancers happen by chance and this negative test suggests that her cancer falls into this category.  We, therefore, recommended she continue to follow the cancer management and screening guidelines provided by her oncology and primary healthcare providers.   RECOMMENDATIONS FOR FAMILY MEMBERS: Women in this family might be at some increased risk of developing cancer, over the general population risk, simply due to the family history of cancer. We recommended women in this family have a yearly mammogram beginning at age 14, or 64 years younger than the earliest onset of cancer, an an annual clinical breast exam, and perform monthly breast self-exams. Thus Ms. Mao's daughter as well as her nieces should already be having annual mammogram screening.  Women in this family should also have a gynecological exam as recommended by their primary provider. All family members should have a colonoscopy by age 26.  FOLLOW-UP: Lastly, we discussed with Ms. Sylvestre that cancer genetics is a rapidly advancing field and it is possible that new genetic tests will be appropriate for her and/or her family members in the future. We encouraged her to remain in contact with cancer genetics on an annual basis so we can update her personal and family histories and let her know of advances in cancer genetics that may benefit this family.  Ms. Blackwell would like a copy of her test results mailed to her, and we are happy to oblige.  Our contact number was provided. Ms. Limburg questions were answered to her satisfaction, and she knows she is welcome to call us at anytime with additional questions or concerns.   Jeanine Luz, MS Genetic Counselor [email or web address] Phone: [phone]

## 2015-08-20 ENCOUNTER — Encounter: Payer: Self-pay | Admitting: Genetic Counselor

## 2015-08-22 ENCOUNTER — Other Ambulatory Visit (HOSPITAL_COMMUNITY): Payer: Medicare Other

## 2015-08-22 ENCOUNTER — Ambulatory Visit (HOSPITAL_COMMUNITY): Payer: Medicare Other | Admitting: Hematology & Oncology

## 2015-08-29 ENCOUNTER — Ambulatory Visit (INDEPENDENT_AMBULATORY_CARE_PROVIDER_SITE_OTHER): Payer: Medicare Other | Admitting: Cardiology

## 2015-08-29 DIAGNOSIS — Z7901 Long term (current) use of anticoagulants: Secondary | ICD-10-CM | POA: Diagnosis not present

## 2015-08-29 DIAGNOSIS — I4891 Unspecified atrial fibrillation: Secondary | ICD-10-CM | POA: Diagnosis not present

## 2015-08-29 DIAGNOSIS — Z5181 Encounter for therapeutic drug level monitoring: Secondary | ICD-10-CM | POA: Diagnosis not present

## 2015-08-29 LAB — PROTIME-INR: INR: 1.7 — AB (ref <=1.1)

## 2015-09-10 DIAGNOSIS — Z7901 Long term (current) use of anticoagulants: Secondary | ICD-10-CM | POA: Diagnosis not present

## 2015-09-10 DIAGNOSIS — I4891 Unspecified atrial fibrillation: Secondary | ICD-10-CM | POA: Diagnosis not present

## 2015-09-11 ENCOUNTER — Ambulatory Visit (INDEPENDENT_AMBULATORY_CARE_PROVIDER_SITE_OTHER): Payer: Medicare Other | Admitting: *Deleted

## 2015-09-11 DIAGNOSIS — Z5181 Encounter for therapeutic drug level monitoring: Secondary | ICD-10-CM

## 2015-09-11 DIAGNOSIS — I480 Paroxysmal atrial fibrillation: Secondary | ICD-10-CM

## 2015-09-11 LAB — POCT INR: INR: 2.2

## 2015-09-26 ENCOUNTER — Ambulatory Visit (INDEPENDENT_AMBULATORY_CARE_PROVIDER_SITE_OTHER): Payer: Medicare Other | Admitting: *Deleted

## 2015-09-26 DIAGNOSIS — I4891 Unspecified atrial fibrillation: Secondary | ICD-10-CM | POA: Diagnosis not present

## 2015-09-26 DIAGNOSIS — Z5181 Encounter for therapeutic drug level monitoring: Secondary | ICD-10-CM | POA: Diagnosis not present

## 2015-09-26 LAB — POCT INR: INR: 3

## 2015-09-27 ENCOUNTER — Other Ambulatory Visit (HOSPITAL_COMMUNITY): Payer: Self-pay

## 2015-09-27 DIAGNOSIS — R945 Abnormal results of liver function studies: Secondary | ICD-10-CM

## 2015-09-27 DIAGNOSIS — R7989 Other specified abnormal findings of blood chemistry: Secondary | ICD-10-CM

## 2015-09-27 DIAGNOSIS — C50911 Malignant neoplasm of unspecified site of right female breast: Secondary | ICD-10-CM

## 2015-10-01 ENCOUNTER — Other Ambulatory Visit (HOSPITAL_COMMUNITY): Payer: Self-pay | Admitting: Pulmonary Disease

## 2015-10-01 ENCOUNTER — Ambulatory Visit (HOSPITAL_COMMUNITY)
Admission: RE | Admit: 2015-10-01 | Discharge: 2015-10-01 | Disposition: A | Discharge status: Home / Self Care | Payer: Medicare Other | Source: Ambulatory Visit | Attending: Pulmonary Disease | Admitting: Pulmonary Disease

## 2015-10-01 DIAGNOSIS — I517 Cardiomegaly: Secondary | ICD-10-CM | POA: Diagnosis not present

## 2015-10-01 DIAGNOSIS — R05 Cough: Secondary | ICD-10-CM | POA: Diagnosis not present

## 2015-10-01 DIAGNOSIS — H04123 Dry eye syndrome of bilateral lacrimal glands: Secondary | ICD-10-CM | POA: Diagnosis not present

## 2015-10-01 DIAGNOSIS — J984 Other disorders of lung: Secondary | ICD-10-CM | POA: Diagnosis not present

## 2015-10-01 DIAGNOSIS — H401132 Primary open-angle glaucoma, bilateral, moderate stage: Secondary | ICD-10-CM | POA: Diagnosis not present

## 2015-10-01 DIAGNOSIS — R059 Cough, unspecified: Secondary | ICD-10-CM

## 2015-10-01 DIAGNOSIS — H2512 Age-related nuclear cataract, left eye: Secondary | ICD-10-CM | POA: Diagnosis not present

## 2015-10-01 DIAGNOSIS — Z961 Presence of intraocular lens: Secondary | ICD-10-CM | POA: Diagnosis not present

## 2015-10-02 ENCOUNTER — Encounter (HOSPITAL_COMMUNITY): Payer: Medicare Other | Attending: Hematology & Oncology

## 2015-10-02 DIAGNOSIS — D7589 Other specified diseases of blood and blood-forming organs: Secondary | ICD-10-CM | POA: Insufficient documentation

## 2015-10-02 DIAGNOSIS — C50911 Malignant neoplasm of unspecified site of right female breast: Secondary | ICD-10-CM

## 2015-10-02 DIAGNOSIS — G4700 Insomnia, unspecified: Secondary | ICD-10-CM | POA: Diagnosis not present

## 2015-10-02 LAB — HEPATIC FUNCTION PANEL
ALBUMIN: 3.9 g/dL (ref 3.5–5.0)
ALK PHOS: 131 U/L — AB (ref 38–126)
ALT: 25 U/L (ref 14–54)
AST: 38 U/L (ref 15–41)
Bilirubin, Direct: 0.3 mg/dL (ref 0.1–0.5)
Indirect Bilirubin: 1 mg/dL — ABNORMAL HIGH (ref 0.3–0.9)
TOTAL PROTEIN: 6.1 g/dL — AB (ref 6.5–8.1)
Total Bilirubin: 1.3 mg/dL — ABNORMAL HIGH (ref 0.3–1.2)

## 2015-10-09 DIAGNOSIS — H6123 Impacted cerumen, bilateral: Secondary | ICD-10-CM | POA: Diagnosis not present

## 2015-10-09 DIAGNOSIS — H903 Sensorineural hearing loss, bilateral: Secondary | ICD-10-CM | POA: Diagnosis not present

## 2015-10-09 DIAGNOSIS — H8023 Cochlear otosclerosis, bilateral: Secondary | ICD-10-CM | POA: Diagnosis not present

## 2015-10-10 ENCOUNTER — Encounter: Payer: Self-pay | Admitting: Cardiology

## 2015-10-10 ENCOUNTER — Ambulatory Visit (INDEPENDENT_AMBULATORY_CARE_PROVIDER_SITE_OTHER): Payer: Medicare Other | Admitting: Cardiology

## 2015-10-10 VITALS — BP 129/72 | HR 99 | Ht 59.0 in | Wt 118.0 lb

## 2015-10-10 DIAGNOSIS — I4891 Unspecified atrial fibrillation: Secondary | ICD-10-CM

## 2015-10-10 DIAGNOSIS — I5022 Chronic systolic (congestive) heart failure: Secondary | ICD-10-CM | POA: Diagnosis not present

## 2015-10-10 DIAGNOSIS — I340 Nonrheumatic mitral (valve) insufficiency: Secondary | ICD-10-CM

## 2015-10-10 DIAGNOSIS — Z7901 Long term (current) use of anticoagulants: Secondary | ICD-10-CM | POA: Diagnosis not present

## 2015-10-10 DIAGNOSIS — R0602 Shortness of breath: Secondary | ICD-10-CM

## 2015-10-10 MED ORDER — FUROSEMIDE 20 MG PO TABS: ORAL_TABLET | ORAL | Status: DC

## 2015-10-10 MED ORDER — ALBUTEROL SULFATE HFA 108 (90 BASE) MCG/ACT IN AERS: 1.0000 | INHALATION_SPRAY | Freq: Four times a day (QID) | RESPIRATORY_TRACT | Status: DC | PRN

## 2015-10-10 NOTE — Patient Instructions (Addendum)
   Albuterol inhaler - may use 1-2 puffs every 6 hours as needed - new sent to [facility] today.  Increase your Lasix to 20mg  DAILY x 4 days only, then resume the every other day dosing thereafter. Continue all other medications.   Follow up in  2 months.

## 2015-10-10 NOTE — Progress Notes (Signed)
Patient ID: KEYARAH GRUNWALD, female   DOB: 09-Apr-1932, 81 y.o.   MRN: [id number]     Clinical Summary Ms. Wanlass is a 81 y.o.female seen today for follow up of the following medical problems.   1. Cardiomyopathy  - possibly chemo induced, secondary to Adriamycin.  - LVEF 40% 12/09, increased to 50% in 01/2009. Recent echo 10/2014 LVEF 40-45%.  - - notes some increased SOB over the last week. Had previously been doing well. DOE with 1/3 flight of stairs. Compliant with diuretic every other day. Notes some LE edema over the last few days. Weights at home 112-116, weight this AM at 112 lbs. - getting over bad cold that went on 10 days. Seen by Dr Luan Pulling, he did CXR 10/01/15, no pneumonia and no edema. Started on cough medicine and z-pack. Had productive cough. Notes some wheezing.    2. HTN  - does not check regularly at home  - compliant with meds   3. Paroxysmal afib  - rate control with Toprol, denies any recent palpitations.  - she is on anticoagulation, followed here in coumadin clinic. Denies any bleeding issues    4. Breast CA - followed by oncology     Past Medical History  Diagnosis Date  . Cardiomyopathy 2009    possibly secondary to Adriamycin; EF 40% in 12/09 and 50% 3/10; pulmonary edema in 2009  . Chronic kidney disease (CKD), stage I     when on diuretic with creatinine of 1.6  . Hypertension   . Adenocarcinoma, breast     treated with lumpectomy, node dissection, chemo and RT  . Hearing impairment   . DJD (degenerative joint disease)     right knee and lumbosacral spine  . History of angina 2001    normal cornary arteries in 2001  . Macular degeneration, dry     + cataracts  . Chronic anticoagulation 04/16/2011  . Complication of anesthesia     patient vomits with demerol  . PONV (postoperative nausea and vomiting)   . Sick sinus syndrome     Paroxysmal to permanent AF; initially first degree AV block also noted  . Neuropathy     fingers and  toes  . Ataxia   . Retinal macular atrophy   . Breast cancer 2009    IDC+DCIS of right breast; triple positive     Allergies  Allergen Reactions  . Altace [Ramipril] Rash     Current Outpatient Prescriptions  Medication Sig Dispense Refill  . acetaminophen (TYLENOL) 500 MG tablet Take 500 mg by mouth every 6 (six) hours as needed.    Marland Kitchen b complex vitamins tablet Take 1 tablet by mouth daily.    . bifidobacterium infantis (ALIGN) capsule Take 1 capsule by mouth daily. 14 capsule 0  . cholecalciferol (VITAMIN D) 1000 UNITS tablet Take 1,000 Units by mouth daily.      . Cyanocobalamin (VITAMIN B 12 PO) Take 1,000 mcg by mouth daily.    . diclofenac sodium (VOLTAREN) 1 % GEL Apply 1 application topically 4 (four) times daily as needed.    . digoxin (LANOXIN) 0.125 MG tablet Take 1 tablet (0.125 mg total) by mouth daily. 90 tablet 3  . furosemide (LASIX) 20 MG tablet TAKE 1 TABLET EVERY OTHER DAY 90 tablet 2  . hyoscyamine (LEVSIN SL) 0.125 MG SL tablet Place 1 tablet (0.125 mg total) under the tongue 3 (three) times daily before meals. (Patient not taking: Reported on 08/08/2015) 90 tablet 2  . latanoprost (  XALATAN) 0.005 % ophthalmic solution Place 1 drop into both eyes daily.  3  . metoprolol succinate (TOPROL-XL) 50 MG 24 hr tablet Take 1 tablet (50 mg total) by mouth daily. 90 tablet 3  . Multiple Vitamin (MULITIVITAMIN WITH MINERALS) TABS Take 1 tablet by mouth daily.    . Multiple Vitamins-Minerals (PRESERVISION AREDS 2 PO) Take 1 capsule by mouth 2 (two) times daily.    . ondansetron (ZOFRAN) 8 MG tablet Take 1 tablet (8 mg total) by mouth every 8 (eight) hours as needed for nausea or vomiting. 30 tablet 0  . pantoprazole (PROTONIX) 40 MG tablet Take 1 tablet (40 mg total) by mouth daily before breakfast. 30 tablet 5  . potassium chloride SA (KLOR-CON M20) 20 MEQ tablet Take 0.5 tablets (10 mEq total) by mouth daily. For potassium replacement. Only takes when taking Lasix 90 tablet 3    . tamoxifen (NOLVADEX) 10 MG tablet Take 1 tablet (10 mg) daily. (Patient not taking: Reported on 08/08/2015) 30 tablet 6  . traMADol (ULTRAM) 50 MG tablet Take 50 mg by mouth daily as needed. Only takes if necessary    . valsartan (DIOVAN) 80 MG tablet Take 1 tablet (80 mg total) by mouth daily. 90 tablet 3  . warfarin (COUMADIN) 5 MG tablet Take 1 tablet (5 mg total) by mouth daily at 6 PM. 90 tablet 3   No current facility-administered medications for this visit.     Past Surgical History  Procedure Laterality Date  . Appendectomy    . Dilation and curettage of uterus    . Middle ear surgery      Left  . Tonsillectomy    . Microdiscectomy lumbar  2009    lumbosacral spine  . Mastectomy partial / lumpectomy w/ axillary lymphadenectomy  2009    Carcinoma of the breast  . Colonoscopy  02/11/2012    Colonoscopy plus polypectomy x2, [facility]  . Port-a-cath removal    . Cataract extraction w/phaco  10/11/2012    Procedure: CATARACT EXTRACTION PHACO AND INTRAOCULAR LENS PLACEMENT (IOC);  Surgeon: Williams Che, MD;  Location: [facility];  Service: Ophthalmology;  Laterality: Right;  CDE:10.82  . Esophagogastroduodenoscopy N/A 01/19/2015    Procedure: ESOPHAGOGASTRODUODENOSCOPY (EGD);  Surgeon: Rogene Houston, MD;  Location: [facility];  Service: Endoscopy;  Laterality: N/A;  910  . Esophageal dilation N/A 01/19/2015    Procedure: ESOPHAGEAL DILATION;  Surgeon: Rogene Houston, MD;  Location: [facility];  Service: Endoscopy;  Laterality: N/A;     Allergies  Allergen Reactions  . Altace [Ramipril] Rash      Family History  Problem Relation Age of Onset  . Heart failure Mother   . Bladder Cancer Maternal Uncle     smoker  . Ovarian cancer Maternal Grandmother     dx. 45s; +surgery  . Stroke Brother   . Breast cancer Maternal Aunt     dx. late 79s  . Breast cancer Cousin 40  . Prostate cancer Cousin 23  . Cancer Cousin     unspecified type  . Stroke Paternal Uncle       Social History Ms. Nakada reports that she quit smoking about 59 years ago. She started smoking about 60 years ago. She has never used smokeless tobacco. Ms. Monts reports that she drinks alcohol.   Review of Systems CONSTITUTIONAL: No weight loss, fever, chills, weakness or fatigue.  HEENT: Eyes: No visual loss, blurred vision, double vision or yellow sclerae.No hearing loss, sneezing,  congestion, runny nose or sore throat.  SKIN: No rash or itching.  CARDIOVASCULAR: per hpi RESPIRATORY: No shortness of breath, cough or sputum.  GASTROINTESTINAL: No anorexia, nausea, vomiting or diarrhea. No abdominal pain or blood.  GENITOURINARY: No burning on urination, no polyuria NEUROLOGICAL: No headache, dizziness, syncope, paralysis, ataxia, numbness or tingling in the extremities. No change in bowel or bladder control.  MUSCULOSKELETAL: No muscle, back pain, joint pain or stiffness.  LYMPHATICS: No enlarged nodes. No history of splenectomy.  PSYCHIATRIC: No history of depression or anxiety.  ENDOCRINOLOGIC: No reports of sweating, cold or heat intolerance. No polyuria or polydipsia.  Marland Kitchen   Physical Examination Filed Vitals:   10/10/15 1349  BP: 129/72  Pulse: 99   Filed Vitals:   10/10/15 1349  Height: 4\' 11"  (1.499 m)  Weight: 118 lb (53.524 kg)    Gen: resting comfortably, no acute distress HEENT: no scleral icterus, pupils equal round and reactive, no palptable cervical adenopathy,  CV: RRR, no m/r/g, no jvd Resp: Clear to auscultation bilaterally GI: abdomen is soft, non-tender, non-distended, normal bowel sounds, no hepatosplenomegaly MSK: extremities are warm, no edema.  Skin: warm, no rash Neuro:  no focal deficits Psych: appropriate affect   Diagnostic Studies 12/2013 Echo LVEF 45-50%, elevated LA pressure, mild AI, moderate MR, mod TR, PASP 46  10/2014 Echo Study Conclusions  - Left ventricle: The cavity size was normal. Wall thickness was normal.  Systolic function was mildly to moderately reduced. The estimated ejection fraction was in the range of 40% to 45%. - Aortic valve: There was mild regurgitation. Regurgitation pressure half-time: 794 ms. - Mitral valve: Mildly calcified annulus. There was mild to moderate regurgitation. MR vena contracta is 0.4 cm. - Left atrium: The atrium was severely dilated. - Right ventricle: The interventricular septum is flattened in systole and diastole, consistent with RV pressure and volume overload. The cavity size was mildly dilated. Systolic function was low normal. RV TAPSE is 1.6 cm. - Right atrium: The atrium was severely dilated. - Atrial septum: No defect or patent foramen ovale was identified. - Pulmonary arteries: Systolic pressure was moderately increased. PA peak pressure: 60 mm Hg (S). - Technically adequate study.    Assessment and Plan  1. Chronic systolic HF - previous history of LV systolic dysfunction thought to be related to Adriamycin - last echo 10/2014 LVEF 40-45% - appears euvolemic today. Has had orthostatic symptoms with daily lasix before - recent SOB does not appear to be related. Weights are stable, no significant fluid overload by exam, CXR without pulmonary edema. Will try taking her lasix 4 days consistently to see if having her a little more on the dry side helps. Suspect SOB is related to recent URI with lingering hyper reactive airways. Will give prn albuterol  2. HTN  - at goal, continue current meds   3. Paroxysmal afib  - no current symptoms, continue current meds.  She is not interested in NOACs at this time, continue coumadin.   4. Mitral regurgitation - stable by recent echo 10/2014, mild to moderate - continue to follow   F/u 2 months   Arnoldo Lenis, M.D.

## 2015-10-11 ENCOUNTER — Other Ambulatory Visit (HOSPITAL_COMMUNITY): Payer: Medicare Other

## 2015-10-11 ENCOUNTER — Ambulatory Visit (HOSPITAL_COMMUNITY)
Admission: RE | Admit: 2015-10-11 | Discharge: 2015-10-11 | Disposition: A | Discharge status: Home / Self Care | Payer: Medicare Other | Source: Ambulatory Visit | Attending: Hematology & Oncology | Admitting: Hematology & Oncology

## 2015-10-11 DIAGNOSIS — M8580 Other specified disorders of bone density and structure, unspecified site: Secondary | ICD-10-CM | POA: Diagnosis not present

## 2015-10-11 DIAGNOSIS — Z780 Asymptomatic menopausal state: Secondary | ICD-10-CM | POA: Diagnosis not present

## 2015-10-11 DIAGNOSIS — Z853 Personal history of malignant neoplasm of breast: Secondary | ICD-10-CM | POA: Insufficient documentation

## 2015-10-11 DIAGNOSIS — M85832 Other specified disorders of bone density and structure, left forearm: Secondary | ICD-10-CM | POA: Diagnosis not present

## 2015-10-15 ENCOUNTER — Telehealth: Payer: Self-pay | Admitting: *Deleted

## 2015-10-15 ENCOUNTER — Ambulatory Visit (INDEPENDENT_AMBULATORY_CARE_PROVIDER_SITE_OTHER): Payer: Medicare Other | Admitting: *Deleted

## 2015-10-15 DIAGNOSIS — I480 Paroxysmal atrial fibrillation: Secondary | ICD-10-CM

## 2015-10-15 DIAGNOSIS — I4891 Unspecified atrial fibrillation: Secondary | ICD-10-CM

## 2015-10-15 DIAGNOSIS — Z5181 Encounter for therapeutic drug level monitoring: Secondary | ICD-10-CM

## 2015-10-15 DIAGNOSIS — Z7901 Long term (current) use of anticoagulants: Secondary | ICD-10-CM | POA: Diagnosis not present

## 2015-10-15 LAB — POCT INR: INR: 4.4

## 2015-10-15 NOTE — Telephone Encounter (Signed)
F/u from LOV w/SOB after taking lasix daily for 4 days. Pt feel SOB is somewhat better not any worse since LOV and denies any other symptoms, but did have a stressful weekend and realtes some SOB related to that. Weight has increased 2lbs since LOV. Pt did return to lasix qod as of yesterday. Will forward to Dr. Harl Bowie

## 2015-10-16 NOTE — Telephone Encounter (Signed)
Agree with going back to her every other day lasix. I would continue to monitor the symptoms for now. When I had seen her her CXR was clear and her weights were overall stable without evidence of extra fluid on exam. Hopefully this is just lingering from her recent severe cold, we will continue to monitor  Alicia Abts MD

## 2015-10-16 NOTE — Telephone Encounter (Signed)
Pt aware and will f/u 12/2015 with Dr. Harl Bowie. Will call back if symptoms do not improve.

## 2015-10-29 ENCOUNTER — Ambulatory Visit (INDEPENDENT_AMBULATORY_CARE_PROVIDER_SITE_OTHER): Payer: Medicare Other | Admitting: *Deleted

## 2015-10-29 ENCOUNTER — Telehealth: Payer: Self-pay | Admitting: *Deleted

## 2015-10-29 ENCOUNTER — Encounter (INDEPENDENT_AMBULATORY_CARE_PROVIDER_SITE_OTHER): Payer: Self-pay | Admitting: Internal Medicine

## 2015-10-29 DIAGNOSIS — I4891 Unspecified atrial fibrillation: Secondary | ICD-10-CM | POA: Diagnosis not present

## 2015-10-29 DIAGNOSIS — Z5181 Encounter for therapeutic drug level monitoring: Secondary | ICD-10-CM | POA: Diagnosis not present

## 2015-10-29 DIAGNOSIS — I480 Paroxysmal atrial fibrillation: Secondary | ICD-10-CM

## 2015-10-29 DIAGNOSIS — Z7901 Long term (current) use of anticoagulants: Secondary | ICD-10-CM | POA: Diagnosis not present

## 2015-10-29 LAB — POCT INR: INR: 2

## 2015-10-29 NOTE — Telephone Encounter (Signed)
-----   Message from Arnoldo Lenis, MD sent at 10/29/2015 11:31 AM EST ----- Received message from Dr Heide Spark about his wife Alicia Harrington, that she has had some orthopnea that improves with daily lasix. Ok to take lasix 20mg  daily at this time. Would monitor for any dizziness with standing as this had occurred in the past when she was on daily lasix, if she notices I would hold off on taking for 2-3 days until symptoms resolve. Can you ask if her weight has gone up or if she has any leg swelling?   Zandra Abts MD

## 2015-10-29 NOTE — Telephone Encounter (Signed)
Pt says weight remains between 110-112lbs says leg edema at +1. Pt will take lasix 20 mg daily and will let us know if dizziness occurs. Updated medication list

## 2015-10-30 ENCOUNTER — Other Ambulatory Visit (INDEPENDENT_AMBULATORY_CARE_PROVIDER_SITE_OTHER): Payer: Self-pay | Admitting: Internal Medicine

## 2015-10-30 DIAGNOSIS — R1310 Dysphagia, unspecified: Secondary | ICD-10-CM

## 2015-11-02 ENCOUNTER — Ambulatory Visit (HOSPITAL_COMMUNITY)
Admission: RE | Admit: 2015-11-02 | Discharge: 2015-11-02 | Disposition: A | Discharge status: Home / Self Care | Payer: Medicare Other | Source: Ambulatory Visit | Attending: Internal Medicine | Admitting: Internal Medicine

## 2015-11-02 DIAGNOSIS — R1310 Dysphagia, unspecified: Secondary | ICD-10-CM | POA: Diagnosis not present

## 2015-11-14 ENCOUNTER — Ambulatory Visit (INDEPENDENT_AMBULATORY_CARE_PROVIDER_SITE_OTHER): Payer: Medicare Other | Admitting: Pharmacist

## 2015-11-14 DIAGNOSIS — Z7901 Long term (current) use of anticoagulants: Secondary | ICD-10-CM

## 2015-11-14 DIAGNOSIS — I480 Paroxysmal atrial fibrillation: Secondary | ICD-10-CM

## 2015-11-14 DIAGNOSIS — I4891 Unspecified atrial fibrillation: Secondary | ICD-10-CM

## 2015-11-14 DIAGNOSIS — Z5181 Encounter for therapeutic drug level monitoring: Secondary | ICD-10-CM | POA: Diagnosis not present

## 2015-11-14 LAB — POCT INR: INR: 1.7

## 2015-12-05 DIAGNOSIS — I4891 Unspecified atrial fibrillation: Secondary | ICD-10-CM | POA: Diagnosis not present

## 2015-12-05 LAB — POCT INR: INR: 2

## 2015-12-06 ENCOUNTER — Encounter (INDEPENDENT_AMBULATORY_CARE_PROVIDER_SITE_OTHER): Payer: Self-pay | Admitting: Internal Medicine

## 2015-12-07 ENCOUNTER — Telehealth: Payer: Self-pay | Admitting: *Deleted

## 2015-12-07 ENCOUNTER — Ambulatory Visit (INDEPENDENT_AMBULATORY_CARE_PROVIDER_SITE_OTHER): Payer: Medicare Other | Admitting: Pharmacist

## 2015-12-07 DIAGNOSIS — I480 Paroxysmal atrial fibrillation: Secondary | ICD-10-CM

## 2015-12-07 DIAGNOSIS — Z5181 Encounter for therapeutic drug level monitoring: Secondary | ICD-10-CM

## 2015-12-07 NOTE — Telephone Encounter (Signed)
INR results / tg

## 2015-12-07 NOTE — Telephone Encounter (Signed)
See Anticoag encounter

## 2015-12-11 ENCOUNTER — Encounter: Payer: Self-pay | Admitting: Internal Medicine

## 2015-12-13 ENCOUNTER — Other Ambulatory Visit (HOSPITAL_COMMUNITY): Payer: Medicare Other

## 2015-12-13 ENCOUNTER — Ambulatory Visit (HOSPITAL_COMMUNITY): Payer: Medicare Other | Admitting: Hematology & Oncology

## 2015-12-27 DIAGNOSIS — I4891 Unspecified atrial fibrillation: Secondary | ICD-10-CM | POA: Diagnosis not present

## 2015-12-27 LAB — PROTIME-INR: INR: 2.3 — AB (ref 0.9–1.1)

## 2015-12-28 ENCOUNTER — Ambulatory Visit (INDEPENDENT_AMBULATORY_CARE_PROVIDER_SITE_OTHER): Payer: Medicare Other | Admitting: Pharmacist Clinician (PhC)/ Clinical Pharmacy Specialist

## 2015-12-28 DIAGNOSIS — I480 Paroxysmal atrial fibrillation: Secondary | ICD-10-CM

## 2015-12-28 DIAGNOSIS — Z5181 Encounter for therapeutic drug level monitoring: Secondary | ICD-10-CM

## 2016-01-01 ENCOUNTER — Telehealth: Payer: Self-pay

## 2016-01-01 NOTE — Telephone Encounter (Signed)
Pt did not get message from last week with coumadin instructions.  Informed pt that INR was 2.3.  Continue coumadin 5mg  daily.  Call when she get get bak home from [place] for next INR check.  She verbalized understanding.

## 2016-01-01 NOTE — Telephone Encounter (Signed)
Calling about her pt/inr

## 2016-01-14 ENCOUNTER — Other Ambulatory Visit (HOSPITAL_COMMUNITY): Payer: Self-pay | Admitting: Oncology

## 2016-01-14 ENCOUNTER — Encounter (HOSPITAL_COMMUNITY): Payer: Medicare Other | Attending: Hematology & Oncology

## 2016-01-14 DIAGNOSIS — Z79899 Other long term (current) drug therapy: Secondary | ICD-10-CM | POA: Diagnosis not present

## 2016-01-14 DIAGNOSIS — C50911 Malignant neoplasm of unspecified site of right female breast: Secondary | ICD-10-CM

## 2016-01-14 DIAGNOSIS — I509 Heart failure, unspecified: Secondary | ICD-10-CM | POA: Diagnosis not present

## 2016-01-14 DIAGNOSIS — I4891 Unspecified atrial fibrillation: Secondary | ICD-10-CM | POA: Diagnosis not present

## 2016-01-14 LAB — COMPREHENSIVE METABOLIC PANEL
ALBUMIN: 4.1 g/dL (ref 3.5–5.0)
ALT: 25 U/L (ref 14–54)
ANION GAP: 11 (ref 5–15)
AST: 37 U/L (ref 15–41)
Alkaline Phosphatase: 155 U/L — ABNORMAL HIGH (ref 38–126)
BUN: 37 mg/dL — AB (ref 6–20)
CHLORIDE: 105 mmol/L (ref 101–111)
CO2: 25 mmol/L (ref 22–32)
Calcium: 9.5 mg/dL (ref 8.9–10.3)
Creatinine, Ser: 1.03 mg/dL — ABNORMAL HIGH (ref 0.44–1.00)
GFR calc Af Amer: 57 mL/min — ABNORMAL LOW (ref >60)
GFR calc non Af Amer: 50 mL/min — ABNORMAL LOW (ref >60)
GLUCOSE: 103 mg/dL — AB (ref 65–99)
POTASSIUM: 3.7 mmol/L (ref 3.5–5.1)
Sodium: 141 mmol/L (ref 135–145)
TOTAL PROTEIN: 6.6 g/dL (ref 6.5–8.1)
Total Bilirubin: 1.3 mg/dL — ABNORMAL HIGH (ref 0.3–1.2)

## 2016-01-14 LAB — CBC WITH DIFFERENTIAL/PLATELET
BASOS ABS: 0 10*3/uL (ref 0.0–0.1)
BASOS PCT: 1 %
EOS ABS: 0.1 10*3/uL (ref 0.0–0.7)
EOS PCT: 3 %
HCT: 39 % (ref 36.0–46.0)
Hemoglobin: 13.3 g/dL (ref 12.0–15.0)
Lymphocytes Relative: 24 %
Lymphs Abs: 0.8 10*3/uL (ref 0.7–4.0)
MCH: 34.9 pg — ABNORMAL HIGH (ref 26.0–34.0)
MCHC: 34.1 g/dL (ref 30.0–36.0)
MCV: 102.4 fL — ABNORMAL HIGH (ref 78.0–100.0)
MONO ABS: 0.3 10*3/uL (ref 0.1–1.0)
MONOS PCT: 10 %
NEUTROS ABS: 2.1 10*3/uL (ref 1.7–7.7)
Neutrophils Relative %: 62 %
PLATELETS: 147 10*3/uL — AB (ref 150–400)
RBC: 3.81 MIL/uL — ABNORMAL LOW (ref 3.87–5.11)
RDW: 13.7 % (ref 11.5–15.5)
WBC: 3.4 10*3/uL — ABNORMAL LOW (ref 4.0–10.5)

## 2016-01-15 ENCOUNTER — Ambulatory Visit (HOSPITAL_COMMUNITY): Payer: Medicare Other | Admitting: Hematology & Oncology

## 2016-01-16 ENCOUNTER — Ambulatory Visit (HOSPITAL_COMMUNITY): Payer: Medicare Other | Admitting: Hematology & Oncology

## 2016-01-16 ENCOUNTER — Encounter (HOSPITAL_COMMUNITY): Payer: Self-pay | Admitting: *Deleted

## 2016-01-16 NOTE — Progress Notes (Signed)
This encounter was created in error - please disregard.

## 2016-01-17 ENCOUNTER — Ambulatory Visit (INDEPENDENT_AMBULATORY_CARE_PROVIDER_SITE_OTHER): Payer: Medicare Other | Admitting: Cardiology

## 2016-01-17 ENCOUNTER — Encounter: Payer: Self-pay | Admitting: Cardiology

## 2016-01-17 ENCOUNTER — Ambulatory Visit (INDEPENDENT_AMBULATORY_CARE_PROVIDER_SITE_OTHER): Payer: Medicare Other | Admitting: *Deleted

## 2016-01-17 VITALS — BP 104/56 | HR 66 | Ht 59.0 in | Wt 110.6 lb

## 2016-01-17 DIAGNOSIS — Z7901 Long term (current) use of anticoagulants: Secondary | ICD-10-CM | POA: Diagnosis not present

## 2016-01-17 DIAGNOSIS — I4891 Unspecified atrial fibrillation: Secondary | ICD-10-CM

## 2016-01-17 DIAGNOSIS — I429 Cardiomyopathy, unspecified: Secondary | ICD-10-CM | POA: Diagnosis not present

## 2016-01-17 DIAGNOSIS — I10 Essential (primary) hypertension: Secondary | ICD-10-CM

## 2016-01-17 DIAGNOSIS — I340 Nonrheumatic mitral (valve) insufficiency: Secondary | ICD-10-CM | POA: Diagnosis not present

## 2016-01-17 DIAGNOSIS — Z5181 Encounter for therapeutic drug level monitoring: Secondary | ICD-10-CM

## 2016-01-17 DIAGNOSIS — I480 Paroxysmal atrial fibrillation: Secondary | ICD-10-CM | POA: Diagnosis not present

## 2016-01-17 LAB — POCT INR: INR: 2.7

## 2016-01-17 NOTE — Patient Instructions (Signed)
Your physician wants you to follow-up in: 6 months with Dr Bryna Colander will receive a reminder letter in the mail two months in advance. If you don't receive a letter, please call our office to schedule the follow-up appointment.   STOP Digoxin (Lanoxin)   Your physician has requested that you have an echocardiogram. Echocardiography is a painless test that uses sound waves to create images of your heart. It provides your doctor with information about the size and shape of your heart and how well your heart's chambers and valves are working. This procedure takes approximately one hour. There are no restrictions for this procedure.     If you need a refill on your cardiac medications before your next appointment, please call your pharmacy.   Thank you for choosing [facility] !

## 2016-01-17 NOTE — Progress Notes (Signed)
Patient ID: Alicia Harrington, female   DOB: 1932-11-02, 82 y.o.   MRN: [id number]     Clinical Summary Alicia Harrington is a 82 y.o.female seen today for follow up of the following medical problems.   1. Cardiomyopathy  - possibly chemo induced, secondary to Adriamycin.  - LVEF 40% 12/09, increased to 50% in 01/2009. Recent echo 10/2014 LVEF 40-45%. - still with some SOB on exertion though improved from last visit. Stable DOE at 2 flights.  - working on weight loss, down 8 lbs. Can have some intermittent LE edema that is stable. Taking lasix 20mg  daily.    2. HTN   - compliant with meds   3. Paroxysmal afib  - denies any palpitations. No bleeding troubles on coumadin   4. Breast CA - followed by oncology  5. Mitral regurgitation - mild to moderate by echo 10/2014 - LE edema controlled with lasix. DOE improved since last visit   Past Medical History  Diagnosis Date  . Cardiomyopathy 2009    possibly secondary to Adriamycin; EF 40% in 12/09 and 50% 3/10; pulmonary edema in 2009  . Chronic kidney disease (CKD), stage I     when on diuretic with creatinine of 1.6  . Hypertension   . Adenocarcinoma, breast ([place])     treated with lumpectomy, node dissection, chemo and RT  . Hearing impairment   . DJD (degenerative joint disease)     right knee and lumbosacral spine  . History of angina 2001    normal cornary arteries in 2001  . Macular degeneration, dry     + cataracts  . Chronic anticoagulation 04/16/2011  . Complication of anesthesia     patient vomits with demerol  . PONV (postoperative nausea and vomiting)   . Sick sinus syndrome (HCC)     Paroxysmal to permanent AF; initially first degree AV block also noted  . Neuropathy (HCC)     fingers and toes  . Ataxia   . Retinal macular atrophy   . Breast cancer ([place]) 2009    IDC+DCIS of right breast; triple positive     Allergies  Allergen Reactions  . Altace [Ramipril] Rash     Current Outpatient  Prescriptions  Medication Sig Dispense Refill  . acetaminophen (TYLENOL) 500 MG tablet Take 500 mg by mouth every 6 (six) hours as needed.    Marland Kitchen albuterol (PROVENTIL HFA;VENTOLIN HFA) 108 (90 BASE) MCG/ACT inhaler Inhale 1-2 puffs into the lungs every 6 (six) hours as needed for wheezing or shortness of breath. 1 Inhaler 2  . b complex vitamins tablet Take 1 tablet by mouth daily.    . bifidobacterium infantis (ALIGN) capsule Take 1 capsule by mouth daily. 14 capsule 0  . cholecalciferol (VITAMIN D) 1000 UNITS tablet Take 1,000 Units by mouth daily.      . Cyanocobalamin (VITAMIN B 12 PO) Take 1,000 mcg by mouth daily.    . diclofenac sodium (VOLTAREN) 1 % GEL Apply 1 application topically 4 (four) times daily as needed.    . digoxin (LANOXIN) 0.125 MG tablet Take 1 tablet (0.125 mg total) by mouth daily. 90 tablet 3  . furosemide (LASIX) 20 MG tablet Take 20 mg by mouth daily.    Marland Kitchen latanoprost (XALATAN) 0.005 % ophthalmic solution Place 1 drop into both eyes daily.  3  . metoprolol succinate (TOPROL-XL) 50 MG 24 hr tablet Take 1 tablet (50 mg total) by mouth daily. 90 tablet 3  . Multiple Vitamin (MULITIVITAMIN WITH MINERALS) TABS  Take 1 tablet by mouth daily.    . Multiple Vitamins-Minerals (PRESERVISION AREDS 2 PO) Take 1 capsule by mouth 2 (two) times daily.    . ondansetron (ZOFRAN) 8 MG tablet Take 1 tablet (8 mg total) by mouth every 8 (eight) hours as needed for nausea or vomiting. 30 tablet 0  . potassium chloride SA (KLOR-CON M20) 20 MEQ tablet Take 0.5 tablets (10 mEq total) by mouth daily. For potassium replacement. Only takes when taking Lasix 90 tablet 3  . tamoxifen (NOLVADEX) 10 MG tablet Take 1 tablet (10 mg) daily. 30 tablet 6  . traMADol (ULTRAM) 50 MG tablet Take 50 mg by mouth daily as needed. Only takes if necessary    . valsartan (DIOVAN) 80 MG tablet Take 1 tablet (80 mg total) by mouth daily. 90 tablet 3  . warfarin (COUMADIN) 5 MG tablet Take 1 tablet (5 mg total) by  mouth daily at 6 PM. 90 tablet 3   No current facility-administered medications for this visit.     Past Surgical History  Procedure Laterality Date  . Appendectomy    . Dilation and curettage of uterus    . Middle ear surgery      Left  . Tonsillectomy    . Microdiscectomy lumbar  2009    lumbosacral spine  . Mastectomy partial / lumpectomy w/ axillary lymphadenectomy  2009    Carcinoma of the breast  . Colonoscopy  02/11/2012    Colonoscopy plus polypectomy x2, Rehman  . Port-a-cath removal    . Cataract extraction w/phaco  10/11/2012    Procedure: CATARACT EXTRACTION PHACO AND INTRAOCULAR LENS PLACEMENT (IOC);  Surgeon: Williams Che, MD;  Location: [facility];  Service: Ophthalmology;  Laterality: Right;  CDE:10.82  . Esophagogastroduodenoscopy N/A 01/19/2015    Procedure: ESOPHAGOGASTRODUODENOSCOPY (EGD);  Surgeon: Rogene Houston, MD;  Location: [facility];  Service: Endoscopy;  Laterality: N/A;  910  . Esophageal dilation N/A 01/19/2015    Procedure: ESOPHAGEAL DILATION;  Surgeon: Rogene Houston, MD;  Location: [facility];  Service: Endoscopy;  Laterality: N/A;     Allergies  Allergen Reactions  . Altace [Ramipril] Rash      Family History  Problem Relation Age of Onset  . Heart failure Mother   . Bladder Cancer Maternal Uncle     smoker  . Ovarian cancer Maternal Grandmother     dx. 98s; +surgery  . Stroke Brother   . Breast cancer Maternal Aunt     dx. late 92s  . Breast cancer Cousin 73  . Prostate cancer Cousin 87  . Cancer Cousin     unspecified type  . Stroke Paternal Uncle      Social History Alicia Harrington reports that she quit smoking about 60 years ago. Her smoking use included Cigarettes. She started smoking about 61 years ago. She has never used smokeless tobacco. Alicia Harrington reports that she drinks alcohol.   Review of Systems CONSTITUTIONAL: No weight loss, fever, chills, weakness or fatigue.  HEENT: Eyes: No visual loss, blurred  vision, double vision or yellow sclerae.No hearing loss, sneezing, congestion, runny nose or sore throat.  SKIN: No rash or itching.  CARDIOVASCULAR: per hpi RESPIRATORY: No cough or sputum.  GASTROINTESTINAL: No anorexia, nausea, vomiting or diarrhea. No abdominal pain or blood.  GENITOURINARY: No burning on urination, no polyuria NEUROLOGICAL: No headache, dizziness, syncope, paralysis, ataxia, numbness or tingling in the extremities. No change in bowel or bladder control.  MUSCULOSKELETAL: No muscle, back  pain, joint pain or stiffness.  LYMPHATICS: No enlarged nodes. No history of splenectomy.  PSYCHIATRIC: No history of depression or anxiety.  ENDOCRINOLOGIC: No reports of sweating, cold or heat intolerance. No polyuria or polydipsia.  Marland Kitchen   Physical Examination Filed Vitals:   01/17/16 0819  BP: 104/56  Pulse: 66   Filed Vitals:   01/17/16 0819  Height: 4\' 11"  (1.499 m)  Weight: 110 lb 9.6 oz (50.168 kg)    Gen: resting comfortably, no acute distress HEENT: no scleral icterus, pupils equal round and reactive, no palptable cervical adenopathy,  CV: RRR, 2/6 systolic murmur apex, no jvd Resp: Clear to auscultation bilaterally GI: abdomen is soft, non-tender, non-distended, normal bowel sounds, no hepatosplenomegaly MSK: extremities are warm, no edema.  Skin: warm, no rash Neuro:  no focal deficits Psych: appropriate affect   Diagnostic Studies 12/2013 Echo LVEF 45-50%, elevated LA pressure, mild AI, moderate MR, mod TR, PASP 46  10/2014 Echo Study Conclusions  - Left ventricle: The cavity size was normal. Wall thickness was normal. Systolic function was mildly to moderately reduced. The estimated ejection fraction was in the range of 40% to 45%. - Aortic valve: There was mild regurgitation. Regurgitation pressure half-time: 794 ms. - Mitral valve: Mildly calcified annulus. There was mild to moderate regurgitation. MR vena contracta is 0.4 cm. - Left atrium:  The atrium was severely dilated. - Right ventricle: The interventricular septum is flattened in systole and diastole, consistent with RV pressure and volume overload. The cavity size was mildly dilated. Systolic function was low normal. RV TAPSE is 1.6 cm. - Right atrium: The atrium was severely dilated. - Atrial septum: No defect or patent foramen ovale was identified. - Pulmonary arteries: Systolic pressure was moderately increased. PA peak pressure: 60 mm Hg (S). - Technically adequate study.    Assessment and Plan  1. Chronic systolic HF - previous history of LV systolic dysfunction thought to be related to Adriamycin - last echo 10/2014 LVEF 40-45% - appears euvolemic today. We will continue current lasix  2. HTN  - at goal, we will continue current meds   3. Paroxysmal afib  - no recent symptoms - d/c digoxin, follow rates on beta blocker alone - continue coumadin for stroke prevention  4. Mitral regurgitation - echo 10/2014  mild to moderate -we will repeat echo   F/u 6 months      Arnoldo Lenis, M.D.

## 2016-01-21 DIAGNOSIS — Z6822 Body mass index (BMI) 22.0-22.9, adult: Secondary | ICD-10-CM | POA: Diagnosis not present

## 2016-01-21 DIAGNOSIS — I5022 Chronic systolic (congestive) heart failure: Secondary | ICD-10-CM | POA: Diagnosis not present

## 2016-01-21 DIAGNOSIS — I4891 Unspecified atrial fibrillation: Secondary | ICD-10-CM | POA: Diagnosis not present

## 2016-01-24 ENCOUNTER — Ambulatory Visit (HOSPITAL_COMMUNITY)
Admission: RE | Admit: 2016-01-24 | Discharge: 2016-01-24 | Disposition: A | Discharge status: Home / Self Care | Payer: Medicare Other | Source: Ambulatory Visit | Attending: Cardiology | Admitting: Cardiology

## 2016-01-24 DIAGNOSIS — I119 Hypertensive heart disease without heart failure: Secondary | ICD-10-CM | POA: Diagnosis not present

## 2016-01-24 DIAGNOSIS — I351 Nonrheumatic aortic (valve) insufficiency: Secondary | ICD-10-CM | POA: Diagnosis not present

## 2016-01-24 DIAGNOSIS — I340 Nonrheumatic mitral (valve) insufficiency: Secondary | ICD-10-CM | POA: Diagnosis not present

## 2016-02-05 DIAGNOSIS — Z853 Personal history of malignant neoplasm of breast: Secondary | ICD-10-CM | POA: Diagnosis not present

## 2016-02-06 ENCOUNTER — Encounter (INDEPENDENT_AMBULATORY_CARE_PROVIDER_SITE_OTHER): Payer: Self-pay | Admitting: Internal Medicine

## 2016-02-06 DIAGNOSIS — H2512 Age-related nuclear cataract, left eye: Secondary | ICD-10-CM | POA: Diagnosis not present

## 2016-02-06 DIAGNOSIS — H401132 Primary open-angle glaucoma, bilateral, moderate stage: Secondary | ICD-10-CM | POA: Diagnosis not present

## 2016-02-06 DIAGNOSIS — Z961 Presence of intraocular lens: Secondary | ICD-10-CM | POA: Diagnosis not present

## 2016-02-06 DIAGNOSIS — H04123 Dry eye syndrome of bilateral lacrimal glands: Secondary | ICD-10-CM | POA: Diagnosis not present

## 2016-02-08 ENCOUNTER — Encounter (HOSPITAL_COMMUNITY): Payer: Self-pay | Admitting: Hematology & Oncology

## 2016-02-08 ENCOUNTER — Encounter (HOSPITAL_COMMUNITY): Payer: Medicare Other | Attending: Hematology & Oncology | Admitting: Hematology & Oncology

## 2016-02-08 VITALS — BP 113/72 | HR 90 | Temp 97.4°F | Resp 18 | Wt 113.0 lb

## 2016-02-08 DIAGNOSIS — C50911 Malignant neoplasm of unspecified site of right female breast: Secondary | ICD-10-CM | POA: Insufficient documentation

## 2016-02-08 DIAGNOSIS — M8580 Other specified disorders of bone density and structure, unspecified site: Secondary | ICD-10-CM | POA: Diagnosis not present

## 2016-02-08 DIAGNOSIS — D696 Thrombocytopenia, unspecified: Secondary | ICD-10-CM

## 2016-02-08 DIAGNOSIS — D7589 Other specified diseases of blood and blood-forming organs: Secondary | ICD-10-CM

## 2016-02-08 DIAGNOSIS — Z853 Personal history of malignant neoplasm of breast: Secondary | ICD-10-CM | POA: Diagnosis not present

## 2016-02-08 DIAGNOSIS — Z1379 Encounter for other screening for genetic and chromosomal anomalies: Secondary | ICD-10-CM

## 2016-02-08 NOTE — Patient Instructions (Addendum)
[place] at [facility] Discharge Instructions  RECOMMENDATIONS MADE BY THE CONSULTANT AND ANY TEST RESULTS WILL BE SENT TO YOUR REFERRING PHYSICIAN.   Exam and discussion by Dr Whitney Muse today Clinical breast exam today-that was good  Return to see the doctor in 6 months Please call the clinic if you have any questions or concerns    Thank you for choosing [place] at [facility] to provide your oncology and hematology care.  To afford each patient quality time with our provider, please arrive at least 15 minutes before your scheduled appointment time.   Beginning January 23rd 2017 lab work for the [facility] will be done in the  Main lab at [facility] on 1st floor. If you have a lab appointment with the [facility] please come in thru the  Main Entrance and check in at the main information desk  You need to re-schedule your appointment should you arrive 10 or more minutes late.  We strive to give you quality time with our providers, and arriving late affects you and other patients whose appointments are after yours.  Also, if you no show three or more times for appointments you may be dismissed from the clinic at the providers discretion.     Again, thank you for choosing [facility].  Our hope is that these requests will decrease the amount of time that you wait before being seen by our physicians.       _____________________________________________________________  Should you have questions after your visit to [facility], please contact our office at [phone] between the hours of 8:30 a.m. and 4:30 p.m.  Voicemails left after 4:30 p.m. will not be returned until the following business day.  For prescription refill requests, have your pharmacy contact our office.         Resources For Cancer Patients and their Caregivers ? American Cancer Society: Can assist with transportation, wigs, general  needs, runs Look Good Feel Better.        ([phone] ? Cancer Care: Provides financial assistance, online support groups, medication/co-pay assistance.  1-800-813-HOPE 628-187-4895) ? [facility] Assists [place] cancer patients and their families through emotional , educational and financial support.  6816019199 ? [place] DSS Where to apply for food stamps, Medicaid and utility assistance. 9023732334 ? [facility]: Transportation to medical appointments. 5076809408 ? Social Security Administration: May apply for disability if have a Stage IV cancer. 367-131-1904 [phone] 041-3303 ? [employer], Disability and Transit Services: Assists with nutrition, care and transit needs. [phone]

## 2016-02-10 NOTE — Progress Notes (Signed)
Alicia Harrington, [place] [address]    DIAGNOSIS:  Stage II R breast cancer Treated under NSABP 41 protocol. Could not tolerate Taxol plus trastuzumab secondary to heart failure June 2010 to August 2014 Tamoxifen August 2014 to 12/30/2013 Arimidex Tamoxifen 10 daily on 12/30/2013 by Dr. Stephenie Acres DEXA on 09/2013 with osteopenia Breast Cancer INDEX with score of 7.2, 9.4% risk of distant recurrance with a low likelihood of benefit from extended endocrine therapy   CURRENT THERAPY: Observation  INTERVAL HISTORY: Alicia Harrington 82 y.o. female returns for follow-up of a stage II breast cancer of the R breast.    Alicia Harrington is here today with her husband.   She remarks that she is doing well. They are planning on traveling to [facility] very soon. She has just been seen by her opthalmologist and dentist. She has just had mammographic screening at [facility] in [place].  She denies changes in appetite or energy level. She notes that she has seen Dr. Harl Bowie and is up to date on her well care.  Today she has no breast complaints or concerns.   MEDICAL HISTORY: Past Medical History  Diagnosis Date  . Cardiomyopathy 2009    possibly secondary to Adriamycin; EF 40% in 12/09 and 50% 3/10; pulmonary edema in 2009  . Chronic kidney disease (CKD), stage I     when on diuretic with creatinine of 1.6  . Hypertension   . Adenocarcinoma, breast ([place])     treated with lumpectomy, node dissection, chemo and RT  . Hearing impairment   . DJD (degenerative joint disease)     right knee and lumbosacral spine  . History of angina 2001    normal cornary arteries in 2001  . Macular degeneration, dry     + cataracts  . Chronic anticoagulation 04/16/2011  . Complication of anesthesia     patient vomits with demerol  . PONV (postoperative nausea and vomiting)   . Sick sinus syndrome (HCC)     Paroxysmal to permanent AF; initially first degree AV block also noted  .  Neuropathy (HCC)     fingers and toes  . Ataxia   . Retinal macular atrophy   . Breast cancer ([place]) 2009    IDC+DCIS of right breast; triple positive    has HEARING IMPAIRMENT; HYPERTENSION; Atrial fibrillation ([place]); Chronic anticoagulation; Cardiomyopathy ([place]); Sick sinus syndrome ([place]); Chronic kidney disease (CKD), stage I; Adenocarcinoma, breast ([place]); DJD (degenerative joint disease); History of angina; Macular degeneration, dry; Fasting hyperglycemia; Verruca vulgaris; Encounter for therapeutic drug monitoring; Mitral regurgitation; Abdominal pain; History of breast cancer in female; Family history of breast cancer in female; and Genetic testing on her problem list.     is allergic to altace.  [name] does not currently have medications on file.  SURGICAL HISTORY: Past Surgical History  Procedure Laterality Date  . Appendectomy    . Dilation and curettage of uterus    . Middle ear surgery      Left  . Tonsillectomy    . Microdiscectomy lumbar  2009    lumbosacral spine  . Mastectomy partial / lumpectomy w/ axillary lymphadenectomy  2009    Carcinoma of the breast  . Colonoscopy  02/11/2012    Colonoscopy plus polypectomy x2, Rehman  . Port-a-cath removal    . Cataract extraction w/phaco  10/11/2012    Procedure: CATARACT EXTRACTION PHACO AND INTRAOCULAR LENS PLACEMENT (IOC);  Surgeon: Williams Che, MD;  Location: [facility];  Service: Ophthalmology;  Laterality: Right;  CDE:10.82  . Esophagogastroduodenoscopy N/A 01/19/2015    Procedure: ESOPHAGOGASTRODUODENOSCOPY (EGD);  Surgeon: Rogene Houston, MD;  Location: [facility];  Service: Endoscopy;  Laterality: N/A;  910  . Esophageal dilation N/A 01/19/2015    Procedure: ESOPHAGEAL DILATION;  Surgeon: Rogene Houston, MD;  Location: [facility];  Service: Endoscopy;  Laterality: N/A;    SOCIAL HISTORY: Social History   Social History  . Marital Status: Married    Spouse Name: N/A  . Number of Children: N/A  .  Years of Education: N/A   Occupational History  .      retired   Social History Main Topics  . Smoking status: Former Smoker    Types: Cigarettes    Start date: 11/24/1954    Quit date: 12/23/1955  . Smokeless tobacco: Never Used     Comment: quit at age 14; 0.5 pack per wk for one year  . Alcohol Use: 0.0 oz/week    0 Standard drinks or equivalent per week     Comment: one glass of wine per day  . Drug Use: No  . Sexual Activity: Not on file   Other Topics Concern  . Not on file   Social History Narrative   Pt lives in [place] with spouse.  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    FAMILY HISTORY: Family History  Problem Relation Age of Onset  . Heart failure Mother   . Bladder Cancer Maternal Uncle     smoker  . Ovarian cancer Maternal Grandmother     dx. 58s; +surgery  . Stroke Brother   . Breast cancer Maternal Aunt     dx. late 68s  . Breast cancer Cousin 1  . Prostate cancer Cousin 26  . Cancer Cousin     unspecified type  . Stroke Paternal Uncle     Review of Systems  Constitutional: Positive for malaise/fatigue.  HENT: Negative.   Eyes: Negative.   Respiratory: Negative.   Cardiovascular: Negative.   Gastrointestinal: Negative.   Genitourinary: Negative.   Musculoskeletal: Positive for joint pain.  Skin: Negative.   Neurological: Negative.   Endo/Heme/Allergies: Negative.   Psychiatric/Behavioral: Negative.    14 point review of systems was performed and is negative except as detailed under history of present illness and above   PHYSICAL EXAMINATION  ECOG PERFORMANCE STATUS: 1 - Symptomatic but completely ambulatory  Filed Vitals:   02/08/16 1000  BP: 113/72  Pulse: 90  Temp: 97.4 F (36.3 C)  Resp: 18    Physical Exam  Constitutional: She is oriented to person, place, and time and well-developed, well-nourished, and in no distress.  HENT:  Head: Normocephalic and atraumatic.  Nose: Nose normal.  Mouth/Throat:  Oropharynx is clear and moist. No oropharyngeal exudate.  Eyes: Conjunctivae and EOM are normal. Pupils are equal, round, and reactive to light. Right eye exhibits no discharge. Left eye exhibits no discharge. No scleral icterus.  Neck: Normal range of motion. Neck supple. No tracheal deviation present. No thyromegaly present.  Cardiovascular: Normal rate, regular rhythm and normal heart sounds.  Exam reveals no gallop and no friction rub.   No murmur heard. Pulmonary/Chest: Effort normal and breath sounds normal. She has no wheezes. She has no rales.  Bilateral breast exam performed and unremarkable for palpable abnormalities, no nipple changes and no skin retraction or dimpling. Bilateral axillae are clear  Abdominal: Soft. Bowel sounds are normal. She exhibits  no distension and no mass. There is no tenderness. There is no rebound and no guarding.  Musculoskeletal: Normal range of motion. She exhibits no edema.  Lymphadenopathy:    She has no cervical adenopathy.  Neurological: She is alert and oriented to person, place, and time. She has normal reflexes. No cranial nerve deficit. Gait normal. Coordination normal.  Skin: Skin is warm and dry. No rash noted.  Psychiatric: Mood, memory, affect and judgment normal.  Nursing note and vitals reviewed.   LABORATORY DATA:  I have reviewed the labs below.  CBC    Component Value Date/Time   WBC 3.4* 01/14/2016 1239   RBC 3.81* 01/14/2016 1239   HGB 13.3 01/14/2016 1239   HCT 39.0 01/14/2016 1239   PLT 147* 01/14/2016 1239   MCV 102.4* 01/14/2016 1239   MCH 34.9* 01/14/2016 1239   MCHC 34.1 01/14/2016 1239   RDW 13.7 01/14/2016 1239   LYMPHSABS 0.8 01/14/2016 1239   MONOABS 0.3 01/14/2016 1239   EOSABS 0.1 01/14/2016 1239   BASOSABS 0.0 01/14/2016 1239   CMP     Component Value Date/Time   NA 141 01/14/2016 1239   K 3.7 01/14/2016 1239   CL 105 01/14/2016 1239   CO2 25 01/14/2016 1239   GLUCOSE 103* 01/14/2016 1239   BUN 37*  01/14/2016 1239   CREATININE 1.03* 01/14/2016 1239   CREATININE 0.98 04/07/2013 1154   CALCIUM 9.5 01/14/2016 1239   PROT 6.6 01/14/2016 1239   ALBUMIN 4.1 01/14/2016 1239   AST 37 01/14/2016 1239   ALT 25 01/14/2016 1239   ALKPHOS 155* 01/14/2016 1239   BILITOT 1.3* 01/14/2016 1239   GFRNONAA 50* 01/14/2016 1239   GFRAA 57* 01/14/2016 1239     ASSESSMENT and THERAPY PLAN:  Stage II R breast cancer Treated under NSABP 41 protocol. Could not tolerate Taxol plus trastuzumab secondary to heart failure Osteopenia BCI predictive with low likelihood of benefit from extended adjuvant therapy Macrocytosis with normal B12 and folate Thrombocytopenia Mild hyperbilirubinemia, intermittent  Her breast exam today was unremarkable. She has completed all recommended therapy for her prior right breast cancer. She is up to date on mammography, she obtains these at [facility] in [place].   She has a macrocytosis which I suspect may be posttreatment related but we will continue with ongoing follow-up.She has mild thrombocytopenia which is stable. She has intermittent hyperbilirubinemia. She does drink wine every evening. She has cardiomyopathy and paroxysmal A. fib. She has no known history of liver dysfunction or disease.  She would like to continue with 6 month follow-up visits. We will see her back in the fall.   All questions were answered. The patient knows to call the clinic with any problems, questions or concerns. We can certainly see the patient much sooner if necessary.  This note was signed electronically Molli Hazard, MD

## 2016-02-18 ENCOUNTER — Ambulatory Visit (INDEPENDENT_AMBULATORY_CARE_PROVIDER_SITE_OTHER): Payer: Medicare Other | Admitting: *Deleted

## 2016-02-18 DIAGNOSIS — Z7901 Long term (current) use of anticoagulants: Secondary | ICD-10-CM

## 2016-02-18 DIAGNOSIS — Z5181 Encounter for therapeutic drug level monitoring: Secondary | ICD-10-CM | POA: Diagnosis not present

## 2016-02-18 DIAGNOSIS — I4891 Unspecified atrial fibrillation: Secondary | ICD-10-CM | POA: Diagnosis not present

## 2016-02-18 LAB — POCT INR: INR: 2

## 2016-02-19 ENCOUNTER — Ambulatory Visit (INDEPENDENT_AMBULATORY_CARE_PROVIDER_SITE_OTHER): Payer: Medicare Other | Admitting: Internal Medicine

## 2016-02-19 ENCOUNTER — Encounter (INDEPENDENT_AMBULATORY_CARE_PROVIDER_SITE_OTHER): Payer: Self-pay | Admitting: Internal Medicine

## 2016-02-19 VITALS — BP 98/70 | HR 66 | Temp 97.8°F | Resp 18 | Ht 60.0 in | Wt 113.7 lb

## 2016-02-19 DIAGNOSIS — K589 Irritable bowel syndrome without diarrhea: Secondary | ICD-10-CM | POA: Diagnosis not present

## 2016-02-19 DIAGNOSIS — R1314 Dysphagia, pharyngoesophageal phase: Secondary | ICD-10-CM | POA: Diagnosis not present

## 2016-02-19 DIAGNOSIS — R1310 Dysphagia, unspecified: Secondary | ICD-10-CM

## 2016-02-19 DIAGNOSIS — R1319 Other dysphagia: Secondary | ICD-10-CM

## 2016-02-19 NOTE — Patient Instructions (Signed)
IBgard 1 capsule by mouth 30 minutes before onset and evening meal daily. If no response noted in 2 weeks will consider Xifaxan.

## 2016-02-19 NOTE — Progress Notes (Signed)
Presenting complaint;  Follow-up for dysphagia and bowel problems.  Subjective:  Alicia Harrington is a 82 year old Caucasian female who is here for scheduled visit. She was last seen in February 2016 for dysphagia. She underwent EGD with ED on 01/19/2015 and it provided relief of her dysphagia for to 3 weeks. She continues have intermittent dysphagia. She is watching her diet closely. She has most difficulty with solids and meat. Food bolus eventually goes down. She has not had any episode of food impaction. She denies heartburn nausea or vomiting. She also complains of intermittent urgency with diarrhea and bloating. Sometimes she has he symptoms while she is eating her meal and at other times it occurs within few minutes. Alicia Harrington occurs once or twice a week and usually after lunch or evening meal. She denies melena or rectal bleeding. She has taken Levsin in the past and it helped however she developed nausea and heaving after taking Levsin she therefore stopped this medication. Her appetite is good. Her weight has been stable. She denies nocturnal diarrhea melena or rectal bleeding.  Current Medications: Outpatient Encounter Prescriptions as of 02/19/2016  Medication Sig  . acetaminophen (TYLENOL) 500 MG tablet Take 500 mg by mouth every 6 (six) hours as needed.  Marland Kitchen albuterol (PROVENTIL HFA;VENTOLIN HFA) 108 (90 BASE) MCG/ACT inhaler Inhale 1-2 puffs into the lungs every 6 (six) hours as needed for wheezing or shortness of breath.  Marland Kitchen b complex vitamins tablet Take 1 tablet by mouth daily.  . cholecalciferol (VITAMIN D) 1000 UNITS tablet Take 1,000 Units by mouth daily.    . Cyanocobalamin (VITAMIN B 12 PO) Take 1,000 mcg by mouth daily.  Marland Kitchen latanoprost (XALATAN) 0.005 % ophthalmic solution Place 1 drop into both eyes daily.  . metoprolol succinate (TOPROL-XL) 50 MG 24 hr tablet Take 1 tablet (50 mg total) by mouth daily.  . Multiple Vitamin (MULITIVITAMIN WITH MINERALS) TABS Take 1 tablet by mouth daily.   . ondansetron (ZOFRAN) 8 MG tablet Take 1 tablet (8 mg total) by mouth every 8 (eight) hours as needed for nausea or vomiting.  . potassium chloride SA (KLOR-CON M20) 20 MEQ tablet Take 0.5 tablets (10 mEq total) by mouth daily. For potassium replacement. Only takes when taking Lasix  . traMADol (ULTRAM) 50 MG tablet Take 50 mg by mouth daily as needed. Only takes if necessary  . valsartan (DIOVAN) 80 MG tablet Take 1 tablet (80 mg total) by mouth daily.  Marland Kitchen warfarin (COUMADIN) 5 MG tablet Take 5 mg by mouth.   . furosemide (LASIX) 20 MG tablet Take 20 mg by mouth daily. Reported on 02/19/2016  . [DISCONTINUED] apixaban (ELIQUIS) 5 MG TABS tablet Take 5 mg by mouth. Reported on 02/19/2016  . [DISCONTINUED] diclofenac sodium (VOLTAREN) 1 % GEL Apply 1 application topically 4 (four) times daily as needed. Reported on 02/19/2016  . [DISCONTINUED] warfarin (COUMADIN) 5 MG tablet Take 1 tablet (5 mg total) by mouth daily at 6 PM. (Patient not taking: Reported on 02/19/2016)   No facility-administered encounter medications on file as of 02/19/2016.    Objective: Blood pressure 98/70, pulse 66, temperature 97.8 F (36.6 C), temperature source Oral, resp. rate 18, height 5' (1.524 m), weight 113 lb 11.2 oz (51.574 kg). Patient is alert and in no acute distress. Conjunctiva is pink. Sclera is nonicteric Oropharyngeal mucosa is normal. No neck masses or thyromegaly noted. Cardiac exam with regular rhythm normal S1 and S2. No murmur or gallop noted. Lungs are clear to auscultation. Abdomen is symmetrical soft  and nontender without organomegaly or masses. No LE edema or clubbing noted.   Assessment:  #1. Esophageal dysphagia. No structural abnormality noted to esophagus on EGD of 01/19/2015. Esophageal dilation provided relief for few weeks. Suspect she has esophageal motility disorder. Need for further evaluation will depend on clinical course. #2. Irritable bowel syndrome with intermittent urgency and  diarrhea with bloating. Hyoscyamine helped but had to be stopped because of side effects. If she does not respond to OTC medication will consider short course of Xifaxan.   Plan:  Ibgard 1 capsule by mouth twice a day. Patient will call with progress report in 2 weeks. Will consider Xifaxan if no benefit experience with Ibgard. Patient will call if dysphagia worsens in which case we'll proceed with esophageal manometry and impedance study. Office visit in 6 months.

## 2016-02-26 ENCOUNTER — Ambulatory Visit (INDEPENDENT_AMBULATORY_CARE_PROVIDER_SITE_OTHER): Payer: Medicare Other | Admitting: Internal Medicine

## 2016-03-31 ENCOUNTER — Telehealth (INDEPENDENT_AMBULATORY_CARE_PROVIDER_SITE_OTHER): Payer: Self-pay | Admitting: Internal Medicine

## 2016-03-31 ENCOUNTER — Ambulatory Visit (INDEPENDENT_AMBULATORY_CARE_PROVIDER_SITE_OTHER): Payer: Medicare Other | Admitting: *Deleted

## 2016-03-31 DIAGNOSIS — Z7901 Long term (current) use of anticoagulants: Secondary | ICD-10-CM | POA: Diagnosis not present

## 2016-03-31 DIAGNOSIS — Z5181 Encounter for therapeutic drug level monitoring: Secondary | ICD-10-CM | POA: Diagnosis not present

## 2016-03-31 DIAGNOSIS — I4891 Unspecified atrial fibrillation: Secondary | ICD-10-CM

## 2016-03-31 LAB — POCT INR: INR: 2.2

## 2016-03-31 NOTE — Telephone Encounter (Signed)
Per Dr.Rehman - Patient may continue the IB gard. Especially if it is working, he doesn't see any down sides to it. Patient my take daily or intermittently on the days that she is going out. Patient was called and a message was given with Dr.Rehman's recommendation.  We do not have any samples at this time.

## 2016-03-31 NOTE — Telephone Encounter (Signed)
Patient called and stated that she has questions about her medication.  She stated that she has finished the Ibgard, which she said worked well.  She is now out of this.  She wants to know if she needs to continue this or stop it.  She stated that she'll be in and out all day, but to please call her.  [phone]

## 2016-04-07 ENCOUNTER — Other Ambulatory Visit (HOSPITAL_COMMUNITY): Payer: Self-pay | Admitting: Hematology & Oncology

## 2016-04-23 ENCOUNTER — Telehealth: Payer: Self-pay | Admitting: *Deleted

## 2016-04-23 DIAGNOSIS — Z5181 Encounter for therapeutic drug level monitoring: Secondary | ICD-10-CM

## 2016-04-23 DIAGNOSIS — I4891 Unspecified atrial fibrillation: Secondary | ICD-10-CM

## 2016-04-23 NOTE — Telephone Encounter (Signed)
Pt needs lab orders to get PT/INR drawn while in [facility].  Orders printed out and let at front desk for pt to pick up.

## 2016-04-23 NOTE — Telephone Encounter (Signed)
Pt would like you to give her a call

## 2016-05-01 ENCOUNTER — Ambulatory Visit (INDEPENDENT_AMBULATORY_CARE_PROVIDER_SITE_OTHER): Payer: Medicare Other | Admitting: *Deleted

## 2016-05-01 DIAGNOSIS — I4891 Unspecified atrial fibrillation: Secondary | ICD-10-CM

## 2016-05-01 DIAGNOSIS — Z5181 Encounter for therapeutic drug level monitoring: Secondary | ICD-10-CM | POA: Diagnosis not present

## 2016-05-01 LAB — PROTIME-INR: INR: 3.2 — AB (ref 0.9–1.1)

## 2016-05-22 ENCOUNTER — Telehealth: Payer: Self-pay | Admitting: *Deleted

## 2016-05-22 NOTE — Telephone Encounter (Signed)
Called pt.  Appt made for 05/26/16

## 2016-05-22 NOTE — Telephone Encounter (Signed)
Patient states that you need to call her regarding an appointment. / tg

## 2016-05-26 ENCOUNTER — Ambulatory Visit (INDEPENDENT_AMBULATORY_CARE_PROVIDER_SITE_OTHER): Payer: Medicare Other | Admitting: *Deleted

## 2016-05-26 DIAGNOSIS — I4891 Unspecified atrial fibrillation: Secondary | ICD-10-CM | POA: Diagnosis not present

## 2016-05-26 DIAGNOSIS — Z5181 Encounter for therapeutic drug level monitoring: Secondary | ICD-10-CM

## 2016-05-26 DIAGNOSIS — Z7901 Long term (current) use of anticoagulants: Secondary | ICD-10-CM

## 2016-05-26 LAB — POCT INR: INR: 3.4

## 2016-05-29 ENCOUNTER — Ambulatory Visit (INDEPENDENT_AMBULATORY_CARE_PROVIDER_SITE_OTHER): Payer: Medicare Other | Admitting: Cardiology

## 2016-05-29 ENCOUNTER — Encounter: Payer: Self-pay | Admitting: Cardiology

## 2016-05-29 VITALS — BP 92/60 | HR 52 | Wt 116.6 lb

## 2016-05-29 DIAGNOSIS — I10 Essential (primary) hypertension: Secondary | ICD-10-CM | POA: Diagnosis not present

## 2016-05-29 DIAGNOSIS — R0602 Shortness of breath: Secondary | ICD-10-CM

## 2016-05-29 DIAGNOSIS — I5022 Chronic systolic (congestive) heart failure: Secondary | ICD-10-CM

## 2016-05-29 DIAGNOSIS — I340 Nonrheumatic mitral (valve) insufficiency: Secondary | ICD-10-CM

## 2016-05-29 DIAGNOSIS — I4891 Unspecified atrial fibrillation: Secondary | ICD-10-CM | POA: Diagnosis not present

## 2016-05-29 DIAGNOSIS — I509 Heart failure, unspecified: Secondary | ICD-10-CM | POA: Diagnosis not present

## 2016-05-29 DIAGNOSIS — Z79899 Other long term (current) drug therapy: Secondary | ICD-10-CM | POA: Diagnosis not present

## 2016-05-29 NOTE — Patient Instructions (Signed)
Medication Instructions:  Your physician recommends that you continue on your current medications as directed. Please refer to the Current Medication list given to you today.   Labwork: none  Testing/Procedures: Your physician has recommended that you have a pulmonary function test. Pulmonary Function Tests are a group of tests that measure how well air moves in and out of your lungs.    Follow-Up: Your physician recommends that you schedule a follow-up appointment in: 3 months with Dr. Harl Bowie.    Any Other Special Instructions Will Be Listed Below (If Applicable).     If you need a refill on your cardiac medications before your next appointment, please call your pharmacy.

## 2016-05-29 NOTE — Progress Notes (Addendum)
Clinical Summary Alicia Harrington is a 82 y.o.female seen today for follow up of the following medical problems.   1. Chronic systolic heart failure - possibly chemo induced, secondary to Adriamycin.  - echo 01/2016 LVEF 40-45%, mild to moderate AI, mild to moderate MR, PASP 42  - recent increased SOB x 3 weeks. Has been somewhat up and down.No chest pain. Occasional palpitations at times but not overally frequent. Has had some recent LE edema. She increased lasix 40mg  daily with some improvement Mild orthopnea - weights at home stable, 112-115 lbs. Can have some wheezing at times.  - previous radiation treatments, previous CXRs have shown some scarring and COPD changes   2. HTN   - compliant with meds   3. Paroxysmal afib  -mild infrequent palpitations.  No bleeding troubles on coumadin   4. Breast CA - followed by oncology  5. Mitral regurgitation - mild to moderate by echo - LE edema controlled with lasix.    Past Medical History  Diagnosis Date  . Cardiomyopathy 2009    possibly secondary to Adriamycin; EF 40% in 12/09 and 50% 3/10; pulmonary edema in 2009  . Chronic kidney disease (CKD), stage I     when on diuretic with creatinine of 1.6  . Hypertension   . Adenocarcinoma, breast ([place])     treated with lumpectomy, node dissection, chemo and RT  . Hearing impairment   . DJD (degenerative joint disease)     right knee and lumbosacral spine  . History of angina 2001    normal cornary arteries in 2001  . Macular degeneration, dry     + cataracts  . Chronic anticoagulation 04/16/2011  . Complication of anesthesia     patient vomits with demerol  . PONV (postoperative nausea and vomiting)   . Sick sinus syndrome (H[facility])     Paroxysmal to permanent AF; initially first degree AV block also noted  . Neuropathy ([facility])     fingers and toes  . Ataxia   . Retinal macular atrophy   . Breast cancer ([place]) 2009    IDC+DCIS of right breast; triple positive      Allergies  Allergen Reactions  . Altace [Ramipril] Rash     Current Outpatient Prescriptions  Medication Sig Dispense Refill  . acetaminophen (TYLENOL) 500 MG tablet Take 500 mg by mouth every 6 (six) hours as needed.    Marland Kitchen albuterol (PROVENTIL HFA;VENTOLIN HFA) 108 (90 BASE) MCG/ACT inhaler Inhale 1-2 puffs into the lungs every 6 (six) hours as needed for wheezing or shortness of breath. 1 Inhaler 2  . b complex vitamins tablet Take 1 tablet by mouth daily.    . cholecalciferol (VITAMIN D) 1000 UNITS tablet Take 1,000 Units by mouth daily.      . Cyanocobalamin (VITAMIN B 12 PO) Take 1,000 mcg by mouth daily.    . furosemide (LASIX) 20 MG tablet Take 20 mg by mouth daily. Reported on 02/19/2016    . latanoprost (XALATAN) 0.005 % ophthalmic solution Place 1 drop into both eyes daily.  3  . metoprolol succinate (TOPROL-XL) 50 MG 24 hr tablet Take 1 tablet (50 mg total) by mouth daily. 90 tablet 3  . Multiple Vitamin (MULITIVITAMIN WITH MINERALS) TABS Take 1 tablet by mouth daily.    . ondansetron (ZOFRAN) 8 MG tablet Take 1 tablet (8 mg total) by mouth every 8 (eight) hours as needed for nausea or vomiting. 30 tablet 0  . potassium chloride SA (KLOR-CON M20)  20 MEQ tablet Take 0.5 tablets (10 mEq total) by mouth daily. For potassium replacement. Only takes when taking Lasix 90 tablet 3  . traMADol (ULTRAM) 50 MG tablet Take 50 mg by mouth daily as needed. Only takes if necessary    . valsartan (DIOVAN) 80 MG tablet Take 1 tablet (80 mg total) by mouth daily. 90 tablet 3  . warfarin (COUMADIN) 5 MG tablet Take 5 mg by mouth.      No current facility-administered medications for this visit.     Past Surgical History  Procedure Laterality Date  . Appendectomy    . Dilation and curettage of uterus    . Middle ear surgery      Left  . Tonsillectomy    . Microdiscectomy lumbar  2009    lumbosacral spine  . Mastectomy partial / lumpectomy w/ axillary lymphadenectomy  2009     Carcinoma of the breast  . Colonoscopy  02/11/2012    Colonoscopy plus polypectomy x2, Rehman  . Port-a-cath removal    . Cataract extraction w/phaco  10/11/2012    Procedure: CATARACT EXTRACTION PHACO AND INTRAOCULAR LENS PLACEMENT (IOC);  Surgeon: Williams Che, MD;  Location: [facility];  Service: Ophthalmology;  Laterality: Right;  CDE:10.82  . Esophagogastroduodenoscopy N/A 01/19/2015    Procedure: ESOPHAGOGASTRODUODENOSCOPY (EGD);  Surgeon: Rogene Houston, MD;  Location: [facility];  Service: Endoscopy;  Laterality: N/A;  910  . Esophageal dilation N/A 01/19/2015    Procedure: ESOPHAGEAL DILATION;  Surgeon: Rogene Houston, MD;  Location: [facility];  Service: Endoscopy;  Laterality: N/A;     Allergies  Allergen Reactions  . Altace [Ramipril] Rash      Family History  Problem Relation Age of Onset  . Heart failure Mother   . Bladder Cancer Maternal Uncle     smoker  . Ovarian cancer Maternal Grandmother     dx. 79s; +surgery  . Stroke Brother   . Breast cancer Maternal Aunt     dx. late 62s  . Breast cancer Cousin 71  . Prostate cancer Cousin 87  . Cancer Cousin     unspecified type  . Stroke Paternal Uncle      Social History Ms. Malsch reports that she quit smoking about 60 years ago. Her smoking use included Cigarettes. She started smoking about 61 years ago. She has never used smokeless tobacco. Ms. Tlatelpa reports that she drinks alcohol.   Review of Systems CONSTITUTIONAL: No weight loss, fever, chills, weakness or fatigue.  HEENT: Eyes: No visual loss, blurred vision, double vision or yellow sclerae.No hearing loss, sneezing, congestion, runny nose or sore throat.  SKIN: No rash or itching.  CARDIOVASCULAR: per HPI RESPIRATORY: per HPI  GASTROINTESTINAL: No anorexia, nausea, vomiting or diarrhea. No abdominal pain or blood.  GENITOURINARY: No burning on urination, no polyuria NEUROLOGICAL: No headache, dizziness, syncope, paralysis, ataxia,  numbness or tingling in the extremities. No change in bowel or bladder control.  MUSCULOSKELETAL: No muscle, back pain, joint pain or stiffness.  LYMPHATICS: No enlarged nodes. No history of splenectomy.  PSYCHIATRIC: No history of depression or anxiety.  ENDOCRINOLOGIC: No reports of sweating, cold or heat intolerance. No polyuria or polydipsia.  Marland Kitchen   Physical Examination Filed Vitals:   05/29/16 1328  BP: 92/60  Pulse: 52   Filed Vitals:   05/29/16 1328  Weight: 116 lb 9.6 oz (52.889 kg)    Gen: resting comfortably, no acute distress HEENT: no scleral icterus, pupils equal round and  reactive, no palptable cervical adenopathy,  CV: RRR, 2/6 systolic murmur apex Resp: Clear to auscultation bilaterally GI: abdomen is soft, non-tender, non-distended, normal bowel sounds, no hepatosplenomegaly MSK: extremities are warm, no edema.  Skin: warm, no rash Neuro:  no focal deficits Psych: appropriate affect   Diagnostic Studies 12/2013 Echo LVEF 45-50%, elevated LA pressure, mild AI, moderate MR, mod TR, PASP 46  10/2014 Echo Study Conclusions  - Left ventricle: The cavity size was normal. Wall thickness was normal. Systolic function was mildly to moderately reduced. The estimated ejection fraction was in the range of 40% to 45%. - Aortic valve: There was mild regurgitation. Regurgitation pressure half-time: 794 ms. - Mitral valve: Mildly calcified annulus. There was mild to moderate regurgitation. MR vena contracta is 0.4 cm. - Left atrium: The atrium was severely dilated. - Right ventricle: The interventricular septum is flattened in systole and diastole, consistent with RV pressure and volume overload. The cavity size was mildly dilated. Systolic function was low normal. RV TAPSE is 1.6 cm. - Right atrium: The atrium was severely dilated. - Atrial septum: No defect or patent foramen ovale was identified. - Pulmonary arteries: Systolic pressure was moderately  increased. PA peak pressure: 60 mm Hg (S). - Technically adequate study.   01/2016 echo Study Conclusions  - Left ventricle: The cavity size was normal. Wall thickness was  normal. Systolic function was mildly to moderately reduced. The  estimated ejection fraction was in the range of 40% to 45%.  Diffuse hypokinesis. - Aortic valve: Mildly calcified annulus. Trileaflet; mildly  thickened leaflets. There was mild to moderate regurgitation.  Valve area (VTI): 2.35 cm^2. Valve area (Vmax): 2.4 cm^2. Valve  area (Vmean): 2.6 cm^2. Regurgitation pressure half-time: 690 ms. - Mitral valve: Mildly calcified annulus. Mildly thickened leaflets  . There was mild to moderate regurgitation. - Left atrium: The atrium was severely dilated. - Right atrium: The atrium was severely dilated. - Pulmonary arteries: Systolic pressure was moderately increased.  PA peak pressure: 42 mm Hg (S). - Technically adequate study.  Assessment and Plan  1. Chronic systolic HF - previous history of LV systolic dysfunction thought to be related to Adriamycin - last echo 10/2014 LVEF 40-45% - reports some SOB recently, she will increase to 40mg  in the AM and 20mg  in PM for next few days and follow symptoms - ongoing intermittent SOB/DOE episodes that seem out of proportion to her cardiac history. Previous abnormal CXR with history of previous chest radiation, we will obtain PFTs.   2. HTN  - at goal, she we will continue current meds   3. Paroxysmal afib  - mild symptoms, overall not bothersome.  - EKG in clinic shows rate controlled afib - continue coumadin for stroke prevention, CHADS2Vasc score of 4  4. Mitral regurgitation - mild to moderate by echo, continue to moniotor.    F/u 3 months      Arnoldo Lenis, M.D.

## 2016-05-30 ENCOUNTER — Ambulatory Visit (HOSPITAL_COMMUNITY)
Admission: RE | Admit: 2016-05-30 | Discharge: 2016-05-30 | Disposition: A | Discharge status: Home / Self Care | Payer: Medicare Other | Source: Ambulatory Visit | Attending: Cardiology | Admitting: Cardiology

## 2016-05-30 DIAGNOSIS — R0602 Shortness of breath: Secondary | ICD-10-CM

## 2016-05-30 LAB — PULMONARY FUNCTION TEST
DL/VA % pred: 85 %
DL/VA: 3.48 ml/min/mmHg/L
DLCO UNC: 10.35 ml/min/mmHg
DLCO unc % pred: 59 %
FEF 25-75 POST: 1.85 L/s
FEF 25-75 Pre: 1.06 L/sec
FEF2575-%Change-Post: 75 %
FEF2575-%Pred-Post: 179 %
FEF2575-%Pred-Pre: 102 %
FEV1-%CHANGE-POST: 7 %
FEV1-%PRED-PRE: 88 %
FEV1-%Pred-Post: 95 %
FEV1-POST: 1.36 L
FEV1-Pre: 1.26 L
FEV1FVC-%Change-Post: 3 %
FEV1FVC-%PRED-PRE: 106 %
FEV6-%CHANGE-POST: 3 %
FEV6-%PRED-POST: 92 %
FEV6-%Pred-Pre: 89 %
FEV6-POST: 1.67 L
FEV6-Pre: 1.62 L
FEV6FVC-%CHANGE-POST: -1 %
FEV6FVC-%PRED-POST: 106 %
FEV6FVC-%Pred-Pre: 107 %
FVC-%Change-Post: 4 %
FVC-%Pred-Post: 86 %
FVC-%Pred-Pre: 83 %
FVC-Post: 1.69 L
FVC-Pre: 1.62 L
PRE FEV6/FVC RATIO: 100 %
Post FEV1/FVC ratio: 80 %
Post FEV6/FVC ratio: 99 %
Pre FEV1/FVC ratio: 78 %
RV % PRED: 92 %
RV: 2.02 L
TLC % PRED: 89 %
TLC: 3.86 L

## 2016-05-30 MED ORDER — ALBUTEROL SULFATE (2.5 MG/3ML) 0.083% IN NEBU
2.5000 mg | INHALATION_SOLUTION | Freq: Once | RESPIRATORY_TRACT | Status: AC
  Administered 2016-05-30: 2.5 mg via RESPIRATORY_TRACT

## 2016-06-03 ENCOUNTER — Encounter (HOSPITAL_COMMUNITY): Payer: Self-pay

## 2016-06-05 ENCOUNTER — Other Ambulatory Visit (HOSPITAL_COMMUNITY): Payer: Self-pay | Admitting: Internal Medicine

## 2016-06-05 DIAGNOSIS — R0602 Shortness of breath: Secondary | ICD-10-CM

## 2016-06-05 DIAGNOSIS — I482 Chronic atrial fibrillation: Secondary | ICD-10-CM | POA: Diagnosis not present

## 2016-06-05 DIAGNOSIS — I5022 Chronic systolic (congestive) heart failure: Secondary | ICD-10-CM | POA: Diagnosis not present

## 2016-06-05 DIAGNOSIS — G4709 Other insomnia: Secondary | ICD-10-CM | POA: Diagnosis not present

## 2016-06-09 DIAGNOSIS — Z961 Presence of intraocular lens: Secondary | ICD-10-CM | POA: Diagnosis not present

## 2016-06-09 DIAGNOSIS — H2512 Age-related nuclear cataract, left eye: Secondary | ICD-10-CM | POA: Diagnosis not present

## 2016-06-09 DIAGNOSIS — H04123 Dry eye syndrome of bilateral lacrimal glands: Secondary | ICD-10-CM | POA: Diagnosis not present

## 2016-06-09 DIAGNOSIS — H401132 Primary open-angle glaucoma, bilateral, moderate stage: Secondary | ICD-10-CM | POA: Diagnosis not present

## 2016-06-11 ENCOUNTER — Ambulatory Visit (INDEPENDENT_AMBULATORY_CARE_PROVIDER_SITE_OTHER): Payer: Medicare Other | Admitting: *Deleted

## 2016-06-11 ENCOUNTER — Telehealth (INDEPENDENT_AMBULATORY_CARE_PROVIDER_SITE_OTHER): Payer: Self-pay | Admitting: Internal Medicine

## 2016-06-11 DIAGNOSIS — Z5181 Encounter for therapeutic drug level monitoring: Secondary | ICD-10-CM | POA: Diagnosis not present

## 2016-06-11 DIAGNOSIS — Z7901 Long term (current) use of anticoagulants: Secondary | ICD-10-CM

## 2016-06-11 DIAGNOSIS — I4891 Unspecified atrial fibrillation: Secondary | ICD-10-CM

## 2016-06-11 LAB — POCT INR: INR: 3

## 2016-06-11 NOTE — Telephone Encounter (Signed)
Patient would like to speak to Karna Christmas, thinks she is have an issue with her potassium  [phone]

## 2016-06-11 NOTE — Telephone Encounter (Signed)
I have spoken with patient., She is going to get lab work from Dr. Willey Blade and bring by office. In the meantime, I will let Dr. Laural Golden know.

## 2016-06-13 ENCOUNTER — Encounter (INDEPENDENT_AMBULATORY_CARE_PROVIDER_SITE_OTHER): Payer: Self-pay

## 2016-06-16 ENCOUNTER — Ambulatory Visit (HOSPITAL_COMMUNITY)
Admission: RE | Admit: 2016-06-16 | Discharge: 2016-06-16 | Disposition: A | Discharge status: Home / Self Care | Payer: Medicare Other | Source: Ambulatory Visit | Attending: Internal Medicine | Admitting: Internal Medicine

## 2016-06-16 DIAGNOSIS — N289 Disorder of kidney and ureter, unspecified: Secondary | ICD-10-CM | POA: Insufficient documentation

## 2016-06-16 DIAGNOSIS — R0602 Shortness of breath: Secondary | ICD-10-CM | POA: Diagnosis not present

## 2016-06-16 DIAGNOSIS — E279 Disorder of adrenal gland, unspecified: Secondary | ICD-10-CM | POA: Diagnosis not present

## 2016-06-16 DIAGNOSIS — I517 Cardiomegaly: Secondary | ICD-10-CM | POA: Diagnosis not present

## 2016-06-24 ENCOUNTER — Other Ambulatory Visit (HOSPITAL_COMMUNITY): Payer: Self-pay | Admitting: Internal Medicine

## 2016-06-24 ENCOUNTER — Telehealth: Payer: Self-pay

## 2016-06-24 DIAGNOSIS — N2889 Other specified disorders of kidney and ureter: Secondary | ICD-10-CM

## 2016-06-24 DIAGNOSIS — E278 Other specified disorders of adrenal gland: Secondary | ICD-10-CM

## 2016-06-24 DIAGNOSIS — H539 Unspecified visual disturbance: Secondary | ICD-10-CM

## 2016-06-24 NOTE — Telephone Encounter (Signed)
Pt made aware. Put order in, will forward to Tg to schedule.

## 2016-06-24 NOTE — Telephone Encounter (Signed)
-----   Message from Arnoldo Lenis, MD sent at 06/24/2016  3:43 PM EDT ----- Please let patient know her eye doctor Dr Katy Fitch recommends a carotid US based on some changes he had found in her eye. Please order carotid US for vision change  Zandra Abts MD

## 2016-06-30 ENCOUNTER — Ambulatory Visit (HOSPITAL_COMMUNITY)
Admission: RE | Admit: 2016-06-30 | Discharge: 2016-06-30 | Disposition: A | Discharge status: Home / Self Care | Payer: Medicare Other | Source: Ambulatory Visit | Attending: Internal Medicine | Admitting: Internal Medicine

## 2016-06-30 DIAGNOSIS — N200 Calculus of kidney: Secondary | ICD-10-CM | POA: Diagnosis not present

## 2016-06-30 DIAGNOSIS — E278 Other specified disorders of adrenal gland: Secondary | ICD-10-CM

## 2016-06-30 DIAGNOSIS — I700 Atherosclerosis of aorta: Secondary | ICD-10-CM | POA: Diagnosis not present

## 2016-06-30 DIAGNOSIS — N2889 Other specified disorders of kidney and ureter: Secondary | ICD-10-CM | POA: Insufficient documentation

## 2016-06-30 MED ORDER — IOPAMIDOL (ISOVUE-300) INJECTION 61%
80.0000 mL | Freq: Once | INTRAVENOUS | Status: AC | PRN
  Administered 2016-06-30: 80 mL via INTRAVENOUS

## 2016-07-02 ENCOUNTER — Ambulatory Visit (INDEPENDENT_AMBULATORY_CARE_PROVIDER_SITE_OTHER): Payer: Medicare Other | Admitting: *Deleted

## 2016-07-02 DIAGNOSIS — Z5181 Encounter for therapeutic drug level monitoring: Secondary | ICD-10-CM

## 2016-07-02 DIAGNOSIS — Z7901 Long term (current) use of anticoagulants: Secondary | ICD-10-CM

## 2016-07-02 DIAGNOSIS — I4891 Unspecified atrial fibrillation: Secondary | ICD-10-CM

## 2016-07-02 LAB — POCT INR: INR: 3.4

## 2016-07-04 ENCOUNTER — Ambulatory Visit (HOSPITAL_COMMUNITY)
Admission: RE | Admit: 2016-07-04 | Discharge: 2016-07-04 | Disposition: A | Discharge status: Home / Self Care | Payer: Medicare Other | Source: Ambulatory Visit | Attending: Cardiology | Admitting: Cardiology

## 2016-07-04 DIAGNOSIS — I672 Cerebral atherosclerosis: Secondary | ICD-10-CM | POA: Insufficient documentation

## 2016-07-04 DIAGNOSIS — H539 Unspecified visual disturbance: Secondary | ICD-10-CM | POA: Diagnosis present

## 2016-07-04 DIAGNOSIS — I4891 Unspecified atrial fibrillation: Secondary | ICD-10-CM | POA: Insufficient documentation

## 2016-07-16 ENCOUNTER — Ambulatory Visit (INDEPENDENT_AMBULATORY_CARE_PROVIDER_SITE_OTHER): Payer: Medicare Other | Admitting: *Deleted

## 2016-07-16 DIAGNOSIS — I4891 Unspecified atrial fibrillation: Secondary | ICD-10-CM | POA: Diagnosis not present

## 2016-07-16 DIAGNOSIS — Z5181 Encounter for therapeutic drug level monitoring: Secondary | ICD-10-CM

## 2016-07-16 DIAGNOSIS — Z7901 Long term (current) use of anticoagulants: Secondary | ICD-10-CM | POA: Diagnosis not present

## 2016-07-16 LAB — POCT INR: INR: 1.7

## 2016-07-29 ENCOUNTER — Encounter (INDEPENDENT_AMBULATORY_CARE_PROVIDER_SITE_OTHER): Payer: Self-pay | Admitting: Internal Medicine

## 2016-07-29 ENCOUNTER — Ambulatory Visit (INDEPENDENT_AMBULATORY_CARE_PROVIDER_SITE_OTHER): Payer: Medicare Other | Admitting: Internal Medicine

## 2016-07-29 VITALS — BP 102/72 | Temp 97.4°F | Ht 59.0 in | Wt 113.4 lb

## 2016-07-29 DIAGNOSIS — R1314 Dysphagia, pharyngoesophageal phase: Secondary | ICD-10-CM

## 2016-07-29 DIAGNOSIS — K589 Irritable bowel syndrome without diarrhea: Secondary | ICD-10-CM | POA: Diagnosis not present

## 2016-07-29 DIAGNOSIS — R1319 Other dysphagia: Secondary | ICD-10-CM

## 2016-07-29 DIAGNOSIS — R1310 Dysphagia, unspecified: Secondary | ICD-10-CM

## 2016-07-29 MED ORDER — DICYCLOMINE HCL 10 MG PO CAPS: 10.0000 mg | ORAL_CAPSULE | Freq: Every day | ORAL | 3 refills | Status: DC

## 2016-07-29 NOTE — Progress Notes (Signed)
Presenting complaint;  Follow-up for irregular bowel movements and dysphagia.  Subjective:  Alicia Harrington is 82 year old Caucasian female who is here for scheduled visit accompanied by her husband Dr. Heide Spark. She was last seen on 02/19/2016. She she had tried hyoscyamine sublingual in the past but developed nausea and vomiting. She was advised to try IBgard. She states it helps for a few weeks and it stopped working. Now she is trying it on as-needed basis and it may be helping somewhat. She remains with irregular bowel movements. Most of her bowel movements occur after meals what she describes as "gastric attack". Bowel movement occurs within 30 minutes to 3 hours. She is afraid to go out to eat. He has urgency and afraid that she will have an accident. She has been taking one Levsin and Imodium on days when she goes out to eat and she believes it is helping. She states sporadic and Levsin use is not causing any side effects.  She has occasional nocturnal bowel movement. She denies melena or rectal bleeding. She has occasional nausea relieved with ondansetron. She says swallowing difficulty has not changed any. It is no worse and no better. She also complains of insomnia. She has been taking low-dose Ambien on but it is not helping. She has not lost any weight since her last visit.   Current Medications: Outpatient Encounter Prescriptions as of 07/29/2016  Medication Sig  . acetaminophen (TYLENOL) 500 MG tablet Take 500 mg by mouth every 6 (six) hours as needed.  Marland Kitchen albuterol (PROVENTIL HFA;VENTOLIN HFA) 108 (90 BASE) MCG/ACT inhaler Inhale 1-2 puffs into the lungs every 6 (six) hours as needed for wheezing or shortness of breath.  Marland Kitchen b complex vitamins tablet Take 1 tablet by mouth daily.  . cholecalciferol (VITAMIN D) 1000 UNITS tablet Take 1,000 Units by mouth daily.    . Cyanocobalamin (VITAMIN B 12 PO) Take 1,000 mcg by mouth daily.  . furosemide (LASIX) 20 MG tablet Take 40 mg by mouth daily.  Reported on 02/19/2016  . latanoprost (XALATAN) 0.005 % ophthalmic solution Place 1 drop into both eyes daily.  . metoprolol succinate (TOPROL-XL) 50 MG 24 hr tablet Take 1 tablet (50 mg total) by mouth daily.  . Multiple Vitamin (MULITIVITAMIN WITH MINERALS) TABS Take 1 tablet by mouth daily.  . ondansetron (ZOFRAN) 8 MG tablet Take 1 tablet (8 mg total) by mouth every 8 (eight) hours as needed for nausea or vomiting.  Marland Kitchen OVER THE COUNTER MEDICATION Take 1 tablet by mouth daily as needed. IB GARD  . potassium chloride SA (KLOR-CON M20) 20 MEQ tablet Take 0.5 tablets (10 mEq total) by mouth daily. For potassium replacement. Only takes when taking Lasix  . traMADol (ULTRAM) 50 MG tablet Take 50 mg by mouth daily as needed. Only takes if necessary  . valsartan (DIOVAN) 80 MG tablet Take 1 tablet (80 mg total) by mouth daily.  Marland Kitchen warfarin (COUMADIN) 5 MG tablet Take 5 mg by mouth.   . zolpidem (AMBIEN) 5 MG tablet Take 2.5 mg by mouth.   No facility-administered encounter medications on file as of 07/29/2016.      Objective: Blood pressure 102/72, temperature 97.4 F (36.3 C), temperature source Oral, height 4\' 11"  (1.499 m), weight 113 lb 6.4 oz (51.4 kg). Patient is alert and in no acute distress. Conjunctiva is pink. Sclera is nonicteric Oropharyngeal mucosa is normal. No neck masses or thyromegaly noted. Cardiac exam with irregular rhythm normal S1 and S2. No murmur or gallop noted. Lungs are  clear to auscultation. Abdomen is symmetrical. Bowel sounds are normal. On palpation abdomen is soft and nontender without organomegaly or masses. Liver edge is 4 cm below the costal margin felt to be due to ptosis. No LE edema or clubbing noted.   Assessment:  #1. Irritable bowel syndrome. She had transient response IBgard and now back to square one. Her bowels are irregular and therefore treatment may be difficult as she may switch between diarrhea and constipation. Hyoscyamine helped but she could  not take it on schedule because of nausea and vomiting. #2. Esophageal dysphagia most likely secondary to esophageal motility disorder. Esophagus was last dilated 18 months ago but did not provide much relief. If dysphagi worsens will consider esophageal manometry.    Plan:  Discontinue IBgard. Dicyclomine 10 mg by mouth every morning. Use glycerin and/or Dulcolax suppository on as-needed basis. Patient advised to keep stool diary and call with progress report in one month. Office visit in 3 months.

## 2016-07-29 NOTE — Patient Instructions (Signed)
Can use glycerin or Dulcolax suppository on an as-needed basis. Please call with progress report in one month.

## 2016-07-30 ENCOUNTER — Ambulatory Visit (INDEPENDENT_AMBULATORY_CARE_PROVIDER_SITE_OTHER): Payer: Medicare Other | Admitting: *Deleted

## 2016-07-30 DIAGNOSIS — Z5181 Encounter for therapeutic drug level monitoring: Secondary | ICD-10-CM | POA: Diagnosis not present

## 2016-07-30 DIAGNOSIS — I4891 Unspecified atrial fibrillation: Secondary | ICD-10-CM

## 2016-07-30 DIAGNOSIS — Z7901 Long term (current) use of anticoagulants: Secondary | ICD-10-CM | POA: Diagnosis not present

## 2016-07-30 LAB — POCT INR: INR: 2.6

## 2016-08-05 DIAGNOSIS — Z23 Encounter for immunization: Secondary | ICD-10-CM | POA: Diagnosis not present

## 2016-08-06 ENCOUNTER — Encounter (HOSPITAL_COMMUNITY): Payer: Self-pay | Admitting: Hematology & Oncology

## 2016-08-06 ENCOUNTER — Encounter (HOSPITAL_COMMUNITY): Payer: Medicare Other

## 2016-08-06 ENCOUNTER — Encounter (HOSPITAL_COMMUNITY): Payer: Medicare Other | Attending: Hematology & Oncology | Admitting: Hematology & Oncology

## 2016-08-06 VITALS — BP 118/78 | HR 69 | Temp 97.9°F | Resp 16 | Wt 114.3 lb

## 2016-08-06 DIAGNOSIS — D7589 Other specified diseases of blood and blood-forming organs: Secondary | ICD-10-CM | POA: Diagnosis not present

## 2016-08-06 DIAGNOSIS — I482 Chronic atrial fibrillation: Secondary | ICD-10-CM | POA: Insufficient documentation

## 2016-08-06 DIAGNOSIS — Z853 Personal history of malignant neoplasm of breast: Secondary | ICD-10-CM

## 2016-08-06 DIAGNOSIS — R5383 Other fatigue: Secondary | ICD-10-CM

## 2016-08-06 DIAGNOSIS — D696 Thrombocytopenia, unspecified: Secondary | ICD-10-CM | POA: Diagnosis not present

## 2016-08-06 DIAGNOSIS — Z7901 Long term (current) use of anticoagulants: Secondary | ICD-10-CM | POA: Insufficient documentation

## 2016-08-06 DIAGNOSIS — C50911 Malignant neoplasm of unspecified site of right female breast: Secondary | ICD-10-CM | POA: Diagnosis not present

## 2016-08-06 DIAGNOSIS — Z87891 Personal history of nicotine dependence: Secondary | ICD-10-CM | POA: Insufficient documentation

## 2016-08-06 DIAGNOSIS — I130 Hypertensive heart and chronic kidney disease with heart failure and stage 1 through stage 4 chronic kidney disease, or unspecified chronic kidney disease: Secondary | ICD-10-CM | POA: Insufficient documentation

## 2016-08-06 DIAGNOSIS — I509 Heart failure, unspecified: Secondary | ICD-10-CM | POA: Insufficient documentation

## 2016-08-06 DIAGNOSIS — G4700 Insomnia, unspecified: Secondary | ICD-10-CM | POA: Diagnosis not present

## 2016-08-06 DIAGNOSIS — M8580 Other specified disorders of bone density and structure, unspecified site: Secondary | ICD-10-CM | POA: Insufficient documentation

## 2016-08-06 LAB — TSH: TSH: 2.604 u[IU]/mL (ref 0.350–4.500)

## 2016-08-06 LAB — T4, FREE: Free T4: 1.23 ng/dL — ABNORMAL HIGH (ref 0.61–1.12)

## 2016-08-06 MED ORDER — ZOLPIDEM TARTRATE 5 MG PO TABS: 2.5000 mg | ORAL_TABLET | Freq: Every day | ORAL | 2 refills | Status: DC

## 2016-08-06 NOTE — Progress Notes (Signed)
Alicia [place], [place] [id number]    DIAGNOSIS:  Stage II R breast cancer Treated under NSABP 41 protocol. Could not tolerate Taxol plus trastuzumab secondary to heart failure June 2010 to August 2014 Tamoxifen August 2014 to 12/30/2013 Arimidex Tamoxifen 10 daily on 12/30/2013 by Dr. Stephenie Acres DEXA on 09/2013 with osteopenia Breast Cancer INDEX with score of 7.2, 9.4% risk of distant recurrance with a low likelihood of benefit from extended endocrine therapy   CURRENT THERAPY: Observation  INTERVAL HISTORY: Alicia Harrington 82 y.o. female returns for follow-up of a stage II breast cancer of the R breast.    She is doing well overall. She and her husband are planning a trip back to [facility]. She just had follow-up with Dr. Laural Golden on 9/5. She is up to date on mammography.  She notes she also recently underwent carotid ultrasound and CT imaging of the abdomen. She continues to follow with Dr. Harl Bowie.  She notes that she sleeps well when she takes her [place]. She wonders if that is a problem. She is requesting a refill. She thinks at times her fatigue is worse but thinks that may be age.  No other complaints today No problems with her breasts. Eating and energy are baseline.    MEDICAL HISTORY: Past Medical History:  Diagnosis Date  . Adenocarcinoma, breast ([place])    treated with lumpectomy, node dissection, chemo and RT  . Ataxia   . Breast cancer ([place]) 2009   IDC+DCIS of right breast; triple positive  . Cardiomyopathy 2009   possibly secondary to Adriamycin; EF 40% in 12/09 and 50% 3/10; pulmonary edema in 2009  . Chronic anticoagulation 04/16/2011  . Chronic kidney disease (CKD), stage I    when on diuretic with creatinine of 1.6  . Complication of anesthesia    patient vomits with demerol  . DJD (degenerative joint disease)    right knee and lumbosacral spine  . Hearing impairment   . History of angina 2001   normal cornary arteries  in 2001  . Hypertension   . Macular degeneration, dry    + cataracts  . Neuropathy (HCC)    fingers and toes  . PONV (postoperative nausea and vomiting)   . Retinal macular atrophy   . Sick sinus syndrome (HCC)    Paroxysmal to permanent AF; initially first degree AV block also noted    has HEARING IMPAIRMENT; HYPERTENSION; Atrial fibrillation ([place]); Chronic anticoagulation; Cardiomyopathy ([place]); Sick sinus syndrome ([place]); Chronic kidney disease (CKD), stage I; Adenocarcinoma, breast ([place]); DJD (degenerative joint disease); History of angina; Macular degeneration, dry; Fasting hyperglycemia; Verruca vulgaris; Encounter for therapeutic drug monitoring; Mitral regurgitation; Abdominal pain; History of breast cancer in female; Family history of breast cancer in female; and Genetic testing on her problem list.     is allergic to altace [ramipril].  [name] had no medications administered during this visit.  SURGICAL HISTORY: Past Surgical History:  Procedure Laterality Date  . APPENDECTOMY    . CATARACT EXTRACTION W/PHACO  10/11/2012   Procedure: CATARACT EXTRACTION PHACO AND INTRAOCULAR LENS PLACEMENT (IOC);  Surgeon: Williams Che, MD;  Location: [facility];  Service: Ophthalmology;  Laterality: Right;  CDE:10.82  . COLONOSCOPY  02/11/2012   Colonoscopy plus polypectomy x2, [facility]  . DILATION AND CURETTAGE OF UTERUS    . ESOPHAGEAL DILATION N/A 01/19/2015   Procedure: ESOPHAGEAL DILATION;  Surgeon: Rogene Houston, MD;  Location: [facility];  Service: Endoscopy;  Laterality: N/A;  . ESOPHAGOGASTRODUODENOSCOPY N/A 01/19/2015   Procedure: ESOPHAGOGASTRODUODENOSCOPY (EGD);  Surgeon: Rogene Houston, MD;  Location: [facility];  Service: Endoscopy;  Laterality: N/A;  910  . MASTECTOMY PARTIAL / LUMPECTOMY W/ AXILLARY LYMPHADENECTOMY  2009   Carcinoma of the breast  . MICRODISCECTOMY LUMBAR  2009   lumbosacral spine  . MIDDLE EAR SURGERY     Left  . PORT-A-CATH REMOVAL    .  TONSILLECTOMY      SOCIAL HISTORY: Social History   Social History  . Marital status: Married    Spouse name: N/A  . Number of children: N/A  . Years of education: N/A   Occupational History  .  Retired    retired   Social History Main Topics  . Smoking status: Former Smoker    Types: Cigarettes    Start date: 11/24/1954    Quit date: 12/23/1955  . Smokeless tobacco: Never Used     Comment: quit at age 49; 0.5 pack per wk for one year  . Alcohol use 0.0 oz/week     Comment: one glass of wine per day  . Drug use: No  . Sexual activity: Not on file   Other Topics Concern  . Not on file   Social History Narrative   Pt lives in [place] with spouse.  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    FAMILY HISTORY: Family History  Problem Relation Age of Onset  . Heart failure Mother   . Bladder Cancer Maternal Uncle     smoker  . Ovarian cancer Maternal Grandmother     dx. 63s; +surgery  . Stroke Brother   . Breast cancer Maternal Aunt     dx. late 73s  . Breast cancer Cousin 8  . Prostate cancer Cousin 61  . Cancer Cousin     unspecified type  . Stroke Paternal Uncle     Review of Systems  Constitutional: Positive for malaise/fatigue.  HENT: Negative.   Eyes: Negative.   Respiratory: Negative.   Cardiovascular: Negative.   Gastrointestinal: Negative.   Genitourinary: Negative.   Musculoskeletal: Positive for joint pain.  Skin: Negative.   Neurological: Negative.   Endo/Heme/Allergies: Negative.   Psychiatric/Behavioral: Negative.   14 point review of systems was performed and is negative except as detailed under history of present illness and above   PHYSICAL EXAMINATION  ECOG PERFORMANCE STATUS: 1 - Symptomatic but completely ambulatory  Vitals:   08/06/16 1214  BP: 118/78  Pulse: 69  Resp: 16  Temp: 97.9 F (36.6 C)    Physical Exam  Constitutional: She is oriented to person, place, and time and well-developed, well-nourished, and in  no distress.  HENT:  Head: Normocephalic and atraumatic.  Nose: Nose normal.  Mouth/Throat: Oropharynx is clear and moist. No oropharyngeal exudate.  Eyes: Conjunctivae and EOM are normal. Pupils are equal, round, and reactive to light. Right eye exhibits no discharge. Left eye exhibits no discharge. No scleral icterus.  Neck: Normal range of motion. Neck supple. No tracheal deviation present. No thyromegaly present.  Cardiovascular: Normal rate, regular rhythm and normal heart sounds.  Exam reveals no gallop and no friction rub.  SEM Pulmonary/Chest: Effort normal and breath sounds normal. She has no wheezes. She has no rales.  Bilateral breast exam performed and unremarkable for palpable abnormalities, no nipple changes and no skin retraction or dimpling. Bilateral axillae are clear  Abdominal: Soft. Bowel sounds are normal. She exhibits no distension and no  mass. There is no tenderness. There is no rebound and no guarding.  Musculoskeletal: Normal range of motion. She exhibits no edema.  Lymphadenopathy:    She has no cervical adenopathy.  Neurological: She is alert and oriented to person, place, and time. She has normal reflexes. No cranial nerve deficit. Gait normal. Coordination normal.  Skin: Skin is warm and dry. No rash noted.  Psychiatric: Mood, memory, affect and judgment normal.  Nursing note and vitals reviewed.   LABORATORY DATA: I have reviewed the labs below.  CBC    Component Value Date/Time   WBC 3.4 (L) 01/14/2016 1239   RBC 3.81 (L) 01/14/2016 1239   HGB 13.3 01/14/2016 1239   HCT 39.0 01/14/2016 1239   PLT 147 (L) 01/14/2016 1239   MCV 102.4 (H) 01/14/2016 1239   MCH 34.9 (H) 01/14/2016 1239   MCHC 34.1 01/14/2016 1239   RDW 13.7 01/14/2016 1239   LYMPHSABS 0.8 01/14/2016 1239   MONOABS 0.3 01/14/2016 1239   EOSABS 0.1 01/14/2016 1239   BASOSABS 0.0 01/14/2016 1239   CMP     Component Value Date/Time   NA 141 01/14/2016 1239   K 3.7 01/14/2016 1239    CL 105 01/14/2016 1239   CO2 25 01/14/2016 1239   GLUCOSE 103 (H) 01/14/2016 1239   BUN 37 (H) 01/14/2016 1239   CREATININE 1.03 (H) 01/14/2016 1239   CREATININE 0.98 04/07/2013 1154   CALCIUM 9.5 01/14/2016 1239   PROT 6.6 01/14/2016 1239   ALBUMIN 4.1 01/14/2016 1239   AST 37 01/14/2016 1239   ALT 25 01/14/2016 1239   ALKPHOS 155 (H) 01/14/2016 1239   BILITOT 1.3 (H) 01/14/2016 1239   GFRNONAA 50 (L) 01/14/2016 1239   GFRAA 57 (L) 01/14/2016 1239     ASSESSMENT and THERAPY PLAN:  Stage II R breast cancer Treated under NSABP 41 protocol. Could not tolerate Taxol plus trastuzumab secondary to heart failure Osteopenia BCI predictive with low likelihood of benefit from extended adjuvant therapy Macrocytosis with normal B12 and folate Thrombocytopenia Mild hyperbilirubinemia, intermittent Insomnia Fatigue  Her breast exam today was unremarkable. She has completed all recommended therapy for her prior right breast cancer. She is up to date on mammography, she obtains these at [facility] in [place].   She has a macrocytosis which I suspect may be posttreatment related but we will continue with ongoing follow-up.She has mild thrombocytopenia which is stable. She has intermittent hyperbilirubinemia. She does drink wine every evening. She has cardiomyopathy and paroxysmal A. fib. She has no known history of liver dysfunction or disease.  We will check her thyroid today per her request.   I have refilled her ambien.  She is scheduled for follow up at the coumadin clinic on 08/11/2016.  She will return for follow up in 6 months.  Orders Placed This Encounter  Procedures  . T4, free    Standing Status:   Future    Number of Occurrences:   1    Standing Expiration Date:   08/06/2017  . TSH    Standing Status:   Future    Number of Occurrences:   1    Standing Expiration Date:   08/06/2017    All questions were answered. The patient knows to call the clinic with any problems, questions or  concerns. We can certainly see the patient much sooner if necessary.  This document serves as a record of services personally performed by Ancil Linsey, MD. It was created on her behalf by Benjamine Mola  Caryl Pina, a trained medical scribe. The creation of this record is based on the scribe's personal observations and the provider's statements to them. This document has been checked and approved by the attending provider.  I have reviewed the above documentation for accuracy and completeness and I agree with the above.  This note was signed electronically Molli Hazard, MD

## 2016-08-06 NOTE — Patient Instructions (Addendum)
[place] at [facility] Discharge Instructions  RECOMMENDATIONS MADE BY THE CONSULTANT AND ANY TEST RESULTS WILL BE SENT TO YOUR REFERRING PHYSICIAN.  You saw Dr. Whitney Muse today. Labs today. Follow up in 6 months.  Thank you for choosing [place] at [facility] to provide your oncology and hematology care.  To afford each patient quality time with our provider, please arrive at least 15 minutes before your scheduled appointment time.   Beginning January 23rd 2017 lab work for the [facility] will be done in the  Main lab at [facility] on 1st floor. If you have a lab appointment with the [facility] please come in thru the  Main Entrance and check in at the main information desk  You need to re-schedule your appointment should you arrive 10 or more minutes late.  We strive to give you quality time with our providers, and arriving late affects you and other patients whose appointments are after yours.  Also, if you no show three or more times for appointments you may be dismissed from the clinic at the providers discretion.     Again, thank you for choosing [facility].  Our hope is that these requests will decrease the amount of time that you wait before being seen by our physicians.       _____________________________________________________________  Should you have questions after your visit to [facility], please contact our office at [phone] between the hours of 8:30 a.m. and 4:30 p.m.  Voicemails left after 4:30 p.m. will not be returned until the following business day.  For prescription refill requests, have your pharmacy contact our office.         Resources For Cancer Patients and their Caregivers ? American Cancer Society: Can assist with transportation, wigs, general needs, runs Look Good Feel Better.        424-602-2304 ? Cancer Care: Provides financial assistance, online support groups,  medication/co-pay assistance.  1-800-813-HOPE 351-124-3428) ? [place] Assists [place] cancer patients and their families through emotional , educational and financial support.  650-709-4132 ? [place] DSS Where to apply for food stamps, Medicaid and utility assistance. (563) 097-8317 ? [facility]: Transportation to medical appointments. 636-799-8993 ? Social Security Administration: May apply for disability if have a Stage IV cancer. 727 798 6640 519-837-2166 ? [employer], Disability and Transit Services: Assists with nutrition, care and transit needs. [place] Support Programs: @10RELATIVEDAYS @ > Cancer Support Group  2nd Tuesday of the month 1pm-2pm, Journey Room  > Creative Journey  3rd Tuesday of the month 1130am-1pm, Journey Room  > Look Good Feel Better  1st Wednesday of the month 10am-12 noon, Journey Room (Call [facility] to register [phone])

## 2016-08-11 ENCOUNTER — Ambulatory Visit (INDEPENDENT_AMBULATORY_CARE_PROVIDER_SITE_OTHER): Payer: Medicare Other | Admitting: *Deleted

## 2016-08-11 DIAGNOSIS — I4891 Unspecified atrial fibrillation: Secondary | ICD-10-CM

## 2016-08-11 DIAGNOSIS — Z5181 Encounter for therapeutic drug level monitoring: Secondary | ICD-10-CM | POA: Diagnosis not present

## 2016-08-11 DIAGNOSIS — Z7901 Long term (current) use of anticoagulants: Secondary | ICD-10-CM

## 2016-08-11 LAB — POCT INR: INR: 3.4

## 2016-08-16 ENCOUNTER — Encounter (HOSPITAL_COMMUNITY): Payer: Self-pay | Admitting: Hematology & Oncology

## 2016-09-04 DIAGNOSIS — Z7901 Long term (current) use of anticoagulants: Secondary | ICD-10-CM | POA: Diagnosis not present

## 2016-09-04 DIAGNOSIS — I4891 Unspecified atrial fibrillation: Secondary | ICD-10-CM | POA: Diagnosis not present

## 2016-09-04 DIAGNOSIS — Z5181 Encounter for therapeutic drug level monitoring: Secondary | ICD-10-CM | POA: Diagnosis not present

## 2016-09-11 LAB — PROTIME-INR: INR: 3.2 — AB (ref 0.9–1.1)

## 2016-09-12 ENCOUNTER — Telehealth: Payer: Self-pay | Admitting: *Deleted

## 2016-09-12 NOTE — Telephone Encounter (Signed)
Patient hasn't heard from PT/INR done @ [facility]. / tg

## 2016-09-15 ENCOUNTER — Ambulatory Visit (INDEPENDENT_AMBULATORY_CARE_PROVIDER_SITE_OTHER): Payer: Medicare Other | Admitting: *Deleted

## 2016-09-15 DIAGNOSIS — Z5181 Encounter for therapeutic drug level monitoring: Secondary | ICD-10-CM

## 2016-09-15 DIAGNOSIS — I4891 Unspecified atrial fibrillation: Secondary | ICD-10-CM

## 2016-09-15 NOTE — Telephone Encounter (Signed)
See coumadin note. 

## 2016-09-23 ENCOUNTER — Other Ambulatory Visit (INDEPENDENT_AMBULATORY_CARE_PROVIDER_SITE_OTHER): Payer: Self-pay | Admitting: Internal Medicine

## 2016-09-23 MED ORDER — HYOSCYAMINE SULFATE 0.125 MG SL SUBL: 0.1250 mg | SUBLINGUAL_TABLET | Freq: Three times a day (TID) | SUBLINGUAL | 5 refills | Status: DC | PRN

## 2016-09-23 NOTE — Telephone Encounter (Signed)
Dicyclomine is seen halitosis. Levsin SL helps and has no side effects. New prescription for Levsin SL 3 times a day when necessary 90 with 5 refills sent to [facility].

## 2016-09-29 DIAGNOSIS — Z79899 Other long term (current) drug therapy: Secondary | ICD-10-CM | POA: Diagnosis not present

## 2016-09-29 DIAGNOSIS — C50919 Malignant neoplasm of unspecified site of unspecified female breast: Secondary | ICD-10-CM | POA: Diagnosis not present

## 2016-09-29 DIAGNOSIS — I4891 Unspecified atrial fibrillation: Secondary | ICD-10-CM | POA: Diagnosis not present

## 2016-10-03 ENCOUNTER — Other Ambulatory Visit (INDEPENDENT_AMBULATORY_CARE_PROVIDER_SITE_OTHER): Payer: Self-pay | Admitting: *Deleted

## 2016-10-06 ENCOUNTER — Ambulatory Visit (INDEPENDENT_AMBULATORY_CARE_PROVIDER_SITE_OTHER): Payer: Medicare Other | Admitting: *Deleted

## 2016-10-06 DIAGNOSIS — I4891 Unspecified atrial fibrillation: Secondary | ICD-10-CM

## 2016-10-06 DIAGNOSIS — Z5181 Encounter for therapeutic drug level monitoring: Secondary | ICD-10-CM | POA: Diagnosis not present

## 2016-10-06 LAB — POCT INR: INR: 2.9

## 2016-10-07 DIAGNOSIS — R05 Cough: Secondary | ICD-10-CM | POA: Diagnosis not present

## 2016-10-07 DIAGNOSIS — I5022 Chronic systolic (congestive) heart failure: Secondary | ICD-10-CM | POA: Diagnosis not present

## 2016-10-07 DIAGNOSIS — E876 Hypokalemia: Secondary | ICD-10-CM | POA: Diagnosis not present

## 2016-10-13 DIAGNOSIS — H04123 Dry eye syndrome of bilateral lacrimal glands: Secondary | ICD-10-CM | POA: Diagnosis not present

## 2016-10-13 DIAGNOSIS — H401132 Primary open-angle glaucoma, bilateral, moderate stage: Secondary | ICD-10-CM | POA: Diagnosis not present

## 2016-10-13 DIAGNOSIS — H2512 Age-related nuclear cataract, left eye: Secondary | ICD-10-CM | POA: Diagnosis not present

## 2016-10-13 DIAGNOSIS — Z961 Presence of intraocular lens: Secondary | ICD-10-CM | POA: Diagnosis not present

## 2016-10-15 ENCOUNTER — Other Ambulatory Visit (INDEPENDENT_AMBULATORY_CARE_PROVIDER_SITE_OTHER): Payer: Self-pay | Admitting: *Deleted

## 2016-10-15 DIAGNOSIS — R197 Diarrhea, unspecified: Secondary | ICD-10-CM

## 2016-10-20 ENCOUNTER — Ambulatory Visit (INDEPENDENT_AMBULATORY_CARE_PROVIDER_SITE_OTHER): Payer: Medicare Other | Admitting: *Deleted

## 2016-10-20 DIAGNOSIS — I4891 Unspecified atrial fibrillation: Secondary | ICD-10-CM | POA: Diagnosis not present

## 2016-10-20 DIAGNOSIS — Z7901 Long term (current) use of anticoagulants: Secondary | ICD-10-CM

## 2016-10-20 DIAGNOSIS — Z5181 Encounter for therapeutic drug level monitoring: Secondary | ICD-10-CM

## 2016-10-20 LAB — POCT INR: INR: 2.4

## 2016-10-23 DIAGNOSIS — H903 Sensorineural hearing loss, bilateral: Secondary | ICD-10-CM | POA: Diagnosis not present

## 2016-10-23 DIAGNOSIS — H8023 Cochlear otosclerosis, bilateral: Secondary | ICD-10-CM | POA: Diagnosis not present

## 2016-10-23 DIAGNOSIS — H6123 Impacted cerumen, bilateral: Secondary | ICD-10-CM | POA: Diagnosis not present

## 2016-11-11 ENCOUNTER — Ambulatory Visit (INDEPENDENT_AMBULATORY_CARE_PROVIDER_SITE_OTHER): Payer: Medicare Other | Admitting: Internal Medicine

## 2016-11-11 ENCOUNTER — Encounter (INDEPENDENT_AMBULATORY_CARE_PROVIDER_SITE_OTHER): Payer: Self-pay | Admitting: Internal Medicine

## 2016-11-11 VITALS — BP 112/70 | HR 68 | Temp 97.4°F | Resp 18 | Ht 59.0 in | Wt 112.7 lb

## 2016-11-11 DIAGNOSIS — K580 Irritable bowel syndrome with diarrhea: Secondary | ICD-10-CM

## 2016-11-11 MED ORDER — HYOSCYAMINE SULFATE 0.125 MG SL SUBL: 0.1250 mg | SUBLINGUAL_TABLET | Freq: Two times a day (BID) | SUBLINGUAL | 1 refills | Status: DC | PRN

## 2016-11-11 NOTE — Progress Notes (Signed)
Presenting complaint;  Follow-up for IBS.  Subjective:  Alicia Har[place] is 82 year old Caucasian female who is here for scheduled visit accompanied by her husband Dr. Heide Spark. She was last seen on 07/29/2016. She called office few weeks ago that she was having diarrhea. Plans were made for her to have C. difficile but she hasn't had diarrhea anymore. She is taking Levsin every morning. Occasionally she has to use a second dose. She has anywhere from 1-4 stools per day. Most of her stools are soft to formed. Her averages around 2. She has not been constipated recently. She denies melena or rectal bleeding. She says dysphagia is somewhat better. He uses an inhaler occasionally and has not taken Zofran in over 3 months. She does aerobic exercise 5 days each week and she also walks the treadmill. She is not having any side effects with Levsin sublingual. She will need new prescription since she would be gone for a couple of months.   Current Medications: Outpatient Encounter Prescriptions as of 11/11/2016  Medication Sig  . acetaminophen (TYLENOL) 500 MG tablet Take 500 mg by mouth every 6 (six) hours as needed.  Marland Kitchen albuterol (PROVENTIL HFA;VENTOLIN HFA) 108 (90 BASE) MCG/ACT inhaler Inhale 1-2 puffs into the lungs every 6 (six) hours as needed for wheezing or shortness of breath.  Marland Kitchen b complex vitamins tablet Take 1 tablet by mouth daily.  . cholecalciferol (VITAMIN D) 1000 UNITS tablet Take 1,000 Units by mouth daily.    . Cyanocobalamin (VITAMIN B 12 PO) Take 1,000 mcg by mouth daily.  . furosemide (LASIX) 20 MG tablet Take 40 mg by mouth daily. Reported on 02/19/2016  . hyoscyamine (LEVSIN SL) 0.125 MG SL tablet Place 1 tablet (0.125 mg total) under the tongue 3 (three) times daily as needed.  . latanoprost (XALATAN) 0.005 % ophthalmic solution Place 1 drop into both eyes daily.  . metoprolol succinate (TOPROL-XL) 50 MG 24 hr tablet Take 1 tablet (50 mg total) by mouth daily.  . Multiple Vitamin  (MULITIVITAMIN WITH MINERALS) TABS Take 1 tablet by mouth daily.  . ondansetron (ZOFRAN) 8 MG tablet Take 1 tablet (8 mg total) by mouth every 8 (eight) hours as needed for nausea or vomiting.  . potassium chloride SA (KLOR-CON M20) 20 MEQ tablet Take 0.5 tablets (10 mEq total) by mouth daily. For potassium replacement. Only takes when taking Lasix (Patient taking differently: Take 40 mEq by mouth daily. For potassium replacement. Only takes when taking Lasix)  . traMADol (ULTRAM) 50 MG tablet Take 50 mg by mouth daily as needed. Only takes if necessary  . valsartan (DIOVAN) 80 MG tablet Take 1 tablet (80 mg total) by mouth daily.  Marland Kitchen warfarin (COUMADIN) 5 MG tablet Take 5 mg by mouth.   . zolpidem (AMBIEN) 5 MG tablet Take 0.5 tablets (2.5 mg total) by mouth at bedtime.   No facility-administered encounter medications on file as of 11/11/2016.      Objective: Blood pressure 112/70, pulse 68, temperature 97.4 F (36.3 C), temperature source Oral, resp. rate 18, height 4\' 11"  (1.499 m), weight 112 lb 11.2 oz (51.1 kg). Patient is alert and in no acute distress. Conjunctiva is pink. Sclera is nonicteric Oropharyngeal mucosa is normal. JVDs elevated. No neck masses or thyromegaly noted. Cardiac exam with regular rhythm normal S1 and S2. No murmur or gallop noted. Lungs are clear to auscultation. Abdomen is symmetrical. Bowel sounds are normal. On palpation abdomen is soft and nontender without organomegaly or masses. No LE edema or clubbing  noted.   Assessment:  #1. Irritable bowel syndrome. She is doing well with low dose hyoscyamine sublingual. She is not having any side effects. #2. Dysphagia secondary to esophageal motility disorder. She is coping well with this symptom.   Plan:  New prescription given for hyoscyamine sublingual 1 tablet twice a day when necessary 3 month supply with 1 refill. Office visit in one year.

## 2016-11-12 ENCOUNTER — Ambulatory Visit (INDEPENDENT_AMBULATORY_CARE_PROVIDER_SITE_OTHER): Payer: Medicare Other | Admitting: *Deleted

## 2016-11-12 DIAGNOSIS — I4891 Unspecified atrial fibrillation: Secondary | ICD-10-CM | POA: Diagnosis not present

## 2016-11-12 DIAGNOSIS — Z5181 Encounter for therapeutic drug level monitoring: Secondary | ICD-10-CM

## 2016-11-12 DIAGNOSIS — Z7901 Long term (current) use of anticoagulants: Secondary | ICD-10-CM

## 2016-11-12 LAB — POCT INR: INR: 1.9

## 2016-12-08 DIAGNOSIS — I4891 Unspecified atrial fibrillation: Secondary | ICD-10-CM | POA: Diagnosis not present

## 2016-12-08 LAB — PROTIME-INR: INR: 1.9 — AB (ref 0.9–1.1)

## 2016-12-12 ENCOUNTER — Ambulatory Visit (INDEPENDENT_AMBULATORY_CARE_PROVIDER_SITE_OTHER): Payer: Medicare Other | Admitting: Cardiology

## 2016-12-12 DIAGNOSIS — I4891 Unspecified atrial fibrillation: Secondary | ICD-10-CM

## 2016-12-12 DIAGNOSIS — Z5181 Encounter for therapeutic drug level monitoring: Secondary | ICD-10-CM

## 2016-12-29 DIAGNOSIS — Z5181 Encounter for therapeutic drug level monitoring: Secondary | ICD-10-CM | POA: Diagnosis not present

## 2016-12-29 DIAGNOSIS — I4891 Unspecified atrial fibrillation: Secondary | ICD-10-CM | POA: Diagnosis not present

## 2016-12-29 DIAGNOSIS — Z7901 Long term (current) use of anticoagulants: Secondary | ICD-10-CM | POA: Diagnosis not present

## 2016-12-30 LAB — PROTIME-INR: INR: 2.5 — AB (ref 0.9–1.1)

## 2016-12-31 ENCOUNTER — Ambulatory Visit (INDEPENDENT_AMBULATORY_CARE_PROVIDER_SITE_OTHER): Payer: Medicare Other | Admitting: *Deleted

## 2016-12-31 DIAGNOSIS — Z5181 Encounter for therapeutic drug level monitoring: Secondary | ICD-10-CM

## 2017-01-08 ENCOUNTER — Encounter (HOSPITAL_COMMUNITY): Payer: Self-pay

## 2017-01-30 ENCOUNTER — Encounter (INDEPENDENT_AMBULATORY_CARE_PROVIDER_SITE_OTHER): Payer: Self-pay | Admitting: *Deleted

## 2017-01-30 DIAGNOSIS — I4891 Unspecified atrial fibrillation: Secondary | ICD-10-CM | POA: Diagnosis not present

## 2017-01-30 DIAGNOSIS — Z7901 Long term (current) use of anticoagulants: Secondary | ICD-10-CM | POA: Diagnosis not present

## 2017-01-30 DIAGNOSIS — Z5181 Encounter for therapeutic drug level monitoring: Secondary | ICD-10-CM | POA: Diagnosis not present

## 2017-02-03 ENCOUNTER — Ambulatory Visit (HOSPITAL_COMMUNITY): Payer: Medicare Other

## 2017-02-05 ENCOUNTER — Telehealth: Payer: Self-pay | Admitting: *Deleted

## 2017-02-05 ENCOUNTER — Ambulatory Visit (INDEPENDENT_AMBULATORY_CARE_PROVIDER_SITE_OTHER): Payer: Medicare Other | Admitting: *Deleted

## 2017-02-05 DIAGNOSIS — Z5181 Encounter for therapeutic drug level monitoring: Secondary | ICD-10-CM

## 2017-02-05 LAB — POCT INR: INR: 2.4

## 2017-02-05 NOTE — Telephone Encounter (Signed)
Alicia [place] called stating that her PT was 2.4 02/05/17  Please call patient.

## 2017-02-05 NOTE — Telephone Encounter (Signed)
Done.  See coumadin note. 

## 2017-02-24 DIAGNOSIS — I5022 Chronic systolic (congestive) heart failure: Secondary | ICD-10-CM | POA: Diagnosis not present

## 2017-02-24 DIAGNOSIS — Z79899 Other long term (current) drug therapy: Secondary | ICD-10-CM | POA: Diagnosis not present

## 2017-02-24 DIAGNOSIS — E876 Hypokalemia: Secondary | ICD-10-CM | POA: Diagnosis not present

## 2017-02-25 ENCOUNTER — Encounter (HOSPITAL_COMMUNITY): Payer: Medicare Other | Attending: Oncology | Admitting: Oncology

## 2017-02-25 ENCOUNTER — Encounter (HOSPITAL_COMMUNITY): Payer: Self-pay

## 2017-02-25 VITALS — BP 138/59 | HR 64 | Temp 98.1°F | Resp 18 | Wt 116.4 lb

## 2017-02-25 DIAGNOSIS — Z853 Personal history of malignant neoplasm of breast: Secondary | ICD-10-CM

## 2017-02-25 DIAGNOSIS — C50911 Malignant neoplasm of unspecified site of right female breast: Secondary | ICD-10-CM

## 2017-02-25 NOTE — Progress Notes (Signed)
Alicia Harrington, [place] [address]    DIAGNOSIS:  Stage II R breast cancer Treated under NSABP 41 protocol. Could not tolerate Taxol plus trastuzumab secondary to heart failure June 2010 to August 2014 Tamoxifen August 2014 to 12/30/2013 Arimidex Tamoxifen 10 daily on 12/30/2013 by Dr. Stephenie Acres DEXA on 09/2013 with osteopenia Breast Cancer INDEX with score of 7.2, 9.4% risk of distant recurrance with a low likelihood of benefit from extended endocrine therapy   CURRENT THERAPY: Observation  INTERVAL HISTORY: Alicia Harrington 83 y.o. female returns for follow-up of a stage II breast cancer of the R breast.    She has felt a right breast mass for the past month. It is painful when she touches it, and it can be moved around. She has a mammogram scheduled for tomorrow. Denies feeling lymph nodes in her armpits. She has had a lot more leg swelling and SOB recently. Denies palpitations, chest pain, weight loss, abdominal pain, or any other concerns.   MEDICAL HISTORY: Past Medical History:  Diagnosis Date  . Adenocarcinoma, breast ([place])    treated with lumpectomy, node dissection, chemo and RT  . Ataxia   . Breast cancer ([place]) 2009   IDC+DCIS of right breast; triple positive  . Cardiomyopathy 2009   possibly secondary to Adriamycin; EF 40% in 12/09 and 50% 3/10; pulmonary edema in 2009  . Chronic anticoagulation 04/16/2011  . Chronic kidney disease (CKD), stage I    when on diuretic with creatinine of 1.6  . Complication of anesthesia    patient vomits with demerol  . DJD (degenerative joint disease)    right knee and lumbosacral spine  . Hearing impairment   . History of angina 2001   normal cornary arteries in 2001  . Hypertension   . Macular degeneration, dry    + cataracts  . Neuropathy (HCC)    fingers and toes  . PONV (postoperative nausea and vomiting)   . Retinal macular atrophy   . Sick sinus syndrome (HCC)    Paroxysmal to permanent  AF; initially first degree AV block also noted    has HEARING IMPAIRMENT; HYPERTENSION; Atrial fibrillation ([place]); Chronic anticoagulation; Cardiomyopathy ([place]); Sick sinus syndrome ([place]); Chronic kidney disease (CKD), stage I; Adenocarcinoma, breast ([place]); DJD (degenerative joint disease); History of angina; Macular degeneration, dry; Fasting hyperglycemia; Verruca vulgaris; Encounter for therapeutic drug monitoring; Mitral regurgitation; Abdominal pain; History of breast cancer in female; Family history of breast cancer in female; and Genetic testing on her problem list.     is allergic to altace [ramipril].  Alicia Harrington had no medications administered during this visit.  SURGICAL HISTORY: Past Surgical History:  Procedure Laterality Date  . APPENDECTOMY    . CATARACT EXTRACTION W/PHACO  10/11/2012   Procedure: CATARACT EXTRACTION PHACO AND INTRAOCULAR LENS PLACEMENT (IOC);  Surgeon: Williams Che, MD;  Location: [facility];  Service: Ophthalmology;  Laterality: Right;  CDE:10.82  . COLONOSCOPY  02/11/2012   Colonoscopy plus polypectomy x2, [facility]  . DILATION AND CURETTAGE OF UTERUS    . ESOPHAGEAL DILATION N/A 01/19/2015   Procedure: ESOPHAGEAL DILATION;  Surgeon: Rogene Houston, MD;  Location: [facility];  Service: Endoscopy;  Laterality: N/A;  . ESOPHAGOGASTRODUODENOSCOPY N/A 01/19/2015   Procedure: ESOPHAGOGASTRODUODENOSCOPY (EGD);  Surgeon: Rogene Houston, MD;  Location: [facility];  Service: Endoscopy;  Laterality: N/A;  910  . MASTECTOMY PARTIAL / LUMPECTOMY W/ AXILLARY LYMPHADENECTOMY  2009   Carcinoma of the breast  .  MICRODISCECTOMY LUMBAR  2009   lumbosacral spine  . MIDDLE EAR SURGERY     Left  . PORT-A-CATH REMOVAL    . TONSILLECTOMY      SOCIAL HISTORY: Social History   Social History  . Marital status: Married    Spouse name: N/A  . Number of children: N/A  . Years of education: N/A   Occupational History  .  Retired    retired   Social History Main  Topics  . Smoking status: Former Smoker    Types: Cigarettes    Start date: 11/24/1954    Quit date: 12/23/1955  . Smokeless tobacco: Never Used     Comment: quit at age 83; 0.5 pack per wk for one year  . Alcohol use 0.0 oz/week     Comment: one glass of wine per day  . Drug use: No  . Sexual activity: Not on file   Other Topics Concern  . Not on file   Social History Narrative   Pt lives in [place] with spouse.  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    FAMILY HISTORY: Family History  Problem Relation Age of Onset  . Heart failure Mother   . Bladder Cancer Maternal Uncle     smoker  . Ovarian cancer Maternal Grandmother     dx. 71s; +surgery  . Stroke Brother   . Breast cancer Maternal Aunt     dx. late 40s  . Breast cancer Cousin 56  . Prostate cancer Cousin 38  . Cancer Cousin     unspecified type  . Stroke Paternal Uncle    Review of Systems  Constitutional: Negative for weight loss.  HENT: Negative.   Eyes: Negative.   Respiratory: Positive for shortness of breath.   Cardiovascular: Positive for leg swelling. Negative for chest pain and palpitations.  Gastrointestinal: Negative.  Negative for abdominal pain.  Genitourinary: Negative.   Musculoskeletal: Positive for joint pain (R shoulder).       Lump in right breast  Skin: Negative.   Neurological: Positive for weakness.  Endo/Heme/Allergies: Negative.   Psychiatric/Behavioral: Negative.   All other systems reviewed and are negative. 14 point review of systems was performed and is negative except as detailed under history of present illness and above  PHYSICAL EXAMINATION ECOG PERFORMANCE STATUS: 1 - Symptomatic but completely ambulatory  Vitals:   02/25/17 1434  BP: (!) 138/59  Pulse: 64  Resp: 18  Temp: 98.1 F (36.7 C)   Physical Exam  Constitutional: She is oriented to person, place, and time and well-developed, well-nourished, and in no distress.  HENT:  Head: Normocephalic and  atraumatic.  Eyes: EOM are normal. Pupils are equal, round, and reactive to light.  Neck: Normal range of motion. Neck supple.  Cardiovascular: Normal rate, regular rhythm and normal heart sounds.   Pulmonary/Chest: Effort normal and breath sounds normal. Left breast exhibits no mass, no nipple discharge and no skin change.    Abdominal: Soft. Bowel sounds are normal.  Musculoskeletal: Normal range of motion.  Neurological: She is alert and oriented to person, place, and time. Gait normal.  Skin: Skin is warm and dry.  Nursing note and vitals reviewed.  LABORATORY DATA: I have reviewed the labs below.  CBC    Component Value Date/Time   WBC 3.4 (L) 01/14/2016 1239   RBC 3.81 (L) 01/14/2016 1239   HGB 13.3 01/14/2016 1239   HCT 39.0 01/14/2016 1239   PLT 147 (L) 01/14/2016 1239  MCV 102.4 (H) 01/14/2016 1239   MCH 34.9 (H) 01/14/2016 1239   MCHC 34.1 01/14/2016 1239   RDW 13.7 01/14/2016 1239   LYMPHSABS 0.8 01/14/2016 1239   MONOABS 0.3 01/14/2016 1239   EOSABS 0.1 01/14/2016 1239   BASOSABS 0.0 01/14/2016 1239   CMP     Component Value Date/Time   NA 141 01/14/2016 1239   K 3.7 01/14/2016 1239   CL 105 01/14/2016 1239   CO2 25 01/14/2016 1239   GLUCOSE 103 (H) 01/14/2016 1239   BUN 37 (H) 01/14/2016 1239   CREATININE 1.03 (H) 01/14/2016 1239   CREATININE 0.98 04/07/2013 1154   CALCIUM 9.5 01/14/2016 1239   PROT 6.6 01/14/2016 1239   ALBUMIN 4.1 01/14/2016 1239   AST 37 01/14/2016 1239   ALT 25 01/14/2016 1239   ALKPHOS 155 (H) 01/14/2016 1239   BILITOT 1.3 (H) 01/14/2016 1239   GFRNONAA 50 (L) 01/14/2016 1239   GFRAA 57 (L) 01/14/2016 1239   RADIOLOGY: I have reviewed the images below and agree with the reported results  No new imaging   ASSESSMENT and THERAPY PLAN:  Stage II R breast cancer Treated under NSABP 41 protocol. Could not tolerate Taxol plus trastuzumab secondary to heart failure Osteopenia BCI predictive with low likelihood of benefit from  extended adjuvant therapy Macrocytosis with normal B12 and folate Thrombocytopenia Mild hyperbilirubinemia, intermittent Insomnia Fatigue  Her breast exam today was unremarkable. Proceed with mammogram tomorrow as scheduled. I will get her mammogram results from [facility] tomorrow. If she needs to proceed with biopsy of any masses seen on mammogram, then I will get it arranged.  Tentatively schedule for f/u in 2 weeks. If her mammogram is normal, then return for f/u in 6 months.   All questions were answered. The patient knows to call the clinic with any problems, questions or concerns. We can certainly see the patient much sooner if necessary.  This document serves as a record of services personally performed by Twana First, MD. It was created on her behalf by Martinique Casey, a trained medical scribe. The creation of this record is based on the scribe's personal observations and the provider's statements to them. This document has been checked and approved by the attending provider.  I have reviewed the above documentation for accuracy and completeness and I agree with the above.  This note was signed electronically Martinique M Casey

## 2017-02-25 NOTE — Patient Instructions (Signed)
[place] at [facility] Discharge Instructions  RECOMMENDATIONS MADE BY THE CONSULTANT AND ANY TEST RESULTS WILL BE SENT TO YOUR REFERRING PHYSICIAN.  You were seen today by Dr. Twana First Follow up in 2 weeks to review mammogram If the mammogram is negative we will cancel that appointment and follow up in 6 months See Amy up front for appointments   Thank you for choosing [place] at [facility] to provide your oncology and hematology care.  To afford each patient quality time with our provider, please arrive at least 15 minutes before your scheduled appointment time.    If you have a lab appointment with the [place] [facility] [place] please come in thru the  Main Entrance and check in at the main information desk  You need to re-schedule your appointment should you arrive 10 or more minutes late.  We strive to give you quality time with our providers, and arriving late affects you and other patients whose appointments are after yours.  Also, if you no show three or more times for appointments you may be dismissed from the clinic at the providers discretion.     Again, thank you for choosing [facility].  Our hope is that these requests will decrease the amount of time that you wait before being seen by our physicians.       _____________________________________________________________  Should you have questions after your visit to [facility], please contact our office at [phone] between the hours of 8:30 a.m. and 4:30 p.m.  Voicemails left after 4:30 p.m. will not be returned until the following business day.  For prescription refill requests, have your pharmacy contact our office.       Resources For Cancer Patients and their Caregivers ? American Cancer Society: Can assist with transportation, wigs, general needs, runs Look Good Feel Better.        810-723-8232 ? Cancer Care: Provides financial assistance,  online support groups, medication/co-pay assistance.  1-800-813-HOPE 903-141-7869) ? [place] Assists [place] cancer patients and their families through emotional , educational and financial support.  442-221-5027 ? [place] DSS Where to apply for food stamps, Medicaid and utility assistance. 8645064666 ? [facility]: Transportation to medical appointments. 307-658-5941 ? Social Security Administration: May apply for disability if have a Stage IV cancer. 651-650-9670 347-503-5850 ? [employer], Disability and Transit Services: Assists with nutrition, care and transit needs. [place] Support Programs: @10RELATIVEDAYS @ > Cancer Support Group  2nd Tuesday of the month 1pm-2pm, Journey Room  > Creative Journey  3rd Tuesday of the month 1130am-1pm, Journey Room  > Look Good Feel Better  1st Wednesday of the month 10am-12 noon, Journey Room (Call [place] to register [phone])

## 2017-02-26 DIAGNOSIS — Z853 Personal history of malignant neoplasm of breast: Secondary | ICD-10-CM | POA: Diagnosis not present

## 2017-03-02 ENCOUNTER — Ambulatory Visit (INDEPENDENT_AMBULATORY_CARE_PROVIDER_SITE_OTHER): Payer: Medicare Other | Admitting: *Deleted

## 2017-03-02 DIAGNOSIS — I480 Paroxysmal atrial fibrillation: Secondary | ICD-10-CM | POA: Diagnosis not present

## 2017-03-02 DIAGNOSIS — Z5181 Encounter for therapeutic drug level monitoring: Secondary | ICD-10-CM | POA: Diagnosis not present

## 2017-03-02 LAB — POCT INR: INR: 2.3

## 2017-03-03 ENCOUNTER — Other Ambulatory Visit (HOSPITAL_COMMUNITY): Payer: Self-pay | Admitting: *Deleted

## 2017-03-03 DIAGNOSIS — M1990 Unspecified osteoarthritis, unspecified site: Secondary | ICD-10-CM | POA: Diagnosis not present

## 2017-03-03 DIAGNOSIS — I5022 Chronic systolic (congestive) heart failure: Secondary | ICD-10-CM | POA: Diagnosis not present

## 2017-03-03 DIAGNOSIS — Z6823 Body mass index (BMI) 23.0-23.9, adult: Secondary | ICD-10-CM | POA: Diagnosis not present

## 2017-03-03 DIAGNOSIS — C50911 Malignant neoplasm of unspecified site of right female breast: Secondary | ICD-10-CM

## 2017-03-05 DIAGNOSIS — L720 Epidermal cyst: Secondary | ICD-10-CM | POA: Diagnosis not present

## 2017-03-05 DIAGNOSIS — L821 Other seborrheic keratosis: Secondary | ICD-10-CM | POA: Diagnosis not present

## 2017-03-05 DIAGNOSIS — L820 Inflamed seborrheic keratosis: Secondary | ICD-10-CM | POA: Diagnosis not present

## 2017-03-05 DIAGNOSIS — L814 Other melanin hyperpigmentation: Secondary | ICD-10-CM | POA: Diagnosis not present

## 2017-03-11 ENCOUNTER — Ambulatory Visit (HOSPITAL_COMMUNITY): Payer: Medicare Other

## 2017-03-12 ENCOUNTER — Telehealth (INDEPENDENT_AMBULATORY_CARE_PROVIDER_SITE_OTHER): Payer: Self-pay | Admitting: *Deleted

## 2017-03-12 ENCOUNTER — Encounter (INDEPENDENT_AMBULATORY_CARE_PROVIDER_SITE_OTHER): Payer: Self-pay | Admitting: *Deleted

## 2017-03-12 ENCOUNTER — Other Ambulatory Visit (INDEPENDENT_AMBULATORY_CARE_PROVIDER_SITE_OTHER): Payer: Self-pay | Admitting: *Deleted

## 2017-03-12 DIAGNOSIS — Z860100 Personal history of colon polyps, unspecified: Secondary | ICD-10-CM | POA: Insufficient documentation

## 2017-03-12 MED ORDER — PEG-KCL-NACL-NASULF-NA ASC-C 100 G PO SOLR: 1.0000 | Freq: Once | ORAL | 0 refills | Status: AC

## 2017-03-12 NOTE — Telephone Encounter (Signed)
Patient is scheduled for colonoscopy 03/30/17 and needs to stop coumadin 5 days prior -- please advise if ok to stop

## 2017-03-12 NOTE — Telephone Encounter (Signed)
Patient needs movi prep 

## 2017-03-12 NOTE — Telephone Encounter (Signed)
Referring MD/PCP: fagan   Procedure: tcs  Reason/Indication:  Hx polypsy  Has patient had this procedure before?  Yes, 2013  If so, when, by whom and where?    Is there a family history of colon cancer?  no  Who?  What age when diagnosed?    Is patient diabetic?   no      Does patient have prosthetic heart valve or mechanical valve?  no  Do you have a pacemaker?  no  Has patient ever had endocarditis? no  Has patient had joint replacement within last 12 months?  no  Does patient tend to be constipated or take laxatives? no  Does patient have a history of alcohol/drug use?  no  Is patient on Coumadin, Plavix and/or Aspirin? yes  Medications: see epic  Allergies: see epic  Medication Adjustment per Dr Laural Golden: coumadin 5 days  Procedure date & time: 03/30/17 at 830

## 2017-03-12 NOTE — Telephone Encounter (Signed)
agree

## 2017-03-16 DIAGNOSIS — Z961 Presence of intraocular lens: Secondary | ICD-10-CM | POA: Diagnosis not present

## 2017-03-16 DIAGNOSIS — H2512 Age-related nuclear cataract, left eye: Secondary | ICD-10-CM | POA: Diagnosis not present

## 2017-03-16 DIAGNOSIS — H04123 Dry eye syndrome of bilateral lacrimal glands: Secondary | ICD-10-CM | POA: Diagnosis not present

## 2017-03-16 DIAGNOSIS — H401132 Primary open-angle glaucoma, bilateral, moderate stage: Secondary | ICD-10-CM | POA: Diagnosis not present

## 2017-03-16 NOTE — Telephone Encounter (Signed)
OK to hold coumadin 5 days prior to procedure.  Resume night of procedure.

## 2017-03-16 NOTE — Telephone Encounter (Signed)
Patient aware.

## 2017-03-23 ENCOUNTER — Telehealth: Payer: Self-pay | Admitting: *Deleted

## 2017-03-23 NOTE — Telephone Encounter (Signed)
Please give pt a call  °

## 2017-03-23 NOTE — Telephone Encounter (Signed)
Called pt.  They would like for her to be bridged with lovenox for pending colonoscopy.  Appt made for pt to come in on 03/25/17.

## 2017-03-23 NOTE — Telephone Encounter (Signed)
Would like for Lattie Haw to call her today

## 2017-03-23 NOTE — Telephone Encounter (Signed)
See previous note

## 2017-03-25 ENCOUNTER — Ambulatory Visit (INDEPENDENT_AMBULATORY_CARE_PROVIDER_SITE_OTHER): Payer: Medicare Other | Admitting: *Deleted

## 2017-03-25 DIAGNOSIS — Z5181 Encounter for therapeutic drug level monitoring: Secondary | ICD-10-CM | POA: Diagnosis not present

## 2017-03-25 LAB — POCT INR: INR: 2.4

## 2017-03-30 ENCOUNTER — Encounter (HOSPITAL_COMMUNITY)
Admission: RE | Disposition: A | Discharge status: Home / Self Care | Payer: Self-pay | Source: Ambulatory Visit | Attending: Internal Medicine

## 2017-03-30 ENCOUNTER — Encounter (HOSPITAL_COMMUNITY): Payer: Self-pay | Admitting: *Deleted

## 2017-03-30 ENCOUNTER — Ambulatory Visit (HOSPITAL_COMMUNITY)
Admission: RE | Admit: 2017-03-30 | Discharge: 2017-03-30 | Disposition: A | Discharge status: Home / Self Care | Payer: Medicare Other | Source: Ambulatory Visit | Attending: Internal Medicine | Admitting: Internal Medicine

## 2017-03-30 DIAGNOSIS — K5730 Diverticulosis of large intestine without perforation or abscess without bleeding: Secondary | ICD-10-CM | POA: Diagnosis not present

## 2017-03-30 DIAGNOSIS — I495 Sick sinus syndrome: Secondary | ICD-10-CM | POA: Diagnosis not present

## 2017-03-30 DIAGNOSIS — Z853 Personal history of malignant neoplasm of breast: Secondary | ICD-10-CM | POA: Diagnosis not present

## 2017-03-30 DIAGNOSIS — M1711 Unilateral primary osteoarthritis, right knee: Secondary | ICD-10-CM | POA: Insufficient documentation

## 2017-03-30 DIAGNOSIS — I429 Cardiomyopathy, unspecified: Secondary | ICD-10-CM | POA: Diagnosis not present

## 2017-03-30 DIAGNOSIS — Z86010 Personal history of colonic polyps: Secondary | ICD-10-CM | POA: Diagnosis not present

## 2017-03-30 DIAGNOSIS — Z8249 Family history of ischemic heart disease and other diseases of the circulatory system: Secondary | ICD-10-CM | POA: Insufficient documentation

## 2017-03-30 DIAGNOSIS — K589 Irritable bowel syndrome without diarrhea: Secondary | ICD-10-CM | POA: Insufficient documentation

## 2017-03-30 DIAGNOSIS — G629 Polyneuropathy, unspecified: Secondary | ICD-10-CM | POA: Diagnosis not present

## 2017-03-30 DIAGNOSIS — Z803 Family history of malignant neoplasm of breast: Secondary | ICD-10-CM | POA: Insufficient documentation

## 2017-03-30 DIAGNOSIS — Z09 Encounter for follow-up examination after completed treatment for conditions other than malignant neoplasm: Secondary | ICD-10-CM | POA: Diagnosis not present

## 2017-03-30 DIAGNOSIS — Z87891 Personal history of nicotine dependence: Secondary | ICD-10-CM | POA: Insufficient documentation

## 2017-03-30 DIAGNOSIS — Z9221 Personal history of antineoplastic chemotherapy: Secondary | ICD-10-CM | POA: Insufficient documentation

## 2017-03-30 DIAGNOSIS — Z823 Family history of stroke: Secondary | ICD-10-CM | POA: Insufficient documentation

## 2017-03-30 DIAGNOSIS — Z888 Allergy status to other drugs, medicaments and biological substances status: Secondary | ICD-10-CM | POA: Insufficient documentation

## 2017-03-30 DIAGNOSIS — Z9841 Cataract extraction status, right eye: Secondary | ICD-10-CM | POA: Insufficient documentation

## 2017-03-30 DIAGNOSIS — Z8042 Family history of malignant neoplasm of prostate: Secondary | ICD-10-CM | POA: Insufficient documentation

## 2017-03-30 DIAGNOSIS — H3530 Unspecified macular degeneration: Secondary | ICD-10-CM | POA: Insufficient documentation

## 2017-03-30 DIAGNOSIS — K644 Residual hemorrhoidal skin tags: Secondary | ICD-10-CM | POA: Diagnosis not present

## 2017-03-30 DIAGNOSIS — M479 Spondylosis, unspecified: Secondary | ICD-10-CM | POA: Diagnosis not present

## 2017-03-30 DIAGNOSIS — Z7901 Long term (current) use of anticoagulants: Secondary | ICD-10-CM | POA: Diagnosis not present

## 2017-03-30 DIAGNOSIS — I129 Hypertensive chronic kidney disease with stage 1 through stage 4 chronic kidney disease, or unspecified chronic kidney disease: Secondary | ICD-10-CM | POA: Diagnosis not present

## 2017-03-30 DIAGNOSIS — D125 Benign neoplasm of sigmoid colon: Secondary | ICD-10-CM | POA: Diagnosis not present

## 2017-03-30 DIAGNOSIS — Z9011 Acquired absence of right breast and nipple: Secondary | ICD-10-CM | POA: Diagnosis not present

## 2017-03-30 DIAGNOSIS — K6389 Other specified diseases of intestine: Secondary | ICD-10-CM | POA: Diagnosis not present

## 2017-03-30 DIAGNOSIS — N181 Chronic kidney disease, stage 1: Secondary | ICD-10-CM | POA: Insufficient documentation

## 2017-03-30 DIAGNOSIS — Z79899 Other long term (current) drug therapy: Secondary | ICD-10-CM | POA: Insufficient documentation

## 2017-03-30 DIAGNOSIS — Z7951 Long term (current) use of inhaled steroids: Secondary | ICD-10-CM | POA: Insufficient documentation

## 2017-03-30 DIAGNOSIS — Z961 Presence of intraocular lens: Secondary | ICD-10-CM | POA: Insufficient documentation

## 2017-03-30 DIAGNOSIS — Z9049 Acquired absence of other specified parts of digestive tract: Secondary | ICD-10-CM | POA: Insufficient documentation

## 2017-03-30 DIAGNOSIS — I482 Chronic atrial fibrillation: Secondary | ICD-10-CM | POA: Diagnosis not present

## 2017-03-30 DIAGNOSIS — Z1211 Encounter for screening for malignant neoplasm of colon: Secondary | ICD-10-CM | POA: Insufficient documentation

## 2017-03-30 DIAGNOSIS — Z923 Personal history of irradiation: Secondary | ICD-10-CM | POA: Insufficient documentation

## 2017-03-30 DIAGNOSIS — Z9889 Other specified postprocedural states: Secondary | ICD-10-CM | POA: Insufficient documentation

## 2017-03-30 DIAGNOSIS — H9190 Unspecified hearing loss, unspecified ear: Secondary | ICD-10-CM | POA: Diagnosis not present

## 2017-03-30 HISTORY — PX: COLONOSCOPY: SHX5424

## 2017-03-30 HISTORY — PX: POLYPECTOMY: SHX5525

## 2017-03-30 SURGERY — COLONOSCOPY
Anesthesia: Moderate Sedation

## 2017-03-30 MED ORDER — SODIUM CHLORIDE 0.9 % IV SOLN
INTRAVENOUS | Status: DC
  Administered 2017-03-30: 08:00:00 via INTRAVENOUS

## 2017-03-30 MED ORDER — SODIUM CHLORIDE 0.9% FLUSH
INTRAVENOUS | Status: AC
  Filled 2017-03-30: qty 10

## 2017-03-30 MED ORDER — FENTANYL CITRATE (PF) 100 MCG/2ML IJ SOLN
INTRAMUSCULAR | Status: DC | PRN
  Administered 2017-03-30 (×2): 25 ug via INTRAVENOUS

## 2017-03-30 MED ORDER — MIDAZOLAM HCL 5 MG/5ML IJ SOLN
INTRAMUSCULAR | Status: DC | PRN
  Administered 2017-03-30 (×2): 1 mg via INTRAVENOUS
  Administered 2017-03-30: 2 mg via INTRAVENOUS
  Administered 2017-03-30: 1 mg via INTRAVENOUS

## 2017-03-30 MED ORDER — MEPERIDINE HCL 50 MG/ML IJ SOLN
INTRAMUSCULAR | Status: AC
  Filled 2017-03-30: qty 1

## 2017-03-30 MED ORDER — MIDAZOLAM HCL 5 MG/5ML IJ SOLN
INTRAMUSCULAR | Status: AC
  Filled 2017-03-30: qty 10

## 2017-03-30 MED ORDER — FENTANYL CITRATE (PF) 100 MCG/2ML IJ SOLN
INTRAMUSCULAR | Status: AC
  Filled 2017-03-30: qty 2

## 2017-03-30 MED ORDER — STERILE WATER FOR IRRIGATION IR SOLN
Status: DC | PRN
  Administered 2017-03-30: 09:00:00

## 2017-03-30 NOTE — H&P (Signed)
Alicia Harrington is an 83 y.o. female.   Chief Complaint: Patient is here for colonoscopy. HPI: Patient is 83 year old Caucasian female with multiple medical problems also has history of colonic adenomas and is here for surveillance colonoscopy. She has history of intermittent cramping and diarrhea and felt of IBS. She denies rectal bleeding. Last colonoscopy was in March 2013 with removal of 2 tubular adenomas. Personal history significant for breast carcinoma diagnosed 9 years ago and she is in remission. Warfarin has been on hold for 3 days. Family History is negative for CRC.  Past Medical History:  Diagnosis Date  . Adenocarcinoma, breast ([place])    treated with lumpectomy, node dissection, chemo and RT  . Ataxia   . Breast cancer ([place]) 2009   IDC+DCIS of right breast; triple positive  . Cardiomyopathy 2009   possibly secondary to Adriamycin; EF 40% in 12/09 and 50% 3/10; pulmonary edema in 2009  . Chronic anticoagulation 04/16/2011  . Chronic kidney disease (CKD), stage I    when on diuretic with creatinine of 1.6  . Complication of anesthesia    patient vomits with demerol  . DJD (degenerative joint disease)    right knee and lumbosacral spine  . Hearing impairment   . History of angina 2001   normal cornary arteries in 2001  . Hypertension   . Macular degeneration, dry    + cataracts  . Neuropathy    fingers and toes  . PONV (postoperative nausea and vomiting)   . Retinal macular atrophy   . Sick sinus syndrome (HCC)    Paroxysmal to permanent AF; initially first degree AV block also noted    Past Surgical History:  Procedure Laterality Date  . APPENDECTOMY    . CATARACT EXTRACTION W/PHACO  10/11/2012   Procedure: CATARACT EXTRACTION PHACO AND INTRAOCULAR LENS PLACEMENT (IOC);  Surgeon: Williams Che, MD;  Location: [facility];  Service: Ophthalmology;  Laterality: Right;  CDE:10.82  . COLONOSCOPY  02/11/2012   Colonoscopy plus polypectomy x2, Nejla Reasor  . DILATION AND  CURETTAGE OF UTERUS    . ESOPHAGEAL DILATION N/A 01/19/2015   Procedure: ESOPHAGEAL DILATION;  Surgeon: Rogene Houston, MD;  Location: [facility];  Service: Endoscopy;  Laterality: N/A;  . ESOPHAGOGASTRODUODENOSCOPY N/A 01/19/2015   Procedure: ESOPHAGOGASTRODUODENOSCOPY (EGD);  Surgeon: Rogene Houston, MD;  Location: [facility];  Service: Endoscopy;  Laterality: N/A;  910  . MASTECTOMY PARTIAL / LUMPECTOMY W/ AXILLARY LYMPHADENECTOMY  2009   Carcinoma of the breast  . MICRODISCECTOMY LUMBAR  2009   lumbosacral spine  . MIDDLE EAR SURGERY     Left  . PORT-A-CATH REMOVAL    . TONSILLECTOMY      Family History  Problem Relation Age of Onset  . Heart failure Mother   . Bladder Cancer Maternal Uncle     smoker  . Ovarian cancer Maternal Grandmother     dx. 74s; +surgery  . Stroke Brother   . Breast cancer Maternal Aunt     dx. late 10s  . Breast cancer Cousin 66  . Prostate cancer Cousin 87  . Cancer Cousin     unspecified type  . Stroke Paternal Uncle    Social History:  reports that she quit smoking about 61 years ago. Her smoking use included Cigarettes. She started smoking about 62 years ago. She has never used smokeless tobacco. She reports that she drinks alcohol. She reports that she does not use drugs.  Allergies:  Allergies  Allergen Reactions  .  Altace [Ramipril] Rash    Medications Prior to Admission  Medication Sig Dispense Refill  . cholecalciferol (VITAMIN D) 1000 UNITS tablet Take 1,000 Units by mouth daily.      . Emollient (ROC RETINOL CORREXION) CREA Apply 1 application topically every other day. Apply to face at night    . furosemide (LASIX) 20 MG tablet Take 40 mg by mouth daily. Reported on 02/19/2016    . hyoscyamine (LEVSIN SL) 0.125 MG SL tablet Place 1 tablet (0.125 mg total) under the tongue 2 (two) times daily as needed. (Patient taking differently: Place 0.125 mg under the tongue daily. ) 180 tablet 1  . latanoprost (XALATAN) 0.005 % ophthalmic  solution Place 1 drop into both eyes at bedtime.   3  . metoprolol succinate (TOPROL-XL) 50 MG 24 hr tablet Take 1 tablet (50 mg total) by mouth daily. 90 tablet 3  . ondansetron (ZOFRAN) 8 MG tablet Take 1 tablet (8 mg total) by mouth every 8 (eight) hours as needed for nausea or vomiting. 30 tablet 0  . potassium chloride SA (KLOR-CON M20) 20 MEQ tablet Take 0.5 tablets (10 mEq total) by mouth daily. For potassium replacement. Only takes when taking Lasix (Patient taking differently: Take 20 mEq by mouth daily. ) 90 tablet 3  . valsartan (DIOVAN) 80 MG tablet Take 1 tablet (80 mg total) by mouth daily. 90 tablet 3  . vitamin B-12 (CYANOCOBALAMIN) 1000 MCG tablet Take 1,000 mcg by mouth daily.    Marland Kitchen warfarin (COUMADIN) 5 MG tablet Take 5 mg by mouth every evening.     . zolpidem (AMBIEN) 5 MG tablet Take 0.5 tablets (2.5 mg total) by mouth at bedtime. (Patient taking differently: Take 2.5 mg by mouth daily as needed for sleep. ) 30 tablet 2  . albuterol (PROVENTIL HFA;VENTOLIN HFA) 108 (90 BASE) MCG/ACT inhaler Inhale 1-2 puffs into the lungs every 6 (six) hours as needed for wheezing or shortness of breath. 1 Inhaler 2  . traMADol (ULTRAM) 50 MG tablet Take 25 mg by mouth daily as needed for moderate pain. Only takes if necessary      No results found for this or any previous visit (from the past 48 hour(s)). No results found.  ROS  Blood pressure (!) 122/56, pulse 91, temperature 97.6 F (36.4 C), temperature source Oral, resp. rate (!) 23, height 5' (1.524 m), weight 112 lb (50.8 kg), SpO2 96 %. Physical Exam  Constitutional:  Well-developed thin Caucasian female in NAD.  HENT:  Mouth/Throat: Oropharynx is clear and moist.  Eyes: Conjunctivae are normal.  Neck: No thyromegaly present.  Cardiovascular:  Irregular rhythm normal S1 and S2. No murmur or gallop noted.  Respiratory: Effort normal and breath sounds normal.  GI: Soft. She exhibits no distension and no mass. There is no  tenderness.  Musculoskeletal: She exhibits no edema.  Lymphadenopathy:    She has no cervical adenopathy.  Neurological: She is alert.  Skin: Skin is warm and dry.     Assessment/Plan History of colonic adenomas. Surveillance colonoscopy.  Hildred Laser, MD 03/30/2017, 8:21 AM

## 2017-03-30 NOTE — Op Note (Signed)
[facility] Patient Name: Alicia Harrington Procedure Date: 03/30/2017 8:04 AM MRN: [id number] Date of Birth: 03-14-1932 Attending MD: Hildred Laser , MD CSN: [id number] Age: 83 Admit Type: Outpatient Procedure:                Colonoscopy Indications:              High risk colon cancer surveillance: Personal                            history of colonic polyps Providers:                Hildred Laser, MD, Otis Peak B. Sharon Seller, RN, Aram Candela Referring MD:             [name], MD Medicines:                Fentanyl 50 micrograms IV, Midazolam 5 mg IV Complications:            No immediate complications. Estimated Blood Loss:     Estimated blood loss was minimal. Procedure:                Pre-Anesthesia Assessment:                           - Prior to the procedure, a History and Physical                            was performed, and patient medications and                            allergies were reviewed. The patient's tolerance of                            previous anesthesia was also reviewed. The risks                            and benefits of the procedure and the sedation                            options and risks were discussed with the patient.                            All questions were answered, and informed consent                            was obtained. Prior Anticoagulants: The patient                            last took Coumadin (warfarin) 3 days prior to the                            procedure. ASA Grade Assessment: III - A patient  with severe systemic disease. After reviewing the                            risks and benefits, the patient was deemed in                            satisfactory condition to undergo the procedure.                           After obtaining informed consent, the colonoscope                            was passed under direct vision. Throughout the   procedure, the patient's blood pressure, pulse, and                            oxygen saturations were monitored continuously. The                            [id number] ([id number]) was introduced through the                            anus and advanced to the the cecum, identified by                            appendiceal orifice and ileocecal valve. The                            colonoscopy was somewhat difficult due to                            significant looping. Successful completion of the                            procedure was aided by increasing the dose of                            sedation medication, changing the patient's                            position, using manual pressure and withdrawing and                            reinserting the scope. The ileocecal valve,                            appendiceal orifice, and rectum were photographed.                            The quality of the bowel preparation was excellent. Scope In: 8:31:12 AM Scope Out: 9:02:45 AM Scope Withdrawal Time: 0 hours 10 minutes 30 seconds  Total Procedure Duration: 0 hours 31 minutes 33 seconds  Findings:      The perianal and digital rectal examinations were normal.  A 4 mm polyp was found in the sigmoid colon. The polyp was sessile. The       polyp was removed with a cold snare. Resection and retrieval were       complete.      A few small-mouthed diverticula were found in the sigmoid colon.      External hemorrhoids were found during retroflexion. The hemorrhoids       were small. Impression:               - One 4 mm polyp in the sigmoid colon, removed with                            a cold snare. Resected and retrieved.                           - Diverticulosis in the sigmoid colon.                           - External hemorrhoids. Moderate Sedation:      Moderate (conscious) sedation was administered by the endoscopy nurse       and supervised by the endoscopist. The following  parameters were       monitored: oxygen saturation, heart rate, blood pressure, CO2       capnography and response to care. Total physician intraservice time was       34 minutes. Recommendation:           - Patient has a contact number available for                            emergencies. The signs and symptoms of potential                            delayed complications were discussed with the                            patient. Return to normal activities tomorrow.                            Written discharge instructions were provided to the                            patient.                           - High fiber diet today.                           - Continue present medications.                           - Resume Coumadin (warfarin) at prior dose today.                            Refer to [facility] for further adjustment of  therapy.                           - Await pathology results.                           - No recommendation at this time regarding repeat                            colonoscopy due to age. Procedure Code(s):        --- Professional ---                           [phone], Colonoscopy, flexible; with removal of                            tumor(s), polyp(s), or other lesion(s) by snare                            technique                           99152, Moderate sedation services provided by the                            same physician or other qualified health care                            professional performing the diagnostic or                            therapeutic service that the sedation supports,                            requiring the presence of an independent trained                            observer to assist in the monitoring of the                            patient's level of consciousness and physiological                            status; initial 15 minutes of intraservice time,                             patient age 81 years or older                           [phone], Moderate sedation services; each additional                            15 minutes intraservice time Diagnosis Code(s):        --- Professional ---  Z86.010, Personal history of colonic polyps                           D12.5, Benign neoplasm of sigmoid colon                           K64.4, Residual hemorrhoidal skin tags                           K57.30, Diverticulosis of large intestine without                            perforation or abscess without bleeding CPT copyright 2016 American Medical Association. All rights reserved. The codes documented in this report are preliminary and upon coder review may  be revised to meet current compliance requirements. Hildred Laser, MD Hildred Laser, MD 03/30/2017 9:14:57 AM This report has been signed electronically. Number of Addenda: 0

## 2017-03-30 NOTE — Discharge Instructions (Addendum)
Colonoscopy, Adult, Care After This sheet gives you information about how to care for yourself after your procedure. Your health care provider may also give you more specific instructions. If you have problems or questions, contact your health care provider. What can I expect after the procedure? After the procedure, it is common to have:  A small amount of blood in your stool for 24 hours after the procedure.  Some gas.  Mild abdominal cramping or bloating. Follow these instructions at home: General instructions    For the first 24 hours after the procedure:  Do not drive or use machinery.  Do not sign important documents.  Do not drink alcohol.  Do your regular daily activities at a slower pace than normal.  Eat soft, easy-to-digest foods.  Rest often.  Take over-the-counter or prescription medicines only as told by your health care provider.  It is up to you to get the results of your procedure. Ask your health care provider, or the department performing the procedure, when your results will be ready. Relieving cramping and bloating   Try walking around when you have cramps or feel bloated.  Apply heat to your abdomen as told by your health care provider. Use a heat source that your health care provider recommends, such as a moist heat pack or a heating pad.  Place a towel between your skin and the heat source.  Leave the heat on for 20-30 minutes.  Remove the heat if your skin turns bright red. This is especially important if you are unable to feel pain, heat, or cold. You may have a greater risk of getting burned. Eating and drinking   Drink enough fluid to keep your urine clear or pale yellow.  Resume your normal diet as instructed by your health care provider. Avoid heavy or fried foods that are hard to digest.  Avoid drinking alcohol for as long as instructed by your health care provider. Contact a health care provider if:  You have blood in your stool 2-3  days after the procedure. Get help right away if:  You have more than a small spotting of blood in your stool.  You pass large blood clots in your stool.  Your abdomen is swollen.  You have nausea or vomiting.  You have a fever.  You have increasing abdominal pain that is not relieved with medicine. This information is not intended to replace advice given to you by your health care provider. Make sure you discuss any questions you have with your health care provider. Document Released: 06/24/2004 Document Revised: 08/04/2016 Document Reviewed: 01/22/2016 Elsevier Interactive Patient Education  2017 [place]. Resume warfarin at usual dose today. INR check in 7-10 days Resume other medications as before. High fiber diet. No driving for 24 hours. Physician will call with biopsy results

## 2017-04-02 ENCOUNTER — Encounter (HOSPITAL_COMMUNITY): Payer: Self-pay | Admitting: Internal Medicine

## 2017-04-06 ENCOUNTER — Ambulatory Visit (INDEPENDENT_AMBULATORY_CARE_PROVIDER_SITE_OTHER): Payer: Medicare Other | Admitting: *Deleted

## 2017-04-06 DIAGNOSIS — Z5181 Encounter for therapeutic drug level monitoring: Secondary | ICD-10-CM

## 2017-04-06 DIAGNOSIS — I480 Paroxysmal atrial fibrillation: Secondary | ICD-10-CM

## 2017-04-06 DIAGNOSIS — I4891 Unspecified atrial fibrillation: Secondary | ICD-10-CM | POA: Diagnosis not present

## 2017-04-06 LAB — POCT INR: INR: 3.9

## 2017-05-04 DIAGNOSIS — Z5181 Encounter for therapeutic drug level monitoring: Secondary | ICD-10-CM | POA: Diagnosis not present

## 2017-05-04 DIAGNOSIS — I480 Paroxysmal atrial fibrillation: Secondary | ICD-10-CM | POA: Diagnosis not present

## 2017-05-05 ENCOUNTER — Ambulatory Visit (INDEPENDENT_AMBULATORY_CARE_PROVIDER_SITE_OTHER): Payer: Medicare Other | Admitting: *Deleted

## 2017-05-05 DIAGNOSIS — Z5181 Encounter for therapeutic drug level monitoring: Secondary | ICD-10-CM

## 2017-05-05 LAB — POCT INR: INR: 2.2

## 2017-05-20 ENCOUNTER — Ambulatory Visit (INDEPENDENT_AMBULATORY_CARE_PROVIDER_SITE_OTHER): Payer: Medicare Other | Admitting: *Deleted

## 2017-05-20 DIAGNOSIS — Z5181 Encounter for therapeutic drug level monitoring: Secondary | ICD-10-CM | POA: Diagnosis not present

## 2017-05-20 DIAGNOSIS — I480 Paroxysmal atrial fibrillation: Secondary | ICD-10-CM | POA: Diagnosis not present

## 2017-05-20 DIAGNOSIS — I4891 Unspecified atrial fibrillation: Secondary | ICD-10-CM | POA: Diagnosis not present

## 2017-05-20 DIAGNOSIS — Z7901 Long term (current) use of anticoagulants: Secondary | ICD-10-CM

## 2017-05-20 LAB — POCT INR: INR: 2.6

## 2017-06-04 ENCOUNTER — Other Ambulatory Visit (HOSPITAL_COMMUNITY): Payer: Self-pay | Admitting: *Deleted

## 2017-06-22 ENCOUNTER — Ambulatory Visit (INDEPENDENT_AMBULATORY_CARE_PROVIDER_SITE_OTHER): Payer: Medicare Other | Admitting: *Deleted

## 2017-06-22 DIAGNOSIS — Z5181 Encounter for therapeutic drug level monitoring: Secondary | ICD-10-CM | POA: Diagnosis not present

## 2017-06-22 DIAGNOSIS — I480 Paroxysmal atrial fibrillation: Secondary | ICD-10-CM

## 2017-06-22 LAB — POCT INR: INR: 3

## 2017-06-24 DIAGNOSIS — H04123 Dry eye syndrome of bilateral lacrimal glands: Secondary | ICD-10-CM | POA: Diagnosis not present

## 2017-06-24 DIAGNOSIS — H401132 Primary open-angle glaucoma, bilateral, moderate stage: Secondary | ICD-10-CM | POA: Diagnosis not present

## 2017-06-24 DIAGNOSIS — Z961 Presence of intraocular lens: Secondary | ICD-10-CM | POA: Diagnosis not present

## 2017-06-24 DIAGNOSIS — H2512 Age-related nuclear cataract, left eye: Secondary | ICD-10-CM | POA: Diagnosis not present

## 2017-07-02 DIAGNOSIS — C50919 Malignant neoplasm of unspecified site of unspecified female breast: Secondary | ICD-10-CM | POA: Diagnosis not present

## 2017-07-02 DIAGNOSIS — I482 Chronic atrial fibrillation: Secondary | ICD-10-CM | POA: Diagnosis not present

## 2017-07-02 DIAGNOSIS — Z79899 Other long term (current) drug therapy: Secondary | ICD-10-CM | POA: Diagnosis not present

## 2017-07-02 DIAGNOSIS — I5022 Chronic systolic (congestive) heart failure: Secondary | ICD-10-CM | POA: Diagnosis not present

## 2017-07-02 DIAGNOSIS — M1990 Unspecified osteoarthritis, unspecified site: Secondary | ICD-10-CM | POA: Diagnosis not present

## 2017-07-03 ENCOUNTER — Ambulatory Visit (HOSPITAL_COMMUNITY): Payer: Medicare Other

## 2017-07-03 ENCOUNTER — Other Ambulatory Visit (HOSPITAL_COMMUNITY): Payer: Self-pay | Admitting: Adult Health

## 2017-07-03 ENCOUNTER — Encounter (HOSPITAL_COMMUNITY): Payer: Self-pay | Admitting: Adult Health

## 2017-07-03 DIAGNOSIS — C50911 Malignant neoplasm of unspecified site of right female breast: Secondary | ICD-10-CM

## 2017-07-03 DIAGNOSIS — G4700 Insomnia, unspecified: Secondary | ICD-10-CM

## 2017-07-03 MED ORDER — ZOLPIDEM TARTRATE 5 MG PO TABS: 2.5000 mg | ORAL_TABLET | Freq: Every evening | ORAL | 2 refills | Status: DC | PRN

## 2017-07-03 NOTE — Progress Notes (Signed)
Received refill request from pharmacy for Ambien.   [place] Controlled Substance Reporting System reviewed:      Mike Craze, NP [place] [phone]

## 2017-07-07 DIAGNOSIS — Z853 Personal history of malignant neoplasm of breast: Secondary | ICD-10-CM | POA: Diagnosis not present

## 2017-07-09 DIAGNOSIS — Z6824 Body mass index (BMI) 24.0-24.9, adult: Secondary | ICD-10-CM | POA: Diagnosis not present

## 2017-07-09 DIAGNOSIS — I5022 Chronic systolic (congestive) heart failure: Secondary | ICD-10-CM | POA: Diagnosis not present

## 2017-07-09 DIAGNOSIS — I482 Chronic atrial fibrillation: Secondary | ICD-10-CM | POA: Diagnosis not present

## 2017-07-10 ENCOUNTER — Ambulatory Visit (HOSPITAL_COMMUNITY): Payer: Medicare Other

## 2017-07-15 DIAGNOSIS — H8023 Cochlear otosclerosis, bilateral: Secondary | ICD-10-CM | POA: Diagnosis not present

## 2017-07-15 DIAGNOSIS — H9202 Otalgia, left ear: Secondary | ICD-10-CM | POA: Diagnosis not present

## 2017-07-15 DIAGNOSIS — H903 Sensorineural hearing loss, bilateral: Secondary | ICD-10-CM | POA: Diagnosis not present

## 2017-07-15 DIAGNOSIS — H73002 Acute myringitis, left ear: Secondary | ICD-10-CM | POA: Diagnosis not present

## 2017-07-20 ENCOUNTER — Ambulatory Visit (INDEPENDENT_AMBULATORY_CARE_PROVIDER_SITE_OTHER): Payer: Medicare Other | Admitting: *Deleted

## 2017-07-20 DIAGNOSIS — Z5181 Encounter for therapeutic drug level monitoring: Secondary | ICD-10-CM

## 2017-07-20 DIAGNOSIS — I480 Paroxysmal atrial fibrillation: Secondary | ICD-10-CM

## 2017-07-20 LAB — POCT INR: INR: 4.8

## 2017-07-21 DIAGNOSIS — H2512 Age-related nuclear cataract, left eye: Secondary | ICD-10-CM | POA: Diagnosis not present

## 2017-07-21 DIAGNOSIS — Z961 Presence of intraocular lens: Secondary | ICD-10-CM | POA: Diagnosis not present

## 2017-07-21 DIAGNOSIS — H04123 Dry eye syndrome of bilateral lacrimal glands: Secondary | ICD-10-CM | POA: Diagnosis not present

## 2017-07-21 DIAGNOSIS — H401132 Primary open-angle glaucoma, bilateral, moderate stage: Secondary | ICD-10-CM | POA: Diagnosis not present

## 2017-07-30 DIAGNOSIS — H903 Sensorineural hearing loss, bilateral: Secondary | ICD-10-CM | POA: Diagnosis not present

## 2017-07-30 DIAGNOSIS — M542 Cervicalgia: Secondary | ICD-10-CM | POA: Diagnosis not present

## 2017-07-30 DIAGNOSIS — H8023 Cochlear otosclerosis, bilateral: Secondary | ICD-10-CM | POA: Diagnosis not present

## 2017-07-30 DIAGNOSIS — R51 Headache: Secondary | ICD-10-CM | POA: Diagnosis not present

## 2017-07-31 ENCOUNTER — Other Ambulatory Visit: Payer: Self-pay | Admitting: Otolaryngology

## 2017-07-31 ENCOUNTER — Other Ambulatory Visit: Payer: Self-pay

## 2017-07-31 DIAGNOSIS — R51 Headache: Principal | ICD-10-CM

## 2017-07-31 DIAGNOSIS — H903 Sensorineural hearing loss, bilateral: Secondary | ICD-10-CM

## 2017-07-31 DIAGNOSIS — R519 Headache, unspecified: Secondary | ICD-10-CM

## 2017-07-31 DIAGNOSIS — H8023 Cochlear otosclerosis, bilateral: Secondary | ICD-10-CM

## 2017-07-31 DIAGNOSIS — M542 Cervicalgia: Secondary | ICD-10-CM

## 2017-08-03 ENCOUNTER — Ambulatory Visit
Admission: RE | Admit: 2017-08-03 | Discharge: 2017-08-03 | Disposition: A | Discharge status: Home / Self Care | Payer: Medicare Other | Source: Ambulatory Visit | Attending: Otolaryngology | Admitting: Otolaryngology

## 2017-08-03 DIAGNOSIS — H9192 Unspecified hearing loss, left ear: Secondary | ICD-10-CM | POA: Diagnosis not present

## 2017-08-03 DIAGNOSIS — M542 Cervicalgia: Secondary | ICD-10-CM

## 2017-08-03 DIAGNOSIS — R519 Headache, unspecified: Secondary | ICD-10-CM

## 2017-08-03 DIAGNOSIS — H903 Sensorineural hearing loss, bilateral: Secondary | ICD-10-CM

## 2017-08-03 DIAGNOSIS — H8023 Cochlear otosclerosis, bilateral: Secondary | ICD-10-CM

## 2017-08-03 DIAGNOSIS — R51 Headache: Principal | ICD-10-CM

## 2017-08-03 DIAGNOSIS — M47812 Spondylosis without myelopathy or radiculopathy, cervical region: Secondary | ICD-10-CM | POA: Diagnosis not present

## 2017-08-03 MED ORDER — IOPAMIDOL (ISOVUE-300) INJECTION 61%
80.0000 mL | Freq: Once | INTRAVENOUS | Status: AC | PRN
  Administered 2017-08-03: 80 mL via INTRAVENOUS

## 2017-08-05 ENCOUNTER — Ambulatory Visit (INDEPENDENT_AMBULATORY_CARE_PROVIDER_SITE_OTHER): Payer: Medicare Other | Admitting: *Deleted

## 2017-08-05 DIAGNOSIS — I480 Paroxysmal atrial fibrillation: Secondary | ICD-10-CM

## 2017-08-05 DIAGNOSIS — Z5181 Encounter for therapeutic drug level monitoring: Secondary | ICD-10-CM

## 2017-08-05 LAB — POCT INR: INR: 1.9

## 2017-08-07 ENCOUNTER — Ambulatory Visit (HOSPITAL_COMMUNITY): Payer: Medicare Other | Admitting: Oncology

## 2017-08-10 ENCOUNTER — Encounter (HOSPITAL_COMMUNITY): Payer: Medicare Other | Attending: Oncology | Admitting: Oncology

## 2017-08-10 ENCOUNTER — Encounter (HOSPITAL_COMMUNITY): Payer: Self-pay | Admitting: Oncology

## 2017-08-10 VITALS — BP 128/73 | HR 94 | Resp 16 | Ht 60.0 in | Wt 121.0 lb

## 2017-08-10 DIAGNOSIS — Z853 Personal history of malignant neoplasm of breast: Secondary | ICD-10-CM

## 2017-08-10 DIAGNOSIS — C50919 Malignant neoplasm of unspecified site of unspecified female breast: Secondary | ICD-10-CM

## 2017-08-10 NOTE — Progress Notes (Signed)
Alicia Noble, MD [address] [place] [address]    DIAGNOSIS:  Stage II R breast cancer Treated under NSABP 41 protocol. Could not tolerate Taxol plus trastuzumab secondary to heart failure June 2010 to August 2014 Tamoxifen August 2014 to 12/30/2013 Arimidex Tamoxifen 10 daily on 12/30/2013 by Dr. Stephenie Acres DEXA on 09/2013 with osteopenia Breast Cancer INDEX with score of 7.2, 9.4% risk of distant recurrance with a low likelihood of benefit from extended endocrine therapy   CURRENT THERAPY: Observation  INTERVAL HISTORY: Alicia Harrington 83 y.o. female returns for follow-up of a stage II breast cancer of the R breast.   August 10, 2017 Patient is here for further follow-up had a recent ultrasound done 2 weeks ago at outside facility according to husband report was negative.  Patient is being followed for carcinoma of right breast presently under observation.  Continues to have problems with neuropathy and arthritis. MEDICAL HISTORY: Past Medical History:  Diagnosis Date  . Adenocarcinoma, breast ([place])    treated with lumpectomy, node dissection, chemo and RT  . Ataxia   . Breast cancer ([place]) 2009   IDC+DCIS of right breast; triple positive  . Cardiomyopathy 2009   possibly secondary to Adriamycin; EF 40% in 12/09 and 50% 3/10; pulmonary edema in 2009  . Chronic anticoagulation 04/16/2011  . Chronic kidney disease (CKD), stage I    when on diuretic with creatinine of 1.6  . Complication of anesthesia    patient vomits with demerol  . DJD (degenerative joint disease)    right knee and lumbosacral spine  . Hearing impairment   . History of angina 2001   normal cornary arteries in 2001  . Hypertension   . Macular degeneration, dry    + cataracts  . Neuropathy    fingers and toes  . PONV (postoperative nausea and vomiting)   . Retinal macular atrophy   . Sick sinus syndrome (H[facility])    Paroxysmal to permanent AF; initially first degree AV block also noted      has HEARING IMPAIRMENT; HYPERTENSION; Atrial fibrillation ([place]); Chronic anticoagulation; Cardiomyopathy ([place]); Sick sinus syndrome ([place]); Chronic kidney disease (CKD), stage I; Adenocarcinoma, breast ([place]); DJD (degenerative joint disease); History of angina; Macular degeneration, dry; Fasting hyperglycemia; Verruca vulgaris; Encounter for therapeutic drug monitoring; Mitral regurgitation; Abdominal pain; History of breast cancer in female; Family history of breast cancer in female; Genetic testing; and Hx of colonic polyps on her problem list.     is allergic to altace [ramipril].  Ms. Formoso had no medications administered during this visit.  SURGICAL HISTORY: Past Surgical History:  Procedure Laterality Date  . APPENDECTOMY    . CATARACT EXTRACTION W/PHACO  10/11/2012   Procedure: CATARACT EXTRACTION PHACO AND INTRAOCULAR LENS PLACEMENT (IOC);  Surgeon: Williams Che, MD;  Location: [facility];  Service: Ophthalmology;  Laterality: Right;  CDE:10.82  . COLONOSCOPY  02/11/2012   Colonoscopy plus polypectomy x2, [place]  . COLONOSCOPY N/A 03/30/2017   Procedure: COLONOSCOPY;  Surgeon: Rogene Houston, MD;  Location: [facility] SU[facility];  Service: Endoscopy;  Laterality: N/A;  830  . DILATION AND CURETTAGE OF UTERUS    . ESOPHAGEAL DILATION N/A 01/19/2015   Procedure: ESOPHAGEAL DILATION;  Surgeon: Rogene Houston, MD;  Location: [facility];  Service: Endoscopy;  Laterality: N/A;  . ESOPHAGOGASTRODUODENOSCOPY N/A 01/19/2015   Procedure: ESOPHAGOGASTRODUODENOSCOPY (EGD);  Surgeon: Rogene Houston, MD;  Location: [facility];  Service: Endoscopy;  Laterality: N/A;  910  .  MASTECTOMY PARTIAL / LUMPECTOMY W/ AXILLARY LYMPHADENECTOMY  2009   Carcinoma of the breast  . MICRODISCECTOMY LUMBAR  2009   lumbosacral spine  . MIDDLE EAR SURGERY     Left  . POLYPECTOMY  03/30/2017   Procedure: POLYPECTOMY;  Surgeon: Rogene Houston, MD;  Location: [facility];  Service: Endoscopy;;  colon  .  PORT-A-CATH REMOVAL    . TONSILLECTOMY      SOCIAL HISTORY: Social History   Social History  . Marital status: Married    Spouse name: N/A  . Number of children: N/A  . Years of education: N/A   Occupational History  .  Retired    retired   Social History Main Topics  . Smoking status: Former Smoker    Types: Cigarettes    Start date: 11/24/1954    Quit date: 12/23/1955  . Smokeless tobacco: Never Used     Comment: quit at age 89; 0.5 pack per wk for one year  . Alcohol use 0.0 oz/week     Comment: one glass of wine per day  . Drug use: No  . Sexual activity: Not on file   Other Topics Concern  . Not on file   Social History Narrative   Pt lives in [place] with spouse.  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    FAMILY HISTORY: Family History  Problem Relation Age of Onset  . Heart failure Mother   . Bladder Cancer Maternal Uncle        smoker  . Ovarian cancer Maternal Grandmother        dx. 65s; +surgery  . Stroke Brother   . Breast cancer Maternal Aunt        dx. late 50s  . Breast cancer Cousin 38  . Prostate cancer Cousin 69  . Cancer Cousin        unspecified type  . Stroke Paternal Uncle    Review of Systems  Constitutional: Negative for weight loss.  HENT: Negative.   Eyes: Negative.   Respiratory: Positive for shortness of breath.   Cardiovascular: Positive for leg swelling. Negative for chest pain and palpitations.  Gastrointestinal: Negative.  Negative for abdominal pain.  Genitourinary: Negative.   Musculoskeletal: Positive for joint pain (R shoulder).       Lump in right breast  Skin: Negative.   Neurological: Positive for weakness.  Endo/Heme/Allergies: Negative.   Psychiatric/Behavioral: Negative.   All other systems reviewed and are negative. 14 point review of systems was performed and is negative except as detailed under history of present illness and above Alicia Harrington continues to have some tingling numbness in upper and lower  extremities secondary to neuropathy PHYSICAL EXAMINATION ECOG PERFORMANCE STATUS: 1 - Symptomatic but completely ambulatory  There were no vitals filed for this visit. Physical Exam  Constitutional: She is oriented to person, place, and time and well-developed, well-nourished, and in no distress.  HENT:  Head: Normocephalic and atraumatic.  Eyes: Pupils are equal, round, and reactive to light. EOM are normal.  Neck: Normal range of motion. Neck supple.  Cardiovascular: Normal rate, regular rhythm and normal heart sounds.   Pulmonary/Chest: Effort normal and breath sounds normal. Left breast exhibits no mass, no nipple discharge and no skin change.    Abdominal: Soft. Bowel sounds are normal.  Musculoskeletal: Normal range of motion.  Neurological: She is alert and oriented to person, place, and time. Gait normal.  Skin: Skin is warm and dry.  Nursing note and  vitals reviewed.  LABORATORY DATA: I have reviewed the labs below.  CBC    Component Value Date/Time   WBC 3.4 (L) 01/14/2016 1239   RBC 3.81 (L) 01/14/2016 1239   HGB 13.3 01/14/2016 1239   HCT 39.0 01/14/2016 1239   PLT 147 (L) 01/14/2016 1239   MCV 102.4 (H) 01/14/2016 1239   MCH 34.9 (H) 01/14/2016 1239   MCHC 34.1 01/14/2016 1239   RDW 13.7 01/14/2016 1239   LYMPHSABS 0.8 01/14/2016 1239   MONOABS 0.3 01/14/2016 1239   EOSABS 0.1 01/14/2016 1239   BASOSABS 0.0 01/14/2016 1239   CMP     Component Value Date/Time   NA 141 01/14/2016 1239   K 3.7 01/14/2016 1239   CL 105 01/14/2016 1239   CO2 25 01/14/2016 1239   GLUCOSE 103 (H) 01/14/2016 1239   BUN 37 (H) 01/14/2016 1239   CREATININE 1.03 (H) 01/14/2016 1239   CREATININE 0.98 04/07/2013 1154   CALCIUM 9.5 01/14/2016 1239   PROT 6.6 01/14/2016 1239   ALBUMIN 4.1 01/14/2016 1239   AST 37 01/14/2016 1239   ALT 25 01/14/2016 1239   ALKPHOS 155 (H) 01/14/2016 1239   BILITOT 1.3 (H) 01/14/2016 1239   GFRNONAA 50 (L) 01/14/2016 1239   GFRAA 57 (L)  01/14/2016 1239   RADIOLOGY: I have reviewed the images below and agree with the reported results  No new imaging   ASSESSMENT and THERAPY PLAN:  Stage II R breast cancer Treated under NSABP 41 protocol. Could not tolerate Taxol plus trastuzumab secondary to heart failure Osteopenia BCI predictive with low likelihood of benefit from extended adjuvant therapy Macrocytosis with normal B12 and folate Thrombocytopenia Mild hyperbilirubinemia, intermittent Insomnia No evidence of recurrent disease Recent ultrasound done at has been verbally reported to be negative (patient was evaluated for  thickness in the right breast Clinical exam there is no evidence of recurrent disease 10 appointment in one year.  Patient would be followed for one more year after that and will be discharged from our care    Forest Gleason, MD

## 2017-08-10 NOTE — Patient Instructions (Signed)
[place] at [facility] Discharge Instructions  RECOMMENDATIONS MADE BY THE CONSULTANT AND ANY TEST RESULTS WILL BE SENT TO YOUR REFERRING PHYSICIAN.  You were seen today by Dr. Oliva Bustard. Return in 1 year for labs and follow up.   Thank you for choosing [place] at [facility] to provide your oncology and hematology care.  To afford each patient quality time with our provider, please arrive at least 15 minutes before your scheduled appointment time.    If you have a lab appointment with the [facility] please come in thru the  Main Entrance and check in at the main information desk  You need to re-schedule your appointment should you arrive 10 or more minutes late.  We strive to give you quality time with our providers, and arriving late affects you and other patients whose appointments are after yours.  Also, if you no show three or more times for appointments you may be dismissed from the clinic at the providers discretion.     Again, thank you for choosing [facility]  Our hope is that these requests will decrease the amount of time that you wait before being seen by our physicians.       _____________________________________________________________  Should you have questions after your visit to [facility], please contact our office at [phone] between the hours of 8:30 a.m. and 4:30 p.m.  Voicemails left after 4:30 p.m. will not be returned until the following business day.  For prescription refill requests, have your pharmacy contact our office.       Resources For Cancer Patients and their Caregivers ? American Cancer Society: Can assist with transportation, wigs, general needs, runs Look Good Feel Better.        5343075632 ? Cancer Care: Provides financial assistance, online support groups, medication/co-pay assistance.  1-800-813-HOPE (904)227-2825) ? [place] Assists  [place] cancer patients and their families through emotional , educational and financial support.  (636)154-7905 ? [place] DSS Where to apply for food stamps, Medicaid and utility assistance. (909) 739-6723 ? [facility]: Transportation to medical appointments. (934)208-2716 ? Social Security Administration: May apply for disability if have a Stage IV cancer. 907-471-5021 509-700-8856 ? [employer], Disability and Transit Services: Assists with nutrition, care and transit needs. [place] Support Programs: @10RELATIVEDAYS @ > Cancer Support Group  2nd Tuesday of the month 1pm-2pm, Journey Room  > Creative Journey  3rd Tuesday of the month 1130am-1pm, Journey Room  > Look Good Feel Better  1st Wednesday of the month 10am-12 noon, Journey Room (Call [place] to register [phone])

## 2017-09-02 DIAGNOSIS — I481 Persistent atrial fibrillation: Secondary | ICD-10-CM | POA: Diagnosis not present

## 2017-09-02 DIAGNOSIS — Z5181 Encounter for therapeutic drug level monitoring: Secondary | ICD-10-CM | POA: Diagnosis not present

## 2017-09-22 DIAGNOSIS — Z23 Encounter for immunization: Secondary | ICD-10-CM | POA: Diagnosis not present

## 2017-09-30 ENCOUNTER — Ambulatory Visit (INDEPENDENT_AMBULATORY_CARE_PROVIDER_SITE_OTHER): Payer: Medicare Other | Admitting: *Deleted

## 2017-09-30 DIAGNOSIS — Z5181 Encounter for therapeutic drug level monitoring: Secondary | ICD-10-CM | POA: Diagnosis not present

## 2017-09-30 DIAGNOSIS — I4891 Unspecified atrial fibrillation: Secondary | ICD-10-CM | POA: Diagnosis not present

## 2017-09-30 DIAGNOSIS — I480 Paroxysmal atrial fibrillation: Secondary | ICD-10-CM | POA: Diagnosis not present

## 2017-09-30 LAB — POCT INR: INR: 2.9

## 2017-10-05 ENCOUNTER — Ambulatory Visit (HOSPITAL_COMMUNITY): Payer: Medicare Other

## 2017-10-06 DIAGNOSIS — M542 Cervicalgia: Secondary | ICD-10-CM | POA: Diagnosis not present

## 2017-10-06 DIAGNOSIS — M4312 Spondylolisthesis, cervical region: Secondary | ICD-10-CM | POA: Diagnosis not present

## 2017-10-06 DIAGNOSIS — I10 Essential (primary) hypertension: Secondary | ICD-10-CM | POA: Diagnosis not present

## 2017-10-08 ENCOUNTER — Other Ambulatory Visit (HOSPITAL_COMMUNITY): Payer: Self-pay | Admitting: Pulmonary Disease

## 2017-10-08 ENCOUNTER — Telehealth (HOSPITAL_COMMUNITY): Payer: Self-pay

## 2017-10-08 DIAGNOSIS — N281 Cyst of kidney, acquired: Secondary | ICD-10-CM | POA: Diagnosis not present

## 2017-10-08 DIAGNOSIS — M6281 Muscle weakness (generalized): Secondary | ICD-10-CM | POA: Diagnosis not present

## 2017-10-08 DIAGNOSIS — M542 Cervicalgia: Secondary | ICD-10-CM | POA: Diagnosis not present

## 2017-10-08 DIAGNOSIS — I10 Essential (primary) hypertension: Secondary | ICD-10-CM | POA: Diagnosis not present

## 2017-10-08 DIAGNOSIS — R109 Unspecified abdominal pain: Secondary | ICD-10-CM

## 2017-10-08 DIAGNOSIS — M2560 Stiffness of unspecified joint, not elsewhere classified: Secondary | ICD-10-CM | POA: Diagnosis not present

## 2017-10-08 NOTE — Telephone Encounter (Signed)
Patient called stating she wanted to be scheduled for a pelvis US. Her husband states he thinks she may have ascites because her stomach is swelling and she is having trouble buttoning her pants. Asked if she had contacted her PCP and he said no. Asked if she had a gyn MD and he said no but she does have a cancer specialist. Explained to husband that normally, the PCP would order the test and if anything is needed from a cancer perspective than she can be seen at [facility]. He states he will call her PCP, Dr. Willey Blade.

## 2017-10-09 ENCOUNTER — Other Ambulatory Visit (HOSPITAL_COMMUNITY): Payer: Self-pay | Admitting: Pulmonary Disease

## 2017-10-09 ENCOUNTER — Ambulatory Visit (HOSPITAL_COMMUNITY)
Admission: RE | Admit: 2017-10-09 | Discharge: 2017-10-09 | Disposition: A | Discharge status: Home / Self Care | Payer: Medicare Other | Source: Ambulatory Visit | Attending: Pulmonary Disease | Admitting: Pulmonary Disease

## 2017-10-09 DIAGNOSIS — R109 Unspecified abdominal pain: Secondary | ICD-10-CM

## 2017-10-09 DIAGNOSIS — D259 Leiomyoma of uterus, unspecified: Secondary | ICD-10-CM | POA: Insufficient documentation

## 2017-10-13 ENCOUNTER — Other Ambulatory Visit (INDEPENDENT_AMBULATORY_CARE_PROVIDER_SITE_OTHER): Payer: Self-pay | Admitting: Internal Medicine

## 2017-10-13 DIAGNOSIS — M6281 Muscle weakness (generalized): Secondary | ICD-10-CM | POA: Diagnosis not present

## 2017-10-13 DIAGNOSIS — I10 Essential (primary) hypertension: Secondary | ICD-10-CM | POA: Diagnosis not present

## 2017-10-13 DIAGNOSIS — M542 Cervicalgia: Secondary | ICD-10-CM | POA: Diagnosis not present

## 2017-10-13 DIAGNOSIS — M2560 Stiffness of unspecified joint, not elsewhere classified: Secondary | ICD-10-CM | POA: Diagnosis not present

## 2017-10-13 DIAGNOSIS — Z79899 Other long term (current) drug therapy: Secondary | ICD-10-CM | POA: Diagnosis not present

## 2017-10-13 DIAGNOSIS — I5022 Chronic systolic (congestive) heart failure: Secondary | ICD-10-CM | POA: Diagnosis not present

## 2017-10-13 DIAGNOSIS — I482 Chronic atrial fibrillation: Secondary | ICD-10-CM | POA: Diagnosis not present

## 2017-10-19 DIAGNOSIS — M542 Cervicalgia: Secondary | ICD-10-CM | POA: Diagnosis not present

## 2017-10-19 DIAGNOSIS — I10 Essential (primary) hypertension: Secondary | ICD-10-CM | POA: Diagnosis not present

## 2017-10-19 DIAGNOSIS — M2560 Stiffness of unspecified joint, not elsewhere classified: Secondary | ICD-10-CM | POA: Diagnosis not present

## 2017-10-19 DIAGNOSIS — M6281 Muscle weakness (generalized): Secondary | ICD-10-CM | POA: Diagnosis not present

## 2017-10-21 DIAGNOSIS — M6281 Muscle weakness (generalized): Secondary | ICD-10-CM | POA: Diagnosis not present

## 2017-10-21 DIAGNOSIS — M542 Cervicalgia: Secondary | ICD-10-CM | POA: Diagnosis not present

## 2017-10-21 DIAGNOSIS — M2560 Stiffness of unspecified joint, not elsewhere classified: Secondary | ICD-10-CM | POA: Diagnosis not present

## 2017-10-21 DIAGNOSIS — I10 Essential (primary) hypertension: Secondary | ICD-10-CM | POA: Diagnosis not present

## 2017-10-22 DIAGNOSIS — I482 Chronic atrial fibrillation: Secondary | ICD-10-CM | POA: Diagnosis not present

## 2017-10-22 DIAGNOSIS — I5022 Chronic systolic (congestive) heart failure: Secondary | ICD-10-CM | POA: Diagnosis not present

## 2017-10-22 DIAGNOSIS — N183 Chronic kidney disease, stage 3 (moderate): Secondary | ICD-10-CM | POA: Diagnosis not present

## 2017-10-26 ENCOUNTER — Ambulatory Visit (INDEPENDENT_AMBULATORY_CARE_PROVIDER_SITE_OTHER): Payer: Medicare Other | Admitting: *Deleted

## 2017-10-26 DIAGNOSIS — Z5181 Encounter for therapeutic drug level monitoring: Secondary | ICD-10-CM

## 2017-10-26 DIAGNOSIS — I480 Paroxysmal atrial fibrillation: Secondary | ICD-10-CM | POA: Diagnosis not present

## 2017-10-26 DIAGNOSIS — I10 Essential (primary) hypertension: Secondary | ICD-10-CM | POA: Diagnosis not present

## 2017-10-26 DIAGNOSIS — M2560 Stiffness of unspecified joint, not elsewhere classified: Secondary | ICD-10-CM | POA: Diagnosis not present

## 2017-10-26 DIAGNOSIS — M6281 Muscle weakness (generalized): Secondary | ICD-10-CM | POA: Diagnosis not present

## 2017-10-26 DIAGNOSIS — M542 Cervicalgia: Secondary | ICD-10-CM | POA: Diagnosis not present

## 2017-10-26 LAB — POCT INR: INR: 2.8

## 2017-10-29 DIAGNOSIS — M542 Cervicalgia: Secondary | ICD-10-CM | POA: Diagnosis not present

## 2017-10-29 DIAGNOSIS — I10 Essential (primary) hypertension: Secondary | ICD-10-CM | POA: Diagnosis not present

## 2017-10-29 DIAGNOSIS — M6281 Muscle weakness (generalized): Secondary | ICD-10-CM | POA: Diagnosis not present

## 2017-10-29 DIAGNOSIS — M2560 Stiffness of unspecified joint, not elsewhere classified: Secondary | ICD-10-CM | POA: Diagnosis not present

## 2017-11-03 DIAGNOSIS — M542 Cervicalgia: Secondary | ICD-10-CM | POA: Diagnosis not present

## 2017-11-03 DIAGNOSIS — M2560 Stiffness of unspecified joint, not elsewhere classified: Secondary | ICD-10-CM | POA: Diagnosis not present

## 2017-11-03 DIAGNOSIS — M6281 Muscle weakness (generalized): Secondary | ICD-10-CM | POA: Diagnosis not present

## 2017-11-03 DIAGNOSIS — I10 Essential (primary) hypertension: Secondary | ICD-10-CM | POA: Diagnosis not present

## 2017-11-04 DIAGNOSIS — H2512 Age-related nuclear cataract, left eye: Secondary | ICD-10-CM | POA: Diagnosis not present

## 2017-11-04 DIAGNOSIS — Z961 Presence of intraocular lens: Secondary | ICD-10-CM | POA: Diagnosis not present

## 2017-11-04 DIAGNOSIS — H401132 Primary open-angle glaucoma, bilateral, moderate stage: Secondary | ICD-10-CM | POA: Diagnosis not present

## 2017-11-04 DIAGNOSIS — H04123 Dry eye syndrome of bilateral lacrimal glands: Secondary | ICD-10-CM | POA: Diagnosis not present

## 2017-11-09 DIAGNOSIS — M6281 Muscle weakness (generalized): Secondary | ICD-10-CM | POA: Diagnosis not present

## 2017-11-09 DIAGNOSIS — M542 Cervicalgia: Secondary | ICD-10-CM | POA: Diagnosis not present

## 2017-11-09 DIAGNOSIS — M2560 Stiffness of unspecified joint, not elsewhere classified: Secondary | ICD-10-CM | POA: Diagnosis not present

## 2017-11-09 DIAGNOSIS — I10 Essential (primary) hypertension: Secondary | ICD-10-CM | POA: Diagnosis not present

## 2017-11-11 ENCOUNTER — Ambulatory Visit (INDEPENDENT_AMBULATORY_CARE_PROVIDER_SITE_OTHER): Payer: Medicare Other | Admitting: Internal Medicine

## 2017-11-12 DIAGNOSIS — M2560 Stiffness of unspecified joint, not elsewhere classified: Secondary | ICD-10-CM | POA: Diagnosis not present

## 2017-11-12 DIAGNOSIS — I10 Essential (primary) hypertension: Secondary | ICD-10-CM | POA: Diagnosis not present

## 2017-11-12 DIAGNOSIS — M6281 Muscle weakness (generalized): Secondary | ICD-10-CM | POA: Diagnosis not present

## 2017-11-12 DIAGNOSIS — M542 Cervicalgia: Secondary | ICD-10-CM | POA: Diagnosis not present

## 2017-11-18 ENCOUNTER — Ambulatory Visit (INDEPENDENT_AMBULATORY_CARE_PROVIDER_SITE_OTHER): Payer: Medicare Other | Admitting: *Deleted

## 2017-11-18 DIAGNOSIS — I480 Paroxysmal atrial fibrillation: Secondary | ICD-10-CM | POA: Diagnosis not present

## 2017-11-18 DIAGNOSIS — Z5181 Encounter for therapeutic drug level monitoring: Secondary | ICD-10-CM | POA: Diagnosis not present

## 2017-11-18 LAB — POCT INR: INR: 3

## 2017-11-19 DIAGNOSIS — M542 Cervicalgia: Secondary | ICD-10-CM | POA: Diagnosis not present

## 2017-11-19 DIAGNOSIS — I10 Essential (primary) hypertension: Secondary | ICD-10-CM | POA: Diagnosis not present

## 2017-11-19 DIAGNOSIS — M2560 Stiffness of unspecified joint, not elsewhere classified: Secondary | ICD-10-CM | POA: Diagnosis not present

## 2017-11-19 DIAGNOSIS — M6281 Muscle weakness (generalized): Secondary | ICD-10-CM | POA: Diagnosis not present

## 2017-12-02 DIAGNOSIS — G319 Degenerative disease of nervous system, unspecified: Secondary | ICD-10-CM | POA: Diagnosis not present

## 2017-12-02 DIAGNOSIS — R9082 White matter disease, unspecified: Secondary | ICD-10-CM | POA: Diagnosis not present

## 2017-12-02 DIAGNOSIS — R9089 Other abnormal findings on diagnostic imaging of central nervous system: Secondary | ICD-10-CM | POA: Diagnosis not present

## 2017-12-09 DIAGNOSIS — Z5181 Encounter for therapeutic drug level monitoring: Secondary | ICD-10-CM | POA: Diagnosis not present

## 2017-12-09 DIAGNOSIS — I480 Paroxysmal atrial fibrillation: Secondary | ICD-10-CM | POA: Diagnosis not present

## 2017-12-10 ENCOUNTER — Ambulatory Visit (INDEPENDENT_AMBULATORY_CARE_PROVIDER_SITE_OTHER): Payer: Medicare Other | Admitting: *Deleted

## 2017-12-10 DIAGNOSIS — I4891 Unspecified atrial fibrillation: Secondary | ICD-10-CM

## 2017-12-10 DIAGNOSIS — Z5181 Encounter for therapeutic drug level monitoring: Secondary | ICD-10-CM

## 2017-12-10 LAB — PROTIME-INR: INR: 2 — AB (ref 0.9–1.1)

## 2017-12-10 NOTE — Patient Instructions (Signed)
Take coumadin 1 1/2 tablets tonight then resume 1 tablet daily .   Will recheck INR in 4 weeks in [place]

## 2017-12-21 ENCOUNTER — Other Ambulatory Visit: Payer: Self-pay | Admitting: *Deleted

## 2017-12-21 DIAGNOSIS — Z5181 Encounter for therapeutic drug level monitoring: Secondary | ICD-10-CM

## 2017-12-21 DIAGNOSIS — I4891 Unspecified atrial fibrillation: Secondary | ICD-10-CM

## 2017-12-30 ENCOUNTER — Telehealth: Payer: Self-pay | Admitting: *Deleted

## 2017-12-30 MED ORDER — METOPROLOL SUCCINATE ER 50 MG PO TB24: 50.0000 mg | ORAL_TABLET | Freq: Every day | ORAL | 0 refills | Status: DC

## 2017-12-30 MED ORDER — FUROSEMIDE 20 MG PO TABS: ORAL_TABLET | ORAL | 0 refills | Status: DC

## 2017-12-30 MED ORDER — VALSARTAN 80 MG PO TABS: 80.0000 mg | ORAL_TABLET | Freq: Every day | ORAL | 0 refills | Status: DC

## 2017-12-30 NOTE — Telephone Encounter (Signed)
Prescriptions sent to [place], [place]

## 2018-01-01 DIAGNOSIS — Z5181 Encounter for therapeutic drug level monitoring: Secondary | ICD-10-CM | POA: Diagnosis not present

## 2018-01-01 DIAGNOSIS — I4891 Unspecified atrial fibrillation: Secondary | ICD-10-CM | POA: Diagnosis not present

## 2018-01-01 LAB — PROTIME-INR: INR: 2.1 — AB (ref 0.9–1.1)

## 2018-01-04 ENCOUNTER — Ambulatory Visit (INDEPENDENT_AMBULATORY_CARE_PROVIDER_SITE_OTHER): Payer: Medicare Other | Admitting: *Deleted

## 2018-01-04 DIAGNOSIS — Z5181 Encounter for therapeutic drug level monitoring: Secondary | ICD-10-CM | POA: Diagnosis not present

## 2018-01-04 DIAGNOSIS — I4891 Unspecified atrial fibrillation: Secondary | ICD-10-CM

## 2018-01-04 NOTE — Patient Instructions (Signed)
Continue coumadin 1 tablet daily .   Will recheck INR in 4 weeks in [place] Pt and spouse notified of results and instructions and they verbalized understanding.

## 2018-01-21 ENCOUNTER — Telehealth: Payer: Self-pay | Admitting: *Deleted

## 2018-01-21 MED ORDER — WARFARIN SODIUM 5 MG PO TABS: 5.0000 mg | ORAL_TABLET | Freq: Every evening | ORAL | 3 refills | Status: DC

## 2018-01-21 NOTE — Telephone Encounter (Signed)
Warfarin Rx sent.lr

## 2018-02-05 DIAGNOSIS — I4891 Unspecified atrial fibrillation: Secondary | ICD-10-CM | POA: Diagnosis not present

## 2018-02-05 DIAGNOSIS — Z5181 Encounter for therapeutic drug level monitoring: Secondary | ICD-10-CM | POA: Diagnosis not present

## 2018-02-05 LAB — POCT INR: INR: 4.6

## 2018-02-08 ENCOUNTER — Ambulatory Visit (INDEPENDENT_AMBULATORY_CARE_PROVIDER_SITE_OTHER): Payer: Medicare Other | Admitting: *Deleted

## 2018-02-08 ENCOUNTER — Telehealth: Payer: Self-pay | Admitting: *Deleted

## 2018-02-08 DIAGNOSIS — Z5181 Encounter for therapeutic drug level monitoring: Secondary | ICD-10-CM

## 2018-02-08 DIAGNOSIS — I4891 Unspecified atrial fibrillation: Secondary | ICD-10-CM | POA: Diagnosis not present

## 2018-02-08 NOTE — Patient Instructions (Signed)
Pt fell last Wednesday.  Cut shin of leg on dishwasher door.  Had INR check on 3/15.  INR was 4.6  Took coumadin 5mg  on 3/15, none 3/16, 5mg  3/17.  Needs instruction.  Leg still bleeding Pt to hold coumadin tonight and get INR checked tomorrow and fax results to me.  Will adjust coumadin from there. Pt and spouse verbalized understanding.

## 2018-02-08 NOTE — Telephone Encounter (Signed)
Done.  See coumadin note. 

## 2018-02-08 NOTE — Telephone Encounter (Signed)
INR 4.6 per husband, can be reached @ [phone]

## 2018-02-09 DIAGNOSIS — Z5181 Encounter for therapeutic drug level monitoring: Secondary | ICD-10-CM | POA: Diagnosis not present

## 2018-02-09 DIAGNOSIS — I4891 Unspecified atrial fibrillation: Secondary | ICD-10-CM | POA: Diagnosis not present

## 2018-02-09 LAB — PROTIME-INR: INR: 2.3 — AB (ref 0.9–1.1)

## 2018-02-10 ENCOUNTER — Ambulatory Visit (INDEPENDENT_AMBULATORY_CARE_PROVIDER_SITE_OTHER): Payer: Medicare Other | Admitting: *Deleted

## 2018-02-10 DIAGNOSIS — Z5181 Encounter for therapeutic drug level monitoring: Secondary | ICD-10-CM

## 2018-02-10 DIAGNOSIS — I4891 Unspecified atrial fibrillation: Secondary | ICD-10-CM

## 2018-02-10 NOTE — Patient Instructions (Signed)
Continue coumadin 5mg  daily.  Recheck in 2 weeks in office  Pt and spouse verbalized understanding.

## 2018-02-24 ENCOUNTER — Ambulatory Visit (INDEPENDENT_AMBULATORY_CARE_PROVIDER_SITE_OTHER): Payer: Medicare Other | Admitting: *Deleted

## 2018-02-24 DIAGNOSIS — I4891 Unspecified atrial fibrillation: Secondary | ICD-10-CM | POA: Diagnosis not present

## 2018-02-24 DIAGNOSIS — I480 Paroxysmal atrial fibrillation: Secondary | ICD-10-CM

## 2018-02-24 DIAGNOSIS — Z5181 Encounter for therapeutic drug level monitoring: Secondary | ICD-10-CM | POA: Diagnosis not present

## 2018-02-24 LAB — POCT INR: INR: 2.7

## 2018-02-24 NOTE — Patient Instructions (Signed)
Continue coumadin 5mg daily Recheck in 3 weeks 

## 2018-03-03 DIAGNOSIS — L235 Allergic contact dermatitis due to other chemical products: Secondary | ICD-10-CM | POA: Diagnosis not present

## 2018-03-03 DIAGNOSIS — L570 Actinic keratosis: Secondary | ICD-10-CM | POA: Diagnosis not present

## 2018-03-03 DIAGNOSIS — D485 Neoplasm of uncertain behavior of skin: Secondary | ICD-10-CM | POA: Diagnosis not present

## 2018-03-04 DIAGNOSIS — Z853 Personal history of malignant neoplasm of breast: Secondary | ICD-10-CM | POA: Diagnosis not present

## 2018-03-04 DIAGNOSIS — R922 Inconclusive mammogram: Secondary | ICD-10-CM | POA: Diagnosis not present

## 2018-03-08 DIAGNOSIS — Z961 Presence of intraocular lens: Secondary | ICD-10-CM | POA: Diagnosis not present

## 2018-03-08 DIAGNOSIS — H04123 Dry eye syndrome of bilateral lacrimal glands: Secondary | ICD-10-CM | POA: Diagnosis not present

## 2018-03-08 DIAGNOSIS — H401132 Primary open-angle glaucoma, bilateral, moderate stage: Secondary | ICD-10-CM | POA: Diagnosis not present

## 2018-03-08 DIAGNOSIS — H2512 Age-related nuclear cataract, left eye: Secondary | ICD-10-CM | POA: Diagnosis not present

## 2018-03-12 DIAGNOSIS — I5022 Chronic systolic (congestive) heart failure: Secondary | ICD-10-CM | POA: Diagnosis not present

## 2018-03-12 DIAGNOSIS — N183 Chronic kidney disease, stage 3 (moderate): Secondary | ICD-10-CM | POA: Diagnosis not present

## 2018-03-12 DIAGNOSIS — I482 Chronic atrial fibrillation: Secondary | ICD-10-CM | POA: Diagnosis not present

## 2018-03-12 DIAGNOSIS — Z79899 Other long term (current) drug therapy: Secondary | ICD-10-CM | POA: Diagnosis not present

## 2018-03-15 ENCOUNTER — Ambulatory Visit (INDEPENDENT_AMBULATORY_CARE_PROVIDER_SITE_OTHER): Payer: Medicare Other | Admitting: *Deleted

## 2018-03-15 DIAGNOSIS — I4891 Unspecified atrial fibrillation: Secondary | ICD-10-CM | POA: Diagnosis not present

## 2018-03-15 DIAGNOSIS — Z5181 Encounter for therapeutic drug level monitoring: Secondary | ICD-10-CM

## 2018-03-15 LAB — POCT INR: INR: 4.8

## 2018-03-15 NOTE — Patient Instructions (Signed)
Hold coumadin tonight, take 1/2 tablet tomorrow night then resume 1 tablet daily. Recheck 03/24/18

## 2018-03-16 ENCOUNTER — Telehealth: Payer: Self-pay | Admitting: *Deleted

## 2018-03-16 NOTE — Telephone Encounter (Signed)
Pt was started on doxycycline 100mg  bid x 7 days today.  Told pt to decrease coumadin to 5mg  daily except 2.5mg  on Tuesdays and Fridays until finished with Abx.  Has appt for INR check on 03/24/18.  Spouse verbalized understanding.

## 2018-03-19 DIAGNOSIS — I5022 Chronic systolic (congestive) heart failure: Secondary | ICD-10-CM | POA: Diagnosis not present

## 2018-03-19 DIAGNOSIS — I482 Chronic atrial fibrillation: Secondary | ICD-10-CM | POA: Diagnosis not present

## 2018-03-19 DIAGNOSIS — Z6824 Body mass index (BMI) 24.0-24.9, adult: Secondary | ICD-10-CM | POA: Diagnosis not present

## 2018-03-24 ENCOUNTER — Ambulatory Visit (INDEPENDENT_AMBULATORY_CARE_PROVIDER_SITE_OTHER): Payer: Medicare Other | Admitting: *Deleted

## 2018-03-24 DIAGNOSIS — Z5181 Encounter for therapeutic drug level monitoring: Secondary | ICD-10-CM | POA: Diagnosis not present

## 2018-03-24 DIAGNOSIS — I4891 Unspecified atrial fibrillation: Secondary | ICD-10-CM | POA: Diagnosis not present

## 2018-03-24 LAB — POCT INR: INR: 3.9

## 2018-03-24 NOTE — Patient Instructions (Signed)
Hold coumadin tonight then resume 1 tablet daily. Recheck 04/12/18

## 2018-03-27 ENCOUNTER — Other Ambulatory Visit: Payer: Self-pay | Admitting: Cardiology

## 2018-04-07 ENCOUNTER — Other Ambulatory Visit (INDEPENDENT_AMBULATORY_CARE_PROVIDER_SITE_OTHER): Payer: Self-pay | Admitting: Internal Medicine

## 2018-04-09 DIAGNOSIS — M542 Cervicalgia: Secondary | ICD-10-CM | POA: Diagnosis not present

## 2018-04-12 ENCOUNTER — Ambulatory Visit (INDEPENDENT_AMBULATORY_CARE_PROVIDER_SITE_OTHER): Payer: Medicare Other | Admitting: *Deleted

## 2018-04-12 DIAGNOSIS — Z5181 Encounter for therapeutic drug level monitoring: Secondary | ICD-10-CM | POA: Diagnosis not present

## 2018-04-12 DIAGNOSIS — I4891 Unspecified atrial fibrillation: Secondary | ICD-10-CM

## 2018-04-12 LAB — POCT INR: INR: 3.3

## 2018-04-12 MED ORDER — FUROSEMIDE 20 MG PO TABS: ORAL_TABLET | ORAL | 1 refills | Status: DC

## 2018-04-12 NOTE — Patient Instructions (Signed)
Decrease coumadin to 1 tablet daily except 1/2 tablet on Mondays Recheck week of 05/03/18 Will be in [place] for 5 weeks

## 2018-05-03 DIAGNOSIS — I4891 Unspecified atrial fibrillation: Secondary | ICD-10-CM | POA: Diagnosis not present

## 2018-05-03 DIAGNOSIS — Z5181 Encounter for therapeutic drug level monitoring: Secondary | ICD-10-CM | POA: Diagnosis not present

## 2018-05-03 LAB — PROTIME-INR: INR: 2.7 — AB (ref 0.9–1.1)

## 2018-05-04 ENCOUNTER — Ambulatory Visit (INDEPENDENT_AMBULATORY_CARE_PROVIDER_SITE_OTHER): Payer: Medicare Other | Admitting: *Deleted

## 2018-05-04 DIAGNOSIS — I4891 Unspecified atrial fibrillation: Secondary | ICD-10-CM

## 2018-05-04 DIAGNOSIS — Z5181 Encounter for therapeutic drug level monitoring: Secondary | ICD-10-CM | POA: Diagnosis not present

## 2018-05-04 NOTE — Patient Instructions (Signed)
Continue coumadin 1 tablet daily except 1/2 tablet on Mondays Recheck  05/24/18 Coming home from [facility] 6/26/

## 2018-05-21 ENCOUNTER — Other Ambulatory Visit (HOSPITAL_COMMUNITY): Payer: Self-pay | Admitting: *Deleted

## 2018-05-24 ENCOUNTER — Ambulatory Visit (INDEPENDENT_AMBULATORY_CARE_PROVIDER_SITE_OTHER): Payer: Medicare Other | Admitting: *Deleted

## 2018-05-24 DIAGNOSIS — Z5181 Encounter for therapeutic drug level monitoring: Secondary | ICD-10-CM

## 2018-05-24 DIAGNOSIS — I4891 Unspecified atrial fibrillation: Secondary | ICD-10-CM

## 2018-05-24 LAB — POCT INR: INR: 2.7 (ref 2.0–3.0)

## 2018-05-24 NOTE — Patient Instructions (Signed)
Continue coumadin 1 tablet daily except 1/2 tablet on Mondays Recheck  4 weeks

## 2018-06-02 DIAGNOSIS — L821 Other seborrheic keratosis: Secondary | ICD-10-CM | POA: Diagnosis not present

## 2018-06-02 DIAGNOSIS — D1801 Hemangioma of skin and subcutaneous tissue: Secondary | ICD-10-CM | POA: Diagnosis not present

## 2018-06-02 DIAGNOSIS — L814 Other melanin hyperpigmentation: Secondary | ICD-10-CM | POA: Diagnosis not present

## 2018-06-21 ENCOUNTER — Ambulatory Visit (INDEPENDENT_AMBULATORY_CARE_PROVIDER_SITE_OTHER): Payer: Medicare Other | Admitting: *Deleted

## 2018-06-21 DIAGNOSIS — I4891 Unspecified atrial fibrillation: Secondary | ICD-10-CM

## 2018-06-21 DIAGNOSIS — Z5181 Encounter for therapeutic drug level monitoring: Secondary | ICD-10-CM | POA: Diagnosis not present

## 2018-06-21 LAB — POCT INR: INR: 3.2 — AB (ref 2.0–3.0)

## 2018-06-21 NOTE — Patient Instructions (Signed)
Hold coumadin tonight then resume 1 tablet daily except 1/2 tablet on Mondays Recheck  4 weeks

## 2018-07-12 DIAGNOSIS — Z79899 Other long term (current) drug therapy: Secondary | ICD-10-CM | POA: Diagnosis not present

## 2018-07-12 DIAGNOSIS — Z961 Presence of intraocular lens: Secondary | ICD-10-CM | POA: Diagnosis not present

## 2018-07-12 DIAGNOSIS — H2512 Age-related nuclear cataract, left eye: Secondary | ICD-10-CM | POA: Diagnosis not present

## 2018-07-12 DIAGNOSIS — I482 Chronic atrial fibrillation: Secondary | ICD-10-CM | POA: Diagnosis not present

## 2018-07-12 DIAGNOSIS — H04123 Dry eye syndrome of bilateral lacrimal glands: Secondary | ICD-10-CM | POA: Diagnosis not present

## 2018-07-12 DIAGNOSIS — I5022 Chronic systolic (congestive) heart failure: Secondary | ICD-10-CM | POA: Diagnosis not present

## 2018-07-12 DIAGNOSIS — H401132 Primary open-angle glaucoma, bilateral, moderate stage: Secondary | ICD-10-CM | POA: Diagnosis not present

## 2018-07-20 ENCOUNTER — Ambulatory Visit: Payer: Medicare Other | Admitting: Cardiology

## 2018-07-21 ENCOUNTER — Ambulatory Visit (INDEPENDENT_AMBULATORY_CARE_PROVIDER_SITE_OTHER): Payer: Medicare Other | Admitting: *Deleted

## 2018-07-21 DIAGNOSIS — I4891 Unspecified atrial fibrillation: Secondary | ICD-10-CM | POA: Diagnosis not present

## 2018-07-21 DIAGNOSIS — Z5181 Encounter for therapeutic drug level monitoring: Secondary | ICD-10-CM

## 2018-07-21 LAB — POCT INR: INR: 3 (ref 2.0–3.0)

## 2018-07-21 NOTE — Patient Instructions (Signed)
Take coumadin 1/2 tablet tonight then resume 1 tablet daily except 1/2 tablet on Mondays Recheck  4 weeks

## 2018-07-23 DIAGNOSIS — I5022 Chronic systolic (congestive) heart failure: Secondary | ICD-10-CM | POA: Diagnosis not present

## 2018-07-23 DIAGNOSIS — I482 Chronic atrial fibrillation: Secondary | ICD-10-CM | POA: Diagnosis not present

## 2018-07-27 DIAGNOSIS — H26491 Other secondary cataract, right eye: Secondary | ICD-10-CM | POA: Diagnosis not present

## 2018-07-29 ENCOUNTER — Other Ambulatory Visit (HOSPITAL_COMMUNITY): Payer: Medicare Other

## 2018-08-05 ENCOUNTER — Ambulatory Visit (HOSPITAL_COMMUNITY): Payer: Medicare Other | Admitting: Hematology

## 2018-08-05 ENCOUNTER — Other Ambulatory Visit (HOSPITAL_COMMUNITY): Payer: Medicare Other

## 2018-08-06 ENCOUNTER — Inpatient Hospital Stay (HOSPITAL_COMMUNITY): Payer: Medicare Other | Attending: Internal Medicine | Admitting: Internal Medicine

## 2018-08-06 ENCOUNTER — Encounter (HOSPITAL_COMMUNITY): Payer: Self-pay | Admitting: Internal Medicine

## 2018-08-06 VITALS — BP 123/78 | HR 75 | Temp 97.9°F | Resp 18 | Wt 122.9 lb

## 2018-08-06 DIAGNOSIS — H6120 Impacted cerumen, unspecified ear: Secondary | ICD-10-CM | POA: Insufficient documentation

## 2018-08-06 DIAGNOSIS — Z853 Personal history of malignant neoplasm of breast: Secondary | ICD-10-CM | POA: Diagnosis not present

## 2018-08-06 DIAGNOSIS — I482 Chronic atrial fibrillation: Secondary | ICD-10-CM | POA: Diagnosis not present

## 2018-08-06 DIAGNOSIS — C50911 Malignant neoplasm of unspecified site of right female breast: Secondary | ICD-10-CM

## 2018-08-06 DIAGNOSIS — Z87891 Personal history of nicotine dependence: Secondary | ICD-10-CM

## 2018-08-06 DIAGNOSIS — I130 Hypertensive heart and chronic kidney disease with heart failure and stage 1 through stage 4 chronic kidney disease, or unspecified chronic kidney disease: Secondary | ICD-10-CM | POA: Diagnosis not present

## 2018-08-06 DIAGNOSIS — Z7901 Long term (current) use of anticoagulants: Secondary | ICD-10-CM | POA: Insufficient documentation

## 2018-08-06 NOTE — Progress Notes (Signed)
Diagnosis Adenocarcinoma of right breast ([place]) - Plan: MM DIAG BREAST TOMO BILATERAL  Staging Cancer Staging No matching staging information was found for the patient.  Assessment and Plan:  1.  Stage II R breast cancer.  Pt was treated under NSABP 41 protocol. Could not tolerate Taxol plus trastuzumab secondary to heart failure.  BCI predictive with low likelihood of benefit from extended adjuvant therapy.  Pt had bilateral diagnostic mammogram done 03/04/2018 that showed no evidence of malignancy.  Mammogram in 1 year recommended.   Pt will be 10 years out from diagnosis in 11/2018.  She is given the option of ongoing follow-up at [facility] if desired or follow-up with Dr. Willey Blade.  Pt reports she will follow-up with Dr. Willey Blade and continue mammogram evaluation as recommended.    2.  Ear "stopped up.".  Pt uses hearing aids.  Exam shows mild cerumen.  Follow-up with ENT if ongoing problems.    3.  HTN.  BP is 123/78.  Follow-up with PCP as directed.    4.  Atrial fibrillation.  Pt on coumadin.  Follow-up with Dr. Willey Blade.  INR 3 on blood work done 07/21/2018.    Interval History: Historical data obtained from note dated 07/31/2017.  84 yr old female with  Stage II R breast cancer diagnosed in 11/2008.  Pt was treated under NSABP 41 protocol. Could not tolerate Taxol plus trastuzumab secondary to heart failure.  BCI predictive with low likelihood of benefit from extended adjuvant therapy.   Current Status:  Pt is seen today for follow-up.  She had recent mammogram.  She is complaining of ear feeling stopped up.       Adenocarcinoma, breast ([place])   01/17/2015 Survivorship    BCI shows a high risk of later recurrence (9.4% between years 5-10) and low likelihood of benefit from extended endocrine therapy .  Without extended endocrine therapy, patient has a 13% risk of recurrence and 9% risk of recurrence with extended therapy.      Problem List Patient Active Problem List   Diagnosis Date Noted  . Hx  of colonic polyps [Z86.010] 03/12/2017  . Genetic testing [Z13.79] 08/13/2015  . History of breast cancer in female [Z85.3] 07/26/2015  . Family history of breast cancer in female [Z80.3] 07/26/2015  . Abdominal pain [R10.9] 01/11/2015  . Mitral regurgitation [I34.0] 02/21/2014  . Encounter for therapeutic drug monitoring [Z51.81] 01/02/2014  . Verruca vulgaris [B07.9] 12/30/2013  . Fasting hyperglycemia [R73.01] 03/30/2012  . Cardiomyopathy ([place]) [I42.9]   . Sick sinus syndrome ([place]) [I49.5]   . Chronic kidney disease (CKD), stage I [N18.1]   . Adenocarcinoma, breast ([place]) [C50.919]   . DJD (degenerative joint disease) [M19.90]   . History of angina [Z86.79]   . Macular degeneration, dry [H35.3190]   . Chronic anticoagulation [Z79.01] 04/16/2011  . Atrial fibrillation ([place]) [I48.91] 02/21/2011  . HEARING IMPAIRMENT [H91.90] 06/01/2009  . HYPERTENSION [I10] 06/01/2009    Past Medical History Past Medical History:  Diagnosis Date  . Adenocarcinoma, breast ([place])    treated with lumpectomy, node dissection, chemo and RT  . Ataxia   . Breast cancer ([place]) 2009   IDC+DCIS of right breast; triple positive  . Cardiomyopathy 2009   possibly secondary to Adriamycin; EF 40% in 12/09 and 50% 3/10; pulmonary edema in 2009  . Chronic anticoagulation 04/16/2011  . Chronic kidney disease (CKD), stage I    when on diuretic with creatinine of 1.6  . Complication of anesthesia    patient vomits  with demerol  . DJD (degenerative joint disease)    right knee and lumbosacral spine  . Hearing impairment   . History of angina 2001   normal cornary arteries in 2001  . Hypertension   . Macular degeneration, dry    + cataracts  . Neuropathy    fingers and toes  . PONV (postoperative nausea and vomiting)   . Retinal macular atrophy   . Sick sinus syndrome ([facility])    Paroxysmal to permanent AF; initially first degree AV block also noted    Past Surgical History Past Surgical History:   Procedure Laterality Date  . APPENDECTOMY    . CATARACT EXTRACTION W/PHACO  10/11/2012   Procedure: CATARACT EXTRACTION PHACO AND INTRAOCULAR LENS PLACEMENT (IOC);  Surgeon: Williams Che, MD;  Location: [facility];  Service: Ophthalmology;  Laterality: Right;  CDE:10.82  . COLONOSCOPY  02/11/2012   Colonoscopy plus polypectomy x2, Rehman  . COLONOSCOPY N/A 03/30/2017   Procedure: COLONOSCOPY;  Surgeon: Rogene Houston, MD;  Location: [facility];  Service: Endoscopy;  Laterality: N/A;  830  . DILATION AND CURETTAGE OF UTERUS    . ESOPHAGEAL DILATION N/A 01/19/2015   Procedure: ESOPHAGEAL DILATION;  Surgeon: Rogene Houston, MD;  Location: [facility];  Service: Endoscopy;  Laterality: N/A;  . ESOPHAGOGASTRODUODENOSCOPY N/A 01/19/2015   Procedure: ESOPHAGOGASTRODUODENOSCOPY (EGD);  Surgeon: Rogene Houston, MD;  Location: [facility];  Service: Endoscopy;  Laterality: N/A;  910  . MASTECTOMY PARTIAL / LUMPECTOMY W/ AXILLARY LYMPHADENECTOMY  2009   Carcinoma of the breast  . MICRODISCECTOMY LUMBAR  2009   lumbosacral spine  . MIDDLE EAR SURGERY     Left  . POLYPECTOMY  03/30/2017   Procedure: POLYPECTOMY;  Surgeon: Rogene Houston, MD;  [facility]: [facility];  Service: Endoscopy;;  colon  . PORT-A-CATH REMOVAL    . TONSILLECTOMY      Family History Family History  Problem Relation Age of Onset  . Heart failure Mother   . Bladder Cancer Maternal Uncle        smoker  . Ovarian cancer Maternal Grandmother        dx. 73s; +surgery  . Stroke Brother   . Breast cancer Maternal Aunt        dx. late 74s  . Breast cancer Cousin 72  . Prostate cancer Cousin 30  . Cancer Cousin        unspecified type  . Stroke Paternal Uncle      Social History  reports that she quit smoking about 62 years ago. Her smoking use included cigarettes. She started smoking about 63 years ago. She has never used smokeless tobacco. She reports that she drinks alcohol. She reports that she does not use  drugs.  Medications  Current Outpatient Medications:  .  albuterol (PROVENTIL HFA;VENTOLIN HFA) 108 (90 BASE) MCG/ACT inhaler, Inhale 1-2 puffs into the lungs every 6 (six) hours as needed for wheezing or shortness of breath. (Patient not taking: Reported on 08/10/2017), Disp: 1 Inhaler, Rfl: 2 .  cholecalciferol (VITAMIN D) 1000 UNITS tablet, Take 1,000 Units by mouth daily.  , Disp: , Rfl:  .  Emollient (ROC RETINOL CORREXION) CREA, Apply 1 application topically every other day. Apply to face at night, Disp: , Rfl:  .  furosemide (LASIX) 20 MG tablet, Take 3 tablets daily in the morning or as needed, Disp: 90 tablet, Rfl: 1 .  hyoscyamine (LEVSIN SL) 0.125 MG SL tablet, Place 1 tablet (0.125  mg total) under the tongue 2 (two) times daily as needed. (Patient taking differently: Place 0.125 mg under the tongue daily. ), Disp: 180 tablet, Rfl: 1 .  hyoscyamine (LEVSIN SL) 0.125 MG SL tablet, DISSOLVE 1 TABLET UNDER THE TONGUE TWICE DAILY AS NEEDED., Disp: 180 tablet, Rfl: 1 .  latanoprost (XALATAN) 0.005 % ophthalmic solution, Place 1 drop into both eyes at bedtime. , Disp: , Rfl: 3 .  metoprolol succinate (TOPROL-XL) 50 MG 24 hr tablet, TAKE 1 TABLET BY MOUTH EVERY DAY, Disp: 30 tablet, Rfl: 0 .  ondansetron (ZOFRAN) 8 MG tablet, Take 1 tablet (8 mg total) by mouth every 8 (eight) hours as needed for nausea or vomiting., Disp: 30 tablet, Rfl: 0 .  potassium chloride SA (KLOR-CON M20) 20 MEQ tablet, Take 0.5 tablets (10 mEq total) by mouth daily. For potassium replacement. Only takes when taking Lasix (Patient taking differently: Take 20 mEq by mouth daily. ), Disp: 90 tablet, Rfl: 3 .  traMADol (ULTRAM) 50 MG tablet, Take 25 mg by mouth daily as needed for moderate pain. Only takes if necessary, Disp: , Rfl:  .  valsartan (DIOVAN) 80 MG tablet, TAKE 1 TABLET BY MOUTH EVERY DAY, Disp: 30 tablet, Rfl: 0 .  vitamin B-12 (CYANOCOBALAMIN) 1000 MCG tablet, Take 1,000 mcg by mouth daily., Disp: , Rfl:  .   warfarin (COUMADIN) 5 MG tablet, Take 1 tablet (5 mg total) by mouth every evening., Disp: 90 tablet, Rfl: 3 .  zolpidem (AMBIEN) 5 MG tablet, Take 0.5 tablets (2.5 mg total) by mouth at bedtime as needed for sleep., Disp: 30 tablet, Rfl: 2  Allergies Altace [ramipril]  Review of Systems Review of Systems - Oncology ROS negative other than ear stopped up   Physical Exam  Vitals Wt Readings from Last 3 Encounters:  08/06/18 122 lb 14.4 oz (55.7 kg)  08/10/17 121 lb (54.9 kg)  03/30/17 112 lb (50.8 kg)   Temp Readings from Last 3 Encounters:  08/06/18 97.9 F (36.6 C) (Oral)  03/30/17 97.5 F (36.4 C) (Oral)  02/25/17 98.1 F (36.7 C) (Oral)   BP Readings from Last 3 Encounters:  08/06/18 123/78  08/10/17 128/73  03/30/17 107/61   Pulse Readings from Last 3 Encounters:  08/06/18 75  08/10/17 94  03/30/17 (!) 59    Constitutional: Well-developed, well-nourished, and in no distress.   HENT: Head: Normocephalic and atraumatic. Right ear shows cerumen.   Mouth/Throat: No oropharyngeal exudate. Mucosa moist. Eyes: Pupils are equal, round, and reactive to light. Conjunctivae are normal. No scleral icterus.  Neck: Normal range of motion. Neck supple. No JVD present.  Cardiovascular: Normal rate, regular rhythm and normal heart sounds.  Exam reveals no gallop and no friction rub.   No murmur heard. Pulmonary/Chest: Effort normal and breath sounds normal. No respiratory distress. No wheezes.No rales.  Abdominal: Soft. Bowel sounds are normal. No distension. There is no tenderness. There is no guarding.  Musculoskeletal: No edema or tenderness.  Lymphadenopathy: No cervical, axillary or supraclavicular adenopathy.  Neurological: Alert and oriented to person, place, and time. No cranial nerve deficit.  Skin: Skin is warm and dry. No rash noted. No erythema. No pallor.  Psychiatric: Affect and judgment normal.  Bilateral breast exam:  Chaperone present.  Right breast shows  evidence of prior surgeries.  No palpable abnormalities noted bilaterally.    Labs No visits with results within 3 Day(s) from this visit.  Latest known visit with results is:  Anti-coag visit on 07/21/2018  Component Date Value Ref Range Status  . INR 07/21/2018 3.0  2.0 - 3.0 Final     Pathology Orders Placed This Encounter  Procedures  . MM DIAG BREAST TOMO BILATERAL    Standing Status:   Future    Standing Expiration Date:   08/07/2019    Order Specific Question:   Reason for Exam (SYMPTOM  OR DIAGNOSIS REQUIRED)    Answer:   right breast cancer    Order Specific Question:   Preferred imaging location?    Answer:   External       Zoila Shutter MD

## 2018-08-06 NOTE — Patient Instructions (Signed)
[facility]  Discharge Instructions:  You were seen by dr. higgs today.  _______________________________________________________________  Thank you for choosing [facility] to provide your oncology and hematology care.  To afford each patient quality time with our providers, please arrive at least 15 minutes before your scheduled appointment.  You need to re-schedule your appointment if you arrive 10 or more minutes late.  We strive to give you quality time with our providers, and arriving late affects you and other patients whose appointments are after yours.  Also, if you no show three or more times for appointments you may be dismissed from the clinic.  Again, thank you for choosing  [facility]. Our hope is that these requests will allow you access to exceptional care and in a timely manner. _______________________________________________________________  If you have questions after your visit, please contact our office at [phone] between the hours of 8:30 a.m. and 5:00 p.m. Voicemails left after 4:30 p.m. will not be returned until the following business day. _______________________________________________________________  For prescription refill requests, have your pharmacy contact our office. _______________________________________________________________  Recommendations made by the consultant and any test results will be sent to your referring physician. _______________________________________________________________ 

## 2018-08-09 ENCOUNTER — Ambulatory Visit (INDEPENDENT_AMBULATORY_CARE_PROVIDER_SITE_OTHER): Payer: Medicare Other | Admitting: *Deleted

## 2018-08-09 DIAGNOSIS — Z5181 Encounter for therapeutic drug level monitoring: Secondary | ICD-10-CM

## 2018-08-09 DIAGNOSIS — I4891 Unspecified atrial fibrillation: Secondary | ICD-10-CM

## 2018-08-09 LAB — POCT INR: INR: 3.2 — AB (ref 2.0–3.0)

## 2018-08-09 NOTE — Patient Instructions (Signed)
Decrease coumadin to 1 tablet daily except 1/2 tablet on Mondays and Thursdays Recheck Oct 7

## 2018-08-12 ENCOUNTER — Ambulatory Visit: Payer: Medicare Other | Admitting: Cardiology

## 2018-08-13 ENCOUNTER — Ambulatory Visit (HOSPITAL_COMMUNITY): Payer: Medicare Other

## 2018-08-31 ENCOUNTER — Ambulatory Visit (INDEPENDENT_AMBULATORY_CARE_PROVIDER_SITE_OTHER): Payer: Medicare Other | Admitting: *Deleted

## 2018-08-31 DIAGNOSIS — Z5181 Encounter for therapeutic drug level monitoring: Secondary | ICD-10-CM

## 2018-08-31 DIAGNOSIS — I4891 Unspecified atrial fibrillation: Secondary | ICD-10-CM

## 2018-08-31 LAB — PROTIME-INR: INR: 1.6 — AB (ref 0.9–1.1)

## 2018-08-31 NOTE — Patient Instructions (Signed)
Take coumadin 1 1/2 tablets tonight then increase dose to 1 tablet daily except 1/2 tablet on Mondays Recheck Oct 23

## 2018-09-15 ENCOUNTER — Ambulatory Visit (INDEPENDENT_AMBULATORY_CARE_PROVIDER_SITE_OTHER): Payer: Medicare Other | Admitting: *Deleted

## 2018-09-15 DIAGNOSIS — Z5181 Encounter for therapeutic drug level monitoring: Secondary | ICD-10-CM | POA: Diagnosis not present

## 2018-09-15 DIAGNOSIS — I4891 Unspecified atrial fibrillation: Secondary | ICD-10-CM

## 2018-09-15 LAB — POCT INR: INR: 2.2 (ref 2.0–3.0)

## 2018-09-15 NOTE — Patient Instructions (Signed)
Continue coumadin 1 tablet daily except 1/2 tablet on Mondays  Recheck in 4 weeks 

## 2018-09-17 DIAGNOSIS — Z23 Encounter for immunization: Secondary | ICD-10-CM | POA: Diagnosis not present

## 2018-09-28 DIAGNOSIS — Z961 Presence of intraocular lens: Secondary | ICD-10-CM | POA: Diagnosis not present

## 2018-09-28 DIAGNOSIS — H04123 Dry eye syndrome of bilateral lacrimal glands: Secondary | ICD-10-CM | POA: Diagnosis not present

## 2018-09-28 DIAGNOSIS — H2512 Age-related nuclear cataract, left eye: Secondary | ICD-10-CM | POA: Diagnosis not present

## 2018-09-28 DIAGNOSIS — H401132 Primary open-angle glaucoma, bilateral, moderate stage: Secondary | ICD-10-CM | POA: Diagnosis not present

## 2018-09-30 ENCOUNTER — Ambulatory Visit (INDEPENDENT_AMBULATORY_CARE_PROVIDER_SITE_OTHER): Payer: Medicare Other | Admitting: Cardiology

## 2018-09-30 ENCOUNTER — Encounter: Payer: Self-pay | Admitting: Cardiology

## 2018-09-30 VITALS — BP 112/58 | HR 87 | Ht 59.0 in | Wt 123.0 lb

## 2018-09-30 DIAGNOSIS — I10 Essential (primary) hypertension: Secondary | ICD-10-CM

## 2018-09-30 DIAGNOSIS — I480 Paroxysmal atrial fibrillation: Secondary | ICD-10-CM

## 2018-09-30 DIAGNOSIS — I340 Nonrheumatic mitral (valve) insufficiency: Secondary | ICD-10-CM | POA: Diagnosis not present

## 2018-09-30 DIAGNOSIS — I5022 Chronic systolic (congestive) heart failure: Secondary | ICD-10-CM | POA: Diagnosis not present

## 2018-09-30 NOTE — Progress Notes (Signed)
Clinical Summary Alicia Harrington is a 84 y.o.female seen today for follow up of the following medical problems.   1. Chronic systolic heart failure - possibly chemo induced, secondary to Adriamycin.  - echo 01/2016 LVEF 40-45%, mild to moderate AI, mild to moderate MR, PASP 42 - previous radiation treatments, previous CXRs have shown some scarring and COPD changes however had normal PFTs   - occasional DOE at times. Intermittent, no specific pattern.  - has had some recent edema. Can have some abdominal distension.  - takes lasix as needed based on activities. When takes regularly edema does resolved, SOB improves.  -not checking weights at home.    2. HTN   - she is compliant with meds  3. Paroxysmal afib  -mild infrequent palpitations.  No bleeding troubles on coumadin   - no recent palpitatoins. No bleeding on coumadin. Discussed again today NOACs, continues to prefer coumadin.     4. Breast CA - followed by oncology  5. Valvular heart disease - mild to moderate MR and AI by last echo    Past Medical History:  Diagnosis Date  . Adenocarcinoma, breast ([place])    treated with lumpectomy, node dissection, chemo and RT  . Ataxia   . Breast cancer ([place]) 2009   IDC+DCIS of right breast; triple positive  . Cardiomyopathy 2009   possibly secondary to Adriamycin; EF 40% in 12/09 and 50% 3/10; pulmonary edema in 2009  . Chronic anticoagulation 04/16/2011  . Chronic kidney disease (CKD), stage I    when on diuretic with creatinine of 1.6  . Complication of anesthesia    patient vomits with demerol  . DJD (degenerative joint disease)    right knee and lumbosacral spine  . Hearing impairment   . History of angina 2001   normal cornary arteries in 2001  . Hypertension   . Macular degeneration, dry    + cataracts  . Neuropathy    fingers and toes  . PONV (postoperative nausea and vomiting)   . Retinal macular atrophy   . Sick sinus syndrome ([facility])    Paroxysmal to permanent AF; initially first degree AV block also noted     Allergies  Allergen Reactions  . Altace [Ramipril] Rash     Current Outpatient Medications  Medication Sig Dispense Refill  . albuterol (PROVENTIL HFA;VENTOLIN HFA) 108 (90 BASE) MCG/ACT inhaler Inhale 1-2 puffs into the lungs every 6 (six) hours as needed for wheezing or shortness of breath. (Patient not taking: Reported on 08/10/2017) 1 Inhaler 2  . cholecalciferol (VITAMIN D) 1000 UNITS tablet Take 1,000 Units by mouth daily.      . Emollient (ROC RETINOL CORREXION) CREA Apply 1 application topically every other day. Apply to face at night    . furosemide (LASIX) 20 MG tablet Take 3 tablets daily in the morning or as needed 90 tablet 1  . hyoscyamine (LEVSIN SL) 0.125 MG SL tablet Place 1 tablet (0.125 mg total) under the tongue 2 (two) times daily as needed. (Patient taking differently: Place 0.125 mg under the tongue daily. ) 180 tablet 1  . hyoscyamine (LEVSIN SL) 0.125 MG SL tablet DISSOLVE 1 TABLET UNDER THE TONGUE TWICE DAILY AS NEEDED. 180 tablet 1  . latanoprost (XALATAN) 0.005 % ophthalmic solution Place 1 drop into both eyes at bedtime.   3  . metoprolol succinate (TOPROL-XL) 50 MG 24 hr tablet TAKE 1 TABLET BY MOUTH EVERY DAY 30 tablet 0  . ondansetron (ZOFRAN) 8 MG  tablet Take 1 tablet (8 mg total) by mouth every 8 (eight) hours as needed for nausea or vomiting. 30 tablet 0  . potassium chloride SA (KLOR-CON M20) 20 MEQ tablet Take 0.5 tablets (10 mEq total) by mouth daily. For potassium replacement. Only takes when taking Lasix (Patient taking differently: Take 20 mEq by mouth daily. ) 90 tablet 3  . traMADol (ULTRAM) 50 MG tablet Take 25 mg by mouth daily as needed for moderate pain. Only takes if necessary    . valsartan (DIOVAN) 80 MG tablet TAKE 1 TABLET BY MOUTH EVERY DAY 30 tablet 0  . vitamin B-12 (CYANOCOBALAMIN) 1000 MCG tablet Take 1,000 mcg by mouth daily.    Marland Kitchen warfarin (COUMADIN) 5 MG tablet  Take 1 tablet (5 mg total) by mouth every evening. 90 tablet 3  . zolpidem (AMBIEN) 5 MG tablet Take 0.5 tablets (2.5 mg total) by mouth at bedtime as needed for sleep. 30 tablet 2   No current facility-administered medications for this visit.      Past Surgical History:  Procedure Laterality Date  . APPENDECTOMY    . CATARACT EXTRACTION W/PHACO  10/11/2012   Procedure: CATARACT EXTRACTION PHACO AND INTRAOCULAR LENS PLACEMENT (IOC);  Surgeon: Williams Che, MD;  Location: [facility];  Service: Ophthalmology;  Laterality: Right;  CDE:10.82  . COLONOSCOPY  02/11/2012   Colonoscopy plus polypectomy x2, Rehman  . COLONOSCOPY N/A 03/30/2017   Procedure: COLONOSCOPY;  Surgeon: Rogene Houston, MD;  [facility];  Service: Endoscopy;  Laterality: N/A;  830  . DILATION AND CURETTAGE OF UTERUS    . ESOPHAGEAL DILATION N/A 01/19/2015   Procedure: ESOPHAGEAL DILATION;  Surgeon: Rogene Houston, MD;  [facility];  Service: Endoscopy;  Laterality: N/A;  . ESOPHAGOGASTRODUODENOSCOPY N/A 01/19/2015   Procedure: ESOPHAGOGASTRODUODENOSCOPY (EGD);  Surgeon: Rogene Houston, MD;  [facility];  Service: Endoscopy;  Laterality: N/A;  910  . MASTECTOMY PARTIAL / LUMPECTOMY W/ AXILLARY LYMPHADENECTOMY  2009   Carcinoma of the breast  . MICRODISCECTOMY LUMBAR  2009   lumbosacral spine  . MIDDLE EAR SURGERY     Left  . POLYPECTOMY  03/30/2017   Procedure: POLYPECTOMY;  Surgeon: Rogene Houston, MD;  [facility];  Service: Endoscopy;;  colon  . PORT-A-CATH REMOVAL    . TONSILLECTOMY       Allergies  Allergen Reactions  . Altace [Ramipril] Rash      Family History  Problem Relation Age of Onset  . Heart failure Mother   . Bladder Cancer Maternal Uncle        smoker  . Ovarian cancer Maternal Grandmother        dx. 58s; +surgery  . Stroke Brother   . Breast cancer Maternal Aunt        dx. late 81s  . Breast cancer Cousin 18  . Prostate cancer Cousin 40   . Cancer Cousin        unspecified type  . Stroke Paternal Uncle      Social History Ms. Riner reports that she quit smoking about 62 years ago. Her smoking use included cigarettes. She started smoking about 63 years ago. She has never used smokeless tobacco. Ms. Lehr reports that she drinks alcohol.   Review of Systems CONSTITUTIONAL: No weight loss, fever, chills, weakness or fatigue.  HEENT: Eyes: No visual loss, blurred vision, double vision or yellow sclerae.No hearing loss, sneezing, congestion, runny nose or sore throat.  SKIN:  No rash or itching.  CARDIOVASCULAR: per hpi RESPIRATORY: per hpi GASTROINTESTINAL: No anorexia, nausea, vomiting or diarrhea. No abdominal pain or blood.  GENITOURINARY: No burning on urination, no polyuria NEUROLOGICAL: No headache, dizziness, syncope, paralysis, ataxia, numbness or tingling in the extremities. No change in bowel or bladder control.  MUSCULOSKELETAL: No muscle, back pain, joint pain or stiffness.  LYMPHATICS: No enlarged nodes. No history of splenectomy.  PSYCHIATRIC: No history of depression or anxiety.  ENDOCRINOLOGIC: No reports of sweating, cold or heat intolerance. No polyuria or polydipsia.  Marland Kitchen   Physical Examination Vitals:   09/30/18 1057  BP: (!) 112/58  Pulse: 87  SpO2: 95%   Vitals:   09/30/18 1057  Weight: 123 lb (55.8 kg)  Height: 4\' 11"  (1.499 m)    Gen: resting comfortably, no acute distress HEENT: no scleral icterus, pupils equal round and reactive, no palptable cervical adenopathy,  CV: RRR, 2/6 diastolic murmur rusb, 2/6 systolic murmur apex Resp: Clear to auscultation bilaterally GI: abdomen is soft, non-tender, non-distended, normal bowel sounds, no hepatosplenomegaly MSK: extremities are warm, no edema.  Skin: warm, no rash Neuro:  no focal deficits Psych: appropriate affect   Diagnostic Studies  12/2013 Echo LVEF 45-50%, elevated LA pressure, mild AI, moderate MR, mod TR, PASP  46  10/2014 Echo Study Conclusions  - Left ventricle: The cavity size was normal. Wall thickness was normal. Systolic function was mildly to moderately reduced. The estimated ejection fraction was in the range of 40% to 45%. - Aortic valve: There was mild regurgitation. Regurgitation pressure half-time: 794 ms. - Mitral valve: Mildly calcified annulus. There was mild to moderate regurgitation. MR vena contracta is 0.4 cm. - Left atrium: The atrium was severely dilated. - Right ventricle: The interventricular septum is flattened in systole and diastole, consistent with RV pressure and volume overload. The cavity size was mildly dilated. Systolic function was low normal. RV TAPSE is 1.6 cm. - Right atrium: The atrium was severely dilated. - Atrial septum: No defect or patent foramen ovale was identified. - Pulmonary arteries: Systolic pressure was moderately increased. PA peak pressure: 60 mm Hg (S). - Technically adequate study.   01/2016 echo Study Conclusions  - Left ventricle: The cavity size was normal. Wall thickness was  normal. Systolic function was mildly to moderately reduced. The  estimated ejection fraction was in the range of 40% to 45%.  Diffuse hypokinesis. - Aortic valve: Mildly calcified annulus. Trileaflet; mildly  thickened leaflets. There was mild to moderate regurgitation.  Valve area (VTI): 2.35 cm^2. Valve area (Vmax): 2.4 cm^2. Valve  area (Vmean): 2.6 cm^2. Regurgitation pressure half-time: 690 ms. - Mitral valve: Mildly calcified annulus. Mildly thickened leaflets  . There was mild to moderate regurgitation. - Left atrium: The atrium was severely dilated. - Right atrium: The atrium was severely dilated. - Pulmonary arteries: Systolic pressure was moderately increased.  PA peak pressure: 42 mm Hg (S). - Technically adequate study.   Assessment and Plan  1. Chronic systolic HF - previous history of LV systolic dysfunction  thought to be related to Adriamycin - overall stable symptoms. Variable edema and SOB that improves with lasix dosing - repeat echo.   2. HTN  - at goal, she will continue current meds  3. Paroxysmal afib  - no recent symptoms - not interested in NOACs, continue coumadin.   4. Valvular heart disease - mild to mod MR and AI by echo in 2017 -repeat study to evaluate for progression    F/u  6 months      Arnoldo Lenis, M.D.,

## 2018-09-30 NOTE — Patient Instructions (Signed)

## 2018-10-05 ENCOUNTER — Ambulatory Visit (INDEPENDENT_AMBULATORY_CARE_PROVIDER_SITE_OTHER): Payer: Medicare Other | Admitting: Internal Medicine

## 2018-10-07 ENCOUNTER — Encounter (INDEPENDENT_AMBULATORY_CARE_PROVIDER_SITE_OTHER): Payer: Self-pay | Admitting: Internal Medicine

## 2018-10-07 ENCOUNTER — Ambulatory Visit (INDEPENDENT_AMBULATORY_CARE_PROVIDER_SITE_OTHER): Payer: Medicare Other | Admitting: Internal Medicine

## 2018-10-07 VITALS — BP 118/76 | HR 70 | Temp 98.4°F | Resp 18 | Ht 59.0 in | Wt 119.8 lb

## 2018-10-07 DIAGNOSIS — K580 Irritable bowel syndrome with diarrhea: Secondary | ICD-10-CM

## 2018-10-07 DIAGNOSIS — R140 Abdominal distension (gaseous): Secondary | ICD-10-CM

## 2018-10-07 MED ORDER — SIMETHICONE 180 MG PO CAPS: 180.0000 mg | ORAL_CAPSULE | Freq: Three times a day (TID) | ORAL | 0 refills | Status: DC

## 2018-10-07 MED ORDER — HYOSCYAMINE SULFATE 0.125 MG PO TABS: 0.1250 mg | ORAL_TABLET | Freq: Two times a day (BID) | ORAL | 0 refills | Status: DC | PRN

## 2018-10-07 MED ORDER — ONDANSETRON HCL 8 MG PO TABS: 8.0000 mg | ORAL_TABLET | Freq: Three times a day (TID) | ORAL | 1 refills | Status: AC | PRN

## 2018-10-07 NOTE — Patient Instructions (Signed)
Take Phazyme after every meal for 2 weeks and thereafter on as-needed basis. Please keep symptom diary for the 2 weeks and provide [place] with summary. Plain abdominal film to be done at any pain hospital when bloating significant.

## 2018-10-07 NOTE — Progress Notes (Signed)
Presenting complaint;  Bloating. History of IBS.  Database sent subjective:  Patient is 84 year old Caucasian female who is here for scheduled visit accompanied by her husband Dr. Viviano Simas.  She has a history of IBS with diarrhea and was last seen in December 2017.  She has a history of chronic systolic heart failure secondary to toxic effects of chemotherapy and is followed at cardiology clinic.  On her last visit she reported bloating and she is advised to follow-up with me.  Patient says she has been dealing with bloating for 1 year.  It has gradually been getting worse.  She is not sure if there is any pattern.  She feels she has bloating all day long and may be worse in the evening.  She gets relief when she passes flatus or has a bowel movement.  She may have gas pains as well but she has not experienced nausea or vomiting.  She states she had gained about 10 pounds and she has been on a diet for the last 3 weeks and she already has lost 3 pounds.  Since her last visit she has gained 7 pounds.  She takes Levsin tablet every morning.  She usually does not take a second dose because it makes her mouth dry.  Stool frequency ranges from 1-3 bowel movements per day.  Consistency varies.  She could have loose which she her formed stools.  First stool is usually formed.  She has not had nocturnal bowel movement.  She denies melena or rectal bleeding.  She states she may take Imodium once in a while when she goes out to eat.  She takes 2 mg before she leaves the house and she generally does not have constipation.  This is not listed in her medications.  She has not had any accidents.  She says she has very good appetite.  She and her husband go to the [place] times a week and on days when she does not she walks on a treadmill.  Current Medications: Outpatient Encounter Medications as of 10/07/2018  Medication Sig  . cholecalciferol (VITAMIN D) 1000 UNITS tablet Take 1,000 Units by mouth daily.    .  dorzolamide (TRUSOPT) 2 % ophthalmic solution Place 1 drop into both eyes 2 (two) times daily.   . furosemide (LASIX) 20 MG tablet Take 3 tablets daily in the morning or as needed  . latanoprost (XALATAN) 0.005 % ophthalmic solution Place 1 drop into both eyes at bedtime.   . metoprolol succinate (TOPROL-XL) 50 MG 24 hr tablet TAKE 1 TABLET BY MOUTH EVERY DAY  . ondansetron (ZOFRAN) 8 MG tablet Take 1 tablet (8 mg total) by mouth every 8 (eight) hours as needed for nausea or vomiting.  . potassium chloride SA (K-DUR,KLOR-CON) 20 MEQ tablet Take 20 mEq by mouth daily.  . traMADol (ULTRAM) 50 MG tablet Take 25 mg by mouth daily as needed for moderate pain. Only takes if necessary  . valsartan (DIOVAN) 80 MG tablet TAKE 1 TABLET BY MOUTH EVERY DAY  . vitamin B-12 (CYANOCOBALAMIN) 1000 MCG tablet Take 1,000 mcg by mouth daily.  Marland Kitchen warfarin (COUMADIN) 5 MG tablet Take 1 tablet (5 mg total) by mouth every evening.  . zolpidem (AMBIEN) 5 MG tablet Take 0.5 tablets (2.5 mg total) by mouth at bedtime as needed for sleep.  . [DISCONTINUED] albuterol (PROVENTIL HFA;VENTOLIN HFA) 108 (90 BASE) MCG/ACT inhaler Inhale 1-2 puffs into the lungs every 6 (six) hours as needed for wheezing or shortness of breath. (  Patient not taking: Reported on 10/07/2018)  . [DISCONTINUED] Emollient (ROC RETINOL CORREXION) CREA Apply 1 application topically every other day. Apply to face at night  . [DISCONTINUED] hyoscyamine (LEVSIN SL) 0.125 MG SL tablet DISSOLVE 1 TABLET UNDER THE TONGUE TWICE DAILY AS NEEDED. (Patient not taking: Reported on 10/07/2018)   No facility-administered encounter medications on file as of 10/07/2018.      Objective: Blood pressure 118/76, pulse 70, temperature 98.4 F (36.9 C), temperature source Oral, resp. rate 18, height 4\' 11"  (1.499 m), weight 119 lb 12.8 oz (54.3 kg). Patient is alert and in no acute distress. Conjunctiva is pink. Sclera is nonicteric Oropharyngeal mucosa is normal. No  neck masses or thyromegaly noted. Cardiac exam with regular rhythm normal S1 and S2. No murmur or gallop noted. Lungs are clear to auscultation. Abdomen abdomen is full but symmetrical.  Bowel sounds are hyperactive.  Percussion note is somewhat tympanitic and left mid and upper quadrant.  On palpation abdomen is soft and nontender.  No organomegaly or masses. No LE edema or clubbing noted.  Labs/studies Results: Lab data from 03/12/2018 WBC 4.7, H&H 12.4 and 36.3 and platelet count 161K. Serum albumin was 4.2.  Assessment:  #1.  Chronic bloating.  She does not have alarm symptoms.  She actually has gained weight since her last visit.  Therefore malabsorption is unlikely.  Bloating may be part and parcel of irritable bowel syndrome or she could have other conditions such as intestinal bacterial overgrowth.  It would be helpful to know if bloating is due to excessive gas in her small or large bowel. She has decreased calorie intake which I am sure is helping her bloating.  #2.  Irritable bowel syndrome.  She is doing well with low-dose hyoscyamine.   Plan:  Phazyme 180 mg p.o. 3 times daily for the next 2 weeks. Patient will go to the radiology department for plain abdominal film when bloating is significant.  Order placed in [place]. Patient advised to keep daily symptom diary for the next 2 weeks and call with progress report. New prescription given for hyoscyamine sublingual 1 twice daily as needed. She was also given refill for ondansetron. Office visit in 6 months or earlier if necessary.

## 2018-10-11 ENCOUNTER — Ambulatory Visit (HOSPITAL_COMMUNITY)
Admission: RE | Admit: 2018-10-11 | Discharge: 2018-10-11 | Disposition: A | Discharge status: Home / Self Care | Payer: Medicare Other | Source: Ambulatory Visit | Attending: Internal Medicine | Admitting: Internal Medicine

## 2018-10-11 DIAGNOSIS — R140 Abdominal distension (gaseous): Secondary | ICD-10-CM | POA: Insufficient documentation

## 2018-10-13 ENCOUNTER — Other Ambulatory Visit (INDEPENDENT_AMBULATORY_CARE_PROVIDER_SITE_OTHER): Payer: Self-pay | Admitting: *Deleted

## 2018-10-13 ENCOUNTER — Ambulatory Visit (INDEPENDENT_AMBULATORY_CARE_PROVIDER_SITE_OTHER): Payer: Medicare Other | Admitting: *Deleted

## 2018-10-13 ENCOUNTER — Ambulatory Visit (HOSPITAL_COMMUNITY)
Admission: RE | Admit: 2018-10-13 | Discharge: 2018-10-13 | Disposition: A | Discharge status: Home / Self Care | Payer: Medicare Other | Source: Ambulatory Visit | Attending: Cardiology | Admitting: Cardiology

## 2018-10-13 DIAGNOSIS — R140 Abdominal distension (gaseous): Secondary | ICD-10-CM

## 2018-10-13 DIAGNOSIS — I340 Nonrheumatic mitral (valve) insufficiency: Secondary | ICD-10-CM | POA: Diagnosis not present

## 2018-10-13 DIAGNOSIS — I4891 Unspecified atrial fibrillation: Secondary | ICD-10-CM

## 2018-10-13 DIAGNOSIS — R1084 Generalized abdominal pain: Secondary | ICD-10-CM

## 2018-10-13 DIAGNOSIS — Z5181 Encounter for therapeutic drug level monitoring: Secondary | ICD-10-CM

## 2018-10-13 LAB — POCT INR: INR: 3 (ref 2.0–3.0)

## 2018-10-13 NOTE — Addendum Note (Signed)
Addended by: Grayland Ormond on: 10/13/2018 02:33 PM   Modules accepted: Orders

## 2018-10-13 NOTE — Progress Notes (Signed)
*  PRELIMINARY RESULTS* Echocardiogram 2D Echocardiogram has been performed.  Samuel Germany 10/13/2018, 2:41 PM

## 2018-10-13 NOTE — Progress Notes (Signed)
crea

## 2018-10-13 NOTE — Patient Instructions (Signed)
Continue coumadin 1 tablet daily except 1/2 tablet on Mondays  Recheck in 4 weeks 

## 2018-10-14 DIAGNOSIS — H8023 Cochlear otosclerosis, bilateral: Secondary | ICD-10-CM | POA: Diagnosis not present

## 2018-10-14 DIAGNOSIS — M542 Cervicalgia: Secondary | ICD-10-CM | POA: Diagnosis not present

## 2018-10-14 DIAGNOSIS — H903 Sensorineural hearing loss, bilateral: Secondary | ICD-10-CM | POA: Diagnosis not present

## 2018-10-14 DIAGNOSIS — R51 Headache: Secondary | ICD-10-CM | POA: Diagnosis not present

## 2018-10-18 ENCOUNTER — Ambulatory Visit (HOSPITAL_COMMUNITY)
Admission: RE | Admit: 2018-10-18 | Discharge: 2018-10-18 | Disposition: A | Discharge status: Home / Self Care | Payer: Medicare Other | Source: Ambulatory Visit | Attending: Internal Medicine | Admitting: Internal Medicine

## 2018-10-18 ENCOUNTER — Ambulatory Visit (HOSPITAL_COMMUNITY): Payer: Medicare Other

## 2018-10-18 ENCOUNTER — Telehealth: Payer: Self-pay

## 2018-10-18 DIAGNOSIS — N200 Calculus of kidney: Secondary | ICD-10-CM | POA: Diagnosis not present

## 2018-10-18 DIAGNOSIS — R140 Abdominal distension (gaseous): Secondary | ICD-10-CM | POA: Insufficient documentation

## 2018-10-18 DIAGNOSIS — R1084 Generalized abdominal pain: Secondary | ICD-10-CM | POA: Insufficient documentation

## 2018-10-18 LAB — POCT I-STAT CREATININE: Creatinine, Ser: 1.1 mg/dL — ABNORMAL HIGH (ref 0.44–1.00)

## 2018-10-18 MED ORDER — IOPAMIDOL (ISOVUE-300) INJECTION 61%
100.0000 mL | Freq: Once | INTRAVENOUS | Status: AC | PRN
  Administered 2018-10-18: 100 mL via INTRAVENOUS

## 2018-10-18 NOTE — Telephone Encounter (Signed)
Called pt. Non answer, left message for pt to return call.

## 2018-10-18 NOTE — Telephone Encounter (Signed)
-----   Message from Arnoldo Lenis, MD sent at 10/18/2018  8:48 AM EST ----- Echo shows mild decrease in heart pumping function (changed from 40-45%, to 35-40%). Her heart valve remains moderately leaky. Since she had some change in her heart function would like to discuss some alterations in her medications, can she come back for earlier f/u, can add on a 2pm appt my hospital week the week before Christmas  Zandra Abts MD

## 2018-10-19 ENCOUNTER — Telehealth: Payer: Self-pay

## 2018-10-19 NOTE — Telephone Encounter (Signed)
-----   Message from Arnoldo Lenis, MD sent at 10/18/2018  8:48 AM EST ----- Echo shows mild decrease in heart pumping function (changed from 40-45%, to 35-40%). Her heart valve remains moderately leaky. Since she had some change in her heart function would like to discuss some alterations in her medications, can she come back for earlier f/u, can add on a 2pm appt my hospital week the week before Christmas  Zandra Abts MD

## 2018-10-19 NOTE — Telephone Encounter (Signed)
Called pt., no answer. Left message for pt to return call.  

## 2018-10-22 ENCOUNTER — Other Ambulatory Visit: Payer: Self-pay | Admitting: Cardiology

## 2018-10-27 DIAGNOSIS — I482 Chronic atrial fibrillation, unspecified: Secondary | ICD-10-CM | POA: Diagnosis not present

## 2018-10-27 DIAGNOSIS — Z79899 Other long term (current) drug therapy: Secondary | ICD-10-CM | POA: Diagnosis not present

## 2018-10-27 DIAGNOSIS — I5022 Chronic systolic (congestive) heart failure: Secondary | ICD-10-CM | POA: Diagnosis not present

## 2018-11-01 ENCOUNTER — Telehealth: Payer: Self-pay | Admitting: *Deleted

## 2018-11-01 ENCOUNTER — Telehealth (INDEPENDENT_AMBULATORY_CARE_PROVIDER_SITE_OTHER): Payer: Self-pay | Admitting: *Deleted

## 2018-11-01 NOTE — Telephone Encounter (Signed)
Spoke with Tammy at Dr Olevia Perches office.  Pt to decrease coumadin dose to 2.5mg  daily except 5mg  on Tuesdays, Thursdays and Sundays until INR check on 12/18.  Tammy verbalized understanding and will notify pt with instructions.

## 2018-11-01 NOTE — Telephone Encounter (Signed)
Per Alicia Harrington the Coumadin Nurse - Patient is to take Coumadin 2.5 mg daily on Monday, Wednesday, Friday , Saturday, except on Tuesday , Thursday , Sunday she will take 5 mg. She will stay on this schedule until her INR recheck on 11/10/2018.  Dr.Woodwadr was called and made aware.

## 2018-11-01 NOTE — Telephone Encounter (Signed)
Will be stating flagyl 250 mg by mouth TID for 10 days. Need advice while taking warfarin.

## 2018-11-01 NOTE — Telephone Encounter (Signed)
Per Dr.Rehman call in Flagyl 250 mg - take 1 by mouth three times daily for 10 days. Contact Dr.Branch about the Coumadin dose reduction while on this therapy.  Medication was called to [facility]/April. A call also was made to Dr.Branch's office. Awaiting to hear from the Coumadin nurse . Dr. Heide Spark was advises that I would let him know their recommendation as soon as we hear back.

## 2018-11-02 DIAGNOSIS — H04123 Dry eye syndrome of bilateral lacrimal glands: Secondary | ICD-10-CM | POA: Diagnosis not present

## 2018-11-02 DIAGNOSIS — H2512 Age-related nuclear cataract, left eye: Secondary | ICD-10-CM | POA: Diagnosis not present

## 2018-11-02 DIAGNOSIS — H401132 Primary open-angle glaucoma, bilateral, moderate stage: Secondary | ICD-10-CM | POA: Diagnosis not present

## 2018-11-02 DIAGNOSIS — Z961 Presence of intraocular lens: Secondary | ICD-10-CM | POA: Diagnosis not present

## 2018-11-03 DIAGNOSIS — N183 Chronic kidney disease, stage 3 (moderate): Secondary | ICD-10-CM | POA: Diagnosis not present

## 2018-11-03 DIAGNOSIS — I5022 Chronic systolic (congestive) heart failure: Secondary | ICD-10-CM | POA: Diagnosis not present

## 2018-11-03 DIAGNOSIS — I482 Chronic atrial fibrillation, unspecified: Secondary | ICD-10-CM | POA: Diagnosis not present

## 2018-11-03 DIAGNOSIS — I700 Atherosclerosis of aorta: Secondary | ICD-10-CM | POA: Diagnosis not present

## 2018-11-09 ENCOUNTER — Ambulatory Visit (INDEPENDENT_AMBULATORY_CARE_PROVIDER_SITE_OTHER): Payer: Medicare Other | Admitting: *Deleted

## 2018-11-09 ENCOUNTER — Encounter: Payer: Self-pay | Admitting: Cardiology

## 2018-11-09 ENCOUNTER — Ambulatory Visit (INDEPENDENT_AMBULATORY_CARE_PROVIDER_SITE_OTHER): Payer: Medicare Other | Admitting: Cardiology

## 2018-11-09 VITALS — BP 128/74 | HR 65 | Ht 61.0 in | Wt 121.4 lb

## 2018-11-09 DIAGNOSIS — I4891 Unspecified atrial fibrillation: Secondary | ICD-10-CM

## 2018-11-09 DIAGNOSIS — I5022 Chronic systolic (congestive) heart failure: Secondary | ICD-10-CM | POA: Diagnosis not present

## 2018-11-09 DIAGNOSIS — Z5181 Encounter for therapeutic drug level monitoring: Secondary | ICD-10-CM | POA: Diagnosis not present

## 2018-11-09 DIAGNOSIS — I480 Paroxysmal atrial fibrillation: Secondary | ICD-10-CM

## 2018-11-09 LAB — POCT INR: INR: 2.5 (ref 2.0–3.0)

## 2018-11-09 MED ORDER — FUROSEMIDE 20 MG PO TABS: ORAL_TABLET | ORAL | 3 refills | Status: DC

## 2018-11-09 MED ORDER — SACUBITRIL-VALSARTAN 24-26 MG PO TABS: 1.0000 | ORAL_TABLET | Freq: Two times a day (BID) | ORAL | 11 refills | Status: DC

## 2018-11-09 MED ORDER — METOPROLOL SUCCINATE ER 50 MG PO TB24: 50.0000 mg | ORAL_TABLET | Freq: Every day | ORAL | 3 refills | Status: DC

## 2018-11-09 NOTE — Progress Notes (Signed)
Clinical Summary Alicia Harrington is a 84 y.o.female seen today for follow up of the following medical problems. This is a focused visit on her history of chronic systolic heart failure.    1. Chronic systolic heart failure - possibly chemo induced, secondary to Adriamycin.  - echo 01/2016 LVEF 40-45%, mild to moderate AI, mild to moderate MR, PASP 42 - previous radiation treatments, previous CXRs have shown some scarring and COPD changes however had normal PFTs     09/2018 echo LVEF 35-40%, diffuse hypokinesis, moderate MR, mild AI - symptoms overall stable since last visit, occasiona SOB and LE edema that responsed to lasix.    2. Valvular heart disease - mild to moderate MR and AI by last echo    Past Medical History:  Diagnosis Date  . Adenocarcinoma, breast ([place])    treated with lumpectomy, node dissection, chemo and RT  . Ataxia   . Breast cancer ([place]) 2009   IDC+DCIS of right breast; triple positive  . Cardiomyopathy 2009   possibly secondary to Adriamycin; EF 40% in 12/09 and 50% 3/10; pulmonary edema in 2009  . Chronic anticoagulation 04/16/2011  . Chronic kidney disease (CKD), stage I    when on diuretic with creatinine of 1.6  . Complication of anesthesia    patient vomits with demerol  . DJD (degenerative joint disease)    right knee and lumbosacral spine  . Hearing impairment   . History of angina 2001   normal cornary arteries in 2001  . Hypertension   . Macular degeneration, dry    + cataracts  . Neuropathy    fingers and toes  . PONV (postoperative nausea and vomiting)   . Retinal macular atrophy   . Sick sinus syndrome ([facility])    Paroxysmal to permanent AF; initially first degree AV block also noted     Allergies  Allergen Reactions  . Altace [Ramipril] Rash     Current Outpatient Medications  Medication Sig Dispense Refill  . cholecalciferol (VITAMIN D) 1000 UNITS tablet Take 1,000 Units by mouth daily.      . dorzolamide (TRUSOPT) 2  % ophthalmic solution Place 1 drop into both eyes 2 (two) times daily.     . furosemide (LASIX) 20 MG tablet TAKE 3 TABLETS BY MOUTH DAILY EACH MORNING OR AS NEEDED. 90 tablet 1  . hyoscyamine (LEVSIN, ANASPAZ) 0.125 MG tablet Take 1 tablet (0.125 mg total) by mouth 2 (two) times daily as needed. 180 tablet 0  . latanoprost (XALATAN) 0.005 % ophthalmic solution Place 1 drop into both eyes at bedtime.   3  . metoprolol succinate (TOPROL-XL) 50 MG 24 hr tablet TAKE 1 TABLET BY MOUTH EVERY DAY 30 tablet 0  . ondansetron (ZOFRAN) 8 MG tablet Take 1 tablet (8 mg total) by mouth every 8 (eight) hours as needed for nausea or vomiting. 30 tablet 1  . potassium chloride SA (K-DUR,KLOR-CON) 20 MEQ tablet Take 20 mEq by mouth daily.    . Simethicone (PHAZYME) 180 MG CAPS Take 1 capsule (180 mg total) by mouth 3 (three) times daily.  0  . traMADol (ULTRAM) 50 MG tablet Take 25 mg by mouth daily as needed for moderate pain. Only takes if necessary    . valsartan (DIOVAN) 80 MG tablet TAKE 1 TABLET BY MOUTH EVERY DAY 30 tablet 0  . vitamin B-12 (CYANOCOBALAMIN) 1000 MCG tablet Take 1,000 mcg by mouth daily.    Marland Kitchen warfarin (COUMADIN) 5 MG tablet Take 1 tablet (  5 mg total) by mouth every evening. 90 tablet 3  . zolpidem (AMBIEN) 5 MG tablet Take 0.5 tablets (2.5 mg total) by mouth at bedtime as needed for sleep. 30 tablet 2   No current facility-administered medications for this visit.      Past Surgical History:  Procedure Laterality Date  . APPENDECTOMY    . CATARACT EXTRACTION W/PHACO  10/11/2012   Procedure: CATARACT EXTRACTION PHACO AND INTRAOCULAR LENS PLACEMENT (IOC);  Surgeon: Williams Che, MD;  Location: [facility];  Service: Ophthalmology;  Laterality: Right;  CDE:10.82  . COLONOSCOPY  02/11/2012   Colonoscopy plus polypectomy x2, Rehman  . COLONOSCOPY N/A 03/30/2017   Procedure: COLONOSCOPY;  Surgeon: Rogene Houston, MD;  Location: [facility] SU[facility];  Service: Endoscopy;  Laterality: N/A;  830  .  DILATION AND CURETTAGE OF UTERUS    . ESOPHAGEAL DILATION N/A 01/19/2015   Procedure: ESOPHAGEAL DILATION;  Surgeon: Rogene Houston, MD;  Location: [facility];  Service: Endoscopy;  Laterality: N/A;  . ESOPHAGOGASTRODUODENOSCOPY N/A 01/19/2015   Procedure: ESOPHAGOGASTRODUODENOSCOPY (EGD);  Surgeon: Rogene Houston, MD;  Location: [facility];  Service: Endoscopy;  Laterality: N/A;  910  . MASTECTOMY PARTIAL / LUMPECTOMY W/ AXILLARY LYMPHADENECTOMY  2009   Carcinoma of the breast  . MICRODISCECTOMY LUMBAR  2009   lumbosacral spine  . MIDDLE EAR SURGERY     Left  . POLYPECTOMY  03/30/2017   Procedure: POLYPECTOMY;  Surgeon: Rogene Houston, MD;  Location: [facility];  Service: Endoscopy;;  colon  . PORT-A-CATH REMOVAL    . TONSILLECTOMY       Allergies  Allergen Reactions  . Altace [Ramipril] Rash      Family History  Problem Relation Age of Onset  . Heart failure Mother   . Bladder Cancer Maternal Uncle        smoker  . Ovarian cancer Maternal Grandmother        dx. 46s; +surgery  . Stroke Brother   . Breast cancer Maternal Aunt        dx. late 50s  . Breast cancer Cousin 51  . Prostate cancer Cousin 28  . Cancer Cousin        unspecified type  . Stroke Paternal Uncle      Social History Ms. Brindley reports that she quit smoking about 62 years ago. Her smoking use included cigarettes. She started smoking about 64 years ago. She has never used smokeless tobacco. Ms. Dekker reports current alcohol use.   Review of Systems CONSTITUTIONAL: No weight loss, fever, chills, weakness or fatigue.  HEENT: Eyes: No visual loss, blurred vision, double vision or yellow sclerae.No hearing loss, sneezing, congestion, runny nose or sore throat.  SKIN: No rash or itching.  CARDIOVASCULAR: no chest pain, no palpitations.  RESPIRATORY: per hpi GASTROINTESTINAL: No anorexia, nausea, vomiting or diarrhea. No abdominal pain or blood.  GENITOURINARY: No burning on urination,  no polyuria NEUROLOGICAL: No headache, dizziness, syncope, paralysis, ataxia, numbness or tingling in the extremities. No change in bowel or bladder control.  MUSCULOSKELETAL: No muscle, back pain, joint pain or stiffness.  LYMPHATICS: No enlarged nodes. No history of splenectomy.  PSYCHIATRIC: No history of depression or anxiety.  ENDOCRINOLOGIC: No reports of sweating, cold or heat intolerance. No polyuria or polydipsia.  Marland Kitchen   Physical Examination Vitals:   11/09/18 1405  BP: 128/74  Pulse: 65  SpO2: 94%   Vitals:   11/09/18 1405  Weight: 121 lb 6.4 oz (55.1  kg)  Height: 5\' 1"  (1.549 m)    Gen: resting comfortably, no acute distress HEENT: no scleral icterus, pupils equal round and reactive, no palptable cervical adenopathy,  CV: irreg, no m/r/g no jvd Resp: Clear to auscultation bilaterally GI: abdomen is soft, non-tender, non-distended, normal bowel sounds, no hepatosplenomegaly MSK: extremities are warm, no edema.  Skin: warm, no rash Neuro:  no focal deficits Psych: appropriate affect   Diagnostic Studies  12/2013 Echo LVEF 45-50%, elevated LA pressure, mild AI, moderate MR, mod TR, PASP 46  10/2014 Echo Study Conclusions  - Left ventricle: The cavity size was normal. Wall thickness was normal. Systolic function was mildly to moderately reduced. The estimated ejection fraction was in the range of 40% to 45%. - Aortic valve: There was mild regurgitation. Regurgitation pressure half-time: 794 ms. - Mitral valve: Mildly calcified annulus. There was mild to moderate regurgitation. MR vena contracta is 0.4 cm. - Left atrium: The atrium was severely dilated. - Right ventricle: The interventricular septum is flattened in systole and diastole, consistent with RV pressure and volume overload. The cavity size was mildly dilated. Systolic function was low normal. RV TAPSE is 1.6 cm. - Right atrium: The atrium was severely dilated. - Atrial septum: No  defect or patent foramen ovale was identified. - Pulmonary arteries: Systolic pressure was moderately increased. PA peak pressure: 60 mm Hg (S). - Technically adequate study.   01/2016 echo Study Conclusions  - Left ventricle: The cavity size was normal. Wall thickness was  normal. Systolic function was mildly to moderately reduced. The  estimated ejection fraction was in the range of 40% to 45%.  Diffuse hypokinesis. - Aortic valve: Mildly calcified annulus. Trileaflet; mildly  thickened leaflets. There was mild to moderate regurgitation.  Valve area (VTI): 2.35 cm^2. Valve area (Vmax): 2.4 cm^2. Valve  area (Vmean): 2.6 cm^2. Regurgitation pressure half-time: 690 ms. - Mitral valve: Mildly calcified annulus. Mildly thickened leaflets  . There was mild to moderate regurgitation. - Left atrium: The atrium was severely dilated. - Right atrium: The atrium was severely dilated. - Pulmonary arteries: Systolic pressure was moderately increased.  PA peak pressure: 42 mm Hg (S). - Technically adequate study.     Assessment and Plan  1. Chronic systolic HF - milld decrease in LVEF from baseline - we will change her ARB to entresto 24/26mg  bid, titrate as tolerated. Potentially titrate up beta blocker over follow up. - repeat echo in 3-6 months    2. Mitral regurgitation - moderate by recent echo, continue to monitor        Arnoldo Lenis, M.D.

## 2018-11-09 NOTE — Patient Instructions (Signed)
Medication Instructions:  STOP Valsartan   START Entresto 24/26 mg twice a day   If you need a refill on your cardiac medications before your next appointment, please call your pharmacy.   Lab work: None at this time  If you have labs (blood work) drawn today and your tests are completely normal, you will receive your results only by: Marland Kitchen MyChart Message (if you have MyChart) OR . A paper copy in the mail If you have any lab test that is abnormal or we need to change your treatment, we will call you to review the results.  Testing/Procedures: None at this time  Follow-Up: At [facility], you and your health needs are our priority.  As part of our continuing mission to provide you with exceptional heart care, we have created designated Provider Care Teams.  These Care Teams include your primary Cardiologist (physician) and Advanced Practice Providers (APPs -  Physician Assistants and Nurse Practitioners) who all work together to provide you with the care you need, when you need it. You will need a follow up appointment in 3 months.  Please call our office 2 months in advance to schedule this appointment.  You may see Carlyle Dolly, MD or one of the following Advanced Practice Providers on your designated Care Team:   Bernerd Pho, PA-C [facility]) . Ermalinda Barrios, PA-C ([facility])  Any Other Special Instructions Will Be Listed Below (If Applicable). Call in several weeks or use MyChart - type in [place] My Chart- follow instructions for new user or for re-set User name or password  with blood pressure readings

## 2018-11-09 NOTE — Patient Instructions (Signed)
Pt finishes Flagyl tomorrow.  Coumadin dose had been adjusted. Restart coumadin 1 tablet daily except 1/2 tablet on Mondays Recheck in 3 weeks

## 2018-11-10 ENCOUNTER — Other Ambulatory Visit: Payer: Self-pay | Admitting: *Deleted

## 2018-11-10 DIAGNOSIS — Z5181 Encounter for therapeutic drug level monitoring: Secondary | ICD-10-CM

## 2018-11-10 DIAGNOSIS — I4891 Unspecified atrial fibrillation: Secondary | ICD-10-CM

## 2018-11-29 DIAGNOSIS — M533 Sacrococcygeal disorders, not elsewhere classified: Secondary | ICD-10-CM | POA: Diagnosis not present

## 2018-11-29 DIAGNOSIS — M4316 Spondylolisthesis, lumbar region: Secondary | ICD-10-CM | POA: Diagnosis not present

## 2018-11-30 DIAGNOSIS — I4891 Unspecified atrial fibrillation: Secondary | ICD-10-CM | POA: Diagnosis not present

## 2018-11-30 DIAGNOSIS — Z5181 Encounter for therapeutic drug level monitoring: Secondary | ICD-10-CM | POA: Diagnosis not present

## 2018-11-30 LAB — PROTIME-INR: INR: 3.3 — AB (ref <=1.1)

## 2018-12-01 ENCOUNTER — Ambulatory Visit (INDEPENDENT_AMBULATORY_CARE_PROVIDER_SITE_OTHER): Payer: Medicare Other | Admitting: Pharmacist

## 2018-12-01 DIAGNOSIS — Z5181 Encounter for therapeutic drug level monitoring: Secondary | ICD-10-CM

## 2018-12-01 DIAGNOSIS — I480 Paroxysmal atrial fibrillation: Secondary | ICD-10-CM

## 2018-12-03 NOTE — Telephone Encounter (Signed)
Please have her increase her Toprol to 75mg  daily. Check her bp 3-4 times a week and update [place] on her blood pressure in 2 weeks. If they are able to also check heart rates with blood pressures that would be helpful as well.   Zandra Abts MD

## 2018-12-07 DIAGNOSIS — M461 Sacroiliitis, not elsewhere classified: Secondary | ICD-10-CM | POA: Diagnosis not present

## 2018-12-07 DIAGNOSIS — M7918 Myalgia, other site: Secondary | ICD-10-CM | POA: Diagnosis not present

## 2018-12-07 DIAGNOSIS — G8929 Other chronic pain: Secondary | ICD-10-CM | POA: Diagnosis not present

## 2018-12-10 ENCOUNTER — Ambulatory Visit: Payer: Medicare Other

## 2018-12-10 ENCOUNTER — Telehealth: Payer: Self-pay

## 2018-12-10 DIAGNOSIS — R55 Syncope and collapse: Secondary | ICD-10-CM

## 2018-12-10 MED ORDER — VALSARTAN 80 MG PO TABS: 80.0000 mg | ORAL_TABLET | Freq: Every day | ORAL | 3 refills | Status: DC

## 2018-12-10 MED ORDER — METOPROLOL SUCCINATE ER 25 MG PO TB24: ORAL_TABLET | ORAL | 3 refills | Status: DC

## 2018-12-10 NOTE — Telephone Encounter (Signed)
Ok to stop entresto and go back to valsartan 80mg  daily. I don't see where her Toprol was changed on our med list on epic, she was to go to 75mg  daily based on last phone message, please confirm this was done. I know they are out of town for a few months, will need to make sure we have the right pharmacy to send to   Zandra Abts MD       I spoke with [facility]ist in [place], we stopped Entresto, started Valsartan 80 mg daily # 90 RF:3, and Increased Toprol XL to 75 mg (25 mg daily in addition to her 50 mg dose for total 75 mg daily, #90 RF:3

## 2018-12-10 NOTE — Telephone Encounter (Signed)
See message in e-mail

## 2018-12-10 NOTE — Telephone Encounter (Signed)
Ok to stop entresto and go back to valsartan 80mg  daily. I don't see where her Toprol was changed on our med list on epic, she was to go to 75mg  daily based on last phone message, please confirm this was done. I know they are out of town for a few months, will need to make sure we have the right pharmacy to send to   Zandra Abts MD

## 2018-12-13 ENCOUNTER — Other Ambulatory Visit: Payer: Self-pay

## 2018-12-13 DIAGNOSIS — Z79899 Other long term (current) drug therapy: Secondary | ICD-10-CM | POA: Diagnosis not present

## 2018-12-13 DIAGNOSIS — M533 Sacrococcygeal disorders, not elsewhere classified: Secondary | ICD-10-CM | POA: Diagnosis not present

## 2018-12-13 DIAGNOSIS — M461 Sacroiliitis, not elsewhere classified: Secondary | ICD-10-CM | POA: Diagnosis not present

## 2018-12-13 DIAGNOSIS — M545 Low back pain: Secondary | ICD-10-CM | POA: Diagnosis not present

## 2018-12-13 DIAGNOSIS — I208 Other forms of angina pectoris: Secondary | ICD-10-CM | POA: Diagnosis not present

## 2018-12-13 DIAGNOSIS — Z7901 Long term (current) use of anticoagulants: Secondary | ICD-10-CM | POA: Diagnosis not present

## 2018-12-13 DIAGNOSIS — H8090 Unspecified otosclerosis, unspecified ear: Secondary | ICD-10-CM | POA: Diagnosis not present

## 2018-12-13 DIAGNOSIS — I4891 Unspecified atrial fibrillation: Secondary | ICD-10-CM | POA: Diagnosis not present

## 2018-12-13 DIAGNOSIS — R55 Syncope and collapse: Secondary | ICD-10-CM

## 2018-12-14 ENCOUNTER — Ambulatory Visit (INDEPENDENT_AMBULATORY_CARE_PROVIDER_SITE_OTHER): Payer: Medicare Other | Admitting: Pharmacist

## 2018-12-14 ENCOUNTER — Telehealth: Payer: Self-pay

## 2018-12-14 DIAGNOSIS — Z5181 Encounter for therapeutic drug level monitoring: Secondary | ICD-10-CM

## 2018-12-14 DIAGNOSIS — I480 Paroxysmal atrial fibrillation: Secondary | ICD-10-CM

## 2018-12-14 LAB — POCT INR: INR: 1.8 — AB (ref 2.0–3.0)

## 2018-12-14 NOTE — Telephone Encounter (Signed)
Spouse called to say patient had INR yesterday, it was 1.8.Please call them with instructions.They are in [facility] for the next several months.You may call [phone]

## 2018-12-14 NOTE — Telephone Encounter (Signed)
See anticoag encounter from 12/14/18 for details

## 2018-12-16 DIAGNOSIS — M461 Sacroiliitis, not elsewhere classified: Secondary | ICD-10-CM | POA: Diagnosis not present

## 2018-12-20 ENCOUNTER — Other Ambulatory Visit: Payer: Self-pay

## 2018-12-20 MED ORDER — FUROSEMIDE 20 MG PO TABS: ORAL_TABLET | ORAL | 3 refills | Status: DC

## 2018-12-20 NOTE — Telephone Encounter (Signed)
Refilled to pharmacy in [place]

## 2018-12-27 DIAGNOSIS — M461 Sacroiliitis, not elsewhere classified: Secondary | ICD-10-CM | POA: Diagnosis not present

## 2018-12-30 DIAGNOSIS — M461 Sacroiliitis, not elsewhere classified: Secondary | ICD-10-CM | POA: Diagnosis not present

## 2019-01-03 DIAGNOSIS — Z5181 Encounter for therapeutic drug level monitoring: Secondary | ICD-10-CM | POA: Diagnosis not present

## 2019-01-03 LAB — PROTIME-INR: INR: 3.2 — AB (ref <=1.1)

## 2019-01-04 DIAGNOSIS — M461 Sacroiliitis, not elsewhere classified: Secondary | ICD-10-CM | POA: Diagnosis not present

## 2019-01-05 ENCOUNTER — Ambulatory Visit (INDEPENDENT_AMBULATORY_CARE_PROVIDER_SITE_OTHER): Payer: Medicare Other | Admitting: Pharmacist

## 2019-01-05 DIAGNOSIS — I480 Paroxysmal atrial fibrillation: Secondary | ICD-10-CM

## 2019-01-05 DIAGNOSIS — Z5181 Encounter for therapeutic drug level monitoring: Secondary | ICD-10-CM

## 2019-01-06 DIAGNOSIS — M461 Sacroiliitis, not elsewhere classified: Secondary | ICD-10-CM | POA: Diagnosis not present

## 2019-01-11 DIAGNOSIS — M461 Sacroiliitis, not elsewhere classified: Secondary | ICD-10-CM | POA: Diagnosis not present

## 2019-01-13 DIAGNOSIS — M47812 Spondylosis without myelopathy or radiculopathy, cervical region: Secondary | ICD-10-CM | POA: Diagnosis not present

## 2019-01-13 DIAGNOSIS — M461 Sacroiliitis, not elsewhere classified: Secondary | ICD-10-CM | POA: Diagnosis not present

## 2019-01-13 DIAGNOSIS — M4802 Spinal stenosis, cervical region: Secondary | ICD-10-CM | POA: Diagnosis not present

## 2019-01-13 DIAGNOSIS — M7918 Myalgia, other site: Secondary | ICD-10-CM | POA: Diagnosis not present

## 2019-01-17 DIAGNOSIS — M461 Sacroiliitis, not elsewhere classified: Secondary | ICD-10-CM | POA: Diagnosis not present

## 2019-01-17 DIAGNOSIS — Z5181 Encounter for therapeutic drug level monitoring: Secondary | ICD-10-CM | POA: Diagnosis not present

## 2019-01-17 LAB — PROTIME-INR: INR: 3.3 — AB (ref 0.9–1.1)

## 2019-01-18 ENCOUNTER — Ambulatory Visit (INDEPENDENT_AMBULATORY_CARE_PROVIDER_SITE_OTHER): Payer: Medicare Other | Admitting: *Deleted

## 2019-01-18 DIAGNOSIS — Z5181 Encounter for therapeutic drug level monitoring: Secondary | ICD-10-CM

## 2019-01-18 DIAGNOSIS — I480 Paroxysmal atrial fibrillation: Secondary | ICD-10-CM

## 2019-01-18 NOTE — Patient Instructions (Signed)
Spoke with patient's husband.  Pt took her usual 1/2 tablet of coumadin last night.  Told him to have pt take another 1/2 tablet tonight.  They have been increasing greens (spinich salads) over the last 4-5 days.  Continue coumadin 1 tablet daily except 1/2 tablet on Mondays Recheck in 2 weeks Pt has standing order in [place].

## 2019-01-20 DIAGNOSIS — M461 Sacroiliitis, not elsewhere classified: Secondary | ICD-10-CM | POA: Diagnosis not present

## 2019-01-25 DIAGNOSIS — M461 Sacroiliitis, not elsewhere classified: Secondary | ICD-10-CM | POA: Diagnosis not present

## 2019-01-27 DIAGNOSIS — M461 Sacroiliitis, not elsewhere classified: Secondary | ICD-10-CM | POA: Diagnosis not present

## 2019-01-31 DIAGNOSIS — M461 Sacroiliitis, not elsewhere classified: Secondary | ICD-10-CM | POA: Diagnosis not present

## 2019-02-10 DIAGNOSIS — M5381 Other specified dorsopathies, occipito-atlanto-axial region: Secondary | ICD-10-CM | POA: Diagnosis not present

## 2019-02-10 DIAGNOSIS — Z79899 Other long term (current) drug therapy: Secondary | ICD-10-CM | POA: Diagnosis not present

## 2019-02-10 DIAGNOSIS — I208 Other forms of angina pectoris: Secondary | ICD-10-CM | POA: Diagnosis not present

## 2019-02-10 DIAGNOSIS — I4891 Unspecified atrial fibrillation: Secondary | ICD-10-CM | POA: Diagnosis not present

## 2019-02-10 DIAGNOSIS — M47813 Spondylosis without myelopathy or radiculopathy, cervicothoracic region: Secondary | ICD-10-CM | POA: Diagnosis not present

## 2019-02-10 DIAGNOSIS — Z7901 Long term (current) use of anticoagulants: Secondary | ICD-10-CM | POA: Diagnosis not present

## 2019-02-10 DIAGNOSIS — M47812 Spondylosis without myelopathy or radiculopathy, cervical region: Secondary | ICD-10-CM | POA: Diagnosis not present

## 2019-02-17 DIAGNOSIS — Z5181 Encounter for therapeutic drug level monitoring: Secondary | ICD-10-CM | POA: Diagnosis not present

## 2019-02-17 LAB — PROTIME-INR: INR: 1.6 — AB (ref 0.9–1.1)

## 2019-02-21 ENCOUNTER — Ambulatory Visit (INDEPENDENT_AMBULATORY_CARE_PROVIDER_SITE_OTHER): Payer: Medicare Other | Admitting: *Deleted

## 2019-02-21 DIAGNOSIS — I4891 Unspecified atrial fibrillation: Secondary | ICD-10-CM

## 2019-02-21 DIAGNOSIS — Z5181 Encounter for therapeutic drug level monitoring: Secondary | ICD-10-CM | POA: Diagnosis not present

## 2019-02-21 NOTE — Patient Instructions (Signed)
Spoke with patient's husband today. Reported INR of 1.6 from Lab in [place]. On 02/17/19. Pt to take coumadin 1 1/2 tablets tonight then 1 tablet daily and recheck INR on 03/01/19.  Pt has a standing order in [place].

## 2019-03-01 DIAGNOSIS — Z5181 Encounter for therapeutic drug level monitoring: Secondary | ICD-10-CM | POA: Diagnosis not present

## 2019-03-01 LAB — POCT INR: INR: 2.9 (ref 2.0–3.0)

## 2019-03-03 ENCOUNTER — Ambulatory Visit (INDEPENDENT_AMBULATORY_CARE_PROVIDER_SITE_OTHER): Payer: Medicare Other | Admitting: *Deleted

## 2019-03-03 DIAGNOSIS — I480 Paroxysmal atrial fibrillation: Secondary | ICD-10-CM

## 2019-03-03 DIAGNOSIS — Z5181 Encounter for therapeutic drug level monitoring: Secondary | ICD-10-CM | POA: Diagnosis not present

## 2019-03-03 NOTE — Patient Instructions (Signed)
Spoke with patient's husband today. Received fax with INR of 2.9 from Lab in [place].  Continue coumadin 1 tablet daily except 1/2 tablet on Tuesdays Recheck in 3-4 wks Pt has a standing order in [place].

## 2019-03-04 DIAGNOSIS — M7918 Myalgia, other site: Secondary | ICD-10-CM | POA: Diagnosis not present

## 2019-03-04 DIAGNOSIS — G894 Chronic pain syndrome: Secondary | ICD-10-CM | POA: Diagnosis not present

## 2019-03-04 DIAGNOSIS — M5030 Other cervical disc degeneration, unspecified cervical region: Secondary | ICD-10-CM | POA: Diagnosis not present

## 2019-03-04 DIAGNOSIS — M47812 Spondylosis without myelopathy or radiculopathy, cervical region: Secondary | ICD-10-CM | POA: Diagnosis not present

## 2019-03-13 ENCOUNTER — Other Ambulatory Visit: Payer: Self-pay | Admitting: Cardiology

## 2019-03-18 DIAGNOSIS — N6310 Unspecified lump in the right breast, unspecified quadrant: Secondary | ICD-10-CM | POA: Diagnosis not present

## 2019-03-18 DIAGNOSIS — Z853 Personal history of malignant neoplasm of breast: Secondary | ICD-10-CM | POA: Diagnosis not present

## 2019-03-21 ENCOUNTER — Other Ambulatory Visit: Payer: Self-pay | Admitting: Cardiology

## 2019-03-21 ENCOUNTER — Telehealth: Payer: Self-pay | Admitting: Cardiology

## 2019-03-21 MED ORDER — VALSARTAN 80 MG PO TABS: 80.0000 mg | ORAL_TABLET | Freq: Every day | ORAL | 3 refills | Status: DC

## 2019-03-21 NOTE — Telephone Encounter (Signed)
Done

## 2019-03-21 NOTE — Telephone Encounter (Signed)
°*  STAT* If patient is at the pharmacy, call can be transferred to refill team.   1. Which medications need to be refilled? valsartan (DIOVAN) 80 MG tablet    2. Which pharmacy/location (including street and city if local pharmacy) is medication to be sent to? [facility] [place]   3. Do they need a 30 day or 90 day supply? 90 x 3 Refill # [id number]

## 2019-03-23 DIAGNOSIS — N6489 Other specified disorders of breast: Secondary | ICD-10-CM | POA: Diagnosis not present

## 2019-03-23 DIAGNOSIS — Z853 Personal history of malignant neoplasm of breast: Secondary | ICD-10-CM | POA: Diagnosis not present

## 2019-03-23 DIAGNOSIS — N644 Mastodynia: Secondary | ICD-10-CM | POA: Diagnosis not present

## 2019-03-23 DIAGNOSIS — N6310 Unspecified lump in the right breast, unspecified quadrant: Secondary | ICD-10-CM | POA: Diagnosis not present

## 2019-03-23 DIAGNOSIS — C50411 Malignant neoplasm of upper-outer quadrant of right female breast: Secondary | ICD-10-CM | POA: Diagnosis not present

## 2019-03-23 DIAGNOSIS — Z170 Estrogen receptor positive status [ER+]: Secondary | ICD-10-CM | POA: Diagnosis not present

## 2019-04-06 ENCOUNTER — Ambulatory Visit (INDEPENDENT_AMBULATORY_CARE_PROVIDER_SITE_OTHER): Payer: Medicare Other | Admitting: *Deleted

## 2019-04-06 DIAGNOSIS — Z5181 Encounter for therapeutic drug level monitoring: Secondary | ICD-10-CM | POA: Diagnosis not present

## 2019-04-06 DIAGNOSIS — I4821 Permanent atrial fibrillation: Secondary | ICD-10-CM

## 2019-04-06 LAB — PROTIME-INR: INR: 6.9 — AB (ref 0.9–1.1)

## 2019-04-06 NOTE — Patient Instructions (Signed)
Lab in [place] call with INR of 6.9.  Spoke with pt and spouse.  Pt denies any medication changes or changes in routine.  I verified coumadin dosage and instructions with pt's spouse and strength and dose are correct.  Questioned if lab work was venous or fingerstick.  States it was venous and lab tech didn't like the looks of the first tube and drew a second tube.  Instructed pt to hold coumadin tonight and go for repeat Stat INR tomorrow. They are in agreement.  Bleeding and fall precautions discussed and they verbalized understanding.  To go to ED if bleeding should develop.

## 2019-04-07 ENCOUNTER — Ambulatory Visit (INDEPENDENT_AMBULATORY_CARE_PROVIDER_SITE_OTHER): Payer: Medicare Other | Admitting: *Deleted

## 2019-04-07 DIAGNOSIS — Z5181 Encounter for therapeutic drug level monitoring: Secondary | ICD-10-CM | POA: Diagnosis not present

## 2019-04-07 DIAGNOSIS — I4821 Permanent atrial fibrillation: Secondary | ICD-10-CM | POA: Diagnosis not present

## 2019-04-07 LAB — POCT INR: INR: 5.1 — AB (ref 2.0–3.0)

## 2019-04-07 NOTE — Patient Instructions (Signed)
Lab in [place] called with INR of 5.1.  Spoke with pt and spouse.  Instructed pt to hold coumadin tonight and tomorrow night then restart coumadin on Saturday night (04/09/19)   Repeat Stat INR on 04/13/19. They are in agreement.  Bleeding and fall precautions discussed and they verbalized understanding.  To go to ED if bleeding should develop. Pt has a standing order in [place].

## 2019-04-12 ENCOUNTER — Other Ambulatory Visit: Payer: Self-pay | Admitting: Cardiology

## 2019-04-13 DIAGNOSIS — Z5181 Encounter for therapeutic drug level monitoring: Secondary | ICD-10-CM | POA: Diagnosis not present

## 2019-04-13 LAB — PROTIME-INR: INR: 1.8 — AB (ref 0.9–1.1)

## 2019-04-14 ENCOUNTER — Ambulatory Visit (INDEPENDENT_AMBULATORY_CARE_PROVIDER_SITE_OTHER): Payer: Medicare Other | Admitting: *Deleted

## 2019-04-14 ENCOUNTER — Telehealth: Payer: Self-pay | Admitting: *Deleted

## 2019-04-14 DIAGNOSIS — I4891 Unspecified atrial fibrillation: Secondary | ICD-10-CM | POA: Diagnosis not present

## 2019-04-14 DIAGNOSIS — Z5181 Encounter for therapeutic drug level monitoring: Secondary | ICD-10-CM

## 2019-04-14 NOTE — Telephone Encounter (Signed)
Done.  See coumadin note. 

## 2019-04-14 NOTE — Patient Instructions (Signed)
Take coumadin 1 1/2 tablets tonight then resume 1 tablet daily except 1/2 tablet on Mondays. Recheck 04/27/19 Pt has a standing order in [place].  Instructions given to pt and spouse and they verbalized understanding

## 2019-04-14 NOTE — Telephone Encounter (Signed)
INR 1.8

## 2019-04-19 ENCOUNTER — Telehealth: Payer: Self-pay

## 2019-04-19 MED ORDER — POTASSIUM CHLORIDE CRYS ER 20 MEQ PO TBCR: 20.0000 meq | EXTENDED_RELEASE_TABLET | Freq: Every day | ORAL | 3 refills | Status: DC

## 2019-04-19 NOTE — Telephone Encounter (Signed)
Sent potassium 20 meq to pt pharmacy.

## 2019-04-21 DIAGNOSIS — Z1159 Encounter for screening for other viral diseases: Secondary | ICD-10-CM | POA: Diagnosis not present

## 2019-04-27 DIAGNOSIS — Z5181 Encounter for therapeutic drug level monitoring: Secondary | ICD-10-CM | POA: Diagnosis not present

## 2019-04-27 LAB — PROTIME-INR: INR: 2.5 — AB (ref 0.9–1.1)

## 2019-04-28 ENCOUNTER — Ambulatory Visit (INDEPENDENT_AMBULATORY_CARE_PROVIDER_SITE_OTHER): Payer: Medicare Other | Admitting: *Deleted

## 2019-04-28 DIAGNOSIS — I4821 Permanent atrial fibrillation: Secondary | ICD-10-CM

## 2019-05-02 ENCOUNTER — Telehealth: Payer: Self-pay

## 2019-05-02 MED ORDER — METOPROLOL SUCCINATE ER 50 MG PO TB24: 50.0000 mg | ORAL_TABLET | Freq: Every day | ORAL | 3 refills | Status: DC

## 2019-05-02 NOTE — Telephone Encounter (Signed)
Refill sent.

## 2019-05-05 ENCOUNTER — Other Ambulatory Visit: Payer: Self-pay | Admitting: Cardiology

## 2019-05-18 ENCOUNTER — Ambulatory Visit (INDEPENDENT_AMBULATORY_CARE_PROVIDER_SITE_OTHER): Payer: Medicare Other | Admitting: *Deleted

## 2019-05-18 DIAGNOSIS — Z5181 Encounter for therapeutic drug level monitoring: Secondary | ICD-10-CM | POA: Diagnosis not present

## 2019-05-18 DIAGNOSIS — I480 Paroxysmal atrial fibrillation: Secondary | ICD-10-CM | POA: Diagnosis not present

## 2019-05-18 LAB — PROTIME-INR: INR: 3.3 — AB (ref 0.9–1.1)

## 2019-05-18 NOTE — Patient Instructions (Signed)
Hold coumadin tonight then resume 1 tablet daily except 1/2 tablet on Mondays. Recheck in 2 weeks Pt has a standing order in [place].  Instructions given to pt and spouse and they verbalized understanding

## 2019-05-24 ENCOUNTER — Telehealth: Payer: Self-pay

## 2019-05-24 ENCOUNTER — Other Ambulatory Visit: Payer: Self-pay

## 2019-05-24 MED ORDER — METOPROLOL SUCCINATE ER 50 MG PO TB24: 50.0000 mg | ORAL_TABLET | Freq: Every day | ORAL | 3 refills | Status: DC

## 2019-05-24 NOTE — Telephone Encounter (Signed)
error 

## 2019-05-31 ENCOUNTER — Other Ambulatory Visit: Payer: Self-pay | Admitting: Cardiology

## 2019-06-01 DIAGNOSIS — Z5181 Encounter for therapeutic drug level monitoring: Secondary | ICD-10-CM | POA: Diagnosis not present

## 2019-06-01 LAB — PROTIME-INR: INR: 3.1 — AB (ref 0.9–1.1)

## 2019-06-02 ENCOUNTER — Other Ambulatory Visit: Payer: Self-pay

## 2019-06-02 ENCOUNTER — Ambulatory Visit (INDEPENDENT_AMBULATORY_CARE_PROVIDER_SITE_OTHER): Payer: Medicare Other | Admitting: *Deleted

## 2019-06-02 DIAGNOSIS — Z5181 Encounter for therapeutic drug level monitoring: Secondary | ICD-10-CM

## 2019-06-02 DIAGNOSIS — I480 Paroxysmal atrial fibrillation: Secondary | ICD-10-CM

## 2019-06-02 MED ORDER — FUROSEMIDE 20 MG PO TABS: ORAL_TABLET | ORAL | 3 refills | Status: DC

## 2019-06-02 NOTE — Telephone Encounter (Signed)
Refilled 90 day supply lasix

## 2019-06-02 NOTE — Patient Instructions (Signed)
Take coumadin 1/2 tablet tonight then resume 1 tablet daily except 1/2 tablet on Mondays. Recheck in 3 weeks.  Planning to fly home next week.  Will call for appt when they get home. Pt has a standing order in [place].  Instructions given to pt and spouse and they verbalized understanding

## 2019-06-14 DIAGNOSIS — I509 Heart failure, unspecified: Secondary | ICD-10-CM | POA: Diagnosis not present

## 2019-06-14 DIAGNOSIS — N183 Chronic kidney disease, stage 3 (moderate): Secondary | ICD-10-CM | POA: Diagnosis not present

## 2019-06-21 ENCOUNTER — Other Ambulatory Visit: Payer: Self-pay | Admitting: Internal Medicine

## 2019-06-21 ENCOUNTER — Other Ambulatory Visit (HOSPITAL_COMMUNITY): Payer: Self-pay | Admitting: Internal Medicine

## 2019-06-21 ENCOUNTER — Other Ambulatory Visit: Payer: Self-pay

## 2019-06-21 ENCOUNTER — Other Ambulatory Visit: Payer: Medicare Other

## 2019-06-21 DIAGNOSIS — N183 Chronic kidney disease, stage 3 (moderate): Secondary | ICD-10-CM | POA: Diagnosis not present

## 2019-06-21 DIAGNOSIS — R102 Pelvic and perineal pain: Secondary | ICD-10-CM

## 2019-06-21 DIAGNOSIS — R6889 Other general symptoms and signs: Secondary | ICD-10-CM | POA: Diagnosis not present

## 2019-06-21 DIAGNOSIS — R1031 Right lower quadrant pain: Secondary | ICD-10-CM | POA: Diagnosis not present

## 2019-06-21 DIAGNOSIS — I5022 Chronic systolic (congestive) heart failure: Secondary | ICD-10-CM | POA: Diagnosis not present

## 2019-06-21 DIAGNOSIS — Z20822 Contact with and (suspected) exposure to covid-19: Secondary | ICD-10-CM

## 2019-06-23 LAB — NOVEL CORONAVIRUS, NAA: SARS-CoV-2, NAA: NOT DETECTED

## 2019-06-27 ENCOUNTER — Other Ambulatory Visit: Payer: Self-pay | Admitting: Cardiology

## 2019-06-28 ENCOUNTER — Ambulatory Visit (HOSPITAL_COMMUNITY): Payer: Medicare Other

## 2019-06-29 ENCOUNTER — Ambulatory Visit (HOSPITAL_COMMUNITY)
Admission: RE | Admit: 2019-06-29 | Discharge: 2019-06-29 | Disposition: A | Discharge status: Home / Self Care | Payer: Medicare Other | Source: Ambulatory Visit | Attending: Internal Medicine | Admitting: Internal Medicine

## 2019-06-29 ENCOUNTER — Other Ambulatory Visit: Payer: Self-pay

## 2019-06-29 DIAGNOSIS — R102 Pelvic and perineal pain: Secondary | ICD-10-CM | POA: Diagnosis not present

## 2019-06-29 DIAGNOSIS — N858 Other specified noninflammatory disorders of uterus: Secondary | ICD-10-CM | POA: Diagnosis not present

## 2019-07-01 DIAGNOSIS — H401133 Primary open-angle glaucoma, bilateral, severe stage: Secondary | ICD-10-CM | POA: Diagnosis not present

## 2019-07-01 DIAGNOSIS — H04123 Dry eye syndrome of bilateral lacrimal glands: Secondary | ICD-10-CM | POA: Diagnosis not present

## 2019-07-01 DIAGNOSIS — H2512 Age-related nuclear cataract, left eye: Secondary | ICD-10-CM | POA: Diagnosis not present

## 2019-07-01 DIAGNOSIS — H353130 Nonexudative age-related macular degeneration, bilateral, stage unspecified: Secondary | ICD-10-CM | POA: Diagnosis not present

## 2019-07-01 DIAGNOSIS — Z961 Presence of intraocular lens: Secondary | ICD-10-CM | POA: Diagnosis not present

## 2019-07-04 ENCOUNTER — Other Ambulatory Visit (INDEPENDENT_AMBULATORY_CARE_PROVIDER_SITE_OTHER): Payer: Self-pay | Admitting: Internal Medicine

## 2019-07-05 ENCOUNTER — Telehealth: Payer: Self-pay | Admitting: *Deleted

## 2019-07-05 NOTE — Telephone Encounter (Signed)
Spoke with Dr Heide Spark.  Patient is scheduled for cataract surgery on Thursday along with another procedure to help eye drain better.  Dr Katy Fitch wants pt to hold coumadin 2 days before procedure.  Pt will hold coumadin tonight and tomorrow night and resume Thursday night if OK with Dr Katy Fitch.  INR appt moved to Monday 07/11/19

## 2019-07-05 NOTE — Telephone Encounter (Signed)
Dr. Heide Spark called requesting to speak with Edrick Oh, RN.  Please call [phone].

## 2019-07-07 DIAGNOSIS — H2512 Age-related nuclear cataract, left eye: Secondary | ICD-10-CM | POA: Diagnosis not present

## 2019-07-11 ENCOUNTER — Ambulatory Visit (INDEPENDENT_AMBULATORY_CARE_PROVIDER_SITE_OTHER): Payer: Medicare Other | Admitting: Pharmacist Clinician (PhC)/ Clinical Pharmacy Specialist

## 2019-07-11 ENCOUNTER — Other Ambulatory Visit: Payer: Self-pay

## 2019-07-11 DIAGNOSIS — I480 Paroxysmal atrial fibrillation: Secondary | ICD-10-CM

## 2019-07-11 DIAGNOSIS — I4891 Unspecified atrial fibrillation: Secondary | ICD-10-CM

## 2019-07-11 DIAGNOSIS — Z5181 Encounter for therapeutic drug level monitoring: Secondary | ICD-10-CM | POA: Diagnosis not present

## 2019-07-11 LAB — POCT INR: INR: 2.2 (ref 2.0–3.0)

## 2019-07-15 DIAGNOSIS — M545 Low back pain: Secondary | ICD-10-CM | POA: Diagnosis not present

## 2019-07-15 DIAGNOSIS — M542 Cervicalgia: Secondary | ICD-10-CM | POA: Diagnosis not present

## 2019-07-22 DIAGNOSIS — L814 Other melanin hyperpigmentation: Secondary | ICD-10-CM | POA: Diagnosis not present

## 2019-07-22 DIAGNOSIS — D1801 Hemangioma of skin and subcutaneous tissue: Secondary | ICD-10-CM | POA: Diagnosis not present

## 2019-07-22 DIAGNOSIS — L821 Other seborrheic keratosis: Secondary | ICD-10-CM | POA: Diagnosis not present

## 2019-07-22 DIAGNOSIS — D692 Other nonthrombocytopenic purpura: Secondary | ICD-10-CM | POA: Diagnosis not present

## 2019-07-25 ENCOUNTER — Other Ambulatory Visit (INDEPENDENT_AMBULATORY_CARE_PROVIDER_SITE_OTHER): Payer: Self-pay | Admitting: Internal Medicine

## 2019-08-03 ENCOUNTER — Ambulatory Visit (INDEPENDENT_AMBULATORY_CARE_PROVIDER_SITE_OTHER): Payer: Medicare Other | Admitting: *Deleted

## 2019-08-03 ENCOUNTER — Other Ambulatory Visit: Payer: Self-pay

## 2019-08-03 DIAGNOSIS — I4891 Unspecified atrial fibrillation: Secondary | ICD-10-CM | POA: Diagnosis not present

## 2019-08-03 DIAGNOSIS — Z5181 Encounter for therapeutic drug level monitoring: Secondary | ICD-10-CM | POA: Diagnosis not present

## 2019-08-03 DIAGNOSIS — Z23 Encounter for immunization: Secondary | ICD-10-CM | POA: Diagnosis not present

## 2019-08-03 LAB — POCT INR: INR: 3.1 — AB (ref 2.0–3.0)

## 2019-08-03 NOTE — Patient Instructions (Signed)
Take coumadin 1/2 tablet tonight then resume 1 tablet daily except 1/2 tablet on Mondays. Recheck in 6 weeks.  Planning to go to [facility] in early September  Will call for appt when they get home.Instructions given to pt and spouse and they verbalized understanding

## 2019-08-09 ENCOUNTER — Other Ambulatory Visit: Payer: Self-pay

## 2019-08-09 DIAGNOSIS — Z20822 Contact with and (suspected) exposure to covid-19: Secondary | ICD-10-CM

## 2019-08-11 LAB — NOVEL CORONAVIRUS, NAA: SARS-CoV-2, NAA: NOT DETECTED

## 2019-09-01 DIAGNOSIS — Z5181 Encounter for therapeutic drug level monitoring: Secondary | ICD-10-CM | POA: Diagnosis not present

## 2019-09-01 DIAGNOSIS — I480 Paroxysmal atrial fibrillation: Secondary | ICD-10-CM | POA: Diagnosis not present

## 2019-09-01 LAB — POCT INR: INR: 2.5 (ref 2.0–3.0)

## 2019-09-05 ENCOUNTER — Ambulatory Visit (INDEPENDENT_AMBULATORY_CARE_PROVIDER_SITE_OTHER): Payer: Medicare Other | Admitting: *Deleted

## 2019-09-05 DIAGNOSIS — I4891 Unspecified atrial fibrillation: Secondary | ICD-10-CM | POA: Diagnosis not present

## 2019-09-05 DIAGNOSIS — Z5181 Encounter for therapeutic drug level monitoring: Secondary | ICD-10-CM | POA: Diagnosis not present

## 2019-09-05 NOTE — Patient Instructions (Signed)
Continue warfarin 1 tablet daily except 1/2 tablet on Mondays. Called pt/spouse with instructions.  F/U INR check made for 4 wks

## 2019-09-19 ENCOUNTER — Other Ambulatory Visit (HOSPITAL_COMMUNITY): Payer: Self-pay | Admitting: Internal Medicine

## 2019-09-19 ENCOUNTER — Telehealth: Payer: Self-pay | Admitting: Orthopaedic Surgery

## 2019-09-19 DIAGNOSIS — N63 Unspecified lump in unspecified breast: Secondary | ICD-10-CM

## 2019-09-19 NOTE — Telephone Encounter (Signed)
Patient and spouse, designated contact on file, Dr Heide Spark, called to request appointment which I offered, and Dr Heide Spark may need to speak directly with Dr Luna Glasgow. States patient is needing an injection in right sacro area, which she has had in past on left side, while in [place].  Please advise as to what records and pertinent information needed for patient to be seen and referred for this type of injection. Ph# (cell) [phone].

## 2019-09-20 NOTE — Telephone Encounter (Signed)
Send records about back.  Include any from [facility] system.

## 2019-09-21 NOTE — Telephone Encounter (Signed)
Had called back to patient, left message. Pt returned call this morning; aware to sign release of information for records as per Dr Brooke Bonito response.

## 2019-09-27 ENCOUNTER — Ambulatory Visit (INDEPENDENT_AMBULATORY_CARE_PROVIDER_SITE_OTHER): Payer: Medicare Other | Admitting: Orthopaedic Surgery

## 2019-09-27 ENCOUNTER — Other Ambulatory Visit: Payer: Self-pay

## 2019-09-27 ENCOUNTER — Encounter: Payer: Self-pay | Admitting: Orthopaedic Surgery

## 2019-09-27 ENCOUNTER — Ambulatory Visit (HOSPITAL_COMMUNITY)
Admission: RE | Admit: 2019-09-27 | Discharge: 2019-09-27 | Disposition: A | Discharge status: Home / Self Care | Payer: Medicare Other | Source: Ambulatory Visit | Attending: Orthopaedic Surgery | Admitting: Orthopaedic Surgery

## 2019-09-27 VITALS — BP 118/71 | HR 71 | Ht 61.0 in | Wt 117.0 lb

## 2019-09-27 DIAGNOSIS — N200 Calculus of kidney: Secondary | ICD-10-CM | POA: Diagnosis not present

## 2019-09-27 DIAGNOSIS — Z7901 Long term (current) use of anticoagulants: Secondary | ICD-10-CM

## 2019-09-27 DIAGNOSIS — M533 Sacrococcygeal disorders, not elsewhere classified: Secondary | ICD-10-CM | POA: Diagnosis not present

## 2019-09-27 DIAGNOSIS — M25551 Pain in right hip: Secondary | ICD-10-CM

## 2019-09-27 DIAGNOSIS — M160 Bilateral primary osteoarthritis of hip: Secondary | ICD-10-CM | POA: Diagnosis not present

## 2019-09-27 NOTE — Progress Notes (Signed)
Patient Alicia Harrington, female DOB:[birth year], 85 y.o. [id number]  Chief Complaint  Patient presents with  . Back Pain    right sacral area     HPI  Alicia Harrington is a 85 y.o. female who has developed pain of the right SI joint area.  She had this problem on the left side in December of last year.  They were in [place] in [place] and [place] [facility] [place]  She had injection of the left SI joint then and did well.  I have copies of the notes and have reviewed them.  She was in [facility] recently and developed pain in the right SI joint.  It got worse.  She is back home in [place] now and is slightly better but would like to arrange injection of the SI joint.  It would need to be done under fluoroscopy.    I will get CT scan of the SI joints and make sure no insufficiency fracture is present.  Then will arrange to be seen at [facility] for injection.   Body mass index is 22.11 kg/m.  ROS  Review of Systems  Constitutional: Positive for activity change.  Musculoskeletal: Positive for arthralgias, back pain and myalgias.  All other systems reviewed and are negative.   All other systems reviewed and are negative.  The following is a summary of the past history medically, past history surgically, known current medicines, social history and family history.  This information is gathered electronically by the computer from prior information and documentation.  I review this each visit and have found including this information at this point in the chart is beneficial and informative.    Past Medical History:  Diagnosis Date  . Adenocarcinoma, breast ([place])    treated with lumpectomy, node dissection, chemo and RT  . Ataxia   . Breast cancer ([place]) 2009   IDC+DCIS of right breast; triple positive  . Cardiomyopathy 2009   possibly secondary to Adriamycin; EF 40% in 12/09 and 50% 3/10; pulmonary edema in 2009  .  Chronic anticoagulation 04/16/2011  . Chronic kidney disease (CKD), stage I    when on diuretic with creatinine of 1.6  . Complication of anesthesia    patient vomits with demerol  . DJD (degenerative joint disease)    right knee and lumbosacral spine  . Hearing impairment   . History of angina 2001   normal cornary arteries in 2001  . Hypertension   . Macular degeneration, dry    + cataracts  . Neuropathy    fingers and toes  . PONV (postoperative nausea and vomiting)   . Retinal macular atrophy   . Sick sinus syndrome ([facility])    Paroxysmal to permanent AF; initially first degree AV block also noted    Past Surgical History:  Procedure Laterality Date  . APPENDECTOMY    . CATARACT EXTRACTION W/PHACO  10/11/2012   Procedure: CATARACT EXTRACTION PHACO AND INTRAOCULAR LENS PLACEMENT (IOC);  Surgeon: Williams Che, MD;  Location: [facility] ORS;  Service: Ophthalmology;  Laterality: Right;  CDE:10.82  . COLONOSCOPY  02/11/2012   Colonoscopy plus polypectomy x2, [facility]  . COLONOSCOPY N/A 03/30/2017   Procedure: COLONOSCOPY;  Surgeon: Rogene Houston, MD;  Location: [facility] ENDO SUITE;  Service: Endoscopy;  Laterality: N/A;  830  . DILATION AND CURETTAGE OF UTERUS    . ESOPHAGEAL DILATION N/A 01/19/2015   Procedure: ESOPHAGEAL DILATION;  Surgeon: Rogene Houston, MD;  Location: [facility] END[facility] SU[facility];  Service: Endoscopy;  Laterality: N/A;  . ESOPHAGOGASTRODUODENOSCOPY N/A 01/19/2015   Procedure: ESOPHAGOGASTRODUODENOSCOPY (EGD);  Surgeon: Rogene Houston, MD;  Location: [facility] END[facility] SU[facility];  Service: Endoscopy;  Laterality: N/A;  910  . MASTECTOMY PARTIAL / LUMPECTOMY W/ AXILLARY LYMPHADENECTOMY  2009   Carcinoma of the breast  . MICRODISCECTOMY LUMBAR  2009   lumbosacral spine  . MIDDLE EAR SURGERY     Left  . POLYPECTOMY  03/30/2017   Procedure: POLYPECTOMY;  Surgeon: Rogene Houston, MD;  Location: [facility];  Service: Endoscopy;;  colon  . PORT-A-CATH REMOVAL    . TONSILLECTOMY      Family History   Problem Relation Age of Onset  . Heart failure Mother   . Bladder Cancer Maternal Uncle        smoker  . Ovarian cancer Maternal Grandmother        dx. 59s; +surgery  . Stroke Brother   . Breast cancer Maternal Aunt        dx. late 28s  . Breast cancer Cousin 68  . Prostate cancer Cousin 8  . Cancer Cousin        unspecified type  . Stroke Paternal Uncle     Social History Social History   Tobacco Use  . Smoking status: Former Smoker    Types: Cigarettes    Start date: 11/24/1954    Quit date: 12/23/1955    Years since quitting: 63.8  . Smokeless tobacco: Never Used  . Tobacco comment: quit at age 40; 0.5 pack per wk for one year  Substance Use Topics  . Alcohol use: Yes    Alcohol/week: 0.0 standard drinks    Comment: one glass of wine per day  . Drug use: No    Allergies  Allergen Reactions  . Altace [Ramipril] Rash    Current Outpatient Medications  Medication Sig Dispense Refill  . cholecalciferol (VITAMIN D) 1000 UNITS tablet Take 1,000 Units by mouth daily.      . dorzolamide (TRUSOPT) 2 % ophthalmic solution Place 1 drop into both eyes 2 (two) times daily.     . furosemide (LASIX) 20 MG tablet Take 3 tablets by mouth daily each morning or as needed 270 tablet 3  . hyoscyamine (LEVSIN SL) 0.125 MG SL tablet DISSOLVE 1 TABLET UNDER THE TONGUE TWICE A DAY AS NEEDED 180 tablet 0  . hyoscyamine (LEVSIN, ANASPAZ) 0.125 MG tablet Take 1 tablet (0.125 mg total) by mouth 2 (two) times daily as needed. 180 tablet 0  . latanoprost (XALATAN) 0.005 % ophthalmic solution Place 1 drop into both eyes at bedtime.   3  . metoprolol succinate (TOPROL XL) 25 MG 24 hr tablet Take 25 mg daily in addition to 50 mg , fora total of 75 mg daily 90 tablet 3  . metoprolol succinate (TOPROL-XL) 50 MG 24 hr tablet Take 1 tablet (50 mg total) by mouth daily. Take with or immediately following a meal. 90 tablet 3  . ondansetron (ZOFRAN) 8 MG tablet Take 1 tablet (8 mg total) by mouth every 8  (eight) hours as needed for nausea or vomiting. 30 tablet 1  . potassium chloride SA (K-DUR) 20 MEQ tablet Take 1 tablet (20 mEq total) by mouth daily. 90 tablet 3  . traMADol (ULTRAM) 50 MG tablet Take 25 mg by mouth daily as needed for moderate pain. Only takes if necessary    . valsartan (DIOVAN) 80 MG tablet Take 1 tablet (80 mg  total) by mouth daily. 90 tablet 3  . warfarin (COUMADIN) 5 MG tablet Take 1 tablet daily except 1/2 tablet on Mondays or as directed by Anticoagulation Clinic 90 tablet 3  . vitamin B-12 (CYANOCOBALAMIN) 1000 MCG tablet Take 1,000 mcg by mouth daily.     No current facility-administered medications for this visit.      Physical Exam  Blood pressure 118/71, pulse 71, height 5\' 1"  (1.549 m), weight 117 lb (53.1 kg).  Constitutional: overall normal hygiene, normal nutrition, well developed, normal grooming, normal body habitus. Assistive device:none  Musculoskeletal: gait and station Limp none, muscle tone and strength are normal, no tremors or atrophy is present.  .  Neurological: coordination overall normal.  Deep tendon reflex/nerve stretch intact.  Sensation normal.  Cranial nerves II-XII intact.   Skin:   Normal overall no scars, lesions, ulcers or rashes. No psoriasis.  Psychiatric: Alert and oriented x 3.  Recent memory intact, remote memory unclear.  Normal mood and affect. Well groomed.  Good eye contact.  Cardiovascular: overall no swelling, no varicosities, no edema bilaterally, normal temperatures of the legs and arms, no clubbing, cyanosis and good capillary refill.  Lymphatic: palpation is normal.  All other systems reviewed and are negative   The patient has been educated about the nature of the problem(s) and counseled on treatment options.  The patient appeared to understand what I have discussed and is in agreement with it.  Encounter Diagnoses  Name Primary?  . Sacral pain Yes  . Pain in right hip    . Adequate anticoagulation on  anticoagulant therapy     PLAN Call if any problems.  Precautions discussed.  Continue current medications.   Return to clinic To get CT scan now, then arrange for SI joint injection in [facility]   Electronically [place], MD 11/3/20203:55 PM

## 2019-09-29 ENCOUNTER — Ambulatory Visit: Payer: Self-pay

## 2019-09-29 ENCOUNTER — Ambulatory Visit (INDEPENDENT_AMBULATORY_CARE_PROVIDER_SITE_OTHER): Payer: Medicare Other | Admitting: Physical Medicine and Rehabilitation

## 2019-09-29 ENCOUNTER — Other Ambulatory Visit: Payer: Self-pay

## 2019-09-29 ENCOUNTER — Encounter: Payer: Self-pay | Admitting: Physical Medicine and Rehabilitation

## 2019-09-29 DIAGNOSIS — M461 Sacroiliitis, not elsewhere classified: Secondary | ICD-10-CM

## 2019-09-29 NOTE — Progress Notes (Signed)
Alicia Harrington - 85 y.o. female MRN [id number]  Date of birth: Nov 13, 1932  Office Visit Note: Visit Date: 09/29/2019 PCP: [name], MD Referred by: [name], MD  Subjective: No chief complaint on file.  HPI:  Alicia Harrington is a 85 y.o. female who comes in today At the request of Dr. Sanjuana Kava for diagnostic and hopefully therapeutic right sacroiliac joint injection with fluoroscopic guidance.  The patient has failed conservative care including home exercise, medications, time and activity modification.  This injection will be diagnostic and hopefully therapeutic.  Please see requesting physician notes for further details and justification.   ROS Otherwise per HPI.  Assessment & Plan: Visit Diagnoses:  1. Sacroiliitis ([place])     Plan: No additional findings.   Meds & Orders: No orders of the defined types were placed in this encounter.   Orders Placed This Encounter  Procedures  . Sacroiliac Joint Inj  . C-ARM NO Order    Follow-up: No follow-ups on file.   Procedures: Sacroiliac Joint Inj (Right ) on 09/29/2019 2:13 PM Indications: pain and diagnostic evaluation Details: 22 G 3.5 in needle, fluoroscopy-guided posterior approach Medications: 2 mL bupivacaine 0.5 %; 80 mg methylPREDNISolone acetate 80 MG/ML Outcome: tolerated well, no immediate complications  There was excellent flow of contrast producing a partial arthrogram of the sacroiliac joint.  Procedure, treatment alternatives, risks and benefits explained, specific risks discussed. Consent was given by the patient. Immediately prior to procedure a time out was called to verify the correct patient, procedure, equipment, support staff and site/side marked as required. Patient was prepped and draped in the usual sterile fashion.      No notes on file   Clinical History: CT PELVIS WITHOUT CONTRAST  TECHNIQUE: Multidetector CT imaging of the pelvis was performed following the standard protocol  without intravenous contrast.  COMPARISON:  Pelvic ultrasound June 29, 2019, CT abdomen pelvis 10/18/2018  FINDINGS: Urinary Tract: Mild bilateral symmetric perinephric stranding, a nonspecific finding though may correlate with either age or decreased renal function. Nonobstructing calculi seen in the lower pole left kidney. No distal ureteral dilatation or calculi. Urinary bladder is largely decompressed at the time of exam and therefore poorly evaluated by CT imaging.  Bowel: No bowel wall thickening or dilatation. No visible obstruction.  Vascular/Lymphatic: Atherosclerotic calcifications within the distal aorta and bifurcation. No visible aneurysm or ectasia. No suspicious or enlarged lymph nodes in the included lymphatic chains.  Reproductive: Slightly retroverted uterus with a lobular contour suggesting uterine fibroids as seen on comparison pelvic ultrasound.  Other: No abdominopelvic free fluid or free gas. No bowel containing hernias.  Musculoskeletal: No acute fracture or traumatic malalignment is seen within the pelvis.  Discogenic and facet degenerative changes present in the included portions of the lower lumbar spine. Grade 1 anterolisthesis L4 on L5. Facet hypertrophic change and posterior spurring results in moderate to severe left neural foraminal narrowing at L5-S1.  Moderate arthrosis present in both SI joints and at the symphysis pubis. Additional moderate degenerative changes present in both hips with near bone on bone contact, subchondral sclerosis and cystic change with larger geode formation in the femoral head neck junctions. Small calcified degenerative os acetabuli are present bilaterally. Mild thickening and synovitis of both hip joint capsules, right slightly greater than left. Bilateral enthesopathic mineralization is noted at the attachment site of the pectineus muscles upon the anterior pubic bodies.  IMPRESSION: 1. No acute  osseous abnormality. 2. Moderate bilateral hip arthrosis with mild  synovitis. 3. Moderate bilateral SI joint arthrosis and degenerative change at the symphysis pubis. 4. Moderate discogenic and facet degenerative changes in the lumbar spine, maximal L5-S1 with moderate to severe left neural foraminal narrowing. 5. Nonobstructing left nephrolithiasis. 6. Aortic Atherosclerosis (ICD10-I70.0).   Electronically Signed   By: Lovena Le M.D.   On: 09/27/2019 16:08     Objective:  VS:  HT:    WT:   BMI:     BP:   HR: bpm  TEMP: ( )  RESP:  Physical Exam  Ortho Exam Imaging: No results found.

## 2019-09-29 NOTE — Progress Notes (Signed)
 .  Numeric Pain Rating Scale and Functional Assessment Average Pain 5   In the last MONTH (on 0-10 scale) has pain interfered with the following?  1. General activity like being  able to carry out your everyday physical activities such as walking, climbing stairs, carrying groceries, or moving a chair?  Rating(6)   +Driver, -BT, -Dye Allergies.  

## 2019-10-03 ENCOUNTER — Other Ambulatory Visit: Payer: Self-pay

## 2019-10-03 ENCOUNTER — Ambulatory Visit (INDEPENDENT_AMBULATORY_CARE_PROVIDER_SITE_OTHER): Payer: Medicare Other | Admitting: *Deleted

## 2019-10-03 DIAGNOSIS — Z5181 Encounter for therapeutic drug level monitoring: Secondary | ICD-10-CM | POA: Diagnosis not present

## 2019-10-03 DIAGNOSIS — I4891 Unspecified atrial fibrillation: Secondary | ICD-10-CM

## 2019-10-03 LAB — POCT INR: INR: 3.3 — AB (ref 2.0–3.0)

## 2019-10-03 NOTE — Patient Instructions (Signed)
Hold warfarin tonight then resume 1 tablet daily except 1/2 tablet on Mondays. F/U INR check made for 3 wks

## 2019-10-06 DIAGNOSIS — H903 Sensorineural hearing loss, bilateral: Secondary | ICD-10-CM | POA: Diagnosis not present

## 2019-10-06 DIAGNOSIS — M47892 Other spondylosis, cervical region: Secondary | ICD-10-CM | POA: Diagnosis not present

## 2019-10-06 DIAGNOSIS — H6123 Impacted cerumen, bilateral: Secondary | ICD-10-CM | POA: Diagnosis not present

## 2019-10-06 DIAGNOSIS — H8002 Otosclerosis involving oval window, nonobliterative, left ear: Secondary | ICD-10-CM | POA: Diagnosis not present

## 2019-10-06 DIAGNOSIS — H905 Unspecified sensorineural hearing loss: Secondary | ICD-10-CM | POA: Diagnosis not present

## 2019-10-11 ENCOUNTER — Other Ambulatory Visit (HOSPITAL_COMMUNITY): Payer: Medicare Other

## 2019-10-11 ENCOUNTER — Encounter (HOSPITAL_COMMUNITY): Payer: Medicare Other

## 2019-10-19 DIAGNOSIS — N6489 Other specified disorders of breast: Secondary | ICD-10-CM | POA: Diagnosis not present

## 2019-10-24 ENCOUNTER — Ambulatory Visit (INDEPENDENT_AMBULATORY_CARE_PROVIDER_SITE_OTHER): Payer: Medicare Other | Admitting: *Deleted

## 2019-10-24 ENCOUNTER — Other Ambulatory Visit: Payer: Self-pay

## 2019-10-24 DIAGNOSIS — I4891 Unspecified atrial fibrillation: Secondary | ICD-10-CM

## 2019-10-24 DIAGNOSIS — Z5181 Encounter for therapeutic drug level monitoring: Secondary | ICD-10-CM | POA: Diagnosis not present

## 2019-10-24 LAB — POCT INR: INR: 4.1 — AB (ref 2.0–3.0)

## 2019-10-24 NOTE — Patient Instructions (Signed)
Hold warfarin tonight then decrease dose to 1 tablet daily except 1/2 tablet on Mondays, Wednesdays and Fridays. F/U INR check made for 2 wks

## 2019-11-07 ENCOUNTER — Other Ambulatory Visit: Payer: Self-pay

## 2019-11-07 ENCOUNTER — Ambulatory Visit (INDEPENDENT_AMBULATORY_CARE_PROVIDER_SITE_OTHER): Payer: Medicare Other | Admitting: *Deleted

## 2019-11-07 DIAGNOSIS — Z5181 Encounter for therapeutic drug level monitoring: Secondary | ICD-10-CM

## 2019-11-07 DIAGNOSIS — I4891 Unspecified atrial fibrillation: Secondary | ICD-10-CM | POA: Diagnosis not present

## 2019-11-07 LAB — POCT INR: INR: 2.5 (ref 2.0–3.0)

## 2019-11-07 NOTE — Patient Instructions (Signed)
Continue warfarin 1 tablet daily except 1/2 tablet on Mondays, Wednesdays and Fridays. F/U INR check made for 3 wks

## 2019-11-21 ENCOUNTER — Other Ambulatory Visit: Payer: Self-pay | Admitting: Cardiology

## 2019-11-21 ENCOUNTER — Telehealth: Payer: Self-pay | Admitting: Orthopaedic Surgery

## 2019-11-21 DIAGNOSIS — M533 Sacrococcygeal disorders, not elsewhere classified: Secondary | ICD-10-CM

## 2019-11-21 NOTE — Telephone Encounter (Signed)
Spoke to Dr Terald Sleeper ok to order the injection, order sent.

## 2019-11-21 NOTE — Telephone Encounter (Signed)
Is it okay to repeat the injection ?

## 2019-11-21 NOTE — Telephone Encounter (Signed)
Patient called to relay that she would like to have another injection by the provider whom Dr Luna Glasgow referred. Chart notes indicate patient was seen by Dr Magnus Sinning 09/29/19 for sacroiliac injection. Please advise if new order or referral needed, or if patient may call there directly to schedule. Best phone number is husband, designated contact, Dr Maudry Diego cell# [phone]

## 2019-11-22 NOTE — Telephone Encounter (Signed)
I discussed with Dr Heide Spark, he states he would like to bring her in here for you to discuss with him. I have made appointment for Thursday am, they can only do am appointments

## 2019-11-22 NOTE — Telephone Encounter (Signed)
Patient states when Dr Ernestina Patches office contacted her the last injection did not help at all   Dr Ernestina Patches suggested If it did not help then no need to repeat, I could try S1 trans epidural or they could get MRI or Ct scan Lspine. If really felt to be SI joint they may want to have [place] do with CT guidance.  What would you like to do?  Ok to try S1 trans epidural ?

## 2019-11-24 ENCOUNTER — Other Ambulatory Visit: Payer: Self-pay

## 2019-11-24 ENCOUNTER — Ambulatory Visit (INDEPENDENT_AMBULATORY_CARE_PROVIDER_SITE_OTHER): Payer: Medicare Other | Admitting: Orthopaedic Surgery

## 2019-11-24 ENCOUNTER — Encounter: Payer: Self-pay | Admitting: Orthopaedic Surgery

## 2019-11-24 ENCOUNTER — Other Ambulatory Visit: Payer: Self-pay | Admitting: Orthopaedic Surgery

## 2019-11-24 VITALS — BP 118/71 | HR 102 | Ht 61.0 in | Wt 118.0 lb

## 2019-11-24 DIAGNOSIS — M533 Sacrococcygeal disorders, not elsewhere classified: Secondary | ICD-10-CM

## 2019-11-24 NOTE — Progress Notes (Signed)
Patient [name], female DOB:February 11, 1932, 85 y.o. [id number]  Chief Complaint  Patient presents with  . Back Pain    right sided sacral/ low back pain discuss injection options / no better after injection right SI area    HPI  Alicia Harrington is a 85 y.o. female who continues to have SI joint pain after the injection several weeks ago. She has pain also in the right lower quadrant of the abdomen.  She has no new trauma.  She has difficulty walking secondary to the pain.  She has no trauma, no redness.  I will arrange for CT SI joint injection in [place].   Body mass index is 22.3 kg/m.  ROS  Review of Systems  Constitutional: Positive for activity change.  Musculoskeletal: Positive for arthralgias, back pain and myalgias.  All other systems reviewed and are negative.   All other systems reviewed and are negative.  The following is a summary of the past history medically, past history surgically, known current medicines, social history and family history.  This information is gathered electronically by the computer from prior information and documentation.  I review this each visit and have found including this information at this point in the chart is beneficial and informative.    Past Medical History:  Diagnosis Date  . Adenocarcinoma, breast ([place])    treated with lumpectomy, node dissection, chemo and RT  . Ataxia   . Breast cancer ([place]) 2009   IDC+DCIS of right breast; triple positive  . Cardiomyopathy 2009   possibly secondary to Adriamycin; EF 40% in 12/09 and 50% 3/10; pulmonary edema in 2009  . Chronic anticoagulation 04/16/2011  . Chronic kidney disease (CKD), stage I    when on diuretic with creatinine of 1.6  . Complication of anesthesia    patient vomits with demerol  . DJD (degenerative joint disease)    right knee and lumbosacral spine  . Hearing impairment   . History of angina 2001   normal cornary arteries in 2001  . Hypertension    . Macular degeneration, dry    + cataracts  . Neuropathy    fingers and toes  . PONV (postoperative nausea and vomiting)   . Retinal macular atrophy   . Sick sinus syndrome (HCC)    Paroxysmal to permanent AF; initially first degree AV block also noted    Past Surgical History:  Procedure Laterality Date  . APPENDECTOMY    . CATARACT EXTRACTION W/PHACO  10/11/2012   Procedure: CATARACT EXTRACTION PHACO AND INTRAOCULAR LENS PLACEMENT (IOC);  Surgeon: Williams Che, MD;  Location: [facility];  Service: Ophthalmology;  Laterality: Right;  CDE:10.82  . COLONOSCOPY  02/11/2012   Colonoscopy plus polypectomy x2, [facility]  . COLONOSCOPY N/A 03/30/2017   Procedure: COLONOSCOPY;  Surgeon: Rogene Houston, MD;  Location: [facility];  Service: Endoscopy;  Laterality: N/A;  830  . DILATION AND CURETTAGE OF UTERUS    . ESOPHAGEAL DILATION N/A 01/19/2015   Procedure: ESOPHAGEAL DILATION;  Surgeon: Rogene Houston, MD;  [facility]: [facility];  Service: Endoscopy;  Laterality: N/A;  . ESOPHAGOGASTRODUODENOSCOPY N/A 01/19/2015   Procedure: ESOPHAGOGASTRODUODENOSCOPY (EGD);  Surgeon: Rogene Houston, MD;  [facility];  Service: Endoscopy;  Laterality: N/A;  910  . MASTECTOMY PARTIAL / LUMPECTOMY W/ AXILLARY LYMPHADENECTOMY  2009   Carcinoma of the breast  . MICRODISCECTOMY LUMBAR  2009   lumbosacral spine  . MIDDLE EAR SURGERY     Left  .  POLYPECTOMY  03/30/2017   Procedure: POLYPECTOMY;  Surgeon: Rogene Houston, MD;  [facility]: [facility];  Service: Endoscopy;;  colon  . PORT-A-CATH REMOVAL    . TONSILLECTOMY      Family History  Problem Relation Age of Onset  . Heart failure Mother   . Bladder Cancer Maternal Uncle        smoker  . Ovarian cancer Maternal Grandmother        dx. 51s; +surgery  . Stroke Brother   . Breast cancer Maternal Aunt        dx. late 68s  . Breast cancer Cousin 53  . Prostate cancer Cousin 60  . Cancer Cousin        unspecified type  . Stroke  Paternal Uncle     Social History Social History   Tobacco Use  . Smoking status: Former Smoker    Types: Cigarettes    Start date: 11/24/1954    Quit date: 12/23/1955    Years since quitting: 63.9  . Smokeless tobacco: Never Used  . Tobacco comment: quit at age 46; 0.5 pack per wk for one year  Substance Use Topics  . Alcohol use: Yes    Alcohol/week: 0.0 standard drinks    Comment: one glass of wine per day  . Drug use: No    Allergies  Allergen Reactions  . Altace [Ramipril] Rash    Current Outpatient Medications  Medication Sig Dispense Refill  . cholecalciferol (VITAMIN D) 1000 UNITS tablet Take 1,000 Units by mouth daily.      . dorzolamide (TRUSOPT) 2 % ophthalmic solution Place 1 drop into both eyes 2 (two) times daily.     . furosemide (LASIX) 20 MG tablet TAKE 3 TABLETS BY MOUTH DAILY EACH MORNING OR AS NEEDED. 90 tablet 0  . hyoscyamine (LEVSIN SL) 0.125 MG SL tablet DISSOLVE 1 TABLET UNDER THE TONGUE TWICE A DAY AS NEEDED 180 tablet 0  . hyoscyamine (LEVSIN, ANASPAZ) 0.125 MG tablet Take 1 tablet (0.125 mg total) by mouth 2 (two) times daily as needed. 180 tablet 0  . latanoprost (XALATAN) 0.005 % ophthalmic solution Place 1 drop into both eyes at bedtime.   3  . metoprolol succinate (TOPROL XL) 25 MG 24 hr tablet Take 25 mg daily in addition to 50 mg , fora total of 75 mg daily 90 tablet 3  . metoprolol succinate (TOPROL-XL) 50 MG 24 hr tablet Take 1 tablet (50 mg total) by mouth daily. Take with or immediately following a meal. 90 tablet 3  . ondansetron (ZOFRAN) 8 MG tablet Take 1 tablet (8 mg total) by mouth every 8 (eight) hours as needed for nausea or vomiting. 30 tablet 1  . potassium chloride SA (K-DUR) 20 MEQ tablet Take 1 tablet (20 mEq total) by mouth daily. 90 tablet 3  . traMADol (ULTRAM) 50 MG tablet Take 25 mg by mouth daily as needed for moderate pain. Only takes if necessary    . valsartan (DIOVAN) 80 MG tablet Take 1 tablet (80 mg total) by mouth  daily. 90 tablet 3  . vitamin B-12 (CYANOCOBALAMIN) 1000 MCG tablet Take 1,000 mcg by mouth daily.    Marland Kitchen warfarin (COUMADIN) 5 MG tablet Take 1 tablet daily except 1/2 tablet on Mondays or as directed by Anticoagulation Clinic 90 tablet 3   No current facility-administered medications for this visit.     Physical Exam  Blood pressure 118/71, pulse (!) 102, height 5\' 1"  (1.549 m), weight 118 lb (  53.5 kg).  Constitutional: overall normal hygiene, normal nutrition, well developed, normal grooming, normal body habitus. Assistive device:none  Musculoskeletal: gait and station Limp right, muscle tone and strength are normal, no tremors or atrophy is present.  .  Neurological: coordination overall normal.  Deep tendon reflex/nerve stretch intact.  Sensation normal.  Cranial nerves II-XII intact.   Skin:   Normal overall no scars, lesions, ulcers or rashes. No psoriasis.  Psychiatric: Alert and oriented x 3.  Recent memory intact, remote memory unclear.  Normal mood and affect. Well groomed.  Good eye contact.  Cardiovascular: overall no swelling, no varicosities, no edema bilaterally, normal temperatures of the legs and arms, no clubbing, cyanosis and good capillary refill.  Lymphatic: palpation is normal.  She has tenderness over the right SI joint and limp to the right.  All other systems reviewed and are negative   The patient has been educated about the nature of the problem(s) and counseled on treatment options.  The patient appeared to understand what I have discussed and is in agreement with it.  Encounter Diagnosis  Name Primary?  . Sacral pain Yes    PLAN Call if any problems.  Precautions discussed.  Continue current medications.   Return to clinic call if worse after the injection in [place].   Electronically Signed Sanjuana Kava, MD 12/31/20209:20 AM

## 2019-11-28 ENCOUNTER — Other Ambulatory Visit: Payer: Self-pay

## 2019-11-28 ENCOUNTER — Ambulatory Visit (INDEPENDENT_AMBULATORY_CARE_PROVIDER_SITE_OTHER): Payer: Medicare Other | Admitting: *Deleted

## 2019-11-28 ENCOUNTER — Telehealth: Payer: Self-pay | Admitting: *Deleted

## 2019-11-28 DIAGNOSIS — I4891 Unspecified atrial fibrillation: Secondary | ICD-10-CM

## 2019-11-28 DIAGNOSIS — Z5181 Encounter for therapeutic drug level monitoring: Secondary | ICD-10-CM

## 2019-11-28 LAB — POCT INR: INR: 2.2 (ref 2.0–3.0)

## 2019-11-28 NOTE — Telephone Encounter (Signed)
Back injection scheduled for 12/06/19.  Pt needs INR checked on 12/05/19.  Appt made.

## 2019-11-28 NOTE — Telephone Encounter (Signed)
Please call pt's husband

## 2019-11-28 NOTE — Patient Instructions (Signed)
Continue warfarin 1 tablet daily except 1/2 tablet on Mondays, Wednesdays and Fridays. F/U INR check made for 4 wks

## 2019-12-05 ENCOUNTER — Ambulatory Visit (INDEPENDENT_AMBULATORY_CARE_PROVIDER_SITE_OTHER): Payer: Medicare Other | Admitting: *Deleted

## 2019-12-05 ENCOUNTER — Other Ambulatory Visit: Payer: Self-pay

## 2019-12-05 ENCOUNTER — Encounter: Payer: Self-pay | Admitting: *Deleted

## 2019-12-05 DIAGNOSIS — I4891 Unspecified atrial fibrillation: Secondary | ICD-10-CM | POA: Diagnosis not present

## 2019-12-05 DIAGNOSIS — Z5181 Encounter for therapeutic drug level monitoring: Secondary | ICD-10-CM

## 2019-12-05 DIAGNOSIS — Z23 Encounter for immunization: Secondary | ICD-10-CM | POA: Diagnosis not present

## 2019-12-05 LAB — POCT INR: INR: 1.2 — AB (ref 2.0–3.0)

## 2019-12-05 NOTE — Patient Instructions (Signed)
Been off warfarin since 12/02/19 for back injection 12/06/19. Restart warfarin night of procedure taking 1 tablet x 3 days then resume 1 tablet daily except 1/2 tablet on Mondays, Wednesdays and Fridays. F/U INR check made for 2 wks

## 2019-12-06 ENCOUNTER — Ambulatory Visit
Admission: RE | Admit: 2019-12-06 | Discharge: 2019-12-06 | Disposition: A | Discharge status: Home / Self Care | Payer: Medicare Other | Source: Ambulatory Visit | Attending: Orthopaedic Surgery | Admitting: Orthopaedic Surgery

## 2019-12-06 DIAGNOSIS — M25551 Pain in right hip: Secondary | ICD-10-CM | POA: Diagnosis not present

## 2019-12-06 DIAGNOSIS — M533 Sacrococcygeal disorders, not elsewhere classified: Secondary | ICD-10-CM

## 2019-12-06 DIAGNOSIS — M79651 Pain in right thigh: Secondary | ICD-10-CM | POA: Diagnosis not present

## 2019-12-19 MED ORDER — BUPIVACAINE HCL 0.5 % IJ SOLN
2.0000 mL | INTRAMUSCULAR | Status: AC | PRN
  Administered 2019-09-29: 2 mL via INTRA_ARTICULAR

## 2019-12-19 MED ORDER — METHYLPREDNISOLONE ACETATE 80 MG/ML IJ SUSP
80.0000 mg | INTRAMUSCULAR | Status: AC | PRN
  Administered 2019-09-29: 80 mg via INTRA_ARTICULAR

## 2019-12-20 ENCOUNTER — Other Ambulatory Visit: Payer: Self-pay

## 2019-12-20 ENCOUNTER — Ambulatory Visit (INDEPENDENT_AMBULATORY_CARE_PROVIDER_SITE_OTHER): Payer: Medicare Other | Admitting: *Deleted

## 2019-12-20 DIAGNOSIS — Z5181 Encounter for therapeutic drug level monitoring: Secondary | ICD-10-CM

## 2019-12-20 DIAGNOSIS — I4891 Unspecified atrial fibrillation: Secondary | ICD-10-CM

## 2019-12-20 LAB — POCT INR: INR: 1.8 — AB (ref 2.0–3.0)

## 2019-12-20 NOTE — Patient Instructions (Signed)
Was off warfarin for back injection 12/06/19. Take warfarin 1 1/2 tablets tonight then resume 1 tablet daily except 1/2 tablet on Mondays, Wednesdays and Fridays. Recheck INR 2-3 wks

## 2019-12-22 ENCOUNTER — Other Ambulatory Visit: Payer: Self-pay | Admitting: Cardiology

## 2019-12-28 ENCOUNTER — Other Ambulatory Visit: Payer: Self-pay | Admitting: Cardiology

## 2019-12-29 DIAGNOSIS — R1031 Right lower quadrant pain: Secondary | ICD-10-CM | POA: Diagnosis not present

## 2020-01-02 ENCOUNTER — Other Ambulatory Visit: Payer: Self-pay | Admitting: Internal Medicine

## 2020-01-02 ENCOUNTER — Other Ambulatory Visit (HOSPITAL_COMMUNITY): Payer: Self-pay | Admitting: Internal Medicine

## 2020-01-02 DIAGNOSIS — R1031 Right lower quadrant pain: Secondary | ICD-10-CM

## 2020-01-02 DIAGNOSIS — R140 Abdominal distension (gaseous): Secondary | ICD-10-CM

## 2020-01-03 DIAGNOSIS — Z23 Encounter for immunization: Secondary | ICD-10-CM | POA: Diagnosis not present

## 2020-01-06 ENCOUNTER — Ambulatory Visit (HOSPITAL_COMMUNITY)
Admission: RE | Admit: 2020-01-06 | Discharge: 2020-01-06 | Disposition: A | Discharge status: Home / Self Care | Payer: Medicare Other | Source: Ambulatory Visit | Attending: Internal Medicine | Admitting: Internal Medicine

## 2020-01-06 ENCOUNTER — Other Ambulatory Visit: Payer: Self-pay

## 2020-01-06 DIAGNOSIS — R140 Abdominal distension (gaseous): Secondary | ICD-10-CM | POA: Diagnosis not present

## 2020-01-06 DIAGNOSIS — R1031 Right lower quadrant pain: Secondary | ICD-10-CM

## 2020-01-06 LAB — POCT I-STAT CREATININE: Creatinine, Ser: 1 mg/dL (ref 0.44–1.00)

## 2020-01-06 MED ORDER — IOHEXOL 300 MG/ML  SOLN
100.0000 mL | Freq: Once | INTRAMUSCULAR | Status: AC | PRN
  Administered 2020-01-06: 10:00:00 80 mL via INTRAVENOUS

## 2020-01-16 ENCOUNTER — Other Ambulatory Visit: Payer: Self-pay

## 2020-01-16 ENCOUNTER — Ambulatory Visit (INDEPENDENT_AMBULATORY_CARE_PROVIDER_SITE_OTHER): Payer: Medicare Other | Admitting: *Deleted

## 2020-01-16 DIAGNOSIS — Z5181 Encounter for therapeutic drug level monitoring: Secondary | ICD-10-CM | POA: Diagnosis not present

## 2020-01-16 DIAGNOSIS — I4891 Unspecified atrial fibrillation: Secondary | ICD-10-CM | POA: Diagnosis not present

## 2020-01-16 LAB — POCT INR: INR: 1.8 — AB (ref 2.0–3.0)

## 2020-01-16 NOTE — Patient Instructions (Signed)
Increase warfarin to 1 tablet daily except 1/2 tablet on Tuesdays and Fridays Recheck INR 3 wks

## 2020-01-30 ENCOUNTER — Other Ambulatory Visit: Payer: Self-pay

## 2020-01-30 ENCOUNTER — Encounter (INDEPENDENT_AMBULATORY_CARE_PROVIDER_SITE_OTHER): Payer: Self-pay | Admitting: Internal Medicine

## 2020-01-30 ENCOUNTER — Ambulatory Visit (INDEPENDENT_AMBULATORY_CARE_PROVIDER_SITE_OTHER): Payer: Medicare Other | Admitting: Internal Medicine

## 2020-01-30 DIAGNOSIS — K580 Irritable bowel syndrome with diarrhea: Secondary | ICD-10-CM

## 2020-01-30 DIAGNOSIS — K7469 Other cirrhosis of liver: Secondary | ICD-10-CM | POA: Diagnosis not present

## 2020-01-30 DIAGNOSIS — M1990 Unspecified osteoarthritis, unspecified site: Secondary | ICD-10-CM | POA: Diagnosis not present

## 2020-01-30 DIAGNOSIS — R1031 Right lower quadrant pain: Secondary | ICD-10-CM

## 2020-01-30 DIAGNOSIS — K589 Irritable bowel syndrome without diarrhea: Secondary | ICD-10-CM | POA: Insufficient documentation

## 2020-01-30 DIAGNOSIS — K7460 Unspecified cirrhosis of liver: Secondary | ICD-10-CM | POA: Insufficient documentation

## 2020-01-30 MED ORDER — LOPERAMIDE HCL 2 MG PO TABS: 2.0000 mg | ORAL_TABLET | Freq: Two times a day (BID) | ORAL | 1 refills | Status: DC | PRN

## 2020-01-30 MED ORDER — TRAMADOL HCL 50 MG PO TABS: 25.0000 mg | ORAL_TABLET | Freq: Two times a day (BID) | ORAL | 0 refills | Status: DC | PRN

## 2020-01-30 NOTE — Patient Instructions (Signed)
Use tramadol as discussed; take 25 mg in the middle of the night and second dose during the daytime as needed. Physician will call with results of blood test when completed.

## 2020-01-30 NOTE — Progress Notes (Signed)
Presenting complaint;  Right lower quadrant abdominal pain and new diagnosis of cirrhosis.  Database and subjective:  Patient is 86 year old Caucasian female who is here for scheduled visit accompanied by her husband Dr. Renard Matter. She has history of irritable bowel syndrome with diarrhea as well as history of colonic adenomas.  Last colonoscopy was in May 2018 with removal of 4 mm polyp which was loss.  She had mild sigmoid diverticulosis.  She has been experiencing right lower quadrant abdominal pain for 6 months.  Pain was initially mild and intermittent and now it is constant pain although is more pronounced when she wakes up in the morning and it decreases in severity as the day progresses.  She feels this pain radiates into her back.  She says it is severe pain but she cannot describe this pain with any other features such as sharp or cutting pain.  At times she has noted pain when she puts pressure on on the right leg.  She says pain is worse when she wakes up in the morning.  She thought of waddles to the bathroom when she wakes up.  She has not experienced any changes to this pain with meals or defecation.  She has intermittent diarrhea and urgency.  She takes hyoscyamine sublingual every morning and loperamide 2 mg a day and on most days she has formed stool.  She denies melena or rectal bleeding.  She also complains of pain in sacroiliac joint.  She states she had steroid injection 6 weeks ago. She had abdominal pelvic CT by Dr. Willey Blade on 01/06/2020 which did not reveal any abnormality to account for this pain.  She did have DJD changes in the spine and hips right greater than left.  She also had levoconvex scoliosis of the lumbar spine grade 1 anterolisthesis of L3 on L4 and L4 and L5.  There was irregularity liver contour suggestive of cirrhosis.  She also had hepatic cyst as well as cysts in each kidney. Her husband states her alkaline phosphatase has been mildly elevated but she has  never had elevated transaminases in the past were diagnosed with chronic liver disease.  She has stool out and drinks of wine every evening 1 before and 1 after supper. She has received hepatitis a and B vaccination in the past. She reports that she has been on same dose of warfarin for a long time. She says her macular degeneration is getting worse.  She has not driven in the last 2 years. She needs a refill on her Imodium.  Current Medications: Outpatient Encounter Medications as of 01/30/2020  Medication Sig  . cholecalciferol (VITAMIN D) 1000 UNITS tablet Take 1,000 Units by mouth daily.    . dorzolamide (TRUSOPT) 2 % ophthalmic solution Place 1 drop into both eyes 2 (two) times daily.   . furosemide (LASIX) 20 MG tablet TAKE 3 TABLETS BY MOUTH DAILY EACH MORNING OR AS NEEDED.  . hyoscyamine (LEVSIN SL) 0.125 MG SL tablet DISSOLVE 1 TABLET UNDER THE TONGUE TWICE A DAY AS NEEDED  . hyoscyamine (LEVSIN, ANASPAZ) 0.125 MG tablet Take 1 tablet (0.125 mg total) by mouth 2 (two) times daily as needed.  . latanoprost (XALATAN) 0.005 % ophthalmic solution Place 1 drop into both eyes at bedtime.   Marland Kitchen loperamide (IMODIUM A-D) 2 MG tablet Take 2 mg by mouth as needed for diarrhea or loose stools.  . metoprolol succinate (TOPROL XL) 25 MG 24 hr tablet Take 25 mg daily in addition to 50 mg ,  fora total of 75 mg daily  . ondansetron (ZOFRAN) 8 MG tablet Take 1 tablet (8 mg total) by mouth every 8 (eight) hours as needed for nausea or vomiting.  . potassium chloride SA (K-DUR) 20 MEQ tablet Take 1 tablet (20 mEq total) by mouth daily.  . traMADol (ULTRAM) 50 MG tablet Take 25 mg by mouth daily as needed for moderate pain. Only takes if necessary  . valsartan (DIOVAN) 80 MG tablet Take 1 tablet (80 mg total) by mouth daily.  . vitamin B-12 (CYANOCOBALAMIN) 1000 MCG tablet Take 1,000 mcg by mouth daily.  Marland Kitchen warfarin (COUMADIN) 5 MG tablet Take 1 tablet daily except 1/2 tablet on Mondays or as directed by  Anticoagulation Clinic  . metoprolol succinate (TOPROL-XL) 50 MG 24 hr tablet Take 1 tablet (50 mg total) by mouth daily. Take with or immediately following a meal. (Patient not taking: Reported on 01/30/2020)   No facility-administered encounter medications on file as of 01/30/2020.   Past Medical History:  Diagnosis Date  . Adenocarcinoma, breast ([place])    treated with lumpectomy, node dissection, chemo and RT  . Ataxia   . Breast cancer ([place]) 2009   IDC+DCIS of right breast; triple positive  . Cardiomyopathy 2009   possibly secondary to Adriamycin; EF 40% in 12/09 and 50% 3/10; pulmonary edema in 2009  . Chronic anticoagulation 04/16/2011  . Chronic kidney disease (CKD), stage I    when on diuretic with creatinine of 1.6  . Complication of anesthesia    patient vomits with demerol  . DJD (degenerative joint disease)    right knee and lumbosacral spine  . Hearing impairment   . History of angina 2001   normal cornary arteries in 2001  . Hypertension   . Macular degeneration, dry    + cataracts  . Neuropathy    fingers and toes  . PONV (postoperative nausea and vomiting)   . Retinal macular atrophy   . Sick sinus syndrome ([facility])    Paroxysmal to permanent AF; initially first degree AV block also noted   Past Surgical History:  Procedure Laterality Date  . APPENDECTOMY    . CATARACT EXTRACTION W/PHACO  10/11/2012   Procedure: CATARACT EXTRACTION PHACO AND INTRAOCULAR LENS PLACEMENT (IOC);  Surgeon: Williams Che, MD;  Location: [facility];  Service: Ophthalmology;  Laterality: Right;  CDE:10.82  . COLONOSCOPY  02/11/2012   Colonoscopy plus polypectomy x2, Agam Tuohy  . COLONOSCOPY N/A 03/30/2017   Procedure: COLONOSCOPY;  Surgeon: Rogene Houston, MD;  [facility];  Service: Endoscopy;  Laterality: N/A;  830  . DILATION AND CURETTAGE OF UTERUS    . ESOPHAGEAL DILATION N/A 01/19/2015   Procedure: ESOPHAGEAL DILATION;  Surgeon: Rogene Houston, MD;  [facility];   Service: Endoscopy;  Laterality: N/A;  . ESOPHAGOGASTRODUODENOSCOPY N/A 01/19/2015   Procedure: ESOPHAGOGASTRODUODENOSCOPY (EGD);  Surgeon: Rogene Houston, MD;  [facility];  Service: Endoscopy;  Laterality: N/A;  910  . MASTECTOMY PARTIAL / LUMPECTOMY W/ AXILLARY LYMPHADENECTOMY  2009   Carcinoma of the breast  . MICRODISCECTOMY LUMBAR  2009   lumbosacral spine  . MIDDLE EAR SURGERY     Left  . POLYPECTOMY  03/30/2017   Procedure: POLYPECTOMY;  Surgeon: Rogene Houston, MD;  [facility];  Service: Endoscopy;;  colon  . PORT-A-CATH REMOVAL    . TONSILLECTOMY        Objective: Blood pressure 123/77, pulse 99, temperature (!) 97.1 F (36.2  C), temperature source Temporal, height 4\' 11"  (1.499 m), weight 118 lb 6.4 oz (53.7 kg). Patient is alert and in no acute distress. She is wearing a facial mask. Conjunctiva is pink. Sclera is nonicteric Oropharyngeal mucosa is normal. No neck masses or thyromegaly noted. Cardiac exam with irregular rhythm normal S1 and S2. No murmur or gallop noted. Lungs are clear to auscultation. Abdomen is symmetrical.  Bowel sounds are normal.  On palpation abdomen is soft and nontender with organomegaly or masses. No LE edema or clubbing noted.  Labs/studies Results:  CT images from January 19, 2020 reviewed with patient and her husband Dr. Heide Spark.   Assessment:  #1.  Right lower quadrant abdominal pain of 6 months duration.  This pain is all the features of referred pain from her back.  Unfortunately she cannot take NSAIDs.  Since she is having significant pain it would be reasonable to trial her on low-dose tramadol to improve quality of her life.  She may also have to be evaluated by a neurologist if pain continues.  #2.  New diagnosis of cirrhosis.  She has well compensated hepatic function.  Very reassuring to note that she has remained on more or less same dose of warfarin for years.  Suspect cirrhosis secondary to chemotherapy  that she received for breast carcinoma few years ago.  Need to rule out other common conditions such as primary biliary cholangitis, autoimmune hepatitis as well as chronic hepatitis C.  Very unlikely that she has chronic B as 1 would expect it to flareup with chemotherapy.  #3.  Irritable bowel syndrome with diarrhea and urgency.  She is doing well with low-dose antispasmodic and loperamide.   Plan:  New prescription given for loperamide 2 mg p.o. twice daily 200 doses without refill. Request copy of recent blood work from Dr. Ria Comment office. Tramadol 25 mg in the middle of night when she wakes up to void and she can take second dose during the daytime if necessary.  Prescription given for 50 mg tablets 30 without refill. Patient will go to the lab for sed rate, smooth muscle antibody, ANA, mitochondrial antibody, hepatitis B surface antigen, hepatitis C virus antibody and alpha-fetoprotein. Office visit in 3 months.

## 2020-02-03 ENCOUNTER — Other Ambulatory Visit (INDEPENDENT_AMBULATORY_CARE_PROVIDER_SITE_OTHER): Payer: Self-pay | Admitting: *Deleted

## 2020-02-03 DIAGNOSIS — K7469 Other cirrhosis of liver: Secondary | ICD-10-CM

## 2020-02-06 ENCOUNTER — Ambulatory Visit (INDEPENDENT_AMBULATORY_CARE_PROVIDER_SITE_OTHER): Payer: Medicare Other | Admitting: *Deleted

## 2020-02-06 ENCOUNTER — Other Ambulatory Visit: Payer: Self-pay | Admitting: Cardiology

## 2020-02-06 ENCOUNTER — Other Ambulatory Visit: Payer: Self-pay

## 2020-02-06 DIAGNOSIS — Z5181 Encounter for therapeutic drug level monitoring: Secondary | ICD-10-CM | POA: Diagnosis not present

## 2020-02-06 DIAGNOSIS — I4891 Unspecified atrial fibrillation: Secondary | ICD-10-CM | POA: Diagnosis not present

## 2020-02-06 LAB — AFP TUMOR MARKER: AFP-Tumor Marker: 16.3 ng/mL — ABNORMAL HIGH

## 2020-02-06 LAB — POCT INR: INR: 3 (ref 2.0–3.0)

## 2020-02-06 LAB — HEPATITIS C ANTIBODY
Hepatitis C Ab: NONREACTIVE
SIGNAL TO CUT-OFF: 0.17 (ref <1.00)

## 2020-02-06 LAB — HEPATITIS B SURFACE ANTIGEN: Hepatitis B Surface Ag: NONREACTIVE

## 2020-02-06 LAB — ANA: Anti Nuclear Antibody (ANA): NEGATIVE

## 2020-02-06 LAB — MITOCHONDRIAL ANTIBODIES: Mitochondrial M2 Ab, IgG: <=20 U

## 2020-02-06 LAB — SEDIMENTATION RATE: Sed Rate: 31 mm/h — ABNORMAL HIGH (ref 0–30)

## 2020-02-06 LAB — ANTI-SMOOTH MUSCLE ANTIBODY, IGG: Actin (Smooth Muscle) Antibody (IGG): <20 U (ref <20)

## 2020-02-06 MED ORDER — FUROSEMIDE 20 MG PO TABS: ORAL_TABLET | ORAL | 4 refills | Status: DC

## 2020-02-06 NOTE — Patient Instructions (Signed)
Continue warfarin 1 tablet daily except 1/2 tablet on Tuesdays and Fridays Recheck INR 4 wks

## 2020-02-08 ENCOUNTER — Telehealth: Payer: Self-pay | Admitting: Cardiology

## 2020-02-08 ENCOUNTER — Encounter: Payer: Self-pay | Admitting: Cardiology

## 2020-02-08 ENCOUNTER — Other Ambulatory Visit: Payer: Self-pay

## 2020-02-08 ENCOUNTER — Ambulatory Visit (INDEPENDENT_AMBULATORY_CARE_PROVIDER_SITE_OTHER): Payer: Medicare Other | Admitting: Cardiology

## 2020-02-08 VITALS — BP 116/80 | HR 48 | Ht 59.0 in | Wt 122.6 lb

## 2020-02-08 DIAGNOSIS — I5022 Chronic systolic (congestive) heart failure: Secondary | ICD-10-CM | POA: Diagnosis not present

## 2020-02-08 DIAGNOSIS — I10 Essential (primary) hypertension: Secondary | ICD-10-CM

## 2020-02-08 DIAGNOSIS — I4891 Unspecified atrial fibrillation: Secondary | ICD-10-CM

## 2020-02-08 DIAGNOSIS — I340 Nonrheumatic mitral (valve) insufficiency: Secondary | ICD-10-CM | POA: Diagnosis not present

## 2020-02-08 MED ORDER — FUROSEMIDE 20 MG PO TABS: ORAL_TABLET | ORAL | 3 refills | Status: DC

## 2020-02-08 NOTE — Patient Instructions (Signed)
Your physician wants you to follow-up in: [place] will receive a reminder letter in the mail two months in advance. If you don't receive a letter, please call our office to schedule the follow-up appointment.  Your physician has recommended you make the following change in your medication:   TAKE LASIX 60 MG DAILY - MAY TAKE ADDITIONAL 20 MG AS NEEDED FOR WEIGHT GAIN OR SWELLING  Your physician has requested that you have an echocardiogram. Echocardiography is a painless test that uses sound waves to create images of your heart. It provides your doctor with information about the size and shape of your heart and how well your heart's chambers and valves are working. This procedure takes approximately one hour. There are no restrictions for this procedure.  Thank you for choosing [facility]!!

## 2020-02-08 NOTE — Telephone Encounter (Signed)
  Precert needed for:  Echo  Location: [facility]     Date: February 14, 2020

## 2020-02-08 NOTE — Progress Notes (Signed)
Clinical Summary Alicia Harrington is a 86 y.o.female seen today for follow up of the following medical problems.   1. Chronic systolic heart failure - possibly chemo induced, secondary to Adriamycin.  - echo 01/2016 LVEF 40-45%, mild to moderate AI, mild to moderate MR, PASP 42 - previous radiation treatments, previous CXRs have shown some scarring and COPD changes however had normal PFTs     09/2018 echo LVEF 35-40%, diffuse hypokinesis, moderate MR, mild AI - issues with entresto and cost through there insurance , back on ARB.   - some chronic SOB, somewhat increased.  - has been sedentary recently - some LE edema at times.  - basline weight 116, up to 120 lbs at home recently    2.Valvular heart disease - mild AI, mod MR by last echo    2. HTN   - compliant withmeds  3. Paroxysmal afib  -no significant symptoms - has not been interested in NOACs, remains on coumadin    4. Breast CA - followed by oncology   5. Chronic pneumonitis - related to prior cancer treatments.      Travels regularly to [place] and [place]    Past Medical History:  Diagnosis Date  . Adenocarcinoma, breast ([place])    treated with lumpectomy, node dissection, chemo and RT  . Ataxia   . Breast cancer ([place]) 2009   IDC+DCIS of right breast; triple positive  . Cardiomyopathy 2009   possibly secondary to Adriamycin; EF 40% in 12/09 and 50% 3/10; pulmonary edema in 2009  . Chronic anticoagulation 04/16/2011  . Chronic kidney disease (CKD), stage I    when on diuretic with creatinine of 1.6  . Complication of anesthesia    patient vomits with demerol  . DJD (degenerative joint disease)    right knee and lumbosacral spine  . Hearing impairment   . History of angina 2001   normal cornary arteries in 2001  . Hypertension   . Macular degeneration, dry    + cataracts  . Neuropathy    fingers and toes  . PONV (postoperative nausea and vomiting)   .  Retinal macular atrophy   . Sick sinus syndrome ([facility])    Paroxysmal to permanent AF; initially first degree AV block also noted     Allergies  Allergen Reactions  . Altace [Ramipril] Rash     Current Outpatient Medications  Medication Sig Dispense Refill  . cholecalciferol (VITAMIN D) 1000 UNITS tablet Take 1,000 Units by mouth daily.      . dorzolamide (TRUSOPT) 2 % ophthalmic solution Place 1 drop into both eyes 2 (two) times daily.     . furosemide (LASIX) 20 MG tablet TAKE 3 TABLETS BY MOUTH DAILY EACH MORNING OR AS NEEDED. 90 tablet 0  . furosemide (LASIX) 20 MG tablet TAKE 3 TABLETS BY MOUTH DAILY EACH MORNING OR AS NEEDED. 90 tablet 4  . hyoscyamine (LEVSIN SL) 0.125 MG SL tablet DISSOLVE 1 TABLET UNDER THE TONGUE TWICE A DAY AS NEEDED 180 tablet 0  . latanoprost (XALATAN) 0.005 % ophthalmic solution Place 1 drop into both eyes at bedtime.   3  . loperamide (IMODIUM A-D) 2 MG tablet Take 1 tablet (2 mg total) by mouth 2 (two) times daily as needed for diarrhea or loose stools. 200 tablet 1  . metoprolol succinate (TOPROL XL) 25 MG 24 hr tablet Take 25 mg daily in addition to 50 mg , fora total of 75 mg daily 90 tablet 3  .  metoprolol succinate (TOPROL-XL) 50 MG 24 hr tablet Take 1 tablet (50 mg total) by mouth daily. Take with or immediately following a meal. (Patient not taking: Reported on 01/30/2020) 90 tablet 3  . ondansetron (ZOFRAN) 8 MG tablet Take 1 tablet (8 mg total) by mouth every 8 (eight) hours as needed for nausea or vomiting. 30 tablet 1  . potassium chloride SA (K-DUR) 20 MEQ tablet Take 1 tablet (20 mEq total) by mouth daily. 90 tablet 3  . traMADol (ULTRAM) 50 MG tablet Take 0.5 tablets (25 mg total) by mouth 2 (two) times daily as needed for moderate pain. Only takes if necessary 30 tablet 0  . valsartan (DIOVAN) 80 MG tablet Take 1 tablet (80 mg total) by mouth daily. 90 tablet 3  . vitamin B-12 (CYANOCOBALAMIN) 1000 MCG tablet Take 1,000 mcg by mouth daily.    Marland Kitchen  warfarin (COUMADIN) 5 MG tablet Take 1 tablet daily except 1/2 tablet on Mondays or as directed by Anticoagulation Clinic 90 tablet 3   No current facility-administered medications for this visit.     Past Surgical History:  Procedure Laterality Date  . APPENDECTOMY    . CATARACT EXTRACTION W/PHACO  10/11/2012   Procedure: CATARACT EXTRACTION PHACO AND INTRAOCULAR LENS PLACEMENT (IOC);  Surgeon: Williams Che, MD;  Location: [facility];  Service: Ophthalmology;  Laterality: Right;  CDE:10.82  . COLONOSCOPY  02/11/2012   Colonoscopy plus polypectomy x2, Rehman  . COLONOSCOPY N/A 03/30/2017   Procedure: COLONOSCOPY;  Surgeon: Rogene Houston, MD;  Location: [facility];  Service: Endoscopy;  Laterality: N/A;  830  . DILATION AND CURETTAGE OF UTERUS    . ESOPHAGEAL DILATION N/A 01/19/2015   Procedure: ESOPHAGEAL DILATION;  Surgeon: Rogene Houston, MD;  Location: [facility];  Service: Endoscopy;  Laterality: N/A;  . ESOPHAGOGASTRODUODENOSCOPY N/A 01/19/2015   Procedure: ESOPHAGOGASTRODUODENOSCOPY (EGD);  Surgeon: Rogene Houston, MD;  Location: [facility];  Service: Endoscopy;  Laterality: N/A;  910  . MASTECTOMY PARTIAL / LUMPECTOMY W/ AXILLARY LYMPHADENECTOMY  2009   Carcinoma of the breast  . MICRODISCECTOMY LUMBAR  2009   lumbosacral spine  . MIDDLE EAR SURGERY     Left  . POLYPECTOMY  03/30/2017   Procedure: POLYPECTOMY;  Surgeon: Rogene Houston, MD;  Location: [facility];  Service: Endoscopy;;  colon  . PORT-A-CATH REMOVAL    . TONSILLECTOMY       Allergies  Allergen Reactions  . Altace [Ramipril] Rash      Family History  Problem Relation Age of Onset  . Heart failure Mother   . Bladder Cancer Maternal Uncle        smoker  . Ovarian cancer Maternal Grandmother        dx. 32s; +surgery  . Stroke Brother   . Breast cancer Maternal Aunt        dx. late 69s  . Breast cancer Cousin 3  . Prostate cancer Cousin 66  . Cancer Cousin        unspecified type  .  Stroke Paternal Uncle      Social History Ms. Delatour reports that she quit smoking about 64 years ago. Her smoking use included cigarettes. She started smoking about 65 years ago. She has never used smokeless tobacco. Ms. Markey reports current alcohol use.   Review of Systems CONSTITUTIONAL: No weight loss, fever, chills, weakness or fatigue.  HEENT: Eyes: No visual loss, blurred vision, double vision or yellow sclerae.No hearing loss, sneezing,  congestion, runny nose or sore throat.  SKIN: No rash or itching.  CARDIOVASCULAR: per hpi RESPIRATORY: No shortness of breath, cough or sputum.  GASTROINTESTINAL: No anorexia, nausea, vomiting or diarrhea. No abdominal pain or blood.  GENITOURINARY: No burning on urination, no polyuria NEUROLOGICAL: No headache, dizziness, syncope, paralysis, ataxia, numbness or tingling in the extremities. No change in bowel or bladder control.  MUSCULOSKELETAL: No muscle, back pain, joint pain or stiffness.  LYMPHATICS: No enlarged nodes. No history of splenectomy.  PSYCHIATRIC: No history of depression or anxiety.  ENDOCRINOLOGIC: No reports of sweating, cold or heat intolerance. No polyuria or polydipsia.  Marland Kitchen   Physical Examination Today's Vitals   02/08/20 1440  BP: 116/80  Pulse: (!) 48  SpO2: 96%  Weight: 122 lb 9.6 oz (55.6 kg)  Height: 4\' 11"  (1.499 m)   Body mass index is 24.76 kg/m.  Gen: resting comfortably, no acute distress HEENT: no scleral icterus, pupils equal round and reactive, no palptable cervical adenopathy,  CV: irreg, 2/6 systoilc murmur rusb, mildly elevated JVD Resp: Clear to auscultation bilaterally GI: abdomen is soft, non-tender, non-distended, normal bowel sounds, no hepatosplenomegaly MSK: extremities are warm, trace bilateral edema Skin: warm, no rash Neuro:  no focal deficits Psych: appropriate affect   Diagnostic Studies 12/2013 Echo LVEF 45-50%, elevated LA pressure, mild AI, moderate MR, mod TR, PASP  46  10/2014 Echo Study Conclusions  - Left ventricle: The cavity size was normal. Wall thickness was normal. Systolic function was mildly to moderately reduced. The estimated ejection fraction was in the range of 40% to 45%. - Aortic valve: There was mild regurgitation. Regurgitation pressure half-time: 794 ms. - Mitral valve: Mildly calcified annulus. There was mild to moderate regurgitation. MR vena contracta is 0.4 cm. - Left atrium: The atrium was severely dilated. - Right ventricle: The interventricular septum is flattened in systole and diastole, consistent with RV pressure and volume overload. The cavity size was mildly dilated. Systolic function was low normal. RV TAPSE is 1.6 cm. - Right atrium: The atrium was severely dilated. - Atrial septum: No defect or patent foramen ovale was identified. - Pulmonary arteries: Systolic pressure was moderately increased. PA peak pressure: 60 mm Hg (S). - Technically adequate study.   01/2016 echo Study Conclusions  - Left ventricle: The cavity size was normal. Wall thickness was  normal. Systolic function was mildly to moderately reduced. The  estimated ejection fraction was in the range of 40% to 45%.  Diffuse hypokinesis. - Aortic valve: Mildly calcified annulus. Trileaflet; mildly  thickened leaflets. There was mild to moderate regurgitation.  Valve area (VTI): 2.35 cm^2. Valve area (Vmax): 2.4 cm^2. Valve  area (Vmean): 2.6 cm^2. Regurgitation pressure half-time: 690 ms. - Mitral valve: Mildly calcified annulus. Mildly thickened leaflets  . There was mild to moderate regurgitation. - Left atrium: The atrium was severely dilated. - Right atrium: The atrium was severely dilated. - Pulmonary arteries: Systolic pressure was moderately increased.  PA peak pressure: 42 mm Hg (S). - Technically adequate study.     Assessment and Plan  1. Chronic systolic HF - mild weight gain and some recent  symptoms, she will take lasix 60mg  daily but take additional 20mg  as needed.    2. Mitral regurgitation - repeat echo   3. HTN  - she is at goal, continue current meds  4. Paroxysmal afib  - not interested in NOACs, continue coumadin.  - no significant symptoms. Low HR by dynamap but EKG shows afib in the  80s   F/u 6 months  Arnoldo Lenis, M.D.

## 2020-02-13 ENCOUNTER — Other Ambulatory Visit (INDEPENDENT_AMBULATORY_CARE_PROVIDER_SITE_OTHER): Payer: Self-pay | Admitting: *Deleted

## 2020-02-13 DIAGNOSIS — K7469 Other cirrhosis of liver: Secondary | ICD-10-CM

## 2020-02-14 ENCOUNTER — Other Ambulatory Visit: Payer: Self-pay

## 2020-02-14 ENCOUNTER — Ambulatory Visit (HOSPITAL_COMMUNITY)
Admission: RE | Admit: 2020-02-14 | Discharge: 2020-02-14 | Disposition: A | Discharge status: Home / Self Care | Payer: Medicare Other | Source: Ambulatory Visit | Attending: Cardiology | Admitting: Cardiology

## 2020-02-14 DIAGNOSIS — I340 Nonrheumatic mitral (valve) insufficiency: Secondary | ICD-10-CM | POA: Insufficient documentation

## 2020-02-14 NOTE — Progress Notes (Signed)
*  PRELIMINARY RESULTS* Echocardiogram 2D Echocardiogram has been performed.  Leavy Cella 02/14/2020, 10:16 AM

## 2020-02-20 ENCOUNTER — Telehealth: Payer: Self-pay

## 2020-02-20 NOTE — Telephone Encounter (Signed)
Pt made aware. She questions the report about her "severly enlarged atrium." She asks if that should concern her  And if there is anything that needs to be done. Please advise.

## 2020-02-20 NOTE — Telephone Encounter (Signed)
-----   Message from Arnoldo Lenis, MD sent at 02/17/2020  3:04 PM EDT ----- Echo shows heart pumping function remains stable around 40%. Mitral valve leak is stable in the moderate range. We will continue to monitor   Zandra Abts MD

## 2020-02-21 NOTE — Telephone Encounter (Signed)
The left atrium has been severely enlarged since 2015. Its a result of her heart failure, which causes higher pressures in the heart and overtime enlargement of the left atrium. The treatment is essentially medical therapy for the heart failure that she is currently on.   Zandra Abts MD

## 2020-02-23 NOTE — Telephone Encounter (Signed)
Sent My-Chart message

## 2020-03-02 DIAGNOSIS — K7469 Other cirrhosis of liver: Secondary | ICD-10-CM | POA: Diagnosis not present

## 2020-03-05 ENCOUNTER — Ambulatory Visit (INDEPENDENT_AMBULATORY_CARE_PROVIDER_SITE_OTHER): Payer: Medicare Other | Admitting: *Deleted

## 2020-03-05 DIAGNOSIS — Z5181 Encounter for therapeutic drug level monitoring: Secondary | ICD-10-CM

## 2020-03-05 DIAGNOSIS — I4891 Unspecified atrial fibrillation: Secondary | ICD-10-CM | POA: Diagnosis not present

## 2020-03-05 LAB — POCT INR: INR: 3.3 — AB (ref 2.0–3.0)

## 2020-03-05 NOTE — Patient Instructions (Signed)
Decrease warfarin to 1 tablet daily except 1/2 tablet on Mondays, Wednesdays and Fridays Recheck INR 3 wks

## 2020-03-06 LAB — AFP TUMOR MARKER: AFP-Tumor Marker: 15.9 ng/mL — ABNORMAL HIGH

## 2020-03-08 ENCOUNTER — Other Ambulatory Visit: Payer: Self-pay | Admitting: *Deleted

## 2020-03-08 MED ORDER — METOPROLOL SUCCINATE ER 50 MG PO TB24: 50.0000 mg | ORAL_TABLET | Freq: Every day | ORAL | 1 refills | Status: DC

## 2020-03-08 MED ORDER — METOPROLOL SUCCINATE ER 25 MG PO TB24: ORAL_TABLET | ORAL | 1 refills | Status: DC

## 2020-03-09 ENCOUNTER — Telehealth: Payer: Self-pay

## 2020-03-09 ENCOUNTER — Other Ambulatory Visit (INDEPENDENT_AMBULATORY_CARE_PROVIDER_SITE_OTHER): Payer: Self-pay | Admitting: *Deleted

## 2020-03-09 DIAGNOSIS — K7469 Other cirrhosis of liver: Secondary | ICD-10-CM

## 2020-03-09 MED ORDER — METOPROLOL SUCCINATE ER 25 MG PO TB24: ORAL_TABLET | ORAL | 1 refills | Status: DC

## 2020-03-09 NOTE — Telephone Encounter (Signed)
refilled toprol xl 25 mg to [facility]

## 2020-03-14 DIAGNOSIS — H04123 Dry eye syndrome of bilateral lacrimal glands: Secondary | ICD-10-CM | POA: Diagnosis not present

## 2020-03-14 DIAGNOSIS — H353130 Nonexudative age-related macular degeneration, bilateral, stage unspecified: Secondary | ICD-10-CM | POA: Diagnosis not present

## 2020-03-14 DIAGNOSIS — H401133 Primary open-angle glaucoma, bilateral, severe stage: Secondary | ICD-10-CM | POA: Diagnosis not present

## 2020-03-14 DIAGNOSIS — Z961 Presence of intraocular lens: Secondary | ICD-10-CM | POA: Diagnosis not present

## 2020-03-14 DIAGNOSIS — H0014 Chalazion left upper eyelid: Secondary | ICD-10-CM | POA: Diagnosis not present

## 2020-03-20 ENCOUNTER — Other Ambulatory Visit (INDEPENDENT_AMBULATORY_CARE_PROVIDER_SITE_OTHER): Payer: Self-pay | Admitting: *Deleted

## 2020-03-20 DIAGNOSIS — K7469 Other cirrhosis of liver: Secondary | ICD-10-CM

## 2020-03-26 ENCOUNTER — Ambulatory Visit (INDEPENDENT_AMBULATORY_CARE_PROVIDER_SITE_OTHER): Payer: Medicare Other | Admitting: *Deleted

## 2020-03-26 ENCOUNTER — Other Ambulatory Visit: Payer: Self-pay

## 2020-03-26 DIAGNOSIS — Z5181 Encounter for therapeutic drug level monitoring: Secondary | ICD-10-CM

## 2020-03-26 DIAGNOSIS — I4891 Unspecified atrial fibrillation: Secondary | ICD-10-CM

## 2020-03-26 LAB — POCT INR: INR: 2.2 (ref 2.0–3.0)

## 2020-03-26 NOTE — Patient Instructions (Signed)
Continue warfarin 1 tablet daily except 1/2 tablet on Mondays, Wednesdays and Fridays Recheck INR 4 wks

## 2020-04-03 DIAGNOSIS — H0014 Chalazion left upper eyelid: Secondary | ICD-10-CM | POA: Diagnosis not present

## 2020-04-03 DIAGNOSIS — Z961 Presence of intraocular lens: Secondary | ICD-10-CM | POA: Diagnosis not present

## 2020-04-03 DIAGNOSIS — H401133 Primary open-angle glaucoma, bilateral, severe stage: Secondary | ICD-10-CM | POA: Diagnosis not present

## 2020-04-03 DIAGNOSIS — H04123 Dry eye syndrome of bilateral lacrimal glands: Secondary | ICD-10-CM | POA: Diagnosis not present

## 2020-04-03 DIAGNOSIS — H353130 Nonexudative age-related macular degeneration, bilateral, stage unspecified: Secondary | ICD-10-CM | POA: Diagnosis not present

## 2020-04-04 ENCOUNTER — Other Ambulatory Visit (INDEPENDENT_AMBULATORY_CARE_PROVIDER_SITE_OTHER): Payer: Self-pay | Admitting: Internal Medicine

## 2020-04-04 DIAGNOSIS — M1990 Unspecified osteoarthritis, unspecified site: Secondary | ICD-10-CM

## 2020-04-06 DIAGNOSIS — R1031 Right lower quadrant pain: Secondary | ICD-10-CM | POA: Diagnosis not present

## 2020-04-06 DIAGNOSIS — M461 Sacroiliitis, not elsewhere classified: Secondary | ICD-10-CM | POA: Diagnosis not present

## 2020-04-11 DIAGNOSIS — M25551 Pain in right hip: Secondary | ICD-10-CM | POA: Diagnosis not present

## 2020-04-16 ENCOUNTER — Ambulatory Visit (INDEPENDENT_AMBULATORY_CARE_PROVIDER_SITE_OTHER): Payer: Medicare Other | Admitting: *Deleted

## 2020-04-16 ENCOUNTER — Other Ambulatory Visit: Payer: Self-pay

## 2020-04-16 DIAGNOSIS — Z5181 Encounter for therapeutic drug level monitoring: Secondary | ICD-10-CM

## 2020-04-16 DIAGNOSIS — I4891 Unspecified atrial fibrillation: Secondary | ICD-10-CM

## 2020-04-16 DIAGNOSIS — M25551 Pain in right hip: Secondary | ICD-10-CM | POA: Diagnosis not present

## 2020-04-16 LAB — POCT INR: INR: 2.4 (ref 2.0–3.0)

## 2020-04-16 NOTE — Patient Instructions (Signed)
Continue warfarin 1 tablet daily except 1/2 tablet on Mondays, Wednesdays and Fridays Recheck INR 4 wks Leaving for [employer] 5/25.  Have INR checked there the week of 05/14/20 Lab order given

## 2020-04-17 DIAGNOSIS — Z853 Personal history of malignant neoplasm of breast: Secondary | ICD-10-CM | POA: Diagnosis not present

## 2020-04-17 DIAGNOSIS — K7469 Other cirrhosis of liver: Secondary | ICD-10-CM | POA: Diagnosis not present

## 2020-04-17 DIAGNOSIS — N6489 Other specified disorders of breast: Secondary | ICD-10-CM | POA: Diagnosis not present

## 2020-04-17 DIAGNOSIS — R922 Inconclusive mammogram: Secondary | ICD-10-CM | POA: Diagnosis not present

## 2020-04-18 LAB — AFP TUMOR MARKER: AFP-Tumor Marker: 14.9 ng/mL — ABNORMAL HIGH

## 2020-04-24 ENCOUNTER — Other Ambulatory Visit (INDEPENDENT_AMBULATORY_CARE_PROVIDER_SITE_OTHER): Payer: Self-pay | Admitting: *Deleted

## 2020-04-24 DIAGNOSIS — K7469 Other cirrhosis of liver: Secondary | ICD-10-CM

## 2020-05-01 ENCOUNTER — Ambulatory Visit (INDEPENDENT_AMBULATORY_CARE_PROVIDER_SITE_OTHER): Payer: Medicare Other | Admitting: Internal Medicine

## 2020-05-14 ENCOUNTER — Other Ambulatory Visit: Payer: Self-pay | Admitting: *Deleted

## 2020-05-14 ENCOUNTER — Telehealth (INDEPENDENT_AMBULATORY_CARE_PROVIDER_SITE_OTHER): Payer: Self-pay | Admitting: *Deleted

## 2020-05-14 NOTE — Telephone Encounter (Signed)
Warfarin Refill 5mg  tablet sent to [facility] [place], [place] per pt request.  #30 tablets x 1 refill

## 2020-05-14 NOTE — Telephone Encounter (Signed)
Out of town and Ms Mcfall needed her Levsin 0.125 mg Sl fill .Marland Kitchen Addressed with Dr.Rehmanand he authorized it to be done.  Hyoscyamine (Levsin SL) 0.125 mg - dissolve 1 under tongue twice day forabdominal spasms. #180 , no refills.  This was called to [facility] [place] in [place] at  [employer] at [phone].  Dr.Lichty was called and made aware.

## 2020-05-16 ENCOUNTER — Ambulatory Visit (INDEPENDENT_AMBULATORY_CARE_PROVIDER_SITE_OTHER): Payer: Medicare Other | Admitting: *Deleted

## 2020-05-16 DIAGNOSIS — I4891 Unspecified atrial fibrillation: Secondary | ICD-10-CM | POA: Diagnosis not present

## 2020-05-16 DIAGNOSIS — Z5181 Encounter for therapeutic drug level monitoring: Secondary | ICD-10-CM

## 2020-05-16 LAB — PROTIME-INR: INR: 2 — AB (ref 0.9–1.1)

## 2020-05-16 NOTE — Patient Instructions (Signed)
Continue warfarin 1 tablet daily except 1/2 tablet on Mondays, Wednesdays and Fridays Recheck INR 4 wks Leaving  [facility] 7/9.  Will call for INR appt when they get back home

## 2020-05-22 ENCOUNTER — Other Ambulatory Visit: Payer: Self-pay

## 2020-05-22 NOTE — Telephone Encounter (Signed)
Refilled lasix to pharmacy in [place].

## 2020-06-11 ENCOUNTER — Ambulatory Visit (INDEPENDENT_AMBULATORY_CARE_PROVIDER_SITE_OTHER): Payer: Medicare Other | Admitting: *Deleted

## 2020-06-11 DIAGNOSIS — I4891 Unspecified atrial fibrillation: Secondary | ICD-10-CM | POA: Diagnosis not present

## 2020-06-11 DIAGNOSIS — Z5181 Encounter for therapeutic drug level monitoring: Secondary | ICD-10-CM

## 2020-06-11 LAB — POCT INR: INR: 1.8 — AB (ref 2.0–3.0)

## 2020-06-11 NOTE — Patient Instructions (Signed)
Increase warfarin to 1 tablet daily except 1/2 tablet on Wednesdays and Saturdays Recheck INR 3 wks

## 2020-07-01 IMAGING — CT CT ABD-PELV W/ CM
3 of 5 series · 16 of 46 positions shown, 18 images · IV contrast (Omnipaque or Isovue)
Comparison: 10/18/2018.

CLINICAL DATA: Right lower quadrant pain and bloating for 4 months.

EXAM:
CT ABDOMEN AND PELVIS WITH CONTRAST
TECHNIQUE: Multidetector CT imaging of the abdomen and pelvis was performed
using the standard protocol following bolus administration of
intravenous contrast.
CONTRAST:  80mL OMNIPAQUE IOHEXOL 300 MG/ML  SOLN

[Series 3: axial st · axial · 0.76mm/px · z∈[+1010,+1325]mm · 10 of 79 slices shown, 12 images]
[im 8/79  soft-tissue]
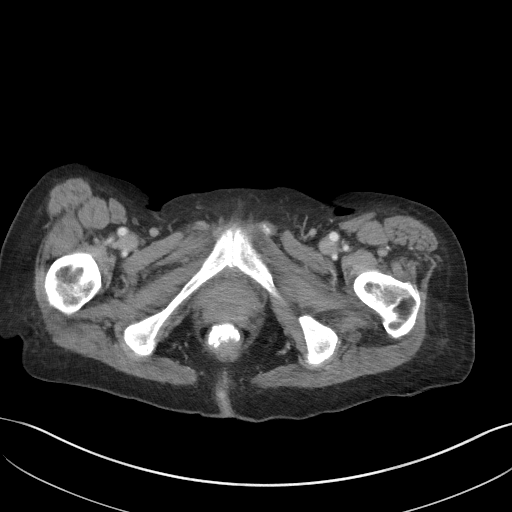
[im 8/79  bone]
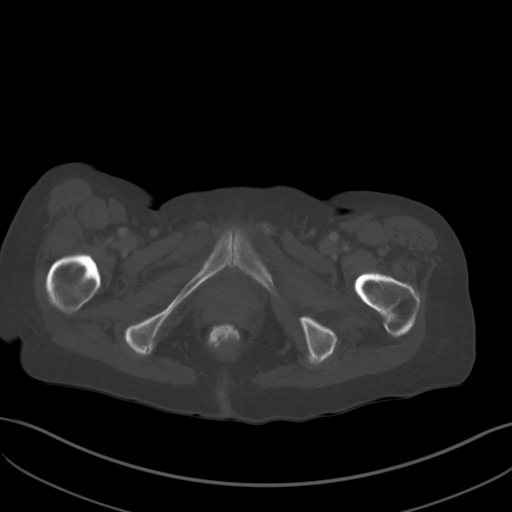
[im 15/79  soft-tissue]
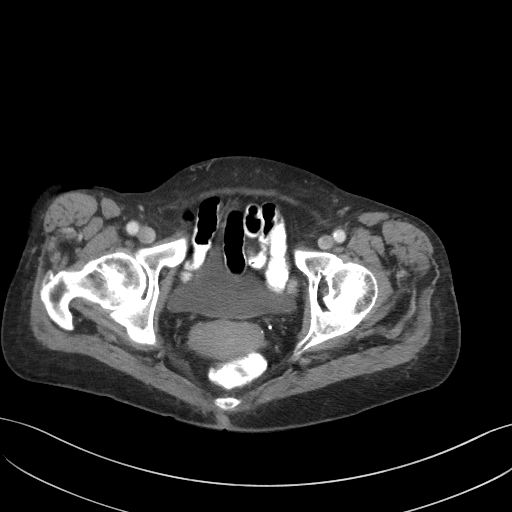
[im 22/79  soft-tissue]
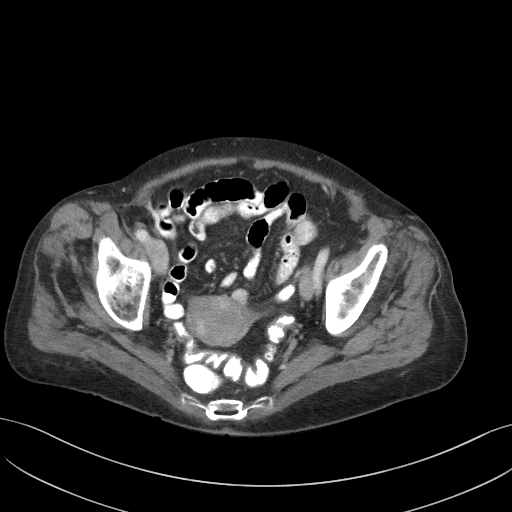
[im 29/79  soft-tissue]
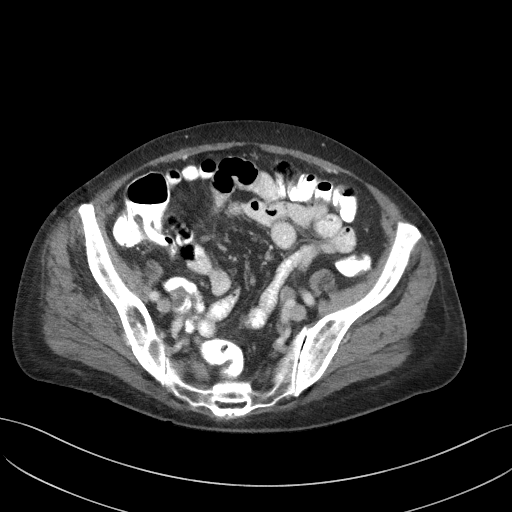
[im 36/79  soft-tissue]
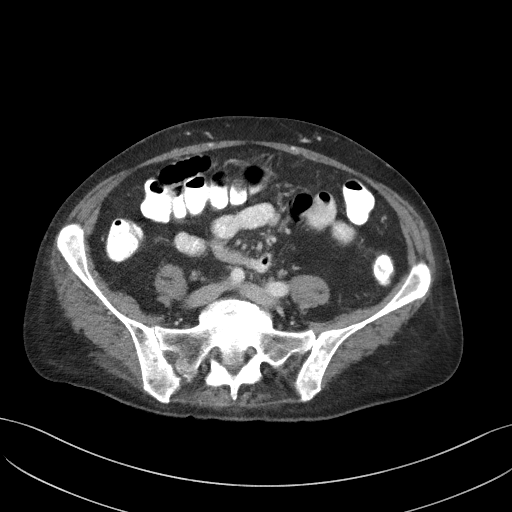
[im 43/79  soft-tissue]
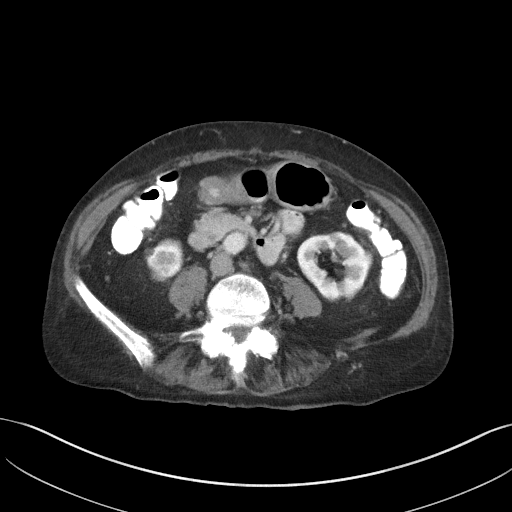
[im 50/79  soft-tissue]
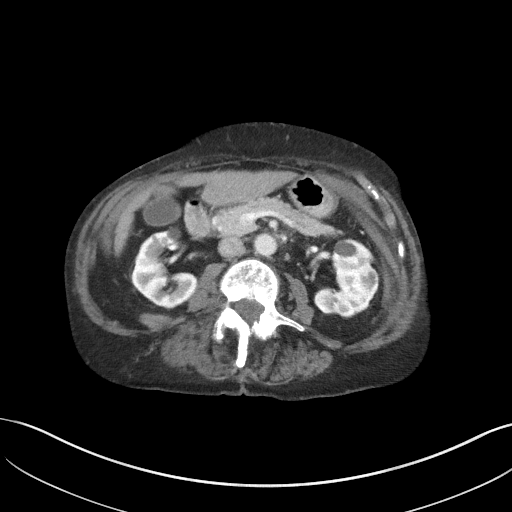
[im 57/79  soft-tissue]
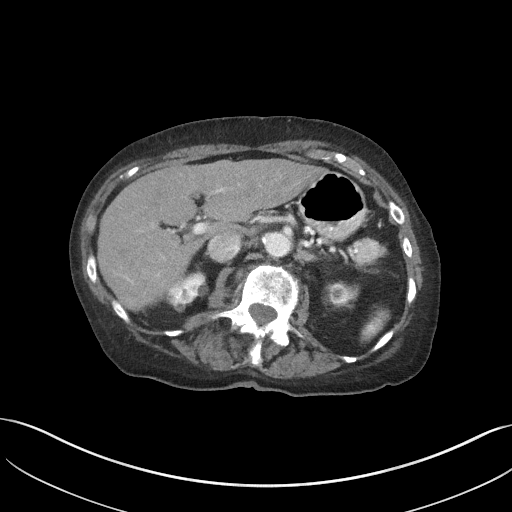
[im 64/79  soft-tissue]
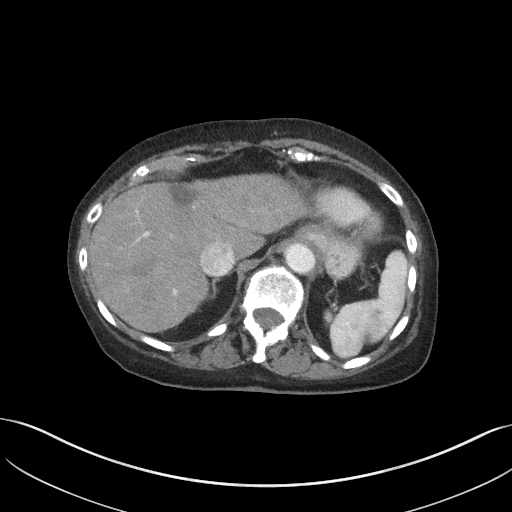
[im 64/79  bone]
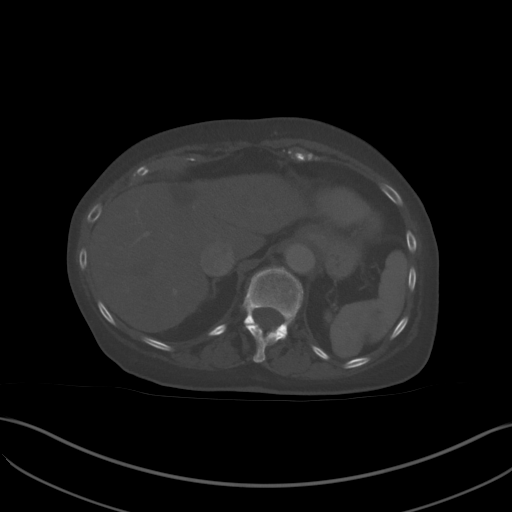
[im 71/79  soft-tissue]
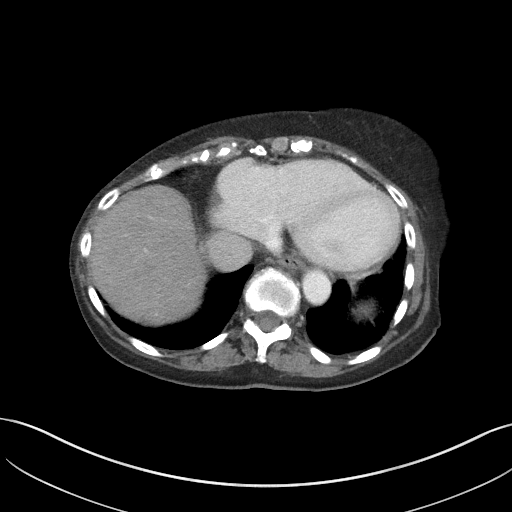

[Series 6: lung bases · axial · 0.76mm/px · z∈[+1191,+1233]mm · 3 of 95 slices shown]
[im 8/95  bone]
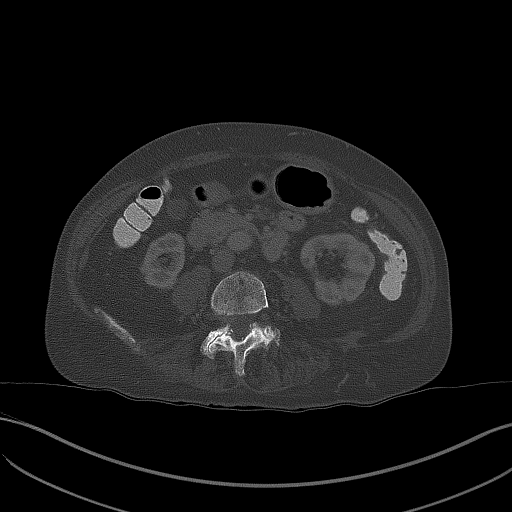
[im 22/95  bone]
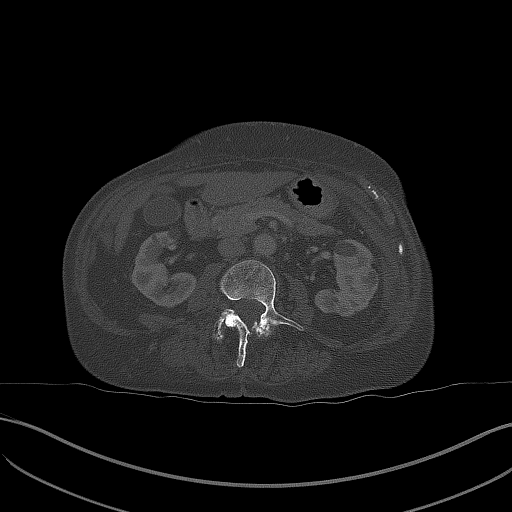
[im 29/95  bone]
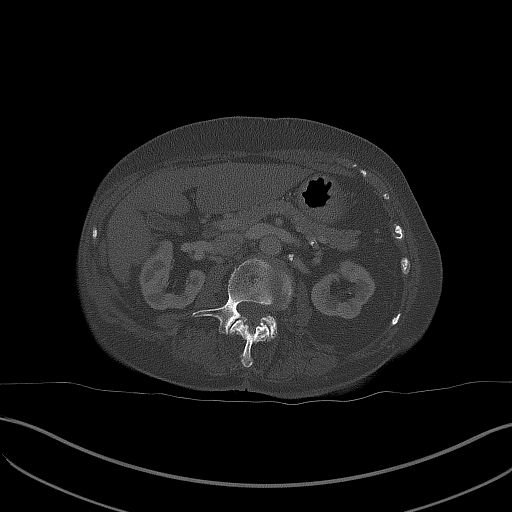

[Series 7: coronal st · coronal · 0.72mm/px · 3 of 90 slices shown]
[im 30/90  soft-tissue]
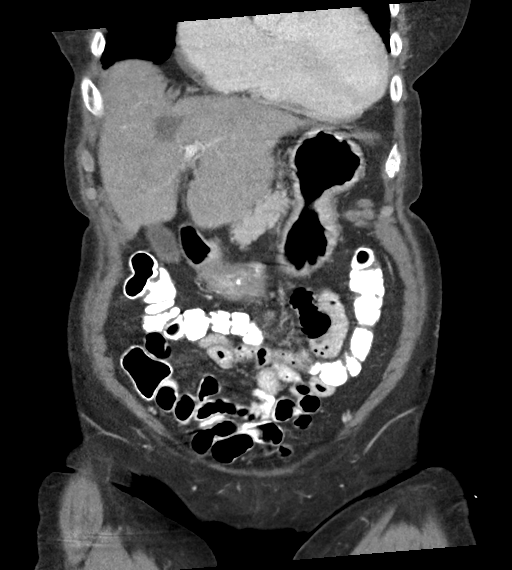
[im 40/90  soft-tissue]
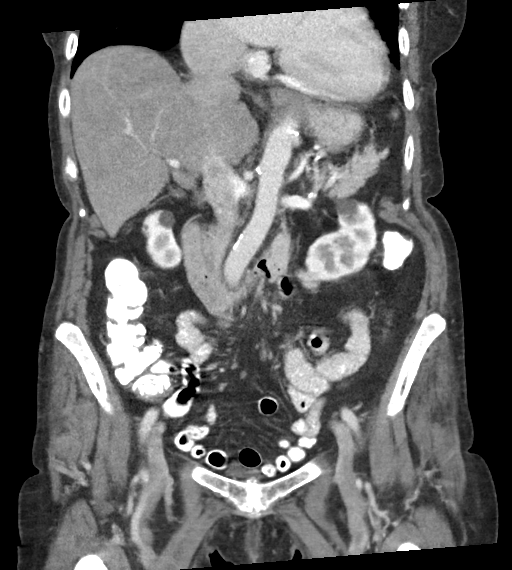
[im 50/90  soft-tissue]
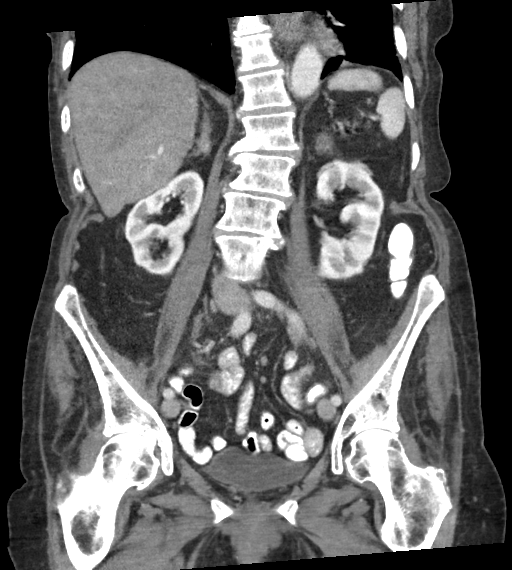

[16 of 46 positions shown; findings below may reference images not displayed]

FINDINGS: Lower chest: Lung bases are clear. Heart is enlarged. No pericardial
or pleural effusion. Distal esophagus is grossly unremarkable.

Hepatobiliary: Liver margin is irregular. Low-attenuation lesion in
the left hepatic lobe measures 1.9 cm ([DATE]), unchanged from
10/18/2018 and likely a cyst. Subcentimeter low-attenuation lesion
in the inferior right hepatic lobe ([DATE]), too small to
characterize. Liver and gallbladder are otherwise unremarkable. No
biliary ductal dilatation.

Pancreas: Negative.

Spleen: Negative.

Adrenals/Urinary Tract: Right adrenal gland is unremarkable. 1.6 cm
left adrenal nodule is unchanged, with a relative washout of 19%.
Low-attenuation lesions in the kidneys measure up to 1.5 cm on the
left and are likely cysts. Renal cortical thinning and scarring
bilaterally. Tiny left renal stone. Ureters are decompressed.
Bladder is grossly unremarkable.

Stomach/Bowel: Stomach, small bowel, appendix and colon are
unremarkable.

Vascular/Lymphatic: Atherosclerotic calcification of the aorta
without aneurysm. No pathologically enlarged lymph nodes.

Reproductive: Uterus and ovaries are visualized.  No adnexal mass.

Other: No free fluid.

Musculoskeletal: Degenerative changes in the spine and hips, right
greater than left. Levoconvex scoliosis of the lumbar spine. Grade 1
anterolisthesis of L3 on L4 and L4 on L5. No worrisome lytic or
sclerotic lesions.
IMPRESSION: 1. No findings to explain the patient's given clinical history.
2. Cirrhosis.
3. Indeterminate left adrenal nodule, stable in size from
10/18/2018. A lipid poor adenoma is favored.
4. Tiny left renal stone.
5.  Aortic atherosclerosis ([id number]).

## 2020-07-02 ENCOUNTER — Ambulatory Visit (INDEPENDENT_AMBULATORY_CARE_PROVIDER_SITE_OTHER): Payer: Medicare Other | Admitting: *Deleted

## 2020-07-02 DIAGNOSIS — I4891 Unspecified atrial fibrillation: Secondary | ICD-10-CM | POA: Diagnosis not present

## 2020-07-02 DIAGNOSIS — Z5181 Encounter for therapeutic drug level monitoring: Secondary | ICD-10-CM | POA: Diagnosis not present

## 2020-07-02 LAB — POCT INR: INR: 1.2 — AB (ref 2.0–3.0)

## 2020-07-02 NOTE — Patient Instructions (Signed)
Take warfarin 2 tablets tonight and tomorrow night then resume 1 tablet daily except 1/2 tablet on Wednesdays and Saturdays Recheck INR 1 wk

## 2020-07-04 DIAGNOSIS — M25551 Pain in right hip: Secondary | ICD-10-CM | POA: Diagnosis not present

## 2020-07-10 DIAGNOSIS — H9192 Unspecified hearing loss, left ear: Secondary | ICD-10-CM | POA: Diagnosis not present

## 2020-07-10 DIAGNOSIS — Z9009 Acquired absence of other part of head and neck: Secondary | ICD-10-CM | POA: Diagnosis not present

## 2020-07-10 DIAGNOSIS — Z974 Presence of external hearing-aid: Secondary | ICD-10-CM | POA: Diagnosis not present

## 2020-07-10 DIAGNOSIS — H90A21 Sensorineural hearing loss, unilateral, right ear, with restricted hearing on the contralateral side: Secondary | ICD-10-CM | POA: Diagnosis not present

## 2020-07-10 DIAGNOSIS — H938X3 Other specified disorders of ear, bilateral: Secondary | ICD-10-CM | POA: Diagnosis not present

## 2020-07-11 ENCOUNTER — Other Ambulatory Visit: Payer: Self-pay | Admitting: Cardiology

## 2020-07-11 ENCOUNTER — Ambulatory Visit (INDEPENDENT_AMBULATORY_CARE_PROVIDER_SITE_OTHER): Payer: Medicare Other | Admitting: *Deleted

## 2020-07-11 DIAGNOSIS — I4891 Unspecified atrial fibrillation: Secondary | ICD-10-CM | POA: Diagnosis not present

## 2020-07-11 DIAGNOSIS — Z5181 Encounter for therapeutic drug level monitoring: Secondary | ICD-10-CM | POA: Diagnosis not present

## 2020-07-11 LAB — POCT INR: INR: 1.1 — AB (ref 2.0–3.0)

## 2020-07-11 NOTE — Patient Instructions (Addendum)
Take warfarin 2 tablets x 2 days then resume 1 tablet daily except 1/2 tablet on Wednesdays and Saturdays.  Recheck Monday.  See notes on why INR is low.

## 2020-07-16 ENCOUNTER — Other Ambulatory Visit: Payer: Self-pay

## 2020-07-16 ENCOUNTER — Ambulatory Visit (INDEPENDENT_AMBULATORY_CARE_PROVIDER_SITE_OTHER): Payer: Medicare Other | Admitting: *Deleted

## 2020-07-16 DIAGNOSIS — Z5181 Encounter for therapeutic drug level monitoring: Secondary | ICD-10-CM | POA: Diagnosis not present

## 2020-07-16 DIAGNOSIS — I4891 Unspecified atrial fibrillation: Secondary | ICD-10-CM | POA: Diagnosis not present

## 2020-07-16 LAB — POCT INR: INR: 2.2 (ref 2.0–3.0)

## 2020-07-16 NOTE — Patient Instructions (Signed)
Continue warfarin 1 tablet daily except 1/2 tablet on Wednesdays and Saturdays.  Recheck in 3 weeks

## 2020-07-18 DIAGNOSIS — M25551 Pain in right hip: Secondary | ICD-10-CM | POA: Diagnosis not present

## 2020-07-19 ENCOUNTER — Other Ambulatory Visit (INDEPENDENT_AMBULATORY_CARE_PROVIDER_SITE_OTHER): Payer: Self-pay | Admitting: *Deleted

## 2020-07-19 DIAGNOSIS — K7469 Other cirrhosis of liver: Secondary | ICD-10-CM

## 2020-07-26 ENCOUNTER — Ambulatory Visit: Payer: Self-pay | Attending: Internal Medicine

## 2020-07-26 ENCOUNTER — Encounter: Payer: Self-pay | Admitting: Cardiology

## 2020-07-26 ENCOUNTER — Ambulatory Visit (INDEPENDENT_AMBULATORY_CARE_PROVIDER_SITE_OTHER): Payer: Medicare Other | Admitting: Cardiology

## 2020-07-26 ENCOUNTER — Ambulatory Visit: Payer: Medicare Other | Admitting: Cardiology

## 2020-07-26 ENCOUNTER — Other Ambulatory Visit: Payer: Self-pay

## 2020-07-26 VITALS — BP 116/60 | HR 60 | Ht 59.0 in | Wt 118.6 lb

## 2020-07-26 DIAGNOSIS — I5022 Chronic systolic (congestive) heart failure: Secondary | ICD-10-CM

## 2020-07-26 DIAGNOSIS — Z79899 Other long term (current) drug therapy: Secondary | ICD-10-CM | POA: Diagnosis not present

## 2020-07-26 DIAGNOSIS — I480 Paroxysmal atrial fibrillation: Secondary | ICD-10-CM

## 2020-07-26 DIAGNOSIS — I340 Nonrheumatic mitral (valve) insufficiency: Secondary | ICD-10-CM | POA: Diagnosis not present

## 2020-07-26 DIAGNOSIS — Z23 Encounter for immunization: Secondary | ICD-10-CM

## 2020-07-26 DIAGNOSIS — I10 Essential (primary) hypertension: Secondary | ICD-10-CM | POA: Diagnosis not present

## 2020-07-26 NOTE — Progress Notes (Signed)
Clinical Summary Ms. Cavitt is a 86 y.o.female seen today for follow up of the following medical problems.     1. Chronic systolic heart failure - possibly chemo induced, secondary to Adriamycin.  - echo 01/2016 LVEF 40-45%, mild to moderate AI, mild to moderate MR, PASP 42 - previous radiation treatments, previous CXRs have shown some scarring and COPD changes however had normal PFTs   09/2018 echo LVEF 35-40%, diffuse hypokinesis, moderate MR, mild AI - issues with entresto and cost through there insurance , back on ARB.     01/2020 echo LVEF 40%, mod MR, mild to mod AI  - gradual worsening in breathing. Increased LE edema - taking lasix, taking 80mg  once daily.  115-120lbs - taking 60-80mg  daily x 2 months ago.  - limiting sodium intake.    2.Valvular heart disease - mild AI, mod MR by last echo  01/2020 echo LVEF 40%, mod MR, mild to mod AI  2. HTN   -she is compliantw ith meds  3. Paroxysmal afib  - has not been interested in NOACs, remains on coumadin  - no recent palpitations - compliant with coumadin, no bleeding issues    4. Breast CA - followed by oncology   5. Chronic pneumonitis - related to prior cancer treatments.      Travels regularly to [place] and [place] with her husband, he is a retired Software engineer.      Past Medical History:  Diagnosis Date  . Adenocarcinoma, breast ([place])    treated with lumpectomy, node dissection, chemo and RT  . Ataxia   . Breast cancer ([place]) 2009   IDC+DCIS of right breast; triple positive  . Cardiomyopathy 2009   possibly secondary to Adriamycin; EF 40% in 12/09 and 50% 3/10; pulmonary edema in 2009  . Chronic anticoagulation 04/16/2011  . Chronic kidney disease (CKD), stage I    when on diuretic with creatinine of 1.6  . Complication of anesthesia    patient vomits with demerol  . DJD (degenerative joint disease)    right knee and lumbosacral spine  . Hearing  impairment   . History of angina 2001   normal cornary arteries in 2001  . Hypertension   . Macular degeneration, dry    + cataracts  . Neuropathy    fingers and toes  . PONV (postoperative nausea and vomiting)   . Retinal macular atrophy   . Sick sinus syndrome ([facility])    Paroxysmal to permanent AF; initially first degree AV block also noted     Allergies  Allergen Reactions  . Altace [Ramipril] Rash     Current Outpatient Medications  Medication Sig Dispense Refill  . cholecalciferol (VITAMIN D) 1000 UNITS tablet Take 1,000 Units by mouth daily.      . dorzolamide (TRUSOPT) 2 % ophthalmic solution Place 1 drop into both eyes 2 (two) times daily.     . furosemide (LASIX) 20 MG tablet Take 60 mg daily - may take additional 20 mg as needed for swelling 180 tablet 3  . hyoscyamine (LEVSIN SL) 0.125 MG SL tablet DISSOLVE 1 TABLET UNDER THE TONGUE TWICE A DAY AS NEEDED 180 tablet 0  . latanoprost (XALATAN) 0.005 % ophthalmic solution Place 1 drop into both eyes at bedtime.   3  . loperamide (IMODIUM A-D) 2 MG tablet Take 1 tablet (2 mg total) by mouth 2 (two) times daily as needed for diarrhea or loose stools. 200 tablet 1  . metoprolol succinate (TOPROL XL)  25 MG 24 hr tablet Take 25 mg daily in addition to 50 mg , fora total of 75 mg daily 90 tablet 1  . metoprolol succinate (TOPROL-XL) 50 MG 24 hr tablet Take 1 tablet (50 mg total) by mouth daily. Take with or immediately following a meal. 90 tablet 1  . ondansetron (ZOFRAN) 8 MG tablet Take 1 tablet (8 mg total) by mouth every 8 (eight) hours as needed for nausea or vomiting. 30 tablet 1  . potassium chloride SA (K-DUR) 20 MEQ tablet Take 1 tablet (20 mEq total) by mouth daily. 90 tablet 3  . traMADol (ULTRAM) 50 MG tablet TAKE 1/2 TABLET BY MOUTH TWICE DAILY AS NEEDED FOR PAIN. ONLY TAKE IF NECESSARY. 30 tablet 0  . valsartan (DIOVAN) 80 MG tablet Take 1 tablet (80 mg total) by mouth daily. 90 tablet 3  . vitamin B-12  (CYANOCOBALAMIN) 1000 MCG tablet Take 1,000 mcg by mouth daily.    Marland Kitchen warfarin (COUMADIN) 5 MG tablet TAKE ONE TABLET BY MOUTH ONCE DAILY. EXCEPT 1/2 TABLET ON MONDAYS. 90 tablet 3   No current facility-administered medications for this visit.     Past Surgical History:  Procedure Laterality Date  . APPENDECTOMY    . CATARACT EXTRACTION W/PHACO  10/11/2012   Procedure: CATARACT EXTRACTION PHACO AND INTRAOCULAR LENS PLACEMENT (IOC);  Surgeon: Williams Che, MD;  Location: [facility];  Service: Ophthalmology;  Laterality: Right;  CDE:10.82  . COLONOSCOPY  02/11/2012   Colonoscopy plus polypectomy x2, [facility]  . COLONOSCOPY N/A 03/30/2017   Procedure: COLONOSCOPY;  Surgeon: Rogene Houston, MD;  Location: [facility];  Service: Endoscopy;  Laterality: N/A;  830  . DILATION AND CURETTAGE OF UTERUS    . ESOPHAGEAL DILATION N/A 01/19/2015   Procedure: ESOPHAGEAL DILATION;  Surgeon: Rogene Houston, MD;  Location: [facility];  Service: Endoscopy;  Laterality: N/A;  . ESOPHAGOGASTRODUODENOSCOPY N/A 01/19/2015   Procedure: ESOPHAGOGASTRODUODENOSCOPY (EGD);  Surgeon: Rogene Houston, MD;  Location: [facility];  Service: Endoscopy;  Laterality: N/A;  910  . MASTECTOMY PARTIAL / LUMPECTOMY W/ AXILLARY LYMPHADENECTOMY  2009   Carcinoma of the breast  . MICRODISCECTOMY LUMBAR  2009   lumbosacral spine  . MIDDLE EAR SURGERY     Left  . POLYPECTOMY  03/30/2017   Procedure: POLYPECTOMY;  Surgeon: Rogene Houston, MD;  Location: [facility];  Service: Endoscopy;;  colon  . PORT-A-CATH REMOVAL    . TONSILLECTOMY       Allergies  Allergen Reactions  . Altace [Ramipril] Rash      Family History  Problem Relation Age of Onset  . Heart failure Mother   . Bladder Cancer Maternal Uncle        smoker  . Ovarian cancer Maternal Grandmother        dx. 83s; +surgery  . Stroke Brother   . Breast cancer Maternal Aunt        dx. late 73s  . Breast cancer Cousin 58  . Prostate cancer Cousin 87    . Cancer Cousin        unspecified type  . Stroke Paternal Uncle      Social History Ms. Dolloff reports that she quit smoking about 64 years ago. Her smoking use included cigarettes. She started smoking about 65 years ago. She has never used smokeless tobacco. Ms. Findlay reports current alcohol use.   Review of Systems CONSTITUTIONAL: No weight loss, fever, chills, weakness or fatigue.  HEENT: Eyes: No  visual loss, blurred vision, double vision or yellow sclerae.No hearing loss, sneezing, congestion, runny nose or sore throat.  SKIN: No rash or itching.  CARDIOVASCULAR: per hpi RESPIRATORY: No shortness of breath, cough or sputum.  GASTROINTESTINAL: No anorexia, nausea, vomiting or diarrhea. No abdominal pain or blood.  GENITOURINARY: No burning on urination, no polyuria NEUROLOGICAL: No headache, dizziness, syncope, paralysis, ataxia, numbness or tingling in the extremities. No change in bowel or bladder control.  MUSCULOSKELETAL: No muscle, back pain, joint pain or stiffness.  LYMPHATICS: No enlarged nodes. No history of splenectomy.  PSYCHIATRIC: No history of depression or anxiety.  ENDOCRINOLOGIC: No reports of sweating, cold or heat intolerance. No polyuria or polydipsia.  Marland Kitchen   Physical Examination Today's Vitals   07/26/20 1416  BP: 116/60  Pulse: 60  SpO2: 96%  Weight: 118 lb 9.6 oz (53.8 kg)  Height: 4\' 11"  (1.499 m)   Body mass index is 23.95 kg/m.  Gen: resting comfortably, no acute distress HEENT: no scleral icterus, pupils equal round and reactive, no palptable cervical adenopathy,  CV: irreg, 2/6 systolic murmur apex, no jvd Resp: Clear to auscultation bilaterally GI: abdomen is soft, non-tender, non-distended, normal bowel sounds, no hepatosplenomegaly MSK: extremities are warm, trace bilateral edema Skin: warm, no rash Neuro:  no focal deficits Psych: appropriate affect   Diagnostic Studies  12/2013 Echo LVEF 45-50%, elevated LA pressure, mild  AI, moderate MR, mod TR, PASP 46  10/2014 Echo Study Conclusions  - Left ventricle: The cavity size was normal. Wall thickness was normal. Systolic function was mildly to moderately reduced. The estimated ejection fraction was in the range of 40% to 45%. - Aortic valve: There was mild regurgitation. Regurgitation pressure half-time: 794 ms. - Mitral valve: Mildly calcified annulus. There was mild to moderate regurgitation. MR vena contracta is 0.4 cm. - Left atrium: The atrium was severely dilated. - Right ventricle: The interventricular septum is flattened in systole and diastole, consistent with RV pressure and volume overload. The cavity size was mildly dilated. Systolic function was low normal. RV TAPSE is 1.6 cm. - Right atrium: The atrium was severely dilated. - Atrial septum: No defect or patent foramen ovale was identified. - Pulmonary arteries: Systolic pressure was moderately increased. PA peak pressure: 60 mm Hg (S). - Technically adequate study.   01/2016 echo Study Conclusions  - Left ventricle: The cavity size was normal. Wall thickness was  normal. Systolic function was mildly to moderately reduced. The  estimated ejection fraction was in the range of 40% to 45%.  Diffuse hypokinesis. - Aortic valve: Mildly calcified annulus. Trileaflet; mildly  thickened leaflets. There was mild to moderate regurgitation.  Valve area (VTI): 2.35 cm^2. Valve area (Vmax): 2.4 cm^2. Valve  area (Vmean): 2.6 cm^2. Regurgitation pressure half-time: 690 ms. - Mitral valve: Mildly calcified annulus. Mildly thickened leaflets  . There was mild to moderate regurgitation. - Left atrium: The atrium was severely dilated. - Right atrium: The atrium was severely dilated. - Pulmonary arteries: Systolic pressure was moderately increased.  PA peak pressure: 42 mm Hg (S). - Technically adequate study.  01/2020 echo IMPRESSIONS    1. Left ventricular ejection  fraction, by estimation, is 40%. The left  ventricle has mildly decreased function. The left ventricle demonstrates  global hypokinesis. There is mild left ventricular hypertrophy. Left  ventricular diastolic parameters are  consistent with Grade II diastolic dysfunction (pseudonormalization).  Elevated left atrial pressure.  2. Right ventricular systolic function is low normal. The right  ventricular size  is normal. There is moderately elevated pulmonary artery  systolic pressure.  3. Left atrial size was severely dilated.  4. Right atrial size was moderately dilated.  5. The MR is eccentric and posteriorally directed. May lead to  underestimation, the color jet itself is not that impressive due to the  eccentric course of the jet. The MV to AV VTI ratio is 1, suggesting  moderate MR.. The mitral valve is abnormal.  Moderate mitral valve regurgitation. No evidence of mitral stenosis.  6. Tricuspid valve regurgitation is mild to moderate.  7. The aortic valve is tricuspid. Aortic valve regurgitation is mild to  moderate. No aortic stenosis is present.  8. The inferior vena cava is normal in size with greater than 50%  respiratory variability, suggesting right atrial pressure of 3 mmHg.       Assessment and Plan  1. Chronic systolic HF -weights vary 115-120, around 120 increased edema and increased SOB/DOE - taking lasix 60-80mg  daily, counseled can take additionl 40mg  in afternoon in setting of significant swelling and symptoms - recheck bmet/Mg   2. Mitral regurgitation - continue to monitor, repeat echo likely next year   3. HTN  - at goal, continue current meds  4. Paroxysmal afib  - not interested in NOACs, continue coumadin.  - no symptoms, continue current meds   F/u 6 months       Arnoldo Lenis, M.D

## 2020-07-26 NOTE — Progress Notes (Signed)
° °  Covid-19 Vaccination Clinic  Name:  Raylea Adcox    MRN: [id number] DOB: 06-24-1932  07/26/2020  Mr. Steinmeyer was observed post Covid-19 immunization for 15 minutes without incident. He was provided with Vaccine Information Sheet and instruction to access the V-Safe system.   Mr. Mumaw was instructed to call 911 with any severe reactions post vaccine:  Difficulty breathing   Swelling of face and throat   A fast heartbeat   A bad rash all over body   Dizziness and weakness

## 2020-07-26 NOTE — Patient Instructions (Signed)
Medication Instructions:  Your physician recommends that you continue on your current medications as directed. Please refer to the Current Medication list given to you today.  *If you need a refill on your cardiac medications before your next appointment, please call your pharmacy*   Lab Work: Bmet,magnesium today   If you have labs (blood work) drawn today and your tests are completely normal, you will receive your results only by: Marland Kitchen MyChart Message (if you have MyChart) OR . A paper copy in the mail If you have any lab test that is abnormal or we need to change your treatment, we will call you to review the results.   Testing/Procedures:  None today   Follow-Up: At [facility], you and your health needs are our priority.  As part of our continuing mission to provide you with exceptional heart care, we have created designated Provider Care Teams.  These Care Teams include your primary Cardiologist (physician) and Advanced Practice Providers (APPs -  Physician Assistants and Nurse Practitioners) who all work together to provide you with the care you need, when you need it.  We recommend signing up for the patient portal called "MyChart".  Sign up information is provided on this After Visit Summary.  MyChart is used to connect with patients for Virtual Visits (Telemedicine).  Patients are able to view lab/test results, encounter notes, upcoming appointments, etc.  Non-urgent messages can be sent to your provider as well.   To learn more about what you can do with MyChart, go to [email or web address].    Your next appointment:   6 month(s)  The format for your next appointment:   In Person  Provider:   Carlyle Dolly, MD   Other Instructions None       Thank you for choosing [facility] !

## 2020-07-27 DIAGNOSIS — Z79899 Other long term (current) drug therapy: Secondary | ICD-10-CM | POA: Diagnosis not present

## 2020-07-27 DIAGNOSIS — K7469 Other cirrhosis of liver: Secondary | ICD-10-CM | POA: Diagnosis not present

## 2020-07-28 LAB — BASIC METABOLIC PANEL
BUN/Creatinine Ratio: 31 (calc) — ABNORMAL HIGH (ref 6–22)
BUN: 34 mg/dL — ABNORMAL HIGH (ref 7–25)
CO2: 25 mmol/L (ref 20–32)
Calcium: 9.2 mg/dL (ref 8.6–10.4)
Chloride: 104 mmol/L (ref 98–110)
Creat: 1.09 mg/dL — ABNORMAL HIGH (ref 0.60–0.88)
Glucose, Bld: 108 mg/dL (ref 65–139)
Potassium: 3.4 mmol/L — ABNORMAL LOW (ref 3.5–5.3)
Sodium: 142 mmol/L (ref 135–146)

## 2020-07-28 LAB — MAGNESIUM: Magnesium: 2.2 mg/dL (ref 1.5–2.5)

## 2020-07-31 ENCOUNTER — Telehealth: Payer: Self-pay | Admitting: *Deleted

## 2020-07-31 ENCOUNTER — Other Ambulatory Visit (INDEPENDENT_AMBULATORY_CARE_PROVIDER_SITE_OTHER): Payer: Self-pay | Admitting: Internal Medicine

## 2020-07-31 LAB — AFP TUMOR MARKER: AFP-Tumor Marker: 16 ng/mL — ABNORMAL HIGH

## 2020-07-31 NOTE — Telephone Encounter (Signed)
-----   Message from Arnoldo Lenis, MD sent at 07/31/2020 12:43 PM EDT ----- Potassium is a little low, would verify she is taking KCl 77mEq daily and if so increase to 83mEq daily  Zandra Abts MD

## 2020-08-01 ENCOUNTER — Other Ambulatory Visit (INDEPENDENT_AMBULATORY_CARE_PROVIDER_SITE_OTHER): Payer: Self-pay | Admitting: *Deleted

## 2020-08-01 DIAGNOSIS — K7469 Other cirrhosis of liver: Secondary | ICD-10-CM

## 2020-08-02 DIAGNOSIS — M25551 Pain in right hip: Secondary | ICD-10-CM | POA: Diagnosis not present

## 2020-08-03 DIAGNOSIS — H401133 Primary open-angle glaucoma, bilateral, severe stage: Secondary | ICD-10-CM | POA: Diagnosis not present

## 2020-08-03 DIAGNOSIS — H04123 Dry eye syndrome of bilateral lacrimal glands: Secondary | ICD-10-CM | POA: Diagnosis not present

## 2020-08-03 DIAGNOSIS — H353130 Nonexudative age-related macular degeneration, bilateral, stage unspecified: Secondary | ICD-10-CM | POA: Diagnosis not present

## 2020-08-03 DIAGNOSIS — Z961 Presence of intraocular lens: Secondary | ICD-10-CM | POA: Diagnosis not present

## 2020-08-06 NOTE — Telephone Encounter (Signed)
Pt husband (DPR) voiced understanding - updated medication list

## 2020-08-07 DIAGNOSIS — M47816 Spondylosis without myelopathy or radiculopathy, lumbar region: Secondary | ICD-10-CM | POA: Diagnosis not present

## 2020-08-09 ENCOUNTER — Other Ambulatory Visit: Payer: Self-pay

## 2020-08-09 ENCOUNTER — Ambulatory Visit (INDEPENDENT_AMBULATORY_CARE_PROVIDER_SITE_OTHER): Payer: Medicare Other | Admitting: *Deleted

## 2020-08-09 DIAGNOSIS — Z5181 Encounter for therapeutic drug level monitoring: Secondary | ICD-10-CM

## 2020-08-09 DIAGNOSIS — I4891 Unspecified atrial fibrillation: Secondary | ICD-10-CM

## 2020-08-09 LAB — POCT INR: INR: 2.4 (ref 2.0–3.0)

## 2020-08-09 NOTE — Patient Instructions (Signed)
Continue warfarin 1 tablet daily except 1/2 tablet on Wednesdays and Saturdays.  Recheck in 4 weeks in [place].

## 2020-08-10 DIAGNOSIS — H903 Sensorineural hearing loss, bilateral: Secondary | ICD-10-CM | POA: Diagnosis not present

## 2020-09-05 ENCOUNTER — Ambulatory Visit (INDEPENDENT_AMBULATORY_CARE_PROVIDER_SITE_OTHER): Payer: Medicare Other | Admitting: *Deleted

## 2020-09-05 DIAGNOSIS — I480 Paroxysmal atrial fibrillation: Secondary | ICD-10-CM | POA: Diagnosis not present

## 2020-09-05 DIAGNOSIS — Z5181 Encounter for therapeutic drug level monitoring: Secondary | ICD-10-CM | POA: Diagnosis not present

## 2020-09-05 LAB — PROTIME-INR: INR: 1.7 — AB (ref 0.9–1.1)

## 2020-09-05 NOTE — Patient Instructions (Signed)
Take warfarin 1 tablet tonight then resume 1 tablet daily except 1/2 tablet on Wednesdays and Saturdays.  Recheck in 2 weeks in office.  Spouse will call for appt when they get back in [place]

## 2020-09-19 DIAGNOSIS — Z23 Encounter for immunization: Secondary | ICD-10-CM | POA: Diagnosis not present

## 2020-09-27 ENCOUNTER — Other Ambulatory Visit: Payer: Self-pay

## 2020-09-27 ENCOUNTER — Ambulatory Visit (INDEPENDENT_AMBULATORY_CARE_PROVIDER_SITE_OTHER): Payer: Medicare Other | Admitting: *Deleted

## 2020-09-27 DIAGNOSIS — I4891 Unspecified atrial fibrillation: Secondary | ICD-10-CM

## 2020-09-27 DIAGNOSIS — Z5181 Encounter for therapeutic drug level monitoring: Secondary | ICD-10-CM | POA: Diagnosis not present

## 2020-09-27 DIAGNOSIS — M47816 Spondylosis without myelopathy or radiculopathy, lumbar region: Secondary | ICD-10-CM | POA: Diagnosis not present

## 2020-09-27 LAB — POCT INR: INR: 3 (ref 2.0–3.0)

## 2020-09-27 NOTE — Patient Instructions (Signed)
Continue warfarin 1 tablet daily except 1/2 tablet on Wednesdays and Saturdays.  Recheck in 4 weeks in office.

## 2020-09-28 DIAGNOSIS — H401133 Primary open-angle glaucoma, bilateral, severe stage: Secondary | ICD-10-CM | POA: Diagnosis not present

## 2020-09-28 DIAGNOSIS — H353131 Nonexudative age-related macular degeneration, bilateral, early dry stage: Secondary | ICD-10-CM | POA: Diagnosis not present

## 2020-09-28 DIAGNOSIS — Z961 Presence of intraocular lens: Secondary | ICD-10-CM | POA: Diagnosis not present

## 2020-09-28 DIAGNOSIS — H04123 Dry eye syndrome of bilateral lacrimal glands: Secondary | ICD-10-CM | POA: Diagnosis not present

## 2020-10-04 DIAGNOSIS — D692 Other nonthrombocytopenic purpura: Secondary | ICD-10-CM | POA: Diagnosis not present

## 2020-10-04 DIAGNOSIS — L570 Actinic keratosis: Secondary | ICD-10-CM | POA: Diagnosis not present

## 2020-10-04 DIAGNOSIS — D1801 Hemangioma of skin and subcutaneous tissue: Secondary | ICD-10-CM | POA: Diagnosis not present

## 2020-10-04 DIAGNOSIS — L821 Other seborrheic keratosis: Secondary | ICD-10-CM | POA: Diagnosis not present

## 2020-10-04 DIAGNOSIS — L814 Other melanin hyperpigmentation: Secondary | ICD-10-CM | POA: Diagnosis not present

## 2020-10-08 DIAGNOSIS — M542 Cervicalgia: Secondary | ICD-10-CM | POA: Diagnosis not present

## 2020-10-08 DIAGNOSIS — M47816 Spondylosis without myelopathy or radiculopathy, lumbar region: Secondary | ICD-10-CM | POA: Diagnosis not present

## 2020-10-08 DIAGNOSIS — M25511 Pain in right shoulder: Secondary | ICD-10-CM | POA: Diagnosis not present

## 2020-10-15 ENCOUNTER — Other Ambulatory Visit (INDEPENDENT_AMBULATORY_CARE_PROVIDER_SITE_OTHER): Payer: Self-pay | Admitting: *Deleted

## 2020-10-15 ENCOUNTER — Other Ambulatory Visit: Payer: Self-pay | Admitting: Cardiology

## 2020-10-15 DIAGNOSIS — K7469 Other cirrhosis of liver: Secondary | ICD-10-CM

## 2020-10-16 DIAGNOSIS — M25511 Pain in right shoulder: Secondary | ICD-10-CM | POA: Diagnosis not present

## 2020-10-16 DIAGNOSIS — M47816 Spondylosis without myelopathy or radiculopathy, lumbar region: Secondary | ICD-10-CM | POA: Diagnosis not present

## 2020-10-16 DIAGNOSIS — M542 Cervicalgia: Secondary | ICD-10-CM | POA: Diagnosis not present

## 2020-10-17 ENCOUNTER — Telehealth: Payer: Self-pay

## 2020-10-17 NOTE — Telephone Encounter (Signed)
   Primary Cardiologist: Carlyle Dolly, MD  Chart reviewed as part of pre-operative protocol coverage. Patient has an upcoming radiofrequency ablation planned and we were asked to give our recommendations for holding Coumadin.  Per Pharmacy and office protocol: Patient can hold Warfarin for 5 days prior to procedure. Patient will NOT need bridging with Lovenox (enoxaparin) around procedure.  I will route this recommendation to the requesting party via Epic fax function and remove from pre-op pool.  Please call with questions.  Darreld Mclean, PA-C 10/17/2020, 1:02 PM

## 2020-10-17 NOTE — Telephone Encounter (Signed)
   [facility] Pre-operative Risk Assessment    HEARTCARE STAFF: - Please ensure there is not already an duplicate clearance open for this procedure. - Under Visit Info/Reason for Call, type in Other and utilize the format Clearance MM/DD/YY or Clearance TBD. Do not use dashes or single digits. - If request is for dental extraction, please clarify the # of teeth to be extracted.  Request for surgical clearance:  1. What type of surgery is being performed? RADIOFREQUENCY ABLATION   2. When is this surgery scheduled? TBD   3. What type of clearance is required (medical clearance vs. Pharmacy clearance to hold med vs. Both)? PHARAMCY  4. Are there any medications that need to be held prior to surgery and how long? WARFARIN PRIOR TO PROCEDURE   5. Practice name and name of physician performing surgery?  [facility]; DR. Lake Bells IBAZEBO   6. What is the office phone number? [phone]   7.   What is the office fax number? [phone] [id number]:X-RAY  8.   Anesthesia type (None, local, MAC, general) ? NONE LISTED   Jacinta Shoe 10/17/2020, 11:44 AM  _________________________________________________________________   (provider comments below)

## 2020-10-17 NOTE — Telephone Encounter (Signed)
Pharmacy, can you please comment on how long patient can hold Coumadin for upcoming procedure?  Thank you!

## 2020-10-17 NOTE — Telephone Encounter (Signed)
Patient with diagnosis of afib on warfarin for anticoagulation.    Procedure: RADIOFREQUENCY ABLATION  Date of procedure: TBD    CHA2DS2-VASc Score = 5  This indicates a 7.2% annual risk of stroke. The patient's score is based upon: CHF History: 1 HTN History: 1 Diabetes History: 0 Stroke History: 0 Vascular Disease History: 0 Age Score: 2 Gender Score: 1      Per office protocol, patient can hold warfarin for 5 days prior to procedure.    Patient will NOT need bridging with Lovenox (enoxaparin) around procedure.

## 2020-10-24 ENCOUNTER — Other Ambulatory Visit (INDEPENDENT_AMBULATORY_CARE_PROVIDER_SITE_OTHER): Payer: Self-pay | Admitting: Internal Medicine

## 2020-10-29 ENCOUNTER — Telehealth (INDEPENDENT_AMBULATORY_CARE_PROVIDER_SITE_OTHER): Payer: Self-pay | Admitting: *Deleted

## 2020-10-29 ENCOUNTER — Ambulatory Visit (INDEPENDENT_AMBULATORY_CARE_PROVIDER_SITE_OTHER): Payer: Medicare Other | Admitting: *Deleted

## 2020-10-29 DIAGNOSIS — I4891 Unspecified atrial fibrillation: Secondary | ICD-10-CM | POA: Diagnosis not present

## 2020-10-29 DIAGNOSIS — Z5181 Encounter for therapeutic drug level monitoring: Secondary | ICD-10-CM | POA: Diagnosis not present

## 2020-10-29 DIAGNOSIS — M47816 Spondylosis without myelopathy or radiculopathy, lumbar region: Secondary | ICD-10-CM | POA: Diagnosis not present

## 2020-10-29 LAB — POCT INR: INR: 1.1 — AB (ref 2.0–3.0)

## 2020-10-29 NOTE — Patient Instructions (Signed)
Pending Radiofrequency ablation today.  Has been off warfarin 5 days Resume warfarin tonight if OK with MD.  Take 1 1/2 tablets x 2 days then resume 1 tablet daily except 1/2 tablet on Wednesdays and Saturdays.  Recheck in 2 weeks in office.

## 2020-10-29 NOTE — Telephone Encounter (Signed)
Patient's husband, Dr. Renard Matter presented to office this morning asking that a new prescription be sent in for his wife for the Dicyclomine 10 mg. He states that she takes 1 by mouth every morning and 1 by mouth every evening. Per their Insurance he ask that #180 be called in with 3 refills.  This was addressed with Dr.Castaneda and he approved that this could be called in. Called to [facility].  Patient was last seen on 01/30/2020 , and her follow  Up is 03/12/2021.

## 2020-11-01 DIAGNOSIS — K7469 Other cirrhosis of liver: Secondary | ICD-10-CM | POA: Diagnosis not present

## 2020-11-02 LAB — HEPATIC FUNCTION PANEL
AG Ratio: 1.9 (calc) (ref 1.0–2.5)
ALT: 23 U/L (ref 6–29)
AST: 27 U/L (ref 10–35)
Albumin: 4.2 g/dL (ref 3.6–5.1)
Alkaline phosphatase (APISO): 128 U/L (ref 37–153)
Bilirubin, Direct: 0.3 mg/dL — ABNORMAL HIGH (ref 0.0–0.2)
Globulin: 2.2 g/dL (calc) (ref 1.9–3.7)
Indirect Bilirubin: 1 mg/dL (calc) (ref 0.2–1.2)
Total Bilirubin: 1.3 mg/dL — ABNORMAL HIGH (ref 0.2–1.2)
Total Protein: 6.4 g/dL (ref 6.1–8.1)

## 2020-11-06 ENCOUNTER — Other Ambulatory Visit (INDEPENDENT_AMBULATORY_CARE_PROVIDER_SITE_OTHER): Payer: Self-pay | Admitting: Internal Medicine

## 2020-11-06 DIAGNOSIS — K7469 Other cirrhosis of liver: Secondary | ICD-10-CM

## 2020-11-07 NOTE — Progress Notes (Addendum)
Cardiology Office Note  Date: 11/08/2020   ID: Alicia Harrington, DOB [birth year], MRN [id number]  PCP:  [name], MD  Cardiologist:  Carlyle Dolly, MD Electrophysiologist:  None   Chief Complaint: Follow-up chronic systolic heart failure  History of Present Illness: Alicia Harrington is a 86 y.o. female with a history of chronic systolic heart failure, breast CA status post chemo and radiation, cardiomyopathy, CKD, HTN, PAF.  Last encounter with Dr. Harl Bowie 07/26/2020.  Last echocardiogram March 2021 EF 40%, moderate MR, mild to moderate AI, gradual worsening of breathing, increased lower extremity edema.  Taking Lasix 80 mg daily.  Weight around 115 to 120 pounds.  Was taking 60 to 80 mg daily x 2 months ago.  She was limiting sodium intake.  Mild AI and moderate MR by previous echo.  She was compliant with hypertension meds.  No recent palpitations.  Compliant with Coumadin and no bleeding issues.  Followed by oncology for breast CA.  Chronic pneumonitis related to prior cancer treatment.   She is here today with complaints of swelling in her lower extremities.  Her husband who is with her states this has been going on for a few weeks now.  She denies any dyspnea at rest but voices increased dyspnea on exertion.  States she can walk a moderate distance and becomes short of breath and needs to stop and rest.  On her previous echo in March of this year EF was 40%  with LV global hypokinesis, mild LVH, G2 DD, severely dilated left atrium, moderately dilated right atrium, MR was eccentric and posteriorly directed.  MD to AV VTI ratio was 1 suggesting moderate MR, TR was mild to moderate, AR mild to moderate.  She has 1+ pitting edema in her lower extremities.  She denies any anginal or exertional symptoms.  Voices some mild dizziness mostly when turning in bed.  Denies any significant palpitations or arrhythmias.  She has chronic atrial fibrillation with controlled rate.  She denies any recent  palpitations or arrhythmias.  Heart rate today is 53 and irregularly irregular.    Past Medical History:  Diagnosis Date  . Adenocarcinoma, breast ([place])    treated with lumpectomy, node dissection, chemo and RT  . Ataxia   . Breast cancer ([place]) 2009   IDC+DCIS of right breast; triple positive  . Cardiomyopathy 2009   possibly secondary to Adriamycin; EF 40% in 12/09 and 50% 3/10; pulmonary edema in 2009  . Chronic anticoagulation 04/16/2011  . Chronic kidney disease (CKD), stage I    when on diuretic with creatinine of 1.6  . Complication of anesthesia    patient vomits with demerol  . DJD (degenerative joint disease)    right knee and lumbosacral spine  . Hearing impairment   . History of angina 2001   normal cornary arteries in 2001  . Hypertension   . Macular degeneration, dry    + cataracts  . Neuropathy    fingers and toes  . PONV (postoperative nausea and vomiting)   . Retinal macular atrophy   . Sick sinus syndrome (HCC)    Paroxysmal to permanent AF; initially first degree AV block also noted    Past Surgical History:  Procedure Laterality Date  . APPENDECTOMY    . CATARACT EXTRACTION W/PHACO  10/11/2012   Procedure: CATARACT EXTRACTION PHACO AND INTRAOCULAR LENS PLACEMENT (IOC);  Surgeon: Williams Che, MD;  Location: [facility];  Service: Ophthalmology;  Laterality: Right;  CDE:10.82  . COLONOSCOPY  02/11/2012   Colonoscopy plus polypectomy x2, [facility]  . COLONOSCOPY N/A 03/30/2017   Procedure: COLONOSCOPY;  Surgeon: Rogene Houston, MD;  Location: [facility];  Service: Endoscopy;  Laterality: N/A;  830  . DILATION AND CURETTAGE OF UTERUS    . ESOPHAGEAL DILATION N/A 01/19/2015   Procedure: ESOPHAGEAL DILATION;  Surgeon: Rogene Houston, MD;  Location: [facility];  Service: Endoscopy;  Laterality: N/A;  . ESOPHAGOGASTRODUODENOSCOPY N/A 01/19/2015   Procedure: ESOPHAGOGASTRODUODENOSCOPY (EGD);  Surgeon: Rogene Houston, MD;  Location: [facility];  Service:  Endoscopy;  Laterality: N/A;  910  . MASTECTOMY PARTIAL / LUMPECTOMY W/ AXILLARY LYMPHADENECTOMY  2009   Carcinoma of the breast  . MICRODISCECTOMY LUMBAR  2009   lumbosacral spine  . MIDDLE EAR SURGERY     Left  . POLYPECTOMY  03/30/2017   Procedure: POLYPECTOMY;  Surgeon: Rogene Houston, MD;  Location: [facility];  Service: Endoscopy;;  colon  . PORT-A-CATH REMOVAL    . TONSILLECTOMY      Current Outpatient Medications  Medication Sig Dispense Refill  . cholecalciferol (VITAMIN D) 1000 UNITS tablet Take 1,000 Units by mouth daily.      . dorzolamide (TRUSOPT) 2 % ophthalmic solution Place 1 drop into both eyes 2 (two) times daily.     . furosemide (LASIX) 80 MG tablet Take 80 mg by mouth.    . hyoscyamine (LEVSIN SL) 0.125 MG SL tablet DISSOLVE 1 TABLET UNDER THE TONGUE TWICE A DAY AS NEEDED 180 tablet 0  . latanoprost (XALATAN) 0.005 % ophthalmic solution Place 1 drop into both eyes at bedtime.   3  . loperamide (IMODIUM A-D) 2 MG tablet TAKE ONE TABLET BY MOUTH TWICE DAILY AS NEEDED FOR DIARRHEA OR LOOSE STOOLS 200 tablet 0  . metoprolol succinate (TOPROL-XL) 25 MG 24 hr tablet TAKE 1 TABLET IN ADDITION TO 50 MG TABLETS. 90 tablet 3  . metoprolol succinate (TOPROL-XL) 50 MG 24 hr tablet Take 1 tablet (50 mg total) by mouth daily. Take with or immediately following a meal. 90 tablet 1  . ondansetron (ZOFRAN) 8 MG tablet Take 1 tablet (8 mg total) by mouth every 8 (eight) hours as needed for nausea or vomiting. 30 tablet 1  . potassium chloride (KLOR-CON) 20 MEQ packet Take 40 mEq by mouth daily.    . traMADol (ULTRAM) 50 MG tablet TAKE 1/2 TABLET BY MOUTH TWICE DAILY AS NEEDED FOR PAIN. ONLY TAKE IF NECESSARY. 30 tablet 0  . valsartan (DIOVAN) 80 MG tablet Take 1 tablet (80 mg total) by mouth daily. 90 tablet 3  . vitamin B-12 (CYANOCOBALAMIN) 1000 MCG tablet Take 1,000 mcg by mouth daily.    Marland Kitchen warfarin (COUMADIN) 5 MG tablet TAKE ONE TABLET BY MOUTH ONCE DAILY. EXCEPT 1/2 TABLET ON  MONDAYS. 90 tablet 3   No current facility-administered medications for this visit.   Allergies:  Altace [ramipril]   Social History: The patient  reports that she quit smoking about 64 years ago. Her smoking use included cigarettes. She started smoking about 66 years ago. She has never used smokeless tobacco. She reports current alcohol use. She reports that she does not use drugs.   Family History: The patient's family history includes Bladder Cancer in her maternal uncle; Breast cancer in her maternal aunt; Breast cancer (age of onset: 32) in her cousin; Cancer in her cousin; Heart failure in her mother; Ovarian cancer in her maternal grandmother; Prostate cancer (age of onset: 28) in her  cousin; Stroke in her brother and paternal uncle.   ROS:  Please see the history of present illness. Otherwise, complete review of systems is positive for none.  All other systems are reviewed and negative.   Physical Exam: VS:  BP 118/72   Pulse (!) 53   Ht 4\' 11"  (1.499 m)   Wt 118 lb (53.5 kg)   SpO2 98%   BMI 23.83 kg/m , BMI Body mass index is 23.83 kg/m.  Wt Readings from Last 3 Encounters:  11/08/20 118 lb (53.5 kg)  07/26/20 118 lb 9.6 oz (53.8 kg)  02/08/20 122 lb 9.6 oz (55.6 kg)    General: Patient appears comfortable at rest. Neck: Supple, no elevated JVP or carotid bruits, no thyromegaly. Lungs: Clear to auscultation, nonlabored breathing at rest. Cardiac: Regular rate and rhythm, no S3 or significant systolic murmur, no pericardial rub. Abdomen: Soft, nontender, no hepatomegaly, bowel sounds present, no guarding or rebound. Extremities: No pitting edema, distal pulses 2+. Skin: Warm and dry. Musculoskeletal: No kyphosis. Neuropsychiatric: Alert and oriented x3, affect grossly appropriate.  ECG:    Recent Labwork: 07/27/2020: BUN 34; Creat 1.09; Magnesium 2.2; Potassium 3.4; Sodium 142 11/01/2020: ALT 23; AST 27     Component Value Date/Time   CHOL  09/13/2008 0435    143         ATP III CLASSIFICATION:  <200     mg/dL   Desirable  200-239  mg/dL   Borderline High  >=240    mg/dL   High   TRIG 61 09/13/2008 0435   HDL 41 09/13/2008 0435   CHOLHDL 3.5 09/13/2008 0435   VLDL 12 09/13/2008 0435   LDLCALC  09/13/2008 0435    90        Total Cholesterol/HDL:CHD Risk Coronary Heart Disease Risk Table                     Men   Women  1/2 Average Risk   3.4   3.3    Other Studies Reviewed Today:  12/2013 Echo LVEF 45-50%, elevated LA pressure, mild AI, moderate MR, mod TR, PASP 46  10/2014 Echo Study Conclusions  - Left ventricle: The cavity size was normal. Wall thickness was normal. Systolic function was mildly to moderately reduced. The estimated ejection fraction was in the range of 40% to 45%. - Aortic valve: There was mild regurgitation. Regurgitation pressure half-time: 794 ms. - Mitral valve: Mildly calcified annulus. There was mild to moderate regurgitation. MR vena contracta is 0.4 cm. - Left atrium: The atrium was severely dilated. - Right ventricle: The interventricular septum is flattened in systole and diastole, consistent with RV pressure and volume overload. The cavity size was mildly dilated. Systolic function was low normal. RV TAPSE is 1.6 cm. - Right atrium: The atrium was severely dilated. - Atrial septum: No defect or patent foramen ovale was identified. - Pulmonary arteries: Systolic pressure was moderately increased. PA peak pressure: 60 mm Hg (S). - Technically adequate study.   01/2016 echo Study Conclusions  - Left ventricle: The cavity size was normal. Wall thickness was  normal. Systolic function was mildly to moderately reduced. The  estimated ejection fraction was in the range of 40% to 45%.  Diffuse hypokinesis. - Aortic valve: Mildly calcified annulus. Trileaflet; mildly  thickened leaflets. There was mild to moderate regurgitation.  Valve area (VTI): 2.35 cm^2. Valve area (Vmax): 2.4  cm^2. Valve  area (Vmean): 2.6 cm^2. Regurgitation pressure half-time: 690 ms. -  Mitral valve: Mildly calcified annulus. Mildly thickened leaflets  . There was mild to moderate regurgitation. - Left atrium: The atrium was severely dilated. - Right atrium: The atrium was severely dilated. - Pulmonary arteries: Systolic pressure was moderately increased.  PA peak pressure: 42 mm Hg (S). - Technically adequate study.  01/2020 echo IMPRESSIONS    1. Left ventricular ejection fraction, by estimation, is 40%. The left  ventricle has mildly decreased function. The left ventricle demonstrates  global hypokinesis. There is mild left ventricular hypertrophy. Left  ventricular diastolic parameters are  consistent with Grade II diastolic dysfunction (pseudonormalization).  Elevated left atrial pressure.  2. Right ventricular systolic function is low normal. The right  ventricular size is normal. There is moderately elevated pulmonary artery  systolic pressure.  3. Left atrial size was severely dilated.  4. Right atrial size was moderately dilated.  5. The MR is eccentric and posteriorally directed. May lead to  underestimation, the color jet itself is not that impressive due to the  eccentric course of the jet. The MV to AV VTI ratio is 1, suggesting  moderate MR.. The mitral valve is abnormal.  Moderate mitral valve regurgitation. No evidence of mitral stenosis.  6. Tricuspid valve regurgitation is mild to moderate.  7. The aortic valve is tricuspid. Aortic valve regurgitation is mild to  moderate. No aortic stenosis is present.  8. The inferior vena cava is normal in size with greater than 50%  respiratory variability, suggesting right atrial pressure of 3 mmHg.      Assessment and Plan:  1. Chronic systolic heart failure ([place])   2. Mitral valve insufficiency, unspecified etiology   3. Essential hypertension   4. Paroxysmal atrial fibrillation (HCC)   5. Lower  extremity edema    1. Chronic systolic heart failure ([place]) Echo in March of this year EF was 40%.  With LV global hypokinesis, mild LVH, G2 DD, severely dilated left atrium, moderately dilated right atrium, MR was eccentric and posteriorly directed.  MD to AV VTI ratio was 1 suggesting moderate MR, TR was mild to moderate, AR mild to moderate.  Today she presents with 1+ pitting edema bilaterally.  Complaining of increasing dyspnea on exertion when walking moderate distances.  States he has to stop at intervals to rest more frequently.  Increase Lasix to 100 mg on alternate days and 80 mg on alternate days.  Get a basic metabolic panel and magnesium in 2 weeks.  Get a repeat echocardiogram.  2. Mitral valve insufficiency, unspecified etiology Last echo and March 2021 showed moderate MR withTR was mild to moderate, AR mild to moderate.  We are scheduling a repeat echocardiogram at [facility] to reassess LV function, diastolic function, valvular function.  3. Essential hypertension Blood pressure is well controlled on current therapy.  Blood pressure today 118/72.  Continue valsartan 80 mg daily.  4. Paroxysmal atrial fibrillation [facility]) Husband states her atrial fibrillation is permanent.  She has had 3 attempts at ablation without success in the past.  Today her heart rate is irregularly irregular.  Heart rate is 53.  She denies any episodes of rapid atrial fibrillation/palpitations/arrhythmias.  Continue Toprol-XL 75 mg daily.  Continue Coumadin 5 mg p.o. daily.  5.  Lower extremity edema/DOE As noted above in #1 she has increasing lower extremity edema per her statement.  Increasing dyspnea on exertion.  We are increasing Lasix to 100 mg on alternate days and 80 mg on alternate days.   Medication Adjustments/Labs and Tests  Ordered: Current medicines are reviewed at length with the patient today.  Concerns regarding medicines are outlined above.   Disposition: Follow-up with Dr. Harl Bowie or [facility] 4  months ?  Follow-up may be be delayed due to patient and husband going on 42-month vacation to [place] on January 4.  They have requested echocardiogram to  be done prior to them leaving for vacation on January 4.  Signed, Levell July, NP 11/08/2020 9:03 AM    [facility] [address], [place], [place] [address] Phone: [phone]; Fax: [phone]

## 2020-11-08 ENCOUNTER — Telehealth: Payer: Self-pay | Admitting: Family Medicine

## 2020-11-08 ENCOUNTER — Encounter: Payer: Self-pay | Admitting: Family Medicine

## 2020-11-08 ENCOUNTER — Ambulatory Visit (INDEPENDENT_AMBULATORY_CARE_PROVIDER_SITE_OTHER): Payer: Medicare Other | Admitting: Family Medicine

## 2020-11-08 VITALS — BP 118/72 | HR 53 | Ht 59.0 in | Wt 118.0 lb

## 2020-11-08 DIAGNOSIS — I10 Essential (primary) hypertension: Secondary | ICD-10-CM | POA: Diagnosis not present

## 2020-11-08 DIAGNOSIS — R0600 Dyspnea, unspecified: Secondary | ICD-10-CM | POA: Diagnosis not present

## 2020-11-08 DIAGNOSIS — R600 Localized edema: Secondary | ICD-10-CM | POA: Diagnosis not present

## 2020-11-08 DIAGNOSIS — I5022 Chronic systolic (congestive) heart failure: Secondary | ICD-10-CM

## 2020-11-08 DIAGNOSIS — I340 Nonrheumatic mitral (valve) insufficiency: Secondary | ICD-10-CM

## 2020-11-08 DIAGNOSIS — I480 Paroxysmal atrial fibrillation: Secondary | ICD-10-CM

## 2020-11-08 DIAGNOSIS — R0609 Other forms of dyspnea: Secondary | ICD-10-CM

## 2020-11-08 DIAGNOSIS — K7469 Other cirrhosis of liver: Secondary | ICD-10-CM | POA: Diagnosis not present

## 2020-11-08 MED ORDER — FUROSEMIDE 20 MG PO TABS: 20.0000 mg | ORAL_TABLET | ORAL | 1 refills | Status: DC

## 2020-11-08 NOTE — Patient Instructions (Signed)
Your physician recommends that you schedule a follow-up appointment in: [place], NP  Your physician has recommended you make the following change in your medication:   ALTERNATE LASIX 80 MG EVERY OTHER DAY WITH 100 MG [place]  Your physician recommends that you return for lab work in: [place] BMP/MG  Your physician has requested that you have an echocardiogram. Echocardiography is a painless test that uses sound waves to create images of your heart. It provides your doctor with information about the size and shape of your heart and how well your heart's chambers and valves are working. This procedure takes approximately one hour. There are no restrictions for this procedure.  Thank you for choosing [facility]!!

## 2020-11-08 NOTE — Telephone Encounter (Signed)
Pre-cert Verification for the following procedure    ECHO   DATE: 11/13/2020  LOCATION:  [facility]

## 2020-11-09 LAB — AFP TUMOR MARKER: AFP-Tumor Marker: 14.6 ng/mL — ABNORMAL HIGH

## 2020-11-12 ENCOUNTER — Ambulatory Visit (INDEPENDENT_AMBULATORY_CARE_PROVIDER_SITE_OTHER): Payer: Medicare Other | Admitting: *Deleted

## 2020-11-12 DIAGNOSIS — I4891 Unspecified atrial fibrillation: Secondary | ICD-10-CM | POA: Diagnosis not present

## 2020-11-12 DIAGNOSIS — Z5181 Encounter for therapeutic drug level monitoring: Secondary | ICD-10-CM | POA: Diagnosis not present

## 2020-11-12 LAB — POCT INR: INR: 1.8 — AB (ref 2.0–3.0)

## 2020-11-12 NOTE — Patient Instructions (Signed)
Take warfarin 1 1/2 tablets tonight then resume 1 tablet daily except 1/2 tablet on Wednesdays and Saturdays.  Recheck in 4 weeks in office.

## 2020-11-13 ENCOUNTER — Other Ambulatory Visit: Payer: Self-pay

## 2020-11-13 ENCOUNTER — Ambulatory Visit (HOSPITAL_COMMUNITY)
Admission: RE | Admit: 2020-11-13 | Discharge: 2020-11-13 | Disposition: A | Discharge status: Home / Self Care | Payer: Medicare Other | Source: Ambulatory Visit | Attending: Family Medicine | Admitting: Family Medicine

## 2020-11-13 DIAGNOSIS — R0600 Dyspnea, unspecified: Secondary | ICD-10-CM | POA: Diagnosis not present

## 2020-11-13 DIAGNOSIS — R0609 Other forms of dyspnea: Secondary | ICD-10-CM

## 2020-11-13 LAB — ECHOCARDIOGRAM COMPLETE
Area-P 1/2: 6.48 cm2
Calc EF: 44.7 %
MV M vel: 4.4 m/s
MV Peak grad: 77.4 mmHg
P 1/2 time: 658 msec
Radius: 0.5 cm
S' Lateral: 4.2 cm
Single Plane A2C EF: 46.1 %
Single Plane A4C EF: 46.9 %

## 2020-11-13 NOTE — Progress Notes (Signed)
*  PRELIMINARY RESULTS* Echocardiogram 2D Echocardiogram has been performed.  Alicia Harrington 11/13/2020, 9:25 AM

## 2020-11-15 ENCOUNTER — Telehealth: Payer: Self-pay | Admitting: *Deleted

## 2020-11-15 NOTE — Telephone Encounter (Signed)
-----   Message from Verta Ellen., NP sent at 11/13/2020  4:15 PM EST ----- Please call the patient and let her know the echocardiogram showed her pumping function remains approximately the same as it was in March of this past year.  Ejection fraction is 35 to 40% on echo today.  Back in March it was estimated at 40%.  Mitral valve has some moderate leaking.  This is unchanged from previous echo in March of this year.  Aortic valve has some mild leaking.  This is also essentially unchanged from March of this year.  No significant changes on today's echo compared to the echo in March that would explain her increased dyspnea on exertion.  Thank you.

## 2020-11-15 NOTE — Telephone Encounter (Signed)
Patient's husband informed. Copy sent to PCP 

## 2020-11-22 DIAGNOSIS — M47816 Spondylosis without myelopathy or radiculopathy, lumbar region: Secondary | ICD-10-CM | POA: Diagnosis not present

## 2020-11-26 ENCOUNTER — Other Ambulatory Visit: Payer: Self-pay | Admitting: Family Medicine

## 2020-11-26 ENCOUNTER — Ambulatory Visit (INDEPENDENT_AMBULATORY_CARE_PROVIDER_SITE_OTHER): Payer: Medicare Other | Admitting: *Deleted

## 2020-11-26 ENCOUNTER — Other Ambulatory Visit: Payer: Self-pay

## 2020-11-26 DIAGNOSIS — I4891 Unspecified atrial fibrillation: Secondary | ICD-10-CM

## 2020-11-26 DIAGNOSIS — Z5181 Encounter for therapeutic drug level monitoring: Secondary | ICD-10-CM

## 2020-11-26 DIAGNOSIS — I5022 Chronic systolic (congestive) heart failure: Secondary | ICD-10-CM | POA: Diagnosis not present

## 2020-11-26 DIAGNOSIS — I340 Nonrheumatic mitral (valve) insufficiency: Secondary | ICD-10-CM | POA: Diagnosis not present

## 2020-11-26 LAB — POCT INR: INR: 2 (ref 2.0–3.0)

## 2020-11-26 NOTE — Patient Instructions (Signed)
Increase warfarin to 1 tablet daily except 1/2 tablet on Saturdays.  Recheck in 3 weeks in [place].   Will call pt with instructions Pt having BMP and magnesium checked today

## 2020-11-27 LAB — BASIC METABOLIC PANEL WITH GFR
BUN/Creatinine Ratio: 29 (calc) — ABNORMAL HIGH (ref 6–22)
BUN: 33 mg/dL — ABNORMAL HIGH (ref 7–25)
CO2: 29 mmol/L (ref 20–32)
Calcium: 9.6 mg/dL (ref 8.6–10.4)
Chloride: 106 mmol/L (ref 98–110)
Creat: 1.12 mg/dL — ABNORMAL HIGH (ref 0.60–0.88)
GFR, Est African American: 52 mL/min/{1.73_m2} — ABNORMAL LOW (ref >=60)
GFR, Est Non African American: 44 mL/min/{1.73_m2} — ABNORMAL LOW (ref >=60)
Glucose, Bld: 92 mg/dL (ref 65–139)
Potassium: 3.5 mmol/L (ref 3.5–5.3)
Sodium: 145 mmol/L (ref 135–146)

## 2020-11-27 LAB — MAGNESIUM: Magnesium: 2.2 mg/dL (ref 1.5–2.5)

## 2020-12-11 ENCOUNTER — Other Ambulatory Visit: Payer: Self-pay | Admitting: Cardiology

## 2020-12-11 MED ORDER — FUROSEMIDE 20 MG PO TABS: 20.0000 mg | ORAL_TABLET | ORAL | 3 refills | Status: DC

## 2020-12-11 MED ORDER — FUROSEMIDE 80 MG PO TABS: 80.0000 mg | ORAL_TABLET | Freq: Every day | ORAL | 3 refills | Status: DC

## 2020-12-12 LAB — POCT INR: INR: 1.8 — AB (ref 2.0–3.0)

## 2020-12-18 DIAGNOSIS — I4891 Unspecified atrial fibrillation: Secondary | ICD-10-CM | POA: Diagnosis not present

## 2020-12-18 DIAGNOSIS — Z5181 Encounter for therapeutic drug level monitoring: Secondary | ICD-10-CM | POA: Diagnosis not present

## 2020-12-19 ENCOUNTER — Ambulatory Visit (INDEPENDENT_AMBULATORY_CARE_PROVIDER_SITE_OTHER): Payer: Medicare Other | Admitting: *Deleted

## 2020-12-19 ENCOUNTER — Telehealth: Payer: Self-pay | Admitting: Cardiology

## 2020-12-19 LAB — POCT INR: INR: 1.8 — AB (ref 2.0–3.0)

## 2020-12-19 NOTE — Telephone Encounter (Signed)
Called spouse with coumadin instructions.  He verbalized understanding.  See coumadin note.

## 2020-12-19 NOTE — Telephone Encounter (Signed)
New message   Per Dr Jama Flavors INR is 1.8

## 2020-12-19 NOTE — Patient Instructions (Signed)
Take warfarin 1 1/2 tablets tonight then increase dose to 1 tablet daily.  Recheck in 2 weeks in [place].  (Will be there 3 months) Called pt with instructions

## 2020-12-31 ENCOUNTER — Other Ambulatory Visit: Payer: Self-pay

## 2020-12-31 MED ORDER — POTASSIUM CHLORIDE 20 MEQ PO PACK: 40.0000 meq | PACK | Freq: Every day | ORAL | 3 refills | Status: DC

## 2020-12-31 NOTE — Telephone Encounter (Signed)
*-*-*  This message has not been handled.*-*-*  Please call in a prescription for K for Alicia Harrington (DOB 06-10-2032) to [facility], [place].  Tel.  [phone].  Thanks, Viviano Simas (Spouse)   Refilled per request

## 2021-01-01 DIAGNOSIS — Z5181 Encounter for therapeutic drug level monitoring: Secondary | ICD-10-CM | POA: Diagnosis not present

## 2021-01-01 DIAGNOSIS — I4891 Unspecified atrial fibrillation: Secondary | ICD-10-CM | POA: Diagnosis not present

## 2021-01-01 LAB — POCT INR: INR: 1.9 — AB (ref 2.0–3.0)

## 2021-01-02 ENCOUNTER — Ambulatory Visit (INDEPENDENT_AMBULATORY_CARE_PROVIDER_SITE_OTHER): Payer: Medicare Other | Admitting: *Deleted

## 2021-01-02 ENCOUNTER — Other Ambulatory Visit: Payer: Self-pay | Admitting: Cardiology

## 2021-01-02 DIAGNOSIS — I4891 Unspecified atrial fibrillation: Secondary | ICD-10-CM

## 2021-01-02 DIAGNOSIS — Z5181 Encounter for therapeutic drug level monitoring: Secondary | ICD-10-CM

## 2021-01-02 LAB — POCT INR

## 2021-01-02 NOTE — Patient Instructions (Signed)
Increase warfarin to 1 tablet daily except 1 1/2 tablets on Wednesdays  Recheck in 3 weeks in [place].  (Will be there 3 months) Called pt with instructions

## 2021-01-10 ENCOUNTER — Other Ambulatory Visit (INDEPENDENT_AMBULATORY_CARE_PROVIDER_SITE_OTHER): Payer: Self-pay | Admitting: *Deleted

## 2021-01-10 DIAGNOSIS — K7469 Other cirrhosis of liver: Secondary | ICD-10-CM

## 2021-01-23 DIAGNOSIS — I4891 Unspecified atrial fibrillation: Secondary | ICD-10-CM | POA: Diagnosis not present

## 2021-01-23 DIAGNOSIS — Z5181 Encounter for therapeutic drug level monitoring: Secondary | ICD-10-CM | POA: Diagnosis not present

## 2021-01-23 LAB — POCT INR: INR: 2.2 (ref 2.0–3.0)

## 2021-01-24 ENCOUNTER — Other Ambulatory Visit: Payer: Self-pay | Admitting: Cardiology

## 2021-01-24 ENCOUNTER — Other Ambulatory Visit (INDEPENDENT_AMBULATORY_CARE_PROVIDER_SITE_OTHER): Payer: Self-pay | Admitting: Internal Medicine

## 2021-01-25 ENCOUNTER — Ambulatory Visit (INDEPENDENT_AMBULATORY_CARE_PROVIDER_SITE_OTHER): Payer: Medicare Other | Admitting: *Deleted

## 2021-01-25 ENCOUNTER — Telehealth: Payer: Self-pay | Admitting: Cardiology

## 2021-01-25 ENCOUNTER — Other Ambulatory Visit: Payer: Self-pay

## 2021-01-25 DIAGNOSIS — Z5181 Encounter for therapeutic drug level monitoring: Secondary | ICD-10-CM | POA: Diagnosis not present

## 2021-01-25 DIAGNOSIS — I4891 Unspecified atrial fibrillation: Secondary | ICD-10-CM | POA: Diagnosis not present

## 2021-01-25 MED ORDER — FUROSEMIDE 20 MG PO TABS: ORAL_TABLET | ORAL | 3 refills | Status: DC

## 2021-01-25 NOTE — Telephone Encounter (Signed)
Refilled  lasix 20 mg #270 RF:3 to [place], [place]

## 2021-01-25 NOTE — Telephone Encounter (Signed)
Per Dr Jama Flavors INR is 2.2

## 2021-01-25 NOTE — Telephone Encounter (Signed)
Husband states he has been awaiting Lisa's call since Wednesday and decided to call today to see what was going on. Made him aware Lattie Haw is off today and unsure what happened from Wednesday until today. Please refer to Anticoagulation Encounter.

## 2021-01-25 NOTE — Patient Instructions (Signed)
Description   Spoke to pt's husband (on [place]) and advised to have pt continue taking the dose the pt has been taking, which is, warfarin 1 tablet daily except 1/2 tablet on Tuesdays. Recheck in 4 weeks once return to [place] per husband request.

## 2021-02-04 MED ORDER — METOPROLOL SUCCINATE ER 25 MG PO TB24: ORAL_TABLET | ORAL | 3 refills | Status: DC

## 2021-02-06 ENCOUNTER — Other Ambulatory Visit: Payer: Self-pay | Admitting: Cardiology

## 2021-02-26 ENCOUNTER — Ambulatory Visit (INDEPENDENT_AMBULATORY_CARE_PROVIDER_SITE_OTHER): Payer: Medicare Other | Admitting: *Deleted

## 2021-02-26 ENCOUNTER — Other Ambulatory Visit: Payer: Self-pay

## 2021-02-26 DIAGNOSIS — I4891 Unspecified atrial fibrillation: Secondary | ICD-10-CM

## 2021-02-26 DIAGNOSIS — Z5181 Encounter for therapeutic drug level monitoring: Secondary | ICD-10-CM | POA: Diagnosis not present

## 2021-02-26 LAB — POCT INR: INR: 2.8 (ref 2.0–3.0)

## 2021-02-26 NOTE — Patient Instructions (Signed)
Continue warfarin 1 tablet daily except 1/2 tablet on Saturdays. Recheck in 4 weeks

## 2021-02-28 DIAGNOSIS — Z23 Encounter for immunization: Secondary | ICD-10-CM | POA: Diagnosis not present

## 2021-03-04 DIAGNOSIS — Z961 Presence of intraocular lens: Secondary | ICD-10-CM | POA: Diagnosis not present

## 2021-03-04 DIAGNOSIS — H04123 Dry eye syndrome of bilateral lacrimal glands: Secondary | ICD-10-CM | POA: Diagnosis not present

## 2021-03-04 DIAGNOSIS — H401133 Primary open-angle glaucoma, bilateral, severe stage: Secondary | ICD-10-CM | POA: Diagnosis not present

## 2021-03-04 DIAGNOSIS — H353131 Nonexudative age-related macular degeneration, bilateral, early dry stage: Secondary | ICD-10-CM | POA: Diagnosis not present

## 2021-03-05 DIAGNOSIS — M25551 Pain in right hip: Secondary | ICD-10-CM | POA: Diagnosis not present

## 2021-03-05 DIAGNOSIS — M47816 Spondylosis without myelopathy or radiculopathy, lumbar region: Secondary | ICD-10-CM | POA: Diagnosis not present

## 2021-03-12 ENCOUNTER — Ambulatory Visit (INDEPENDENT_AMBULATORY_CARE_PROVIDER_SITE_OTHER): Payer: Medicare Other | Admitting: Internal Medicine

## 2021-03-12 DIAGNOSIS — M47816 Spondylosis without myelopathy or radiculopathy, lumbar region: Secondary | ICD-10-CM | POA: Diagnosis not present

## 2021-03-13 DIAGNOSIS — N6081 Other benign mammary dysplasias of right breast: Secondary | ICD-10-CM | POA: Diagnosis not present

## 2021-03-13 DIAGNOSIS — Z853 Personal history of malignant neoplasm of breast: Secondary | ICD-10-CM | POA: Diagnosis not present

## 2021-03-14 ENCOUNTER — Ambulatory Visit: Payer: Medicare Other | Admitting: Family Medicine

## 2021-03-15 ENCOUNTER — Other Ambulatory Visit (INDEPENDENT_AMBULATORY_CARE_PROVIDER_SITE_OTHER): Payer: Self-pay | Admitting: Gastroenterology

## 2021-03-18 ENCOUNTER — Other Ambulatory Visit: Payer: Self-pay | Admitting: *Deleted

## 2021-03-18 MED ORDER — FUROSEMIDE 80 MG PO TABS: 80.0000 mg | ORAL_TABLET | Freq: Every day | ORAL | 3 refills | Status: DC

## 2021-03-19 ENCOUNTER — Other Ambulatory Visit: Payer: Self-pay

## 2021-03-19 ENCOUNTER — Ambulatory Visit (INDEPENDENT_AMBULATORY_CARE_PROVIDER_SITE_OTHER): Payer: Medicare Other | Admitting: Cardiology

## 2021-03-19 ENCOUNTER — Encounter: Payer: Self-pay | Admitting: Cardiology

## 2021-03-19 VITALS — BP 122/64 | HR 52 | Ht 59.0 in | Wt 115.0 lb

## 2021-03-19 DIAGNOSIS — I5022 Chronic systolic (congestive) heart failure: Secondary | ICD-10-CM | POA: Diagnosis not present

## 2021-03-19 DIAGNOSIS — I4891 Unspecified atrial fibrillation: Secondary | ICD-10-CM

## 2021-03-19 DIAGNOSIS — Z79899 Other long term (current) drug therapy: Secondary | ICD-10-CM

## 2021-03-19 DIAGNOSIS — I340 Nonrheumatic mitral (valve) insufficiency: Secondary | ICD-10-CM | POA: Diagnosis not present

## 2021-03-19 DIAGNOSIS — I10 Essential (primary) hypertension: Secondary | ICD-10-CM | POA: Diagnosis not present

## 2021-03-19 NOTE — Progress Notes (Signed)
Clinical Summary Ms. Steingraber is a 87 y.o.female seen today for follow up of the following medical problems.    1. Chronic systolic heart failure - possibly chemo induced, secondary to Adriamycin.  - echo 01/2016 LVEF 40-45%, mild to moderate AI, mild to moderate MR, PASP 42 - previous radiation treatments, previous CXRs have shown some scarring and COPD changes however had normal PFTs   09/2018 echo LVEF 35-40%, diffuse hypokinesis, moderate MR, mild AI -issues with entresto and costthrough there insurance, back on ARB.Prior Low heart rates, orthostatic symptoms have limited medical therapy.    10/2020 echo LVEF 123456, indet diastolic fxn, severe BAE, mod MR, mild AI - some ongoing edema at times. Taking lasix 100mg  alteranting days 80mg , home weights stable 112-115 lbs.   2.Valvular heart disease -mild AI, mod MR by last echo  01/2020 echo LVEF 40%, mod MR, mild to mod AI 10/2020 echo LVEF 123456, indet diastolic fxn, severe BAE, mod MR   3. HTN   -remains compliant with meds  4. Paroxysmal afib  - has not been interested in NOACs, remains on coumadin   -no bleeding on coumadin - no significant palpitations    5. Breast CA - followed by oncology   6. Chronic pneumonitis - related to prior cancer treatments.     Travels regularly to [place] and [place] with her husband, he is a retired Software engineer.    Past Medical History:  Diagnosis Date  . Adenocarcinoma, breast ([place])    treated with lumpectomy, node dissection, chemo and RT  . Ataxia   . Breast cancer ([place]) 2009   IDC+DCIS of right breast; triple positive  . Cardiomyopathy 2009   possibly secondary to Adriamycin; EF 40% in 12/09 and 50% 3/10; pulmonary edema in 2009  . Chronic anticoagulation 04/16/2011  . Chronic kidney disease (CKD), stage I    when on diuretic with creatinine of 1.6  . Complication of anesthesia    patient vomits with demerol  .  DJD (degenerative joint disease)    right knee and lumbosacral spine  . Hearing impairment   . History of angina 2001   normal cornary arteries in 2001  . Hypertension   . Macular degeneration, dry    + cataracts  . Neuropathy    fingers and toes  . Permanent atrial fibrillation ([place])   . PONV (postoperative nausea and vomiting)   . Retinal macular atrophy   . Sick sinus syndrome ([facility])    Paroxysmal to permanent AF; initially first degree AV block also noted     Allergies  Allergen Reactions  . Altace [Ramipril] Rash     Current Outpatient Medications  Medication Sig Dispense Refill  . cholecalciferol (VITAMIN D) 1000 UNITS tablet Take 1,000 Units by mouth daily.      Marland Kitchen dicyclomine (BENTYL) 10 MG capsule TAKE (1) CAPSULE BY MOUTH TWICE DAILY. 180 capsule 0  . dorzolamide (TRUSOPT) 2 % ophthalmic solution Place 1 drop into both eyes 2 (two) times daily.     . furosemide (LASIX) 20 MG tablet TAKE 3 TABLETS BY MOUTH DAILY EACH MORNING OR AS NEEDED 270 tablet 3  . furosemide (LASIX) 80 MG tablet Take 1 tablet (80 mg total) by mouth daily. 90 tablet 3  . hyoscyamine (LEVSIN SL) 0.125 MG SL tablet DISSOLVE 1 TABLET UNDER THE TONGUE TWICE A DAY AS NEEDED 180 tablet 0  . latanoprost (XALATAN) 0.005 % ophthalmic solution Place 1 drop into both eyes at bedtime.  3  . loperamide (IMODIUM A-D) 2 MG tablet TAKE ONE TABLET BY MOUTH TWICE DAILY AS NEEDED FOR DIARRHEA OR LOOSE STOOLS 200 tablet 0  . metoprolol succinate (TOPROL-XL) 25 MG 24 hr tablet TAKE 1 TABLET IN ADDITION TO 50 MG TABLETS. 90 tablet 3  . metoprolol succinate (TOPROL-XL) 50 MG 24 hr tablet Take 1 tablet (50 mg total) by mouth daily. Take with or immediately following a meal. 90 tablet 1  . ondansetron (ZOFRAN) 8 MG tablet Take 1 tablet (8 mg total) by mouth every 8 (eight) hours as needed for nausea or vomiting. 30 tablet 1  . potassium chloride (KLOR-CON) 20 MEQ packet TAKE 2 PACKETS (40 MEQ) BY MOUTH DAILY. 90 packet 3  .  traMADol (ULTRAM) 50 MG tablet TAKE 1/2 TABLET BY MOUTH TWICE DAILY AS NEEDED FOR PAIN. ONLY TAKE IF NECESSARY. 30 tablet 0  . valsartan (DIOVAN) 80 MG tablet Take 1 tablet (80 mg total) by mouth daily. 90 tablet 3  . vitamin B-12 (CYANOCOBALAMIN) 1000 MCG tablet Take 1,000 mcg by mouth daily.    Marland Kitchen warfarin (COUMADIN) 5 MG tablet TAKE ONE TABLET BY MOUTH ONCE DAILY. EXCEPT 1/2 TABLET ON MONDAYS. 90 tablet 3   No current facility-administered medications for this visit.     Past Surgical History:  Procedure Laterality Date  . APPENDECTOMY    . CATARACT EXTRACTION W/PHACO  10/11/2012   Procedure: CATARACT EXTRACTION PHACO AND INTRAOCULAR LENS PLACEMENT (IOC);  Surgeon: Williams Che, MD;  Location: [facility];  Service: Ophthalmology;  Laterality: Right;  CDE:10.82  . COLONOSCOPY  02/11/2012   Colonoscopy plus polypectomy x2, Rehman  . COLONOSCOPY N/A 03/30/2017   Procedure: COLONOSCOPY;  Surgeon: Rogene Houston, MD;  Location: [facility];  Service: Endoscopy;  Laterality: N/A;  830  . DILATION AND CURETTAGE OF UTERUS    . ESOPHAGEAL DILATION N/A 01/19/2015   Procedure: ESOPHAGEAL DILATION;  Surgeon: Rogene Houston, MD;  Location: [facility];  Service: Endoscopy;  Laterality: N/A;  . ESOPHAGOGASTRODUODENOSCOPY N/A 01/19/2015   Procedure: ESOPHAGOGASTRODUODENOSCOPY (EGD);  Surgeon: Rogene Houston, MD;  Location: [facility];  Service: Endoscopy;  Laterality: N/A;  910  . MASTECTOMY PARTIAL / LUMPECTOMY W/ AXILLARY LYMPHADENECTOMY  2009   Carcinoma of the breast  . MICRODISCECTOMY LUMBAR  2009   lumbosacral spine  . MIDDLE EAR SURGERY     Left  . POLYPECTOMY  03/30/2017   Procedure: POLYPECTOMY;  Surgeon: Rogene Houston, MD;  Location: [facility];  Service: Endoscopy;;  colon  . PORT-A-CATH REMOVAL    . TONSILLECTOMY       Allergies  Allergen Reactions  . Altace [Ramipril] Rash      Family History  Problem Relation Age of Onset  . Heart failure Mother   . Bladder  Cancer Maternal Uncle        smoker  . Ovarian cancer Maternal Grandmother        dx. 37s; +surgery  . Stroke Brother   . Breast cancer Maternal Aunt        dx. late 97s  . Breast cancer Cousin 3  . Prostate cancer Cousin 97  . Cancer Cousin        unspecified type  . Stroke Paternal Uncle      Social History Ms. Barillas reports that she quit smoking about 65 years ago. Her smoking use included cigarettes. She started smoking about 66 years ago. She has never used smokeless tobacco. Ms. Grimm reports  current alcohol use.   Review of Systems CONSTITUTIONAL: No weight loss, fever, chills, weakness or fatigue.  HEENT: Eyes: No visual loss, blurred vision, double vision or yellow sclerae.No hearing loss, sneezing, congestion, runny nose or sore throat.  SKIN: No rash or itching.  CARDIOVASCULAR: per hpi RESPIRATORY: per hpi GASTROINTESTINAL: No anorexia, nausea, vomiting or diarrhea. No abdominal pain or blood.  GENITOURINARY: No burning on urination, no polyuria NEUROLOGICAL: No headache, dizziness, syncope, paralysis, ataxia, numbness or tingling in the extremities. No change in bowel or bladder control.  MUSCULOSKELETAL: No muscle, back pain, joint pain or stiffness.  LYMPHATICS: No enlarged nodes. No history of splenectomy.  PSYCHIATRIC: No history of depression or anxiety.  ENDOCRINOLOGIC: No reports of sweating, cold or heat intolerance. No polyuria or polydipsia.  Marland Kitchen   Physical Examination Today's Vitals   03/19/21 1140  BP: 122/64  Pulse: (!) 52  SpO2: 98%  Weight: 115 lb (52.2 kg)  Height: 4\' 11"  (1.499 m)   Body mass index is 23.23 kg/m.  Gen: resting comfortably, no acute distress HEENT: no scleral icterus, pupils equal round and reactive, no palptable cervical adenopathy,  CV: irreg, 2/6 systolic murmur apex, no jvd Resp: Clear to auscultation bilaterally GI: abdomen is soft, non-tender, non-distended, normal bowel sounds, no hepatosplenomegaly MSK:  extremities are warm, no edema.  Skin: warm, no rash Neuro:  no focal deficits Psych: appropriate affect   Diagnostic Studies  12/2013 Echo LVEF 45-50%, elevated LA pressure, mild AI, moderate MR, mod TR, PASP 46  10/2014 Echo Study Conclusions  - Left ventricle: The cavity size was normal. Wall thickness was normal. Systolic function was mildly to moderately reduced. The estimated ejection fraction was in the range of 40% to 45%. - Aortic valve: There was mild regurgitation. Regurgitation pressure half-time: 794 ms. - Mitral valve: Mildly calcified annulus. There was mild to moderate regurgitation. MR vena contracta is 0.4 cm. - Left atrium: The atrium was severely dilated. - Right ventricle: The interventricular septum is flattened in systole and diastole, consistent with RV pressure and volume overload. The cavity size was mildly dilated. Systolic function was low normal. RV TAPSE is 1.6 cm. - Right atrium: The atrium was severely dilated. - Atrial septum: No defect or patent foramen ovale was identified. - Pulmonary arteries: Systolic pressure was moderately increased. PA peak pressure: 60 mm Hg (S). - Technically adequate study.   01/2016 echo Study Conclusions  - Left ventricle: The cavity size was normal. Wall thickness was  normal. Systolic function was mildly to moderately reduced. The  estimated ejection fraction was in the range of 40% to 45%.  Diffuse hypokinesis. - Aortic valve: Mildly calcified annulus. Trileaflet; mildly  thickened leaflets. There was mild to moderate regurgitation.  Valve area (VTI): 2.35 cm^2. Valve area (Vmax): 2.4 cm^2. Valve  area (Vmean): 2.6 cm^2. Regurgitation pressure half-time: 690 ms. - Mitral valve: Mildly calcified annulus. Mildly thickened leaflets  . There was mild to moderate regurgitation. - Left atrium: The atrium was severely dilated. - Right atrium: The atrium was severely dilated. - Pulmonary  arteries: Systolic pressure was moderately increased.  PA peak pressure: 42 mm Hg (S). - Technically adequate study.  01/2020 echo IMPRESSIONS    1. Left ventricular ejection fraction, by estimation, is 40%. The left  ventricle has mildly decreased function. The left ventricle demonstrates  global hypokinesis. There is mild left ventricular hypertrophy. Left  ventricular diastolic parameters are  consistent with Grade II diastolic dysfunction (pseudonormalization).  Elevated left atrial pressure.  2. Right ventricular systolic function is low normal. The right  ventricular size is normal. There is moderately elevated pulmonary artery  systolic pressure.  3. Left atrial size was severely dilated.  4. Right atrial size was moderately dilated.  5. The MR is eccentric and posteriorally directed. May lead to  underestimation, the color jet itself is not that impressive due to the  eccentric course of the jet. The MV to AV VTI ratio is 1, suggesting  moderate MR.. The mitral valve is abnormal.  Moderate mitral valve regurgitation. No evidence of mitral stenosis.  6. Tricuspid valve regurgitation is mild to moderate.  7. The aortic valve is tricuspid. Aortic valve regurgitation is mild to  moderate. No aortic stenosis is present.  8. The inferior vena cava is normal in size with greater than 50%  respiratory variability, suggesting right atrial pressure of 3 mmHg.   10/2020 echo IMPRESSIONS    1. Left ventricular ejection fraction, by estimation, is 35 to 40%. The  left ventricle has moderately decreased function. The left ventricle  demonstrates global hypokinesis. Left ventricular diastolic parameters are  indeterminate.  2. Right ventricular systolic function is low normal. The right  ventricular size is normal. There is mildly elevated pulmonary artery  systolic pressure. The estimated right ventricular systolic pressure is  94.7 mmHg.  3. Left atrial size was  severely dilated.  4. Right atrial size was severely dilated.  5. There is a trivial pericardial effusion posterior to the left  ventricle.  6. The mitral valve is abnormal. Moderate, posteriorly directed mitral  valve regurgitation.  7. The aortic valve is tricuspid. Aortic valve regurgitation is mild.  Mild aortic valve sclerosis is present, with no evidence of aortic valve  stenosis. Aortic regurgitation PHT measures 658 msec.  8. The inferior vena cava is dilated in size with <50% respiratory  variability, suggesting right atrial pressure of 15 mmHg.      Assessment and Plan  1. Chronic systolic HF -weights stable at home 112-115 lbs, on lasix 100mg  alternating days with 80mg  - we will update labs with prior diuretic change - in general medical therapy has been limited by orthostatic symptoms. We did discuss SGLT2i's today, family to look into costs. Previously entresto was too expensive, back on ARB   2. Mitral regurgitation - moderate by last echo, continue to monitor   3. HTN  - bp at goal, continue current mds  4. Paroxysmal afib  - not interested in NOACs, continue coumadin. - doing well without symptoms, continue beta blocker   F/u 6 months       Arnoldo Lenis, M.D

## 2021-03-19 NOTE — Patient Instructions (Addendum)
Medication Instructions:  Your physician recommends that you continue on your current medications as directed. Please refer to the Current Medication list given to you today.  *If you need a refill on your cardiac medications before your next appointment, please call your pharmacy*   Lab Work: CBC CMET TSH A1C LIPID- FASTING MAGNESIUM If you have labs (blood work) drawn today and your tests are completely normal, you will receive your results only by: Marland Kitchen MyChart Message (if you have MyChart) OR . A paper copy in the mail If you have any lab test that is abnormal or we need to change your treatment, we will call you to review the results.   Testing/Procedures: None   Follow-Up: At [facility], you and your health needs are our priority.  As part of our continuing mission to provide you with exceptional heart care, we have created designated Provider Care Teams.  These Care Teams include your primary Cardiologist (physician) and Advanced Practice Providers (APPs -  Physician Assistants and Nurse Practitioners) who all work together to provide you with the care you need, when you need it.  We recommend signing up for the patient portal called "MyChart".  Sign up information is provided on this After Visit Summary.  MyChart is used to connect with patients for Virtual Visits (Telemedicine).  Patients are able to view lab/test results, encounter notes, upcoming appointments, etc.  Non-urgent messages can be sent to your provider as well.   To learn more about what you can do with MyChart, go to [email or web address].    Your next appointment:   6 month(s)  The format for your next appointment:   In Person  Provider:   Carlyle Dolly, MD   Other Instructions

## 2021-03-21 ENCOUNTER — Other Ambulatory Visit: Payer: Self-pay | Admitting: Cardiology

## 2021-03-21 DIAGNOSIS — Z79899 Other long term (current) drug therapy: Secondary | ICD-10-CM | POA: Diagnosis not present

## 2021-03-22 DIAGNOSIS — M47816 Spondylosis without myelopathy or radiculopathy, lumbar region: Secondary | ICD-10-CM | POA: Diagnosis not present

## 2021-03-22 LAB — COMPLETE METABOLIC PANEL WITH GFR
AG Ratio: 1.8 (calc) (ref 1.0–2.5)
ALT: 23 U/L (ref 6–29)
AST: 29 U/L (ref 10–35)
Albumin: 4.1 g/dL (ref 3.6–5.1)
Alkaline phosphatase (APISO): 132 U/L (ref 37–153)
BUN/Creatinine Ratio: 37 (calc) — ABNORMAL HIGH (ref 6–22)
BUN: 43 mg/dL — ABNORMAL HIGH (ref 7–25)
CO2: 29 mmol/L (ref 20–32)
Calcium: 9.1 mg/dL (ref 8.6–10.4)
Chloride: 103 mmol/L (ref 98–110)
Creat: 1.16 mg/dL — ABNORMAL HIGH (ref 0.60–0.88)
GFR, Est African American: 49 mL/min/{1.73_m2} — ABNORMAL LOW (ref >=60)
GFR, Est Non African American: 43 mL/min/{1.73_m2} — ABNORMAL LOW (ref >=60)
Globulin: 2.3 g/dL (calc) (ref 1.9–3.7)
Glucose, Bld: 94 mg/dL (ref 65–99)
Potassium: 3.8 mmol/L (ref 3.5–5.3)
Sodium: 142 mmol/L (ref 135–146)
Total Bilirubin: 1.5 mg/dL — ABNORMAL HIGH (ref 0.2–1.2)
Total Protein: 6.4 g/dL (ref 6.1–8.1)

## 2021-03-22 LAB — CBC
HCT: 40.3 % (ref 35.0–45.0)
Hemoglobin: 13.3 g/dL (ref 11.7–15.5)
MCH: 36.3 pg — ABNORMAL HIGH (ref 27.0–33.0)
MCHC: 33 g/dL (ref 32.0–36.0)
MCV: 110.1 fL — ABNORMAL HIGH (ref 80.0–100.0)
MPV: 11.3 fL (ref 7.5–12.5)
Platelets: 144 10*3/uL (ref 140–400)
RBC: 3.66 10*6/uL — ABNORMAL LOW (ref 3.80–5.10)
RDW: 13 % (ref 11.0–15.0)
WBC: 3.7 10*3/uL — ABNORMAL LOW (ref 3.8–10.8)

## 2021-03-22 LAB — LIPID PANEL
Cholesterol: 168 mg/dL (ref <200)
HDL: 82 mg/dL (ref >=50)
LDL Cholesterol (Calc): 73 mg/dL (calc)
Non-HDL Cholesterol (Calc): 86 mg/dL (calc) (ref <130)
Total CHOL/HDL Ratio: 2 (calc) (ref <5.0)
Triglycerides: 57 mg/dL (ref <150)

## 2021-03-22 LAB — TSH: TSH: 1.58 mIU/L (ref 0.40–4.50)

## 2021-03-22 LAB — HEMOGLOBIN A1C W/OUT EAG: Hgb A1c MFr Bld: 5.1 % of total Hgb (ref <5.7)

## 2021-03-22 LAB — MAGNESIUM: Magnesium: 2.4 mg/dL (ref 1.5–2.5)

## 2021-03-26 ENCOUNTER — Telehealth: Payer: Self-pay

## 2021-03-26 ENCOUNTER — Ambulatory Visit (INDEPENDENT_AMBULATORY_CARE_PROVIDER_SITE_OTHER): Payer: Medicare Other | Admitting: *Deleted

## 2021-03-26 DIAGNOSIS — I4891 Unspecified atrial fibrillation: Secondary | ICD-10-CM

## 2021-03-26 DIAGNOSIS — Z5181 Encounter for therapeutic drug level monitoring: Secondary | ICD-10-CM | POA: Diagnosis not present

## 2021-03-26 LAB — POCT INR: INR: 1.6 — AB (ref 2.0–3.0)

## 2021-03-26 NOTE — Telephone Encounter (Signed)
Pt and spouse notified. Pt voiced understanding.

## 2021-03-26 NOTE — Telephone Encounter (Signed)
-----   Message from Arnoldo Lenis, MD sent at 03/25/2021 12:35 PM EDT ----- Labs overall look fine. Mild kidney dysfunction that is stable from last several checks.   Zandra Abts MD

## 2021-03-26 NOTE — Patient Instructions (Signed)
Take warfarin 1 1/2 tablets tonight and tomorrow night then resume 1 tablet daily except 1/2 tablet on Saturdays. Recheck in 10 days

## 2021-04-01 ENCOUNTER — Other Ambulatory Visit: Payer: Self-pay | Admitting: Cardiology

## 2021-04-02 ENCOUNTER — Other Ambulatory Visit: Payer: Self-pay | Admitting: *Deleted

## 2021-04-02 MED ORDER — METOPROLOL SUCCINATE ER 50 MG PO TB24: 50.0000 mg | ORAL_TABLET | Freq: Every day | ORAL | 2 refills | Status: AC

## 2021-04-04 ENCOUNTER — Ambulatory Visit (INDEPENDENT_AMBULATORY_CARE_PROVIDER_SITE_OTHER): Payer: Medicare Other | Admitting: *Deleted

## 2021-04-04 DIAGNOSIS — Z5181 Encounter for therapeutic drug level monitoring: Secondary | ICD-10-CM

## 2021-04-04 DIAGNOSIS — I4891 Unspecified atrial fibrillation: Secondary | ICD-10-CM

## 2021-04-04 LAB — POCT INR: INR: 2.4 (ref 2.0–3.0)

## 2021-04-04 NOTE — Patient Instructions (Signed)
Continue warfarin 1 tablet daily except 1/2 tablet on Saturdays. Recheck in [place] on 05/01/21. Standing order given to pt. With instructions to fax results to me.

## 2021-04-23 ENCOUNTER — Telehealth: Payer: Self-pay

## 2021-04-23 MED ORDER — VALSARTAN 80 MG PO TABS: 80.0000 mg | ORAL_TABLET | Freq: Every day | ORAL | 3 refills | Status: DC

## 2021-04-23 NOTE — Telephone Encounter (Signed)
Medication refill request for Valsartan 80 mg tabelts approved and sent to [facility] in [place], [place] per the patient request.

## 2021-04-24 ENCOUNTER — Other Ambulatory Visit: Payer: Self-pay | Admitting: *Deleted

## 2021-04-24 MED ORDER — WARFARIN SODIUM 5 MG PO TABS: ORAL_TABLET | ORAL | 1 refills | Status: DC

## 2021-05-03 DIAGNOSIS — I4891 Unspecified atrial fibrillation: Secondary | ICD-10-CM | POA: Diagnosis not present

## 2021-05-03 DIAGNOSIS — Z5181 Encounter for therapeutic drug level monitoring: Secondary | ICD-10-CM | POA: Diagnosis not present

## 2021-05-03 LAB — PROTIME-INR: INR: 2.6 — AB (ref 0.9–1.1)

## 2021-05-07 ENCOUNTER — Ambulatory Visit (INDEPENDENT_AMBULATORY_CARE_PROVIDER_SITE_OTHER): Payer: Medicare Other | Admitting: *Deleted

## 2021-05-07 DIAGNOSIS — Z5181 Encounter for therapeutic drug level monitoring: Secondary | ICD-10-CM

## 2021-05-07 NOTE — Patient Instructions (Signed)
Continue warfarin 1 tablet daily except 1/2 tablet on Saturdays. Recheck in [place] on 05/01/21. Standing order given to pt. With instructions to fax results to me. Pt will be coming back home 7/6.  Spouse will call for INR appt when they get back.

## 2021-05-13 ENCOUNTER — Telehealth: Payer: Self-pay

## 2021-05-13 MED ORDER — METOPROLOL SUCCINATE ER 25 MG PO TB24: ORAL_TABLET | ORAL | 3 refills | Status: DC

## 2021-05-13 NOTE — Telephone Encounter (Signed)
Medication refill request for Metoprolol 25 mg tablets approved and sent to requested pharmacy.      Please call in a new prescription for Metoprolol 25 for Alicia Harrington (DOB 09/04/2032) to [facility], [address], [place] [address]. [phone].  Thanks,   Dr. Heide Spark

## 2021-06-04 ENCOUNTER — Ambulatory Visit (INDEPENDENT_AMBULATORY_CARE_PROVIDER_SITE_OTHER): Payer: Medicare Other | Admitting: *Deleted

## 2021-06-04 ENCOUNTER — Other Ambulatory Visit: Payer: Self-pay

## 2021-06-04 DIAGNOSIS — I4891 Unspecified atrial fibrillation: Secondary | ICD-10-CM | POA: Diagnosis not present

## 2021-06-04 DIAGNOSIS — Z5181 Encounter for therapeutic drug level monitoring: Secondary | ICD-10-CM | POA: Diagnosis not present

## 2021-06-04 LAB — POCT INR: INR: 2.7 (ref 2.0–3.0)

## 2021-06-04 NOTE — Patient Instructions (Signed)
Continue warfarin 1 tablet daily except 1/2 tablet on Saturdays.  Recheck in 4 wks

## 2021-06-10 DIAGNOSIS — H6123 Impacted cerumen, bilateral: Secondary | ICD-10-CM | POA: Diagnosis not present

## 2021-06-13 DIAGNOSIS — M79662 Pain in left lower leg: Secondary | ICD-10-CM | POA: Diagnosis not present

## 2021-06-17 ENCOUNTER — Telehealth (INDEPENDENT_AMBULATORY_CARE_PROVIDER_SITE_OTHER): Payer: Self-pay

## 2021-06-17 DIAGNOSIS — K580 Irritable bowel syndrome with diarrhea: Secondary | ICD-10-CM

## 2021-06-17 MED ORDER — DICYCLOMINE HCL 10 MG PO CAPS
10.0000 mg | ORAL_CAPSULE | Freq: Three times a day (TID) | ORAL | 3 refills | Status: AC
Start: 2021-06-17 — End: ?

## 2021-06-17 NOTE — Telephone Encounter (Signed)
Patient last seen 01/30/2020 for IBS D. Next appt is scheduled for 07/09/2021 with Dr.Rehman.

## 2021-06-17 NOTE — Telephone Encounter (Signed)
Patient's husband Dr.Woodard called stating his wife needs refills on Bentyl 10 mg one po TID with meals. #270 with 3 refills sent to [facility]. They state they need this today as they are going out of town tomorrow.

## 2021-06-17 NOTE — Telephone Encounter (Signed)
Medication sent to requested pharmacy. Dr. Ellin Mayhew aware.   Per Dr. Jenetta Downer ok to send in refills.

## 2021-06-27 DIAGNOSIS — M25511 Pain in right shoulder: Secondary | ICD-10-CM | POA: Diagnosis not present

## 2021-07-01 NOTE — Telephone Encounter (Signed)
Can we stop her lasix, change her to torsemide '40mg'$  bid x 3 days, then '40mg'$  daily. Please udpate [place] on weights and swelling on Thursday. I would have her drink when thirsty, would not encourage any extra fluid intake  Zandra Abts MD

## 2021-07-02 ENCOUNTER — Ambulatory Visit (INDEPENDENT_AMBULATORY_CARE_PROVIDER_SITE_OTHER): Payer: Medicare Other | Admitting: *Deleted

## 2021-07-02 ENCOUNTER — Other Ambulatory Visit: Payer: Self-pay

## 2021-07-02 DIAGNOSIS — Z5181 Encounter for therapeutic drug level monitoring: Secondary | ICD-10-CM

## 2021-07-02 DIAGNOSIS — H353130 Nonexudative age-related macular degeneration, bilateral, stage unspecified: Secondary | ICD-10-CM | POA: Diagnosis not present

## 2021-07-02 DIAGNOSIS — H401133 Primary open-angle glaucoma, bilateral, severe stage: Secondary | ICD-10-CM | POA: Diagnosis not present

## 2021-07-02 DIAGNOSIS — H04123 Dry eye syndrome of bilateral lacrimal glands: Secondary | ICD-10-CM | POA: Diagnosis not present

## 2021-07-02 DIAGNOSIS — I4891 Unspecified atrial fibrillation: Secondary | ICD-10-CM

## 2021-07-02 DIAGNOSIS — Z961 Presence of intraocular lens: Secondary | ICD-10-CM | POA: Diagnosis not present

## 2021-07-02 LAB — POCT INR: INR: 1.9 — AB (ref 2.0–3.0)

## 2021-07-02 MED ORDER — TORSEMIDE 20 MG PO TABS: 40.0000 mg | ORAL_TABLET | Freq: Every day | ORAL | 3 refills | Status: AC

## 2021-07-02 NOTE — Patient Instructions (Signed)
Starting Torsemide '40mg'$  bid x 3 days then '40mg'$  daily today Continue warfarin 1 tablet daily except 1/2 tablet on Saturdays.  Recheck in 3 wks

## 2021-07-09 ENCOUNTER — Ambulatory Visit (INDEPENDENT_AMBULATORY_CARE_PROVIDER_SITE_OTHER): Payer: Medicare Other | Admitting: Internal Medicine

## 2021-07-09 ENCOUNTER — Other Ambulatory Visit: Payer: Self-pay

## 2021-07-09 ENCOUNTER — Encounter (INDEPENDENT_AMBULATORY_CARE_PROVIDER_SITE_OTHER): Payer: Self-pay | Admitting: Internal Medicine

## 2021-07-09 VITALS — BP 132/87 | HR 53 | Temp 98.7°F | Ht 59.0 in | Wt 116.4 lb

## 2021-07-09 DIAGNOSIS — K7460 Unspecified cirrhosis of liver: Secondary | ICD-10-CM | POA: Diagnosis not present

## 2021-07-09 DIAGNOSIS — K580 Irritable bowel syndrome with diarrhea: Secondary | ICD-10-CM

## 2021-07-09 NOTE — Patient Instructions (Signed)
Decrease dicyclomine dose to twice daily if possible. Can use glycerin and/or Dulcolax suppository prior to leaving house on days when you do not have a spontaneous bowel movement. Physician will call with results of blood test and ultrasound when completed.

## 2021-07-09 NOTE — Progress Notes (Signed)
Presenting complaint;  Follow-up for IBS/diarrhea and cirrhosis.  Database and subjective:  Patient is 87 year old Caucasian female who is here for scheduled visit accompanied by her husband Dr. Heide Spark.  She was last seen in March 2021. She has history of IBS with diarrhea and intermittent urgency with accidents who has been maintained on dicyclomine and as needed loperamide.  She also has a history of cirrhosis diagnosed in March last year.  Work-up was negative.  She was felt to have cirrhosis secondary to chemotherapy for breast carcinoma.  Her liver function has been well-preserved.  She has not had imaging in more than a year.  She takes dicyclomine 3 times a day.  She is not having any side effects.  She has had dry mouth and she has learned to live with it.  She is still having 2-3 accidents per month.  When she goes to either restaurant or leave the house he usually uses depends.  Last episode occurred in the afternoon after she had had lunch when she was away from home.  She rarely has constipation.  She is using Imodium on as-needed basis and she feels it helps.  On most days she has 1-2 formed stools.  Her appetite is fair.  She states she gained 10 pounds when she went to [place] and she was in all to June of 7000.  She did experience some shortness of breath.  Her diuretic therapy has been changed and she was able to lose 10 pounds in a matter of few days.  Current Medications: Outpatient Encounter Medications as of 07/09/2021  Medication Sig   cholecalciferol (VITAMIN D) 1000 UNITS tablet Take 1,000 Units by mouth daily.     dicyclomine (BENTYL) 10 MG capsule Take 1 capsule (10 mg total) by mouth 3 (three) times daily before meals. prn   dorzolamide (TRUSOPT) 2 % ophthalmic solution Place 1 drop into both eyes 2 (two) times daily.    latanoprost (XALATAN) 0.005 % ophthalmic solution Place 1 drop into both eyes at bedtime.    loperamide (IMODIUM A-D) 2 MG tablet TAKE ONE TABLET BY  MOUTH TWICE DAILY AS NEEDED FOR DIARRHEA OR LOOSE STOOLS   metoprolol succinate (TOPROL-XL) 25 MG 24 hr tablet TAKE 1 TABLET IN ADDITION TO 50 MG TABLETS.   metoprolol succinate (TOPROL-XL) 50 MG 24 hr tablet Take 1 tablet (50 mg total) by mouth daily. Take with or immediately following a meal.   ondansetron (ZOFRAN) 8 MG tablet Take 1 tablet (8 mg total) by mouth every 8 (eight) hours as needed for nausea or vomiting.   potassium chloride (KLOR-CON) 20 MEQ packet TAKE 2 PACKETS (40 MEQ) BY MOUTH DAILY. (Patient taking differently: Take 20 mEq by mouth daily.)   torsemide (DEMADEX) 20 MG tablet Take 2 tablets (40 mg total) by mouth daily.   traMADol (ULTRAM) 50 MG tablet TAKE 1/2 TABLET BY MOUTH TWICE DAILY AS NEEDED FOR PAIN. ONLY TAKE IF NECESSARY.   valsartan (DIOVAN) 80 MG tablet Take 1 tablet (80 mg total) by mouth daily.   vitamin B-12 (CYANOCOBALAMIN) 1000 MCG tablet Take 1,000 mcg by mouth daily.   warfarin (COUMADIN) 5 MG tablet TAKE ONE TABLET BY MOUTH ONCE DAILY EXCEPT 1/2 TABLET ON SATURDAYS OR AS DIRECTED   No facility-administered encounter medications on file as of 07/09/2021.     Objective: Blood pressure 132/87, pulse (!) 53, temperature 98.7 F (37.1 C), temperature source Oral, height 4' 11"  (1.499 m), weight 116 lb 6.4 oz (52.8 kg). Patient is  alert and in no acute distress. She does not have asterixis. Conjunctiva is pink. Sclera is nonicteric Oropharyngeal mucosa is normal. No neck masses or thyromegaly noted. Cardiac exam with regular rhythm normal S1 and S2. No murmur or gallop noted. Lungs are clear to auscultation. Abdomen is symmetrical soft and nontender with organomegaly or masses. She has trace edema around ankles.  She has stasis dermatitis changes to skin of left leg.  She also has area of below left knee anteriorly where she had ulceration which has healed but is still somewhat red.  Labs/studies Results:   CBC Latest Ref Rng & Units 03/21/2021 01/14/2016  08/08/2015  WBC 3.8 - 10.8 Thousand/uL 3.7(L) 3.4(L) 4.3  Hemoglobin 11.7 - 15.5 g/dL 13.3 13.3 13.6  Hematocrit 35.0 - 45.0 % 40.3 39.0 39.8  Platelets 140 - 400 Thousand/uL 144 147(L) 127(L)    CMP Latest Ref Rng & Units 03/21/2021 11/26/2020 11/01/2020  Glucose 65 - 99 mg/dL 94 92 -  BUN 7 - 25 mg/dL 43(H) 33(H) -  Creatinine 0.60 - 0.88 mg/dL 1.16(H) 1.12(H) -  Sodium 135 - 146 mmol/L 142 145 -  Potassium 3.5 - 5.3 mmol/L 3.8 3.5 -  Chloride 98 - 110 mmol/L 103 106 -  CO2 20 - 32 mmol/L 29 29 -  Calcium 8.6 - 10.4 mg/dL 9.1 9.6 -  Total Protein 6.1 - 8.1 g/dL 6.4 - 6.4  Total Bilirubin 0.2 - 1.2 mg/dL 1.5(H) - 1.3(H)  Alkaline Phos 38 - 126 U/L - - -  AST 10 - 35 U/L 29 - 27  ALT 6 - 29 U/L 23 - 23    Hepatic Function Latest Ref Rng & Units 03/21/2021 11/01/2020 01/14/2016  Total Protein 6.1 - 8.1 g/dL 6.4 6.4 6.6  Albumin 3.5 - 5.0 g/dL - - 4.1  AST 10 - 35 U/L 29 27 37  ALT 6 - 29 U/L 23 23 25   Alk Phosphatase 38 - 126 U/L - - 155(H)  Total Bilirubin 0.2 - 1.2 mg/dL 1.5(H) 1.3(H) 1.3(H)  Bilirubin, Direct 0.0 - 0.2 mg/dL - 0.3(H) -      Assessment:  #1.  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  All biochemical markers are negative.  She remains with well-preserved hepatic function.  She is due for [facility] screening.  #2.  Irritable bowel syndrome with diarrhea and urgency.  She is on dicyclomine 10 mg 3 times a day and appears to be tolerating well.  She is still requiring loperamide as needed.  I would like for her to decrease dicyclomine dose to 20 mg a day if at all possible.  #3.  Elevated BUN and creatinine most likely secondary to diuretic therapy.   Plan:  Patient will go to lab for metabolic 7 and alpha-fetoprotein. Abdominal ultrasound. Patient advised to use glycerin or Dulcolax suppository on days when she has to leave home but has not had spontaneous bowel movement. Office visit in April 2023.

## 2021-07-10 LAB — BASIC METABOLIC PANEL
BUN/Creatinine Ratio: 33 (calc) — ABNORMAL HIGH (ref 6–22)
BUN: 39 mg/dL — ABNORMAL HIGH (ref 7–25)
CO2: 29 mmol/L (ref 20–32)
Calcium: 9.3 mg/dL (ref 8.6–10.4)
Chloride: 104 mmol/L (ref 98–110)
Creat: 1.2 mg/dL — ABNORMAL HIGH (ref 0.60–0.95)
Glucose, Bld: 105 mg/dL (ref 65–139)
Potassium: 3 mmol/L — ABNORMAL LOW (ref 3.5–5.3)
Sodium: 146 mmol/L (ref 135–146)

## 2021-07-10 LAB — AFP TUMOR MARKER: AFP-Tumor Marker: 15.9 ng/mL — ABNORMAL HIGH

## 2021-07-11 ENCOUNTER — Other Ambulatory Visit (INDEPENDENT_AMBULATORY_CARE_PROVIDER_SITE_OTHER): Payer: Self-pay | Admitting: Internal Medicine

## 2021-07-11 DIAGNOSIS — E876 Hypokalemia: Secondary | ICD-10-CM

## 2021-07-11 MED ORDER — POTASSIUM CHLORIDE CRYS ER 20 MEQ PO TBCR: 20.0000 meq | EXTENDED_RELEASE_TABLET | Freq: Two times a day (BID) | ORAL | 2 refills | Status: DC

## 2021-07-17 ENCOUNTER — Ambulatory Visit (HOSPITAL_COMMUNITY)
Admission: RE | Admit: 2021-07-17 | Discharge: 2021-07-17 | Disposition: A | Discharge status: Home / Self Care | Payer: Medicare Other | Source: Ambulatory Visit | Attending: Internal Medicine | Admitting: Internal Medicine

## 2021-07-17 ENCOUNTER — Other Ambulatory Visit: Payer: Self-pay

## 2021-07-17 DIAGNOSIS — N281 Cyst of kidney, acquired: Secondary | ICD-10-CM | POA: Diagnosis not present

## 2021-07-17 DIAGNOSIS — K7460 Unspecified cirrhosis of liver: Secondary | ICD-10-CM | POA: Diagnosis not present

## 2021-07-17 DIAGNOSIS — K7689 Other specified diseases of liver: Secondary | ICD-10-CM | POA: Diagnosis not present

## 2021-07-23 ENCOUNTER — Ambulatory Visit (INDEPENDENT_AMBULATORY_CARE_PROVIDER_SITE_OTHER): Payer: Medicare Other | Admitting: *Deleted

## 2021-07-23 ENCOUNTER — Other Ambulatory Visit: Payer: Self-pay

## 2021-07-23 DIAGNOSIS — I4891 Unspecified atrial fibrillation: Secondary | ICD-10-CM

## 2021-07-23 DIAGNOSIS — Z5181 Encounter for therapeutic drug level monitoring: Secondary | ICD-10-CM

## 2021-07-23 DIAGNOSIS — Z7901 Long term (current) use of anticoagulants: Secondary | ICD-10-CM

## 2021-07-23 LAB — POCT INR: INR: 3.8 — AB (ref 2.0–3.0)

## 2021-07-23 NOTE — Patient Instructions (Addendum)
On torsemide '40mg'$  daily  Hold warfarin tonight then resume 1 tablet daily except 1/2 tablet on Saturdays.  Recheck in 2 wks

## 2021-07-30 ENCOUNTER — Telehealth: Payer: Self-pay

## 2021-07-30 MED ORDER — VALSARTAN 80 MG PO TABS: 80.0000 mg | ORAL_TABLET | Freq: Every day | ORAL | 3 refills | Status: DC

## 2021-07-30 MED ORDER — WARFARIN SODIUM 5 MG PO TABS: ORAL_TABLET | ORAL | 3 refills | Status: AC

## 2021-07-30 NOTE — Telephone Encounter (Signed)
Medication refill request for Valsartan 80 mg tablets approved and sent to [facility] per pt request

## 2021-07-30 NOTE — Telephone Encounter (Signed)
Please call in a prescription to [facility] in [place] for [employer] (DOB 04-09-2032) for Valsartan 80 mg #90 with 3 refills.  Directions are the same - Take one pill by mouth every day. We are out after this week. Thanks,   Viviano Simas Spouse

## 2021-08-02 DIAGNOSIS — Z23 Encounter for immunization: Secondary | ICD-10-CM | POA: Diagnosis not present

## 2021-08-05 ENCOUNTER — Ambulatory Visit (INDEPENDENT_AMBULATORY_CARE_PROVIDER_SITE_OTHER): Payer: Medicare Other | Admitting: *Deleted

## 2021-08-05 DIAGNOSIS — Z5181 Encounter for therapeutic drug level monitoring: Secondary | ICD-10-CM | POA: Diagnosis not present

## 2021-08-05 DIAGNOSIS — I4891 Unspecified atrial fibrillation: Secondary | ICD-10-CM

## 2021-08-05 LAB — POCT INR: INR: 2 (ref 2.0–3.0)

## 2021-08-05 NOTE — Patient Instructions (Signed)
Continue warfarin 1 tablet daily except 1/2 tablet on Saturdays.  Recheck in 4 wks in [facility]

## 2021-08-21 ENCOUNTER — Ambulatory Visit (INDEPENDENT_AMBULATORY_CARE_PROVIDER_SITE_OTHER): Payer: Medicare Other | Admitting: *Deleted

## 2021-08-21 DIAGNOSIS — I4891 Unspecified atrial fibrillation: Secondary | ICD-10-CM | POA: Diagnosis not present

## 2021-08-21 DIAGNOSIS — Z5181 Encounter for therapeutic drug level monitoring: Secondary | ICD-10-CM | POA: Diagnosis not present

## 2021-08-21 LAB — POCT INR: INR: 1.9 — AB (ref 2.0–3.0)

## 2021-08-21 NOTE — Patient Instructions (Signed)
Increase warfarin to 1 tablet daily  Recheck in 4 wks in [facility]

## 2021-09-19 ENCOUNTER — Telehealth: Payer: Self-pay | Admitting: *Deleted

## 2021-09-19 NOTE — Telephone Encounter (Signed)
Pt due to have her INR checked today per the coumadin clinic follow up book. Results were to be faxed from [facility].Called and spoke to pt's husband who stated that they were home now and could come in on Monday 10/31 to have INR checked- appointment made.

## 2021-09-23 ENCOUNTER — Other Ambulatory Visit: Payer: Self-pay

## 2021-09-23 ENCOUNTER — Ambulatory Visit (INDEPENDENT_AMBULATORY_CARE_PROVIDER_SITE_OTHER): Payer: Medicare Other | Admitting: *Deleted

## 2021-09-23 ENCOUNTER — Encounter: Payer: Self-pay | Admitting: Neurology

## 2021-09-23 DIAGNOSIS — I4891 Unspecified atrial fibrillation: Secondary | ICD-10-CM | POA: Diagnosis not present

## 2021-09-23 DIAGNOSIS — Z5181 Encounter for therapeutic drug level monitoring: Secondary | ICD-10-CM

## 2021-09-23 LAB — POCT INR: INR: 4 — AB (ref 2.0–3.0)

## 2021-09-23 NOTE — Patient Instructions (Signed)
Hold warfarin tonight then decrease dose to 1 tablet daily except 1/2 tablet on Tuesdays Recheck in 2 weeks

## 2021-10-03 NOTE — Progress Notes (Signed)
Assessment/Plan:    Essential Tremor.  - We discussed nature and pathophysiology.  We discussed that this can continue to gradually get worse with time.  We discussed that some medications can worsen this, as can caffeine use.  We discussed medication therapy as well as surgical therapy.  Ultimately, the patient decided to hold off on any meds for now. -reassured patient/husband today that there was no evidence of Parkinsons Disease.  Both were relieved.   2.    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  This is known to the patient.  Discussed that balance therapy can help this.  Can let me know if needs/would like a referral  -in addition to the above, it is believed that ET alone can cause ataxia/balance changes due to changes in the cerebellar purkinje cell/climbing fiber synaptic transmission.    Subjective:   Alicia Harrington was seen today in the movement disorders clinic for neurologic consultation at the request of Newman Pies, MD.  The consultation is for the evaluation of tremor.  Outside records that were made available to me were reviewed.  Husband requested referral due to tremor.  Patient is an 87 year old female with history of IBS with diarrhea, cirrhosis (felt secondary possibly to chemo from breast carcinoma), atrial fibrillation (on Coumadin) and chronic pneumonitis who presents today to discuss tremor.  Tremor: Yes.     How long has it been going on? 6-7 months  At rest or with activation?  activation  When is it noted the most?  when gets a bottle/cup toward the mouth  Located where?  Bilateral UE - pt attributes to dehydration - but is choosing the L hand now to eat  Affected by caffeine:  doesn't drink any  Affected by alcohol:  yes, but attributes to being already relaxed   Affected by stress:  No.  Spills soup if on spoon:  No. But it would shake  Affects ADL's (tying shoes, brushing teeth, etc):  No., but would often choose to use the L  hand  Other Specific Symptoms:  Voice: weaker/raspy Sleep: sleeps well  Vivid Dreams:  No.  Acting out dreams:  No. (Rare scream per husband) Wet Pillows: No. Postural symptoms:  No., husband attributes some of it b/c of because of poor vision  Falls?  Yes.  , last fall in august in [place] and at large elevation and didn't see well and fell; prior fall was a year before that Bradykinesia symptoms: difficulty getting out of a chair and difficulty regaining balance; short stepped Loss of smell:  No. Loss of taste:  No. Urinary Incontinence:  No. Difficulty Swallowing:  Yes.   (Big pills and solids - hx of esoph stretch) Handwriting, micrographia: illegible - some due to eye site, some due to PN from chemo Trouble with ADL's:  No.  Trouble buttoning clothing: Yes.  , thinks due to PN Depression:  No.  Memory changes:  Yes.   (Short term per pts husband) Hallucinations:  No.  visual distortions: No. N/V:  No. Lightheaded:  No.  Syncope: possibly over a year ago Diplopia:  No. Dyskinesia:  No.  Neuroimaging of the brain has not previously been performed that is available to me.  Her husband reports that she had 1 done in the last few years that was a CT brain and reported just atrophy.  States that she cannot have an MRI of the brain because of a device in the ear.     ALLERGIES:  Allergies  Allergen Reactions   Altace [Ramipril] Rash    CURRENT MEDICATIONS:  Current Outpatient Medications  Medication Instructions   cholecalciferol (VITAMIN D) 1,000 Units, Oral, Daily   dicyclomine (BENTYL) 10 mg, Oral, 3 times daily before meals, prn   dorzolamide (TRUSOPT) 2 % ophthalmic solution 1 drop, Both Eyes, 2 times daily   latanoprost (XALATAN) 0.005 % ophthalmic solution 1 drop, Both Eyes, Daily at bedtime   loperamide (IMODIUM A-D) 2 MG tablet TAKE ONE TABLET BY MOUTH TWICE DAILY AS NEEDED FOR DIARRHEA OR LOOSE STOOLS   metoprolol succinate (TOPROL-XL) 25 MG 24 hr tablet TAKE 1  TABLET IN ADDITION TO 50 MG TABLETS.   metoprolol succinate (TOPROL-XL) 50 mg, Oral, Daily, Take with or immediately following a meal.   ondansetron (ZOFRAN) 8 mg, Oral, Every 8 hours PRN   potassium chloride SA (KLOR-CON) 20 MEQ tablet 20 mEq, Oral, 2 times daily   torsemide (DEMADEX) 40 mg, Oral, Daily   traMADol (ULTRAM) 50 MG tablet TAKE 1/2 TABLET BY MOUTH TWICE DAILY AS NEEDED FOR PAIN. ONLY TAKE IF NECESSARY.   valsartan (DIOVAN) 80 mg, Oral, Daily   vitamin B-12 (CYANOCOBALAMIN) 1,000 mcg, Oral, Daily   warfarin (COUMADIN) 5 MG tablet TAKE ONE TABLET BY MOUTH ONCE DAILY EXCEPT 1/2 TABLET ON SATURDAYS OR AS DIRECTED    Objective:   PHYSICAL EXAMINATION:    VITALS:   Vitals:   10/07/21 1013  BP: 107/70  Pulse: 71  SpO2: 99%  Weight: 116 lb 9.6 oz (52.9 kg)  Height: 4\' 11"  (1.499 m)    GEN:  The patient appears stated age and is in NAD. HEENT:  Normocephalic, atraumatic.  The mucous membranes are moist. The superficial temporal arteries are without ropiness or tenderness. CV:  RRR Lungs:  CTAB Neck/HEME:  There are no carotid bruits bilaterally.  Neurological examination:  Orientation: The patient is alert and oriented x3.  Cranial nerves: There is good facial symmetry.  Extraocular muscles are intact. The visual fields are full to confrontational testing (although she does have some acuity issues, she can count fingers). The speech is fluent and clear. Soft palate rises symmetrically and there is no tongue deviation. Hearing is decreased to conversational tone. Sensation: Sensation is intact to light touch throughout (facial, trunk, extremities). Vibration is decreased distally. There is no extinction with double simultaneous stimulation.  Motor: Strength is 5/5 in the bilateral upper and lower extremities, with the exception of right arm abduction and she has significant difficulty with this because of rotator cuff issues on the right.   Shoulder shrug is equal and  symmetric.  There is no pronator drift. Deep tendon reflexes: Deep tendon reflexes are 1/4 at the bilateral biceps, triceps, brachioradialis, absent at the bilateral patella and achilles. Plantar responses are downgoing bilaterally.  Movement examination: Tone: There is normal tone in the bilateral upper extremities.  The tone in the lower extremities is normal.  Abnormal movements: She has no rest tremor, even with distraction procedures.  She has just mild postural and very mild intention tremor.  She has some mild trouble with Archimedes spirals (some trouble with understanding the task).  Tremor was most obvious when she was asked to pour water from 1 glass to another and the full glass of water was in the right hand.  She then had a moderate degree of tremor. Coordination:  There is no decremation with RAM's, either in the upper or lower extremities bilaterally Gait and Station: The patient pushes off to  arise.  She is just slightly wide-based.  She does have some trouble in the turn.  I have reviewed and interpreted the following labs independently   Chemistry      Component Value Date/Time   NA 146 07/09/2021 1403   K 3.0 (L) 07/09/2021 1403   CL 104 07/09/2021 1403   CO2 29 07/09/2021 1403   BUN 39 (H) 07/09/2021 1403   CREATININE 1.20 (H) 07/09/2021 1403      Component Value Date/Time   CALCIUM 9.3 07/09/2021 1403   ALKPHOS 155 (H) 01/14/2016 1239   AST 29 03/21/2021 0914   ALT 23 03/21/2021 0914   BILITOT 1.5 (H) 03/21/2021 0914      Lab Results  Component Value Date   TSH 1.58 03/21/2021   Lab Results  Component Value Date   WBC 3.7 (L) 03/21/2021   HGB 13.3 03/21/2021   HCT 40.3 03/21/2021   MCV 110.1 (H) 03/21/2021   PLT 144 03/21/2021      Total time spent on today's visit was 45 minutes, including both face-to-face time and nonface-to-face time.  Time included that spent on review of records (prior notes available to me/labs/imaging if pertinent), discussing  treatment and goals, answering patient's questions and coordinating care.  Cc:  [name], MD

## 2021-10-07 ENCOUNTER — Encounter: Payer: Self-pay | Admitting: Cardiology

## 2021-10-07 ENCOUNTER — Other Ambulatory Visit: Payer: Self-pay

## 2021-10-07 ENCOUNTER — Ambulatory Visit (INDEPENDENT_AMBULATORY_CARE_PROVIDER_SITE_OTHER): Payer: Medicare Other | Admitting: Neurology

## 2021-10-07 ENCOUNTER — Encounter: Payer: Self-pay | Admitting: Neurology

## 2021-10-07 ENCOUNTER — Ambulatory Visit (INDEPENDENT_AMBULATORY_CARE_PROVIDER_SITE_OTHER): Payer: Medicare Other | Admitting: Cardiology

## 2021-10-07 ENCOUNTER — Ambulatory Visit (INDEPENDENT_AMBULATORY_CARE_PROVIDER_SITE_OTHER): Payer: Medicare Other | Admitting: *Deleted

## 2021-10-07 VITALS — BP 114/68 | HR 85 | Ht 59.0 in | Wt 116.0 lb

## 2021-10-07 VITALS — BP 107/70 | HR 71 | Ht 59.0 in | Wt 116.6 lb

## 2021-10-07 DIAGNOSIS — G250 Essential tremor: Secondary | ICD-10-CM | POA: Diagnosis not present

## 2021-10-07 DIAGNOSIS — E876 Hypokalemia: Secondary | ICD-10-CM | POA: Diagnosis not present

## 2021-10-07 DIAGNOSIS — I4891 Unspecified atrial fibrillation: Secondary | ICD-10-CM | POA: Diagnosis not present

## 2021-10-07 DIAGNOSIS — I5022 Chronic systolic (congestive) heart failure: Secondary | ICD-10-CM

## 2021-10-07 DIAGNOSIS — I340 Nonrheumatic mitral (valve) insufficiency: Secondary | ICD-10-CM

## 2021-10-07 DIAGNOSIS — I10 Essential (primary) hypertension: Secondary | ICD-10-CM | POA: Diagnosis not present

## 2021-10-07 DIAGNOSIS — G620 Drug-induced polyneuropathy: Secondary | ICD-10-CM

## 2021-10-07 DIAGNOSIS — Z5181 Encounter for therapeutic drug level monitoring: Secondary | ICD-10-CM | POA: Diagnosis not present

## 2021-10-07 LAB — POCT INR: INR: 3.6 — AB (ref 2.0–3.0)

## 2021-10-07 NOTE — Patient Instructions (Signed)
Hold warfarin tonight then decrease dose to 1 tablet daily except 1/2 tablet on Tuesdays and Fridays Recheck in 2 weeks

## 2021-10-07 NOTE — Patient Instructions (Addendum)
Medication Instructions:  Continue all current medications.  Labwork: BMET, BNP, Mg - orders given today. Please do in 2 weeks, around 10/21/2021 Office will contact with results via phone or letter.    Testing/Procedures: Your physician has requested that you have an echocardiogram. Echocardiography is a painless test that uses sound waves to create images of your heart. It provides your doctor with information about the size and shape of your heart and how well your heart's chambers and valves are working. This procedure takes approximately one hour. There are no restrictions for this procedure. Office will contact with results via phone or letter.    Follow-Up: 2 months   Any Other Special Instructions Will Be Listed Below (If Applicable).   If you need a refill on your cardiac medications before your next appointment, please call your pharmacy.

## 2021-10-07 NOTE — Progress Notes (Signed)
Clinical Summary Ms. Blumenstock is a 87 y.o.female seen today for follow up of the following medical problems.        1. Chronic systolic heart failure - possibly chemo induced, secondary to Adriamycin.   - echo 01/2016 LVEF 40-45%, mild to moderate AI, mild to moderate MR, PASP 42  - previous radiation treatments, previous CXRs have shown some scarring and COPD changes however had normal PFTs     09/2018 echo LVEF 35-40%, diffuse hypokinesis, moderate MR, mild AI - issues with entresto and cost through there insurance , back on ARB. Prior Low heart rates, orthostatic symptoms have limited medical therapy.      10/2020 echo LVEF 73-42%, indet diastolic fxn, severe BAE, mod MR, mild AI - some ongoing edema at times. Taking lasix 100mg  alteranting days 80mg , home weights stable 112-115 lbs.  - DOE < 100 feet. Weights are stabe around 111-115 lbs. Chronically mild LE edema - takes 40mg  daily, takes additonal tablet prn as needed - 06/2021 Cr 1.1 - 1.2     2. Valvular heart disease 01/2020 echo LVEF 40%, mod MR, mild to mod AI 10/2020 echo LVEF 87-68%, indet diastolic fxn, severe BAE, mod MR   - reports some progression of her DOE, unclear if could be valve related.    3. HTN     -she is compliant with meds   4. Permanent afib   - has not been interested in NOACs, remains on coumadin   - no recent palpitations.  - no bleeding on coumadin       5. Breast CA - followed by oncology     6. Chronic pneumonitis - related to prior cancer treatments.          Travels regularly to [place] and [place] with her husband, he is a retired Software engineer.    Past Medical History:  Diagnosis Date   Adenocarcinoma, breast ([place])    treated with lumpectomy, node dissection, chemo and RT   Ataxia    Breast cancer ([place]) 2009   IDC+DCIS of right breast; triple positive   Cardiomyopathy 2009   possibly secondary to Adriamycin; EF 40% in 12/09 and 50% 3/10; pulmonary edema in  2009   Chronic anticoagulation 04/16/2011   Chronic kidney disease (CKD), stage I    when on diuretic with creatinine of 1.6   Complication of anesthesia    patient vomits with demerol   DJD (degenerative joint disease)    right knee and lumbosacral spine   Hearing impairment    History of angina 2001   normal [place]ary arteries in 2001   Hypertension    Macular degeneration, dry    + cataracts   Neuropathy    fingers and toes   Permanent atrial fibrillation (HCC)    PONV (postoperative nausea and vomiting)    Retinal macular atrophy    Sick sinus syndrome (HCC)    Paroxysmal to permanent AF; initially first degree AV block also noted     Allergies  Allergen Reactions   Altace [Ramipril] Rash     Current Outpatient Medications  Medication Sig Dispense Refill   cholecalciferol (VITAMIN D) 1000 UNITS tablet Take 1,000 Units by mouth daily.       dicyclomine (BENTYL) 10 MG capsule Take 1 capsule (10 mg total) by mouth 3 (three) times daily before meals. prn 270 capsule 3   dorzolamide (TRUSOPT) 2 % ophthalmic solution Place 1 drop into both eyes 2 (two) times daily.  latanoprost (XALATAN) 0.005 % ophthalmic solution Place 1 drop into both eyes at bedtime.   3   loperamide (IMODIUM A-D) 2 MG tablet TAKE ONE TABLET BY MOUTH TWICE DAILY AS NEEDED FOR DIARRHEA OR LOOSE STOOLS 200 tablet 0   metoprolol succinate (TOPROL-XL) 25 MG 24 hr tablet TAKE 1 TABLET IN ADDITION TO 50 MG TABLETS. 90 tablet 3   metoprolol succinate (TOPROL-XL) 50 MG 24 hr tablet Take 1 tablet (50 mg total) by mouth daily. Take with or immediately following a meal. 90 tablet 2   ondansetron (ZOFRAN) 8 MG tablet Take 1 tablet (8 mg total) by mouth every 8 (eight) hours as needed for nausea or vomiting. 30 tablet 1   potassium chloride SA (KLOR-CON) 20 MEQ tablet Take 1 tablet (20 mEq total) by mouth 2 (two) times daily. 60 tablet 2   torsemide (DEMADEX) 20 MG tablet Take 2 tablets (40 mg total) by mouth daily.  183 tablet 3   traMADol (ULTRAM) 50 MG tablet TAKE 1/2 TABLET BY MOUTH TWICE DAILY AS NEEDED FOR PAIN. ONLY TAKE IF NECESSARY. 30 tablet 0   valsartan (DIOVAN) 80 MG tablet Take 1 tablet (80 mg total) by mouth daily. 90 tablet 3   vitamin B-12 (CYANOCOBALAMIN) 1000 MCG tablet Take 1,000 mcg by mouth daily.     warfarin (COUMADIN) 5 MG tablet TAKE ONE TABLET BY MOUTH ONCE DAILY EXCEPT 1/2 TABLET ON SATURDAYS OR AS DIRECTED 90 tablet 3   No current facility-administered medications for this visit.     Past Surgical History:  Procedure Laterality Date   APPENDECTOMY     CATARACT EXTRACTION W/PHACO  10/11/2012   Procedure: CATARACT EXTRACTION PHACO AND INTRAOCULAR LENS PLACEMENT (IOC);  Surgeon: Williams Che, MD;  Location: [facility];  Service: Ophthalmology;  Laterality: Right;  CDE:10.82   COLONOSCOPY  02/11/2012   Colonoscopy plus polypectomy x2, [place]   COLONOSCOPY N/A 03/30/2017   Procedure: COLONOSCOPY;  Surgeon: Rogene Houston, MD;  Location: [facility];  Service: Endoscopy;  Laterality: N/A;  [place] N/A 01/19/2015   Procedure: ESOPHAGEAL DILATION;  Surgeon: Rogene Houston, MD;  Location: [facility];  Service: Endoscopy;  Laterality: N/A;   ESOPHAGOGASTRODUODENOSCOPY N/A 01/19/2015   Procedure: ESOPHAGOGASTRODUODENOSCOPY (EGD);  Surgeon: Rogene Houston, MD;  Location: [facility];  Service: Endoscopy;  Laterality: N/A;  910   MASTECTOMY PARTIAL / LUMPECTOMY W/ AXILLARY LYMPHADENECTOMY  2009   Carcinoma of the breast   MICRODISCECTOMY LUMBAR  2009   lumbosacral spine   MIDDLE EAR SURGERY     Left   POLYPECTOMY  03/30/2017   Procedure: POLYPECTOMY;  Surgeon: Rogene Houston, MD;  Location: [facility];  Service: Endoscopy;;  colon   PORT-A-CATH REMOVAL     TONSILLECTOMY       Allergies  Allergen Reactions   Altace [Ramipril] Rash      Family History  Problem Relation Age of Onset   Heart failure Mother     Stroke Brother    Ovarian cancer Maternal Grandmother        dx. 75s; +surgery   Breast cancer Maternal Aunt        dx. late 70s   Bladder Cancer Maternal Uncle        smoker   Stroke Paternal Uncle    Breast cancer Cousin 42   Prostate cancer Cousin 72   Cancer Cousin  unspecified type     Social History Ms. Icard reports that she quit smoking about 65 years ago. Her smoking use included cigarettes. She started smoking about 66 years ago. She has never used smokeless tobacco. Ms. Hagerty reports current alcohol use.   Review of Systems CONSTITUTIONAL: No weight loss, fever, chills, weakness or fatigue.  HEENT: Eyes: No visual loss, blurred vision, double vision or yellow sclerae.No hearing loss, sneezing, congestion, runny nose or sore throat.  SKIN: No rash or itching.  CARDIOVASCULAR: per hpi RESPIRATORY:per hpi GASTROINTESTINAL: No anorexia, nausea, vomiting or diarrhea. No abdominal pain or blood.  GENITOURINARY: No burning on urination, no polyuria NEUROLOGICAL: No headache, dizziness, syncope, paralysis, ataxia, numbness or tingling in the extremities. No change in bowel or bladder control.  MUSCULOSKELETAL: No muscle, back pain, joint pain or stiffness.  LYMPHATICS: No enlarged nodes. No history of splenectomy.  PSYCHIATRIC: No history of depression or anxiety.  ENDOCRINOLOGIC: No reports of sweating, cold or heat intolerance. No polyuria or polydipsia.  Marland Kitchen   Physical Examination Today's Vitals   10/07/21 1341  BP: 114/68  Pulse: 85  SpO2: 96%  Weight: 116 lb (52.6 kg)  Height: 4\' 11"  (1.499 m)   Body mass index is 23.43 kg/m.  Gen: resting comfortably, no acute distress HEENT: no scleral icterus, pupils equal round and reactive, no palptable cervical adenopathy,  CV: irreg, 3/6 systolic murmur apex Resp: Clear to auscultation bilaterally GI: abdomen is soft, non-tender, non-distended, normal bowel sounds, no hepatosplenomegaly MSK: extremities  are warm, 1+bilateral LE edema.  Skin: warm, no rash Neuro:  no focal deficits Psych: appropriate affect   Diagnostic Studies 12/2013 Echo LVEF 45-50%, elevated LA pressure, mild AI, moderate MR, mod TR, PASP 46   10/2014 Echo Study Conclusions  - Left ventricle: The cavity size was normal. Wall thickness was   normal. Systolic function was mildly to moderately reduced. The   estimated ejection fraction was in the range of 40% to 45%. - Aortic valve: There was mild regurgitation. Regurgitation   pressure half-time: 794 ms. - Mitral valve: Mildly calcified annulus. There was mild to   moderate regurgitation. MR vena contracta is 0.4 cm. - Left atrium: The atrium was severely dilated. - Right ventricle: The interventricular septum is flattened in   systole and diastole, consistent with RV pressure and volume   overload. The cavity size was mildly dilated. Systolic function   was low normal. RV TAPSE is 1.6 cm. - Right atrium: The atrium was severely dilated. - Atrial septum: No defect or patent foramen ovale was identified. - Pulmonary arteries: Systolic pressure was moderately increased.   PA peak pressure: 60 mm Hg (S). - Technically adequate study.     01/2016 echo Study Conclusions   - Left ventricle: The cavity size was normal. Wall thickness was   normal. Systolic function was mildly to moderately reduced. The   estimated ejection fraction was in the range of 40% to 45%.   Diffuse hypokinesis. - Aortic valve: Mildly calcified annulus. Trileaflet; mildly   thickened leaflets. There was mild to moderate regurgitation.   Valve area (VTI): 2.35 cm^2. Valve area (Vmax): 2.4 cm^2. Valve   area (Vmean): 2.6 cm^2. Regurgitation pressure half-time: 690 ms. - Mitral valve: Mildly calcified annulus. Mildly thickened leaflets   . There was mild to moderate regurgitation. - Left atrium: The atrium was severely dilated. - Right atrium: The atrium was severely dilated. - Pulmonary  arteries: Systolic pressure was moderately increased.   PA peak pressure:  42 mm Hg (S). - Technically adequate study.   01/2020 echo IMPRESSIONS     1. Left ventricular ejection fraction, by estimation, is 40%. The left  ventricle has mildly decreased function. The left ventricle demonstrates  global hypokinesis. There is mild left ventricular hypertrophy. Left  ventricular diastolic parameters are  consistent with Grade II diastolic dysfunction (pseudonormalization).  Elevated left atrial pressure.   2. Right ventricular systolic function is low normal. The right  ventricular size is normal. There is moderately elevated pulmonary artery  systolic pressure.   3. Left atrial size was severely dilated.   4. Right atrial size was moderately dilated.   5. The MR is eccentric and posteriorally directed. May lead to  underestimation, the color jet itself is not that impressive due to the  eccentric course of the jet. The MV to AV VTI ratio is 1, suggesting  moderate MR.. The mitral valve is abnormal.  Moderate mitral valve regurgitation. No evidence of mitral stenosis.   6. Tricuspid valve regurgitation is mild to moderate.   7. The aortic valve is tricuspid. Aortic valve regurgitation is mild to  moderate. No aortic stenosis is present.   8. The inferior vena cava is normal in size with greater than 50%  respiratory variability, suggesting right atrial pressure of 3 mmHg.    10/2020 echo IMPRESSIONS     1. Left ventricular ejection fraction, by estimation, is 35 to 40%. The  left ventricle has moderately decreased function. The left ventricle  demonstrates global hypokinesis. Left ventricular diastolic parameters are  indeterminate.   2. Right ventricular systolic function is low normal. The right  ventricular size is normal. There is mildly elevated pulmonary artery  systolic pressure. The estimated right ventricular systolic pressure is  35.0 mmHg.   3. Left atrial size was  severely dilated.   4. Right atrial size was severely dilated.   5. There is a trivial pericardial effusion posterior to the left  ventricle.   6. The mitral valve is abnormal. Moderate, posteriorly directed mitral  valve regurgitation.   7. The aortic valve is tricuspid. Aortic valve regurgitation is mild.  Mild aortic valve sclerosis is present, with no evidence of aortic valve  stenosis. Aortic regurgitation PHT measures 658 msec.   8. The inferior vena cava is dilated in size with <50% respiratory  variability, suggesting right atrial pressure of 15 mmHg.       Assessment and Plan  1. Chronic systolic HF  - weights are stable 111-115 lbs range, mild chronic LE edema - progressive DOE, we will repeat echo.  - in general medical therapy has been limited by orthostatic symptoms. We did discuss SGLT2i's at prior visit Previously entresto was too expensive, back on ARB - if no significant changes on echo, may try slightly more aggressive diuretic dosing.      2. Mitral regurgitation - moderate by last echo - repeat TTE, low treshold for TEE given her worsening DOE. Family has some interestest in mitraclip if she would potentially benefit, they would favor an evaluation at [facility] if indicated.      3. HTN   -she is at goal,continue current meds   4. Permaent afib   - not interested in NOACs, continue coumadin.  - permanent afib and has been historically well rate controlled on toprol, continue current meds - EKG today shows rate controlled afib         Arnoldo Lenis, M.D.

## 2021-10-07 NOTE — Patient Instructions (Signed)
Essential Tremor °A tremor is trembling or shaking that a person cannot control. Most tremors affect the hands or arms. Tremors can also affect the head, vocal cords, legs, and other parts of the body. Essential tremor is a tremor without a known cause. Usually, it occurs while a person is trying to perform an action. It tends to get worse gradually as a person ages. °What are the causes? °The cause of this condition is not known. °What increases the risk? °You are more likely to develop this condition if: °You have a family member with essential tremor. °You are age 40 or older. °You take certain medicines. °What are the signs or symptoms? °The main sign of a tremor is a rhythmic shaking of certain parts of your body that is uncontrolled and unintentional. You may: °Have difficulty eating with a spoon or fork. °Have difficulty writing. °Nod your head up and down or side to side. °Have a quivering voice. °The shaking may: °Get worse over time. °Come and go. °Be more noticeable on one side of your body. °Get worse due to stress, fatigue, caffeine, and extreme heat or cold. °How is this diagnosed? °This condition may be diagnosed based on: °Your symptoms and medical history. °A physical exam. °There is no single test to diagnose an essential tremor. However, your health care provider may order tests to rule out other causes of your condition. These may include: °Blood and urine tests. °Imaging studies of your brain, such as CT scan and MRI. °A test that measures involuntary muscle movement (electromyogram). °How is this treated? °Treatment for essential tremor depends on the severity of the condition. °Some tremors may go away without treatment. °Mild tremors may not need treatment if they do not affect your day-to-day life. °Severe tremors may need to be treated using one or more of the following options: °Medicines. °Lifestyle changes. °Occupational or physical therapy. °Follow these instructions at  home: °Lifestyle ° °Do not use any products that contain nicotine or tobacco, such as cigarettes and e-cigarettes. If you need help quitting, ask your health care provider. °Limit your caffeine intake as told by your health care provider. °Try to get 8 hours of sleep each night. °Find ways to manage your stress that fits your lifestyle and personality. Consider trying meditation or yoga. °Try to anticipate stressful situations and allow extra time to manage them. °If you are struggling emotionally with the effects of your tremor, consider working with a mental health provider. °General instructions °Take over-the-counter and prescription medicines only as told by your health care provider. °Avoid extreme heat and extreme cold. °Keep all follow-up visits as told by your health care provider. This is important. Visits may include physical therapy visits. °Contact a health care provider if: °You experience any changes in the location or intensity of your tremors. °You start having a tremor after starting a new medicine. °You have tremor with other symptoms, such as: °Numbness. °Tingling. °Pain. °Weakness. °Your tremor gets worse. °Your tremor interferes with your daily life. °You feel down, blue, or sad for at least 2 weeks in a row. °Worrying about your tremor and what other people think about you interferes with your everyday life functions, including relationships, work, or school. °Summary °Essential tremor is a tremor without a known cause. Usually, it occurs when you are trying to perform an action. °You are more likely to develop this condition if you have a family member with essential tremor. °The main sign of a tremor is a rhythmic shaking of   certain parts of your body that is uncontrolled and unintentional. °Treatment for essential tremor depends on the severity of the condition. °This information is not intended to replace advice given to you by your health care provider. Make sure you discuss any questions  you have with your health care provider. °Document Revised: 08/01/2020 Document Reviewed: 08/03/2020 °Elsevier Patient Education © 2022 Elsevier Inc. ° °

## 2021-10-08 ENCOUNTER — Telehealth: Payer: Self-pay | Admitting: Cardiology

## 2021-10-08 NOTE — Telephone Encounter (Signed)
Checking percert on the following patient for testing scheduled at [facility].     ECHO  10/16/2021

## 2021-10-15 ENCOUNTER — Other Ambulatory Visit (HOSPITAL_COMMUNITY)
Admission: RE | Admit: 2021-10-15 | Discharge: 2021-10-15 | Disposition: A | Discharge status: Home / Self Care | Payer: Medicare Other | Source: Ambulatory Visit | Attending: Cardiology | Admitting: Cardiology

## 2021-10-15 DIAGNOSIS — I10 Essential (primary) hypertension: Secondary | ICD-10-CM | POA: Diagnosis not present

## 2021-10-15 DIAGNOSIS — I5022 Chronic systolic (congestive) heart failure: Secondary | ICD-10-CM | POA: Insufficient documentation

## 2021-10-15 LAB — BRAIN NATRIURETIC PEPTIDE: B Natriuretic Peptide: 1384 pg/mL — ABNORMAL HIGH (ref 0.0–100.0)

## 2021-10-15 LAB — BASIC METABOLIC PANEL
Anion gap: 10 (ref 5–15)
BUN: 42 mg/dL — ABNORMAL HIGH (ref 8–23)
CO2: 24 mmol/L (ref 22–32)
Calcium: 9.7 mg/dL (ref 8.9–10.3)
Chloride: 107 mmol/L (ref 98–111)
Creatinine, Ser: 1.17 mg/dL — ABNORMAL HIGH (ref 0.44–1.00)
GFR, Estimated: 45 mL/min — ABNORMAL LOW (ref >60)
Glucose, Bld: 84 mg/dL (ref 70–99)
Potassium: 4.6 mmol/L (ref 3.5–5.1)
Sodium: 141 mmol/L (ref 135–145)

## 2021-10-15 LAB — MAGNESIUM: Magnesium: 2.3 mg/dL (ref 1.7–2.4)

## 2021-10-16 ENCOUNTER — Ambulatory Visit (HOSPITAL_COMMUNITY)
Admission: RE | Admit: 2021-10-16 | Discharge: 2021-10-16 | Disposition: A | Discharge status: Home / Self Care | Payer: Medicare Other | Source: Ambulatory Visit | Attending: Cardiology | Admitting: Cardiology

## 2021-10-16 DIAGNOSIS — I340 Nonrheumatic mitral (valve) insufficiency: Secondary | ICD-10-CM | POA: Insufficient documentation

## 2021-10-16 LAB — ECHOCARDIOGRAM COMPLETE
AR max vel: 2.18 cm2
AV Area VTI: 2.48 cm2
AV Area mean vel: 2.41 cm2
AV Mean grad: 2.8 mmHg
AV Peak grad: 6 mmHg
Ao pk vel: 1.22 m/s
Area-P 1/2: 7.99 cm2
P 1/2 time: 449 msec
S' Lateral: 4.2 cm

## 2021-10-16 NOTE — Progress Notes (Signed)
*  PRELIMINARY RESULTS* Echocardiogram 2D Echocardiogram has been performed.  Samuel Germany 10/16/2021, 1:15 PM

## 2021-10-21 ENCOUNTER — Other Ambulatory Visit (INDEPENDENT_AMBULATORY_CARE_PROVIDER_SITE_OTHER): Payer: Self-pay | Admitting: Internal Medicine

## 2021-10-21 ENCOUNTER — Ambulatory Visit (INDEPENDENT_AMBULATORY_CARE_PROVIDER_SITE_OTHER): Payer: Medicare Other | Admitting: *Deleted

## 2021-10-21 ENCOUNTER — Telehealth: Payer: Self-pay

## 2021-10-21 DIAGNOSIS — I4891 Unspecified atrial fibrillation: Secondary | ICD-10-CM

## 2021-10-21 DIAGNOSIS — Z5181 Encounter for therapeutic drug level monitoring: Secondary | ICD-10-CM

## 2021-10-21 LAB — POCT INR: INR: 2.7 (ref 2.0–3.0)

## 2021-10-21 NOTE — Telephone Encounter (Signed)
-----   Message from Arnoldo Lenis, MD sent at 10/21/2021  8:23 AM EST ----- Labs suggest keeping some extra fluid on her body. Confirm taking torsemide 40mg  daily, if so increase to 60mg  daily with a bmet/mg/bnp in 2 weeks   Zandra Abts MD

## 2021-10-21 NOTE — Patient Instructions (Signed)
Continue warfarin 1 tablet daily except 1/2 tablet on Tuesdays and Fridays Recheck in 3 weeks

## 2021-10-21 NOTE — Telephone Encounter (Signed)
Last visit  8.16.22

## 2021-10-21 NOTE — Telephone Encounter (Signed)
Pt had Coumadin check this morning and was given results and orders by Edrick Oh, RN. Pt verbalized understanding and had no questions or concerns at this time.

## 2021-10-23 ENCOUNTER — Telehealth: Payer: Self-pay | Admitting: Cardiology

## 2021-10-23 ENCOUNTER — Telehealth (INDEPENDENT_AMBULATORY_CARE_PROVIDER_SITE_OTHER): Payer: Self-pay | Admitting: *Deleted

## 2021-10-23 NOTE — Telephone Encounter (Signed)
Rx request from [place] [facility] requesting refill on potassium 20 meq one bid. Last seen 07/09/21 - per dr Laural Golden change potassium to one daily #30 with 2 refills and future refills should come from cardiologist. Med sent to pharm with new directions. Left message to return call to discuss with pt.

## 2021-10-23 NOTE — Telephone Encounter (Signed)
Patient's husband returning call for echo results.

## 2021-10-24 NOTE — Telephone Encounter (Signed)
Echo overall is stable showing heart pumping function remains at 40% (normal is 50-60%). THe mitral valve leak appears to be moderate from this study, but as we discussed during our clinic visit given she has worsening symptoms would be good to get a closer look at the valve with a transesophaeal echocardiogram. If they agree can set up TEE with anesthesia for mitral regurgitation.    Zandra Abts MD

## 2021-10-25 ENCOUNTER — Ambulatory Visit: Payer: Medicare Other | Admitting: Cardiology

## 2021-10-25 ENCOUNTER — Encounter: Payer: Self-pay | Admitting: Cardiology

## 2021-10-25 ENCOUNTER — Telehealth: Payer: Self-pay | Admitting: *Deleted

## 2021-10-25 NOTE — Telephone Encounter (Signed)
Left message to return call 

## 2021-10-25 NOTE — Telephone Encounter (Signed)
Looks like Caryl Pina called Nov 29 and left a message with echo results and to discuss TEE, can we take care of this today?  Zandra Abts MD

## 2021-10-25 NOTE — Telephone Encounter (Signed)
ECHOCARDIOGRAM COMPLETE: Result Notes  Berlinda Last, [facility]  10/22/2021  3:08 PM EST     Left a message to have patient call office for results and schedule TEE   Arnoldo Lenis, MD  10/21/2021  8:22 AM EST     Echo overall is stable showing heart pumping function remains at 40% (normal is 50-60%). THe mitral valve leak appears to be moderate from this study, but as we discussed during our clinic visit given she has worsening symptoms would be good to get a closer look at the valve with a transesophaeal echocardiogram. If they agree can set up TEE with anesthesia for mitral regurgitation.    Zandra Abts MD

## 2021-10-25 NOTE — Telephone Encounter (Signed)
Husband (Dr. Heide Spark) notified & verbalized understanding.    Will work on getting TEE scheduled within the next few weeks.   Would like to get done prior to them going out of town on 11/20/2021

## 2021-10-28 ENCOUNTER — Encounter: Payer: Self-pay | Admitting: *Deleted

## 2021-10-28 NOTE — Telephone Encounter (Signed)
TEE scheduled for Friday, 11/01/2021 at 9:30 am with Dr. Harl Bowie at [facility].  Patient will need to arrive at 8:00 am.    Pre-op day:  Wednesday, 10/30/2021 at 12:45 pm at [facility].

## 2021-10-28 NOTE — Telephone Encounter (Signed)
Left a message for pt to return call 

## 2021-10-29 ENCOUNTER — Other Ambulatory Visit: Payer: Self-pay | Admitting: Cardiology

## 2021-10-29 ENCOUNTER — Telehealth: Payer: Self-pay | Admitting: Cardiology

## 2021-10-29 DIAGNOSIS — I340 Nonrheumatic mitral (valve) insufficiency: Secondary | ICD-10-CM

## 2021-10-29 NOTE — Telephone Encounter (Signed)
Left a message for pt/pt husband to give office a call back regarding TEE schedule.

## 2021-10-29 NOTE — Telephone Encounter (Signed)
Called and discussed with pt's husband and he verbalized understanding.

## 2021-10-29 NOTE — Telephone Encounter (Signed)
Pt spouse called [place] office and talked to [facility], LPN concerning TEE and schedule.

## 2021-10-29 NOTE — Patient Instructions (Signed)
Alicia Harrington  10/29/2021     @PREFPERIOPPHARMACY @   Your procedure is scheduled on  11/01/2021.   Report to [facility] at  0800  A.M.   Call this number if you have problems the morning of surgery:  [phone]   Remember:  Do not eat or drink after midnight.      Take these medicines the morning of surgery with A SIP OF WATER               metoprolol, zofran(if needed), tramadol(if needed).     Do not wear jewelry, make-up or nail polish.  Do not wear lotions, powders, or perfumes, or deodorant.  Do not shave 48 hours prior to surgery.  Men may shave face and neck.  Do not bring valuables to the hospital.  [facility] is not responsible for any belongings or valuables.  Contacts, dentures or bridgework may not be worn into surgery.  Leave your suitcase in the car.  After surgery it may be brought to your room.  For patients admitted to the hospital, discharge time will be determined by your treatment team.  Patients discharged the day of surgery will not be allowed to drive home and must have someone with them for 24 hours.    Special instructions:   DO NOT smoke tobacco or vape for 24 hours before your procedure.  Please read over the following fact sheets that you were given. Anesthesia Post-op Instructions and Care and Recovery After Surgery       Transesophageal Echocardiogram Transesophageal echocardiogram (TEE) is a test that uses sound waves to take pictures of your heart. TEE is done by passing a small probe attached to a flexible tube down the part of the body that moves food from your mouth to your stomach (esophagus). The pictures give clear images of your heart. This can help your doctor see if there are problems with your heart. Tell a doctor about: Any allergies you have. All medicines you are taking. This includes vitamins, herbs, eye drops, creams, and over-the-counter medicines. Any problems you or family members have had with  anesthetic medicines. Any blood disorders you have. Any surgeries you have had. Any medical conditions you have. Any swallowing problems. Whether you have or have had a blockage in the part of the body that moves food from your mouth to your stomach. Whether you are pregnant or may be pregnant. What are the risks? In general, this is a safe procedure. But, problems may occur, such as: Damage to nearby structures or organs. A tear in the part of the body that moves food from your mouth to your stomach. Irregular heartbeat. Hoarse voice or trouble swallowing. Bleeding. What happens before the procedure? Medicines Ask your doctor about changing or stopping: Your normal medicines. Vitamins, herbs, and supplements. Over-the-counter medicines. Do not take aspirin or ibuprofen unless you are told to. General instructions Follow instructions from your doctor about what you cannot eat or drink. You will take out any dentures or dental retainers. Plan to have a responsible adult take you home from the hospital or clinic. Plan to have a responsible adult care for you for the time you are told after you leave the hospital or clinic. This is important. What happens during the procedure?  An IV will be put into one of your veins. You may be given: A sedative. This medicine helps you relax. A medicine to numb the back of your throat. This may  be sprayed or gargled. Your blood pressure, heart rate, and breathing will be watched. You may be asked to lie on your left side. A bite block will be placed in your mouth. This keeps you from biting the tube. The tip of the probe will be placed into the back of your mouth. You will be asked to swallow. Your doctor will take pictures of your heart. The probe and bite block will be taken out after the test is done. The procedure may vary among doctors and hospitals. What can I expect after the procedure? You will be monitored until you leave the  hospital or clinic. This includes checking your blood pressure, heart rate, breathing rate, and blood oxygen level. Your throat may feel sore and numb. This will get better over time. You will not be allowed to eat or drink until the numbness has gone away. It is common to have a sore throat for a day or two. It is up to you to get the results of your procedure. Ask how to get your results when they are ready. Follow these instructions at home: If you were given a sedative during your procedure, do not drive or use machines until your doctor says that it is safe. Return to your normal activities when your doctor says that it is safe. Keep all follow-up visits. Summary TEE is a test that uses sound waves to take pictures of your heart. You will be given a medicine to help you relax. Do not drive or use machines until your doctor says that it is safe. This information is not intended to replace advice given to you by your health care provider. Make sure you discuss any questions you have with your health care provider. Document Revised: 07/24/2021 Document Reviewed: 07/03/2020 Elsevier Patient Education  2022 [place] After This sheet gives you information about how to care for yourself after your procedure. Your health care provider may also give you more specific instructions. If you have problems or questions, contact your health care provider. What can I expect after the procedure? After the procedure, it is common to have: Tiredness. Forgetfulness about what happened after the procedure. Impaired judgment for important decisions. Nausea or vomiting. Some difficulty with balance. Follow these instructions at home: For the time period you were told by your health care provider:   Rest as needed. Do not participate in activities where you could fall or become injured. Do not drive or use machinery. Do not drink alcohol. Do not take sleeping pills  or medicines that cause drowsiness. Do not make important decisions or sign legal documents. Do not take care of children on your own. Eating and drinking Follow the diet that is recommended by your health care provider. Drink enough fluid to keep your urine pale yellow. If you vomit: Drink water, juice, or soup when you can drink without vomiting. Make sure you have little or no nausea before eating solid foods. General instructions Have a responsible adult stay with you for the time you are told. It is important to have someone help care for you until you are awake and alert. Take over-the-counter and prescription medicines only as told by your health care provider. If you have sleep apnea, surgery and certain medicines can increase your risk for breathing problems. Follow instructions from your health care provider about wearing your sleep device: Anytime you are sleeping, including during daytime naps. While taking prescription pain medicines, sleeping medicines, or medicines that  make you drowsy. Avoid smoking. Keep all follow-up visits as told by your health care provider. This is important. Contact a health care provider if: You keep feeling nauseous or you keep vomiting. You feel light-headed. You are still sleepy or having trouble with balance after 24 hours. You develop a rash. You have a fever. You have redness or swelling around the IV site. Get help right away if: You have trouble breathing. You have new-onset confusion at home. Summary For several hours after your procedure, you may feel tired. You may also be forgetful and have poor judgment. Have a responsible adult stay with you for the time you are told. It is important to have someone help care for you until you are awake and alert. Rest as told. Do not drive or operate machinery. Do not drink alcohol or take sleeping pills. Get help right away if you have trouble breathing, or if you suddenly become confused. This  information is not intended to replace advice given to you by your health care provider. Make sure you discuss any questions you have with your health care provider. Document Revised: 07/26/2020 Document Reviewed: 10/13/2019 Elsevier Patient Education  2022 [employer].

## 2021-10-29 NOTE — Telephone Encounter (Signed)
Pt has TEE scheduled for 12/9 with Dr. Harl Bowie and pre-op scheduled for 12/7. Received pt msg this morning from pt husband who stated that pt had another appt on one of these days and may need to reschedule TEE.

## 2021-10-29 NOTE — Telephone Encounter (Signed)
PERCERT:  TEE scheduled for Friday, 11/01/2021 at 9:30 am with Dr. Harl Bowie at [facility].

## 2021-10-30 ENCOUNTER — Other Ambulatory Visit: Payer: Self-pay

## 2021-10-30 ENCOUNTER — Encounter (HOSPITAL_COMMUNITY)
Admission: RE | Admit: 2021-10-30 | Discharge: 2021-10-30 | Disposition: A | Discharge status: Home / Self Care | Payer: Medicare Other | Source: Ambulatory Visit | Attending: Cardiology | Admitting: Cardiology

## 2021-10-30 ENCOUNTER — Encounter (HOSPITAL_COMMUNITY): Payer: Self-pay

## 2021-10-30 DIAGNOSIS — H04123 Dry eye syndrome of bilateral lacrimal glands: Secondary | ICD-10-CM | POA: Diagnosis not present

## 2021-10-30 DIAGNOSIS — H401133 Primary open-angle glaucoma, bilateral, severe stage: Secondary | ICD-10-CM | POA: Diagnosis not present

## 2021-10-30 DIAGNOSIS — Z961 Presence of intraocular lens: Secondary | ICD-10-CM | POA: Diagnosis not present

## 2021-10-30 DIAGNOSIS — H353130 Nonexudative age-related macular degeneration, bilateral, stage unspecified: Secondary | ICD-10-CM | POA: Diagnosis not present

## 2021-10-30 NOTE — Progress Notes (Signed)
Dr Charna Elizabeth in to assess patient and discuss anesthesia plan and risks for TEE with her and her husband Dr Heide Spark.

## 2021-11-01 ENCOUNTER — Encounter (HOSPITAL_COMMUNITY)
Admission: RE | Disposition: A | Discharge status: Home / Self Care | Payer: Self-pay | Source: Ambulatory Visit | Attending: Cardiology

## 2021-11-01 ENCOUNTER — Ambulatory Visit (HOSPITAL_COMMUNITY): Payer: Medicare Other | Admitting: Anesthesiology

## 2021-11-01 ENCOUNTER — Ambulatory Visit (HOSPITAL_BASED_OUTPATIENT_CLINIC_OR_DEPARTMENT_OTHER): Payer: Medicare Other

## 2021-11-01 ENCOUNTER — Encounter (HOSPITAL_COMMUNITY): Payer: Self-pay | Admitting: Cardiology

## 2021-11-01 ENCOUNTER — Ambulatory Visit (HOSPITAL_COMMUNITY)
Admission: RE | Admit: 2021-11-01 | Discharge: 2021-11-01 | Disposition: A | Discharge status: Home / Self Care | Payer: Medicare Other | Source: Ambulatory Visit | Attending: Cardiology | Admitting: Cardiology

## 2021-11-01 DIAGNOSIS — J449 Chronic obstructive pulmonary disease, unspecified: Secondary | ICD-10-CM | POA: Insufficient documentation

## 2021-11-01 DIAGNOSIS — Z79899 Other long term (current) drug therapy: Secondary | ICD-10-CM | POA: Insufficient documentation

## 2021-11-01 DIAGNOSIS — Z87891 Personal history of nicotine dependence: Secondary | ICD-10-CM | POA: Insufficient documentation

## 2021-11-01 DIAGNOSIS — Z853 Personal history of malignant neoplasm of breast: Secondary | ICD-10-CM | POA: Diagnosis not present

## 2021-11-01 DIAGNOSIS — I4821 Permanent atrial fibrillation: Secondary | ICD-10-CM | POA: Insufficient documentation

## 2021-11-01 DIAGNOSIS — I4891 Unspecified atrial fibrillation: Secondary | ICD-10-CM | POA: Diagnosis not present

## 2021-11-01 DIAGNOSIS — I340 Nonrheumatic mitral (valve) insufficiency: Secondary | ICD-10-CM

## 2021-11-01 DIAGNOSIS — I5022 Chronic systolic (congestive) heart failure: Secondary | ICD-10-CM | POA: Insufficient documentation

## 2021-11-01 DIAGNOSIS — Z7901 Long term (current) use of anticoagulants: Secondary | ICD-10-CM | POA: Insufficient documentation

## 2021-11-01 DIAGNOSIS — I130 Hypertensive heart and chronic kidney disease with heart failure and stage 1 through stage 4 chronic kidney disease, or unspecified chronic kidney disease: Secondary | ICD-10-CM | POA: Diagnosis not present

## 2021-11-01 DIAGNOSIS — N181 Chronic kidney disease, stage 1: Secondary | ICD-10-CM | POA: Diagnosis not present

## 2021-11-01 DIAGNOSIS — I083 Combined rheumatic disorders of mitral, aortic and tricuspid valves: Secondary | ICD-10-CM | POA: Insufficient documentation

## 2021-11-01 DIAGNOSIS — I429 Cardiomyopathy, unspecified: Secondary | ICD-10-CM | POA: Insufficient documentation

## 2021-11-01 HISTORY — PX: TEE WITHOUT CARDIOVERSION: SHX5443

## 2021-11-01 SURGERY — ECHOCARDIOGRAM, TRANSESOPHAGEAL
Anesthesia: General

## 2021-11-01 MED ORDER — LIDOCAINE VISCOUS HCL 2 % MT SOLN
OROMUCOSAL | Status: AC
  Filled 2021-11-01: qty 15

## 2021-11-01 MED ORDER — ORAL CARE MOUTH RINSE: 15.0000 mL | Freq: Once | OROMUCOSAL | Status: AC

## 2021-11-01 MED ORDER — SODIUM CHLORIDE 0.9 % IV SOLN: INTRAVENOUS | Status: DC

## 2021-11-01 MED ORDER — LIDOCAINE HCL (CARDIAC) PF 100 MG/5ML IV SOSY
PREFILLED_SYRINGE | INTRAVENOUS | Status: DC | PRN
  Administered 2021-11-01: 50 mg via INTRAVENOUS

## 2021-11-01 MED ORDER — CHLORHEXIDINE GLUCONATE 0.12 % MT SOLN
15.0000 mL | Freq: Once | OROMUCOSAL | Status: AC
  Administered 2021-11-01: 15 mL via OROMUCOSAL

## 2021-11-01 MED ORDER — PROPOFOL 10 MG/ML IV BOLUS
INTRAVENOUS | Status: DC | PRN
  Administered 2021-11-01: 30 mg via INTRAVENOUS
  Administered 2021-11-01: 100 mg via INTRAVENOUS
  Administered 2021-11-01: 30 mg via INTRAVENOUS
  Administered 2021-11-01: 20 mg via INTRAVENOUS
  Administered 2021-11-01: 30 mg via INTRAVENOUS

## 2021-11-01 MED ORDER — LACTATED RINGERS IV SOLN: INTRAVENOUS | Status: DC

## 2021-11-01 NOTE — Transfer of Care (Signed)
Immediate Anesthesia Transfer of Care Note  Patient: Alicia Harrington  Procedure(s) Performed: TRANSESOPHAGEAL ECHOCARDIOGRAM (TEE)  Patient Location: PACU  Anesthesia Type:General  Level of Consciousness: awake and patient cooperative  Airway & Oxygen Therapy: Patient Spontanous Breathing and Patient connected to nasal cannula oxygen  Post-op Assessment: Report given to RN and Post -op Vital signs reviewed and stable  Post vital signs: Reviewed and stable  Last Vitals:  Vitals Value Taken Time  BP    Temp    Pulse    Resp    SpO2      Last Pain:  Vitals:   11/01/21 0845  TempSrc: Oral  PainSc: 2       Patients Stated Pain Goal: 5 (01/56/15 3794)  Complications: No notable events documented.

## 2021-11-01 NOTE — Anesthesia Procedure Notes (Signed)
Date/Time: 11/01/2021 9:20 AM Performed by: Vista Deck, CRNA Pre-anesthesia Checklist: Patient identified, Emergency Drugs available, Suction available, Timeout performed and Patient being monitored Patient Re-evaluated:Patient Re-evaluated prior to induction Oxygen Delivery Method: Nasal Cannula

## 2021-11-01 NOTE — H&P (Signed)
Procedure H&P   Patient presents for TEE for further evaluation of mitral regurgitration. For complete history please refer to references clinic note below. Plan for TEE today with the assistance of anesthesiology.    Carlyle Dolly MD    Clinical Summary Ms. Zartman is a 87 y.o.female seen today for follow up of the following medical problems.        1. Chronic systolic heart failure - possibly chemo induced, secondary to Adriamycin.   - echo 01/2016 LVEF 40-45%, mild to moderate AI, mild to moderate MR, PASP 42  - previous radiation treatments, previous CXRs have shown some scarring and COPD changes however had normal PFTs     09/2018 echo LVEF 35-40%, diffuse hypokinesis, moderate MR, mild AI - issues with entresto and cost through there insurance , back on ARB. Prior Low heart rates, orthostatic symptoms have limited medical therapy.      10/2020 echo LVEF 84-69%, indet diastolic fxn, severe BAE, mod MR, mild AI - some ongoing edema at times. Taking lasix 100mg  alteranting days 80mg , home weights stable 112-115 lbs.   - DOE < 100 feet. Weights are stabe around 111-115 lbs. Chronically mild LE edema - takes 40mg  daily, takes additonal tablet prn as needed - 06/2021 Cr 1.1 - 1.2     2. Valvular heart disease 01/2020 echo LVEF 40%, mod MR, mild to mod AI 10/2020 echo LVEF 62-95%, indet diastolic fxn, severe BAE, mod MR   - reports some progression of her DOE, unclear if could be valve related.    3. HTN     -she is compliant with meds   4. Permanent afib   - has not been interested in NOACs, remains on coumadin   - no recent palpitations.  - no bleeding on coumadin       5. Breast CA - followed by oncology     6. Chronic pneumonitis - related to prior cancer treatments.          Travels regularly to [place] and [place] with her husband, he is a retired Software engineer.          Past Medical History:  Diagnosis Date   Adenocarcinoma, breast ([place])       treated with lumpectomy, node dissection, chemo and RT   Ataxia     Breast cancer ([place]) 2009    IDC+DCIS of right breast; triple positive   Cardiomyopathy 2009    possibly secondary to Adriamycin; EF 40% in 12/09 and 50% 3/10; pulmonary edema in 2009   Chronic anticoagulation 04/16/2011   Chronic kidney disease (CKD), stage I      when on diuretic with creatinine of 1.6   Complication of anesthesia      patient vomits with demerol   DJD (degenerative joint disease)      right knee and lumbosacral spine   Hearing impairment     History of angina 2001    normal cornary arteries in 2001   Hypertension     Macular degeneration, dry      + cataracts   Neuropathy      fingers and toes   Permanent atrial fibrillation (HCC)     PONV (postoperative nausea and vomiting)     Retinal macular atrophy     Sick sinus syndrome (HCC)      Paroxysmal to permanent AF; initially first degree AV block also noted            Allergies  Allergen Reactions   Altace [Ramipril]  Rash              Current Outpatient Medications  Medication Sig Dispense Refill   cholecalciferol (VITAMIN D) 1000 UNITS tablet Take 1,000 Units by mouth daily.         dicyclomine (BENTYL) 10 MG capsule Take 1 capsule (10 mg total) by mouth 3 (three) times daily before meals. prn 270 capsule 3   dorzolamide (TRUSOPT) 2 % ophthalmic solution Place 1 drop into both eyes 2 (two) times daily.        latanoprost (XALATAN) 0.005 % ophthalmic solution Place 1 drop into both eyes at bedtime.    3   loperamide (IMODIUM A-D) 2 MG tablet TAKE ONE TABLET BY MOUTH TWICE DAILY AS NEEDED FOR DIARRHEA OR LOOSE STOOLS 200 tablet 0   metoprolol succinate (TOPROL-XL) 25 MG 24 hr tablet TAKE 1 TABLET IN ADDITION TO 50 MG TABLETS. 90 tablet 3   metoprolol succinate (TOPROL-XL) 50 MG 24 hr tablet Take 1 tablet (50 mg total) by mouth daily. Take with or immediately following a meal. 90 tablet 2   ondansetron (ZOFRAN) 8 MG tablet Take 1  tablet (8 mg total) by mouth every 8 (eight) hours as needed for nausea or vomiting. 30 tablet 1   potassium chloride SA (KLOR-CON) 20 MEQ tablet Take 1 tablet (20 mEq total) by mouth 2 (two) times daily. 60 tablet 2   torsemide (DEMADEX) 20 MG tablet Take 2 tablets (40 mg total) by mouth daily. 183 tablet 3   traMADol (ULTRAM) 50 MG tablet TAKE 1/2 TABLET BY MOUTH TWICE DAILY AS NEEDED FOR PAIN. ONLY TAKE IF NECESSARY. 30 tablet 0   valsartan (DIOVAN) 80 MG tablet Take 1 tablet (80 mg total) by mouth daily. 90 tablet 3   vitamin B-12 (CYANOCOBALAMIN) 1000 MCG tablet Take 1,000 mcg by mouth daily.       warfarin (COUMADIN) 5 MG tablet TAKE ONE TABLET BY MOUTH ONCE DAILY EXCEPT 1/2 TABLET ON SATURDAYS OR AS DIRECTED 90 tablet 3    No current facility-administered medications for this visit.             Past Surgical History:  Procedure Laterality Date   APPENDECTOMY       CATARACT EXTRACTION W/PHACO   10/11/2012    Procedure: CATARACT EXTRACTION PHACO AND INTRAOCULAR LENS PLACEMENT (IOC);  Surgeon: Williams Che, MD;  Location: [facility];  Service: Ophthalmology;  Laterality: Right;  CDE:10.82   COLONOSCOPY   02/11/2012    Colonoscopy plus polypectomy x2, [facility]   COLONOSCOPY N/A 03/30/2017    Procedure: COLONOSCOPY;  Surgeon: Rogene Houston, MD;  Location: [facility];  Service: Endoscopy;  Laterality: N/A;  [place] N/A 01/19/2015    Procedure: ESOPHAGEAL DILATION;  Surgeon: Rogene Houston, MD;  Location: [facility];  Service: Endoscopy;  Laterality: N/A;   ESOPHAGOGASTRODUODENOSCOPY N/A 01/19/2015    Procedure: ESOPHAGOGASTRODUODENOSCOPY (EGD);  Surgeon: Rogene Houston, MD;  Location: [facility];  Service: Endoscopy;  Laterality: N/A;  910   MASTECTOMY PARTIAL / LUMPECTOMY W/ AXILLARY LYMPHADENECTOMY   2009    Carcinoma of the breast   MICRODISCECTOMY LUMBAR   2009    lumbosacral spine   MIDDLE EAR SURGERY        Left    POLYPECTOMY   03/30/2017    Procedure: POLYPECTOMY;  Surgeon: Rogene Houston, MD;  Location: [facility];  Service: Endoscopy;;  colon   PORT-A-CATH REMOVAL       TONSILLECTOMY                Allergies  Allergen Reactions   Altace [Ramipril] Rash               Family History  Problem Relation Age of Onset   Heart failure Mother     Stroke Brother     Ovarian cancer Maternal Grandmother          dx. 53s; +surgery   Breast cancer Maternal Aunt          dx. late 70s   Bladder Cancer Maternal Uncle          smoker   Stroke Paternal Uncle     Breast cancer Cousin 69   Prostate cancer Cousin 84   Cancer Cousin          unspecified type        Social History Ms. Mentzel reports that she quit smoking about 65 years ago. Her smoking use included cigarettes. She started smoking about 66 years ago. She has never used smokeless tobacco. Ms. Anthis reports current alcohol use.     Review of Systems CONSTITUTIONAL: No weight loss, fever, chills, weakness or fatigue.  HEENT: Eyes: No visual loss, blurred vision, double vision or yellow sclerae.No hearing loss, sneezing, congestion, runny nose or sore throat.  SKIN: No rash or itching.  CARDIOVASCULAR: per hpi RESPIRATORY:per hpi GASTROINTESTINAL: No anorexia, nausea, vomiting or diarrhea. No abdominal pain or blood.  GENITOURINARY: No burning on urination, no polyuria NEUROLOGICAL: No headache, dizziness, syncope, paralysis, ataxia, numbness or tingling in the extremities. No change in bowel or bladder control.  MUSCULOSKELETAL: No muscle, back pain, joint pain or stiffness.  LYMPHATICS: No enlarged nodes. No history of splenectomy.  PSYCHIATRIC: No history of depression or anxiety.  ENDOCRINOLOGIC: No reports of sweating, cold or heat intolerance. No polyuria or polydipsia.  Marland Kitchen     Physical Examination    Today's Vitals    10/07/21 1341  BP: 114/68  Pulse: 85  SpO2: 96%  Weight: 116 lb (52.6 kg)  Height: 4\' 11"   (1.499 m)    Body mass index is 23.43 kg/m.   Gen: resting comfortably, no acute distress HEENT: no scleral icterus, pupils equal round and reactive, no palptable cervical adenopathy,  CV: irreg, 3/6 systolic murmur apex Resp: Clear to auscultation bilaterally GI: abdomen is soft, non-tender, non-distended, normal bowel sounds, no hepatosplenomegaly MSK: extremities are warm, 1+bilateral LE edema.  Skin: warm, no rash Neuro:  no focal deficits Psych: appropriate affect     Diagnostic Studies 12/2013 Echo LVEF 45-50%, elevated LA pressure, mild AI, moderate MR, mod TR, PASP 46   10/2014 Echo Study Conclusions  - Left ventricle: The cavity size was normal. Wall thickness was   normal. Systolic function was mildly to moderately reduced. The   estimated ejection fraction was in the range of 40% to 45%. - Aortic valve: There was mild regurgitation. Regurgitation   pressure half-time: 794 ms. - Mitral valve: Mildly calcified annulus. There was mild to   moderate regurgitation. MR vena contracta is 0.4 cm. - Left atrium: The atrium was severely dilated. - Right ventricle: The interventricular septum is flattened in   systole and diastole, consistent with RV pressure and volume   overload. The cavity size was mildly dilated. Systolic function   was low normal. RV TAPSE is 1.6 cm. - Right atrium: The atrium was  severely dilated. - Atrial septum: No defect or patent foramen ovale was identified. - Pulmonary arteries: Systolic pressure was moderately increased.   PA peak pressure: 60 mm Hg (S). - Technically adequate study.     01/2016 echo Study Conclusions   - Left ventricle: The cavity size was normal. Wall thickness was   normal. Systolic function was mildly to moderately reduced. The   estimated ejection fraction was in the range of 40% to 45%.   Diffuse hypokinesis. - Aortic valve: Mildly calcified annulus. Trileaflet; mildly   thickened leaflets. There was mild to moderate  regurgitation.   Valve area (VTI): 2.35 cm^2. Valve area (Vmax): 2.4 cm^2. Valve   area (Vmean): 2.6 cm^2. Regurgitation pressure half-time: 690 ms. - Mitral valve: Mildly calcified annulus. Mildly thickened leaflets   . There was mild to moderate regurgitation. - Left atrium: The atrium was severely dilated. - Right atrium: The atrium was severely dilated. - Pulmonary arteries: Systolic pressure was moderately increased.   PA peak pressure: 42 mm Hg (S). - Technically adequate study.   01/2020 echo IMPRESSIONS     1. Left ventricular ejection fraction, by estimation, is 40%. The left  ventricle has mildly decreased function. The left ventricle demonstrates  global hypokinesis. There is mild left ventricular hypertrophy. Left  ventricular diastolic parameters are  consistent with Grade II diastolic dysfunction (pseudonormalization).  Elevated left atrial pressure.   2. Right ventricular systolic function is low normal. The right  ventricular size is normal. There is moderately elevated pulmonary artery  systolic pressure.   3. Left atrial size was severely dilated.   4. Right atrial size was moderately dilated.   5. The MR is eccentric and posteriorally directed. May lead to  underestimation, the color jet itself is not that impressive due to the  eccentric course of the jet. The MV to AV VTI ratio is 1, suggesting  moderate MR.. The mitral valve is abnormal.  Moderate mitral valve regurgitation. No evidence of mitral stenosis.   6. Tricuspid valve regurgitation is mild to moderate.   7. The aortic valve is tricuspid. Aortic valve regurgitation is mild to  moderate. No aortic stenosis is present.   8. The inferior vena cava is normal in size with greater than 50%  respiratory variability, suggesting right atrial pressure of 3 mmHg.    10/2020 echo IMPRESSIONS     1. Left ventricular ejection fraction, by estimation, is 35 to 40%. The  left ventricle has moderately decreased  function. The left ventricle  demonstrates global hypokinesis. Left ventricular diastolic parameters are  indeterminate.   2. Right ventricular systolic function is low normal. The right  ventricular size is normal. There is mildly elevated pulmonary artery  systolic pressure. The estimated right ventricular systolic pressure is  76.5 mmHg.   3. Left atrial size was severely dilated.   4. Right atrial size was severely dilated.   5. There is a trivial pericardial effusion posterior to the left  ventricle.   6. The mitral valve is abnormal. Moderate, posteriorly directed mitral  valve regurgitation.   7. The aortic valve is tricuspid. Aortic valve regurgitation is mild.  Mild aortic valve sclerosis is present, with no evidence of aortic valve  stenosis. Aortic regurgitation PHT measures 658 msec.   8. The inferior vena cava is dilated in size with <50% respiratory  variability, suggesting right atrial pressure of 15 mmHg.          Assessment and Plan  1. Chronic systolic HF  -  weights are stable 111-115 lbs range, mild chronic LE edema - progressive DOE, we will repeat echo.  - in general medical therapy has been limited by orthostatic symptoms. We did discuss SGLT2i's at prior visit Previously entresto was too expensive, back on ARB - if no significant changes on echo, may try slightly more aggressive diuretic dosing.      2. Mitral regurgitation - moderate by last echo - repeat TTE, low treshold for TEE given her worsening DOE. Family has some interestest in mitraclip if she would potentially benefit, they would favor an evaluation at [facility] if indicated.      3. HTN   -she is at goal,continue current meds   4. Permaent afib   - not interested in NOACs, continue coumadin.  - permanent afib and has been historically well rate controlled on toprol, continue current meds - EKG today shows rate controlled afib                 Arnoldo Lenis, M.D.

## 2021-11-01 NOTE — Anesthesia Preprocedure Evaluation (Signed)
Anesthesia Evaluation  Patient identified by MRN, date of birth, ID band Patient awake    Reviewed: Allergy & Precautions, H&P , NPO status , Patient's Chart, lab work & pertinent test results  History of Anesthesia Complications (+) PONV and history of anesthetic complications  Airway Mallampati: II  TM Distance: >3 FB Neck ROM: Full    Dental  (+) Dental Advisory Given, Caps,    Pulmonary neg pulmonary ROS, former smoker,    Pulmonary exam normal breath sounds clear to auscultation       Cardiovascular hypertension, Pt. on medications + dysrhythmias Atrial Fibrillation + Valvular Problems/Murmurs MR  Rhythm:Regular Rate:Normal + Systolic murmurs Echo overall is stable showing heart pumping function remains at 40% (normal is 50-60%). THe mitral valve leak appears to be moderate from this study, but as we discussed during our clinic visit given she has worsening symptoms would be good to get a closer look at the valve with a transesophaeal echocardiogram. If they agree can set up TEE with anesthesia for mitral regurgitation.   Zandra Abts MD   Neuro/Psych negative neurological ROS  negative psych ROS   GI/Hepatic negative GI ROS, (+) Cirrhosis       ,   Endo/Other  negative endocrine ROS  Renal/GU Renal disease  negative genitourinary   Musculoskeletal  (+) Arthritis ,   Abdominal   Peds negative pediatric ROS (+)  Hematology negative hematology ROS (+)   Anesthesia Other Findings   Reproductive/Obstetrics negative OB ROS                             Anesthesia Physical Anesthesia Plan  ASA: 4  Anesthesia Plan: General   Post-op Pain Management: Minimal or no pain anticipated   Induction: Intravenous  PONV Risk Score and Plan: TIVA  Airway Management Planned: Natural Airway and Nasal Cannula  Additional Equipment:   Intra-op Plan:   Post-operative Plan:   Informed  Consent: I have reviewed the patients History and Physical, chart, labs and discussed the procedure including the risks, benefits and alternatives for the proposed anesthesia with the patient or authorized representative who has indicated his/her understanding and acceptance.     Dental advisory given  Plan Discussed with: CRNA and Surgeon  Anesthesia Plan Comments:         Anesthesia Quick Evaluation

## 2021-11-01 NOTE — CV Procedure (Signed)
CV procedure note   Procedure: Transesophageal echocardiogram Indication: Mitral regurgitation Physician: Dr Carlyle Dolly MD  Patient presents for elective transesophageal echocardiogram for mitral regurgitation. She was brought to the procedure suite after appropriate consent was obtained. The posterior oropharynx was anesthesized with 2% viscous lidocaine, and bite block placed. The TEE probe was intubated into the esophagus without difficultly and several images were obtained. Please see full TEE report for full findings. Overall appears to be moderate mitral regurgitation. Cardiopulmonary monitoring was performed throughout the procedure, she tolerated the procedure well without complications.    Carlyle Dolly MD

## 2021-11-01 NOTE — Anesthesia Postprocedure Evaluation (Signed)
Anesthesia Post Note  Patient: Alicia Harrington  Procedure(s) Performed: TRANSESOPHAGEAL ECHOCARDIOGRAM (TEE)  Patient location during evaluation: Phase II Anesthesia Type: General Level of consciousness: awake and alert and oriented Pain management: pain level controlled Vital Signs Assessment: post-procedure vital signs reviewed and stable Respiratory status: spontaneous breathing, nonlabored ventilation and respiratory function stable Cardiovascular status: blood pressure returned to baseline and stable Postop Assessment: no apparent nausea or vomiting Anesthetic complications: no   No notable events documented.   Last Vitals:  Vitals:   11/01/21 1045 11/01/21 1054  BP: 92/60 111/64  Pulse: 65 67  Resp: (!) 22 (!) 21  Temp:  36.6 C  SpO2: 99% 98%    Last Pain:  Vitals:   11/01/21 1054  TempSrc: Oral  PainSc: 0-No pain                 Jacqualyn Sedgwick C Chrystie Hagwood

## 2021-11-01 NOTE — Progress Notes (Signed)
*  PRELIMINARY RESULTS* Echocardiogram Echocardiogram Transesophageal has been performed.  Samuel Germany 11/01/2021, 11:45 AM

## 2021-11-05 ENCOUNTER — Encounter: Payer: Self-pay | Admitting: Cardiology

## 2021-11-05 ENCOUNTER — Encounter (HOSPITAL_COMMUNITY): Payer: Self-pay | Admitting: Cardiology

## 2021-11-08 ENCOUNTER — Telehealth: Payer: Self-pay

## 2021-11-08 NOTE — Telephone Encounter (Deleted)
-----   Message from Arnoldo Lenis, MD sent at 11/01/2021  3:15 PM EST ----- Can we call patient and let them know from the final read on the echo that the mitral valve is just moderately leaky. There are some signs that she is keeping some extra fluid in her body based on bloodwork and echo findings, did she go up on the torsemide to 60mg  from our last phone note, if not would increase. I think we should relook at the cost of entresto for her, it is a better medication than what she is on now and may help over time with her heart symptoms, if they are interested can we get them a price from there pharmacy.   Zandra Abts MD

## 2021-11-08 NOTE — Telephone Encounter (Signed)
-----   Message from Arnoldo Lenis, MD sent at 11/01/2021  3:15 PM EST ----- Can we call patient and let them know from the final read on the echo that the mitral valve is just moderately leaky. There are some signs that she is keeping some extra fluid in her body based on bloodwork and echo findings, did she go up on the torsemide to 60mg  from our last phone note, if not would increase. I think we should relook at the cost of entresto for her, it is a better medication than what she is on now and may help over time with her heart symptoms, if they are interested can we get them a price from there pharmacy.   Zandra Abts MD      I discussed Dr.Branch's message with spouse. He confirmed she is taking Torsemide 60 mg qd. He is hesitant to start Entresto as his out of pocket is nearly $500. I told him I will speak with pharmacist at [facility] to see what his actual cost is.     I spoke with pharmacist, Vaughan Basta, at [facility]. Starting 2023, there is a 1 time yearly out of pocket deductible of $552, then medication is $47/monthly until they reach coverage gap "donut hole" and drug would then be $177/month.   Dr.Shamblin states he is willing to do this as Ms.Ortloff's quality of life is decreasing.[facility] will [facility] [place] in January.They still have a $480 deductible this year so they elect to wait until next year to start.    I will FYI Dr.Branch

## 2021-11-12 ENCOUNTER — Other Ambulatory Visit: Payer: Self-pay

## 2021-11-12 ENCOUNTER — Ambulatory Visit (INDEPENDENT_AMBULATORY_CARE_PROVIDER_SITE_OTHER): Payer: Medicare Other | Admitting: *Deleted

## 2021-11-12 DIAGNOSIS — Z5181 Encounter for therapeutic drug level monitoring: Secondary | ICD-10-CM | POA: Diagnosis not present

## 2021-11-12 DIAGNOSIS — I4891 Unspecified atrial fibrillation: Secondary | ICD-10-CM | POA: Diagnosis not present

## 2021-11-12 LAB — POCT INR: INR: 2.5 (ref 2.0–3.0)

## 2021-11-12 NOTE — Patient Instructions (Signed)
Continue warfarin 1 tablet daily except 1/2 tablet on Tuesdays and Fridays Recheck in 4 weeks Leaving for [place] on 12/28.  Standing INR orders given

## 2021-11-22 ENCOUNTER — Ambulatory Visit: Payer: Medicare Other | Admitting: Cardiology

## 2021-11-27 MED ORDER — ENTRESTO 24-26 MG PO TABS: 1.0000 | ORAL_TABLET | Freq: Two times a day (BID) | ORAL | 11 refills | Status: AC

## 2021-11-27 NOTE — Telephone Encounter (Signed)
Valsartan stopped. After 36 hrs, begin Entresto 24/26 mg bid. [facility] [place]   I have already spoken with Dr.Mcinerny and they wished to start Entresto after 11/24/21 due to insurance coverage.I will send him a message [facility] MyChart.

## 2021-11-27 NOTE — Telephone Encounter (Signed)
-----   Message from Bernita Raisin, RN sent at 11/08/2021  9:42 AM EST ----- Regarding: send rx entresto to [facility] Send entresto to [facility] in [place], also 30 day free trial

## 2021-12-09 DIAGNOSIS — I4891 Unspecified atrial fibrillation: Secondary | ICD-10-CM | POA: Diagnosis not present

## 2021-12-09 DIAGNOSIS — Z5181 Encounter for therapeutic drug level monitoring: Secondary | ICD-10-CM | POA: Diagnosis not present

## 2021-12-09 LAB — POCT INR: INR: 2.3 (ref 2.0–3.0)

## 2021-12-11 ENCOUNTER — Encounter: Payer: Self-pay | Admitting: Cardiology

## 2021-12-11 ENCOUNTER — Ambulatory Visit (INDEPENDENT_AMBULATORY_CARE_PROVIDER_SITE_OTHER): Payer: Medicare Other | Admitting: *Deleted

## 2021-12-11 DIAGNOSIS — I4891 Unspecified atrial fibrillation: Secondary | ICD-10-CM | POA: Diagnosis not present

## 2021-12-11 DIAGNOSIS — Z5181 Encounter for therapeutic drug level monitoring: Secondary | ICD-10-CM | POA: Diagnosis not present

## 2021-12-11 NOTE — Patient Instructions (Signed)
Continue warfarin 1 tablet daily except 1/2 tablet on Tuesdays and Fridays Recheck in 4 weeks Left for [place] on 12/28.  Standing INR orders given Message sent to pt/spouse via My Chart

## 2021-12-18 NOTE — Telephone Encounter (Signed)
Thx for update, please document allergy in epic  Zandra Abts MD

## 2021-12-22 ENCOUNTER — Encounter: Payer: Self-pay | Admitting: Cardiology

## 2021-12-23 MED ORDER — METOPROLOL SUCCINATE ER 25 MG PO TB24: ORAL_TABLET | ORAL | 3 refills | Status: AC

## 2021-12-27 ENCOUNTER — Telehealth: Payer: Self-pay | Admitting: Internal Medicine

## 2021-12-27 ENCOUNTER — Encounter: Payer: Self-pay | Admitting: Cardiology

## 2021-12-27 DIAGNOSIS — L299 Pruritus, unspecified: Secondary | ICD-10-CM | POA: Diagnosis not present

## 2021-12-27 DIAGNOSIS — R21 Rash and other nonspecific skin eruption: Secondary | ICD-10-CM | POA: Diagnosis not present

## 2021-12-27 NOTE — Telephone Encounter (Signed)
Spoke to husband Alicia Harrington his wife started having hives on arms, chset  First thought it was the [employer] back on Losartan   No change   Put her back on Entresto Held torsemide yesterday  Pt gets SOB with little activity    Has not done any walking at all Breathing wasn't good when arrived in [place] but worse now    Oversleeping    (taking benadryl) One arm is a total rash  Taking Toprol, Entresto, Potassium, warfarin, bentyl, Vit B12,Vit D, eye drops Also takes flurocal  Last dose of torsemide was Wed   Supposed to come home April 9  (via [place] )    Spoke to pharmacy   Cross reactivity between lasix and torsemide is not 100%  Taking   Now pt's husband says last week she had chattering of teeth and she was cold   he cut back on torsemide from 60 mg to 40 mg    Occasionally takes 20 if weight is good   Chattering went away.  Recomm she go to Urgent care  there   She has actually been off of Entresto for several days    She needs to get examined, get labs    Dont give meds until seen

## 2021-12-27 NOTE — Telephone Encounter (Signed)
See phone note from earlier today   Told pt to go to Urgent care there in [place]

## 2021-12-28 DIAGNOSIS — G629 Polyneuropathy, unspecified: Secondary | ICD-10-CM | POA: Diagnosis not present

## 2021-12-28 DIAGNOSIS — R918 Other nonspecific abnormal finding of lung field: Secondary | ICD-10-CM | POA: Diagnosis not present

## 2021-12-28 DIAGNOSIS — S0003XA Contusion of scalp, initial encounter: Secondary | ICD-10-CM | POA: Diagnosis not present

## 2021-12-28 DIAGNOSIS — S299XXA Unspecified injury of thorax, initial encounter: Secondary | ICD-10-CM | POA: Diagnosis not present

## 2021-12-28 DIAGNOSIS — Z9011 Acquired absence of right breast and nipple: Secondary | ICD-10-CM | POA: Diagnosis not present

## 2021-12-28 DIAGNOSIS — C9500 Acute leukemia of unspecified cell type not having achieved remission: Secondary | ICD-10-CM | POA: Diagnosis not present

## 2021-12-28 DIAGNOSIS — K219 Gastro-esophageal reflux disease without esophagitis: Secondary | ICD-10-CM | POA: Diagnosis not present

## 2021-12-28 DIAGNOSIS — Z7901 Long term (current) use of anticoagulants: Secondary | ICD-10-CM | POA: Diagnosis not present

## 2021-12-28 DIAGNOSIS — Z20822 Contact with and (suspected) exposure to covid-19: Secondary | ICD-10-CM | POA: Diagnosis present

## 2021-12-28 DIAGNOSIS — I4891 Unspecified atrial fibrillation: Secondary | ICD-10-CM | POA: Diagnosis not present

## 2021-12-28 DIAGNOSIS — N281 Cyst of kidney, acquired: Secondary | ICD-10-CM | POA: Diagnosis not present

## 2021-12-28 DIAGNOSIS — H409 Unspecified glaucoma: Secondary | ICD-10-CM | POA: Diagnosis not present

## 2021-12-28 DIAGNOSIS — Z66 Do not resuscitate: Secondary | ICD-10-CM | POA: Diagnosis not present

## 2021-12-28 DIAGNOSIS — H9190 Unspecified hearing loss, unspecified ear: Secondary | ICD-10-CM | POA: Diagnosis not present

## 2021-12-28 DIAGNOSIS — Z515 Encounter for palliative care: Secondary | ICD-10-CM | POA: Diagnosis not present

## 2021-12-28 DIAGNOSIS — S0990XA Unspecified injury of head, initial encounter: Secondary | ICD-10-CM | POA: Diagnosis not present

## 2021-12-28 DIAGNOSIS — N178 Other acute kidney failure: Secondary | ICD-10-CM | POA: Diagnosis not present

## 2021-12-28 DIAGNOSIS — N1330 Unspecified hydronephrosis: Secondary | ICD-10-CM | POA: Diagnosis not present

## 2021-12-28 DIAGNOSIS — R410 Disorientation, unspecified: Secondary | ICD-10-CM | POA: Diagnosis not present

## 2021-12-28 DIAGNOSIS — H3530 Unspecified macular degeneration: Secondary | ICD-10-CM | POA: Diagnosis not present

## 2021-12-28 DIAGNOSIS — Z9221 Personal history of antineoplastic chemotherapy: Secondary | ICD-10-CM | POA: Diagnosis not present

## 2021-12-28 DIAGNOSIS — R531 Weakness: Secondary | ICD-10-CM | POA: Diagnosis not present

## 2021-12-28 DIAGNOSIS — G620 Drug-induced polyneuropathy: Secondary | ICD-10-CM | POA: Diagnosis not present

## 2021-12-28 DIAGNOSIS — D8481 Immunodeficiency due to conditions classified elsewhere: Secondary | ICD-10-CM | POA: Diagnosis not present

## 2021-12-28 DIAGNOSIS — D61818 Other pancytopenia: Secondary | ICD-10-CM | POA: Diagnosis not present

## 2021-12-28 DIAGNOSIS — R040 Epistaxis: Secondary | ICD-10-CM | POA: Diagnosis not present

## 2021-12-28 DIAGNOSIS — D689 Coagulation defect, unspecified: Secondary | ICD-10-CM | POA: Diagnosis not present

## 2021-12-28 DIAGNOSIS — I130 Hypertensive heart and chronic kidney disease with heart failure and stage 1 through stage 4 chronic kidney disease, or unspecified chronic kidney disease: Secondary | ICD-10-CM | POA: Diagnosis not present

## 2021-12-28 DIAGNOSIS — R0989 Other specified symptoms and signs involving the circulatory and respiratory systems: Secondary | ICD-10-CM | POA: Diagnosis not present

## 2021-12-28 DIAGNOSIS — I350 Nonrheumatic aortic (valve) stenosis: Secondary | ICD-10-CM | POA: Diagnosis present

## 2021-12-28 DIAGNOSIS — S3991XA Unspecified injury of abdomen, initial encounter: Secondary | ICD-10-CM | POA: Diagnosis not present

## 2021-12-28 DIAGNOSIS — R4182 Altered mental status, unspecified: Secondary | ICD-10-CM | POA: Diagnosis not present

## 2021-12-28 DIAGNOSIS — I340 Nonrheumatic mitral (valve) insufficiency: Secondary | ICD-10-CM | POA: Diagnosis not present

## 2021-12-28 DIAGNOSIS — R791 Abnormal coagulation profile: Secondary | ICD-10-CM | POA: Diagnosis not present

## 2021-12-28 DIAGNOSIS — I482 Chronic atrial fibrillation, unspecified: Secondary | ICD-10-CM | POA: Diagnosis not present

## 2021-12-28 DIAGNOSIS — Z853 Personal history of malignant neoplasm of breast: Secondary | ICD-10-CM | POA: Diagnosis not present

## 2021-12-28 DIAGNOSIS — G9341 Metabolic encephalopathy: Secondary | ICD-10-CM | POA: Diagnosis not present

## 2021-12-28 DIAGNOSIS — I5022 Chronic systolic (congestive) heart failure: Secondary | ICD-10-CM | POA: Diagnosis not present

## 2021-12-28 DIAGNOSIS — I498 Other specified cardiac arrhythmias: Secondary | ICD-10-CM | POA: Diagnosis not present

## 2021-12-28 DIAGNOSIS — G319 Degenerative disease of nervous system, unspecified: Secondary | ICD-10-CM | POA: Diagnosis not present

## 2021-12-28 DIAGNOSIS — T45515A Adverse effect of anticoagulants, initial encounter: Secondary | ICD-10-CM | POA: Diagnosis not present

## 2021-12-28 DIAGNOSIS — S098XXA Other specified injuries of head, initial encounter: Secondary | ICD-10-CM | POA: Diagnosis not present

## 2021-12-28 DIAGNOSIS — I509 Heart failure, unspecified: Secondary | ICD-10-CM | POA: Diagnosis present

## 2021-12-28 DIAGNOSIS — R5383 Other fatigue: Secondary | ICD-10-CM | POA: Diagnosis not present

## 2021-12-28 DIAGNOSIS — S199XXA Unspecified injury of neck, initial encounter: Secondary | ICD-10-CM | POA: Diagnosis not present

## 2021-12-28 DIAGNOSIS — D6832 Hemorrhagic disorder due to extrinsic circulating anticoagulants: Secondary | ICD-10-CM | POA: Diagnosis not present

## 2021-12-28 DIAGNOSIS — S3993XA Unspecified injury of pelvis, initial encounter: Secondary | ICD-10-CM | POA: Diagnosis not present

## 2021-12-28 DIAGNOSIS — Z923 Personal history of irradiation: Secondary | ICD-10-CM | POA: Diagnosis not present

## 2021-12-28 DIAGNOSIS — R270 Ataxia, unspecified: Secondary | ICD-10-CM | POA: Diagnosis not present

## 2021-12-28 DIAGNOSIS — I700 Atherosclerosis of aorta: Secondary | ICD-10-CM | POA: Diagnosis not present

## 2021-12-28 DIAGNOSIS — Z6824 Body mass index (BMI) 24.0-24.9, adult: Secondary | ICD-10-CM | POA: Diagnosis not present

## 2021-12-28 DIAGNOSIS — R630 Anorexia: Secondary | ICD-10-CM | POA: Diagnosis not present

## 2021-12-28 DIAGNOSIS — N189 Chronic kidney disease, unspecified: Secondary | ICD-10-CM | POA: Diagnosis not present

## 2021-12-28 DIAGNOSIS — C9200 Acute myeloblastic leukemia, not having achieved remission: Secondary | ICD-10-CM | POA: Diagnosis not present

## 2021-12-28 DIAGNOSIS — E883 Tumor lysis syndrome: Secondary | ICD-10-CM | POA: Diagnosis not present

## 2021-12-28 DIAGNOSIS — N179 Acute kidney failure, unspecified: Secondary | ICD-10-CM | POA: Diagnosis not present

## 2021-12-31 DIAGNOSIS — D8481 Immunodeficiency due to conditions classified elsewhere: Secondary | ICD-10-CM | POA: Diagnosis not present

## 2021-12-31 DIAGNOSIS — Z515 Encounter for palliative care: Secondary | ICD-10-CM | POA: Diagnosis not present

## 2021-12-31 DIAGNOSIS — G620 Drug-induced polyneuropathy: Secondary | ICD-10-CM | POA: Diagnosis not present

## 2021-12-31 DIAGNOSIS — Z6822 Body mass index (BMI) 22.0-22.9, adult: Secondary | ICD-10-CM | POA: Diagnosis not present

## 2021-12-31 DIAGNOSIS — C9500 Acute leukemia of unspecified cell type not having achieved remission: Secondary | ICD-10-CM | POA: Diagnosis not present

## 2021-12-31 DIAGNOSIS — R531 Weakness: Secondary | ICD-10-CM | POA: Diagnosis not present

## 2021-12-31 DIAGNOSIS — N189 Chronic kidney disease, unspecified: Secondary | ICD-10-CM | POA: Diagnosis not present

## 2021-12-31 DIAGNOSIS — R630 Anorexia: Secondary | ICD-10-CM | POA: Diagnosis not present

## 2021-12-31 DIAGNOSIS — H9190 Unspecified hearing loss, unspecified ear: Secondary | ICD-10-CM | POA: Diagnosis not present

## 2021-12-31 DIAGNOSIS — Z741 Need for assistance with personal care: Secondary | ICD-10-CM | POA: Diagnosis not present

## 2021-12-31 DIAGNOSIS — N178 Other acute kidney failure: Secondary | ICD-10-CM | POA: Diagnosis not present

## 2021-12-31 DIAGNOSIS — Z7401 Bed confinement status: Secondary | ICD-10-CM | POA: Diagnosis not present

## 2021-12-31 DIAGNOSIS — Z960 Presence of urogenital implants: Secondary | ICD-10-CM | POA: Diagnosis not present

## 2021-12-31 DIAGNOSIS — D6832 Hemorrhagic disorder due to extrinsic circulating anticoagulants: Secondary | ICD-10-CM | POA: Diagnosis not present

## 2021-12-31 DIAGNOSIS — R270 Ataxia, unspecified: Secondary | ICD-10-CM | POA: Diagnosis not present

## 2021-12-31 DIAGNOSIS — I482 Chronic atrial fibrillation, unspecified: Secondary | ICD-10-CM | POA: Diagnosis not present

## 2021-12-31 DIAGNOSIS — R040 Epistaxis: Secondary | ICD-10-CM | POA: Diagnosis not present

## 2021-12-31 DIAGNOSIS — I5022 Chronic systolic (congestive) heart failure: Secondary | ICD-10-CM | POA: Diagnosis not present

## 2021-12-31 DIAGNOSIS — I340 Nonrheumatic mitral (valve) insufficiency: Secondary | ICD-10-CM | POA: Diagnosis not present

## 2021-12-31 DIAGNOSIS — Z66 Do not resuscitate: Secondary | ICD-10-CM | POA: Diagnosis not present

## 2021-12-31 DIAGNOSIS — S0003XA Contusion of scalp, initial encounter: Secondary | ICD-10-CM | POA: Diagnosis not present

## 2021-12-31 DIAGNOSIS — D61818 Other pancytopenia: Secondary | ICD-10-CM | POA: Diagnosis not present

## 2021-12-31 DIAGNOSIS — H3530 Unspecified macular degeneration: Secondary | ICD-10-CM | POA: Diagnosis not present

## 2021-12-31 DIAGNOSIS — H409 Unspecified glaucoma: Secondary | ICD-10-CM | POA: Diagnosis not present

## 2021-12-31 DIAGNOSIS — G9341 Metabolic encephalopathy: Secondary | ICD-10-CM | POA: Diagnosis not present

## 2021-12-31 DIAGNOSIS — Z9981 Dependence on supplemental oxygen: Secondary | ICD-10-CM | POA: Diagnosis not present

## 2021-12-31 DIAGNOSIS — I509 Heart failure, unspecified: Secondary | ICD-10-CM | POA: Diagnosis not present

## 2021-12-31 DIAGNOSIS — G609 Hereditary and idiopathic neuropathy, unspecified: Secondary | ICD-10-CM | POA: Diagnosis not present

## 2021-12-31 DIAGNOSIS — Z9181 History of falling: Secondary | ICD-10-CM | POA: Diagnosis not present

## 2021-12-31 DIAGNOSIS — G319 Degenerative disease of nervous system, unspecified: Secondary | ICD-10-CM | POA: Diagnosis not present

## 2021-12-31 DIAGNOSIS — Z853 Personal history of malignant neoplasm of breast: Secondary | ICD-10-CM | POA: Diagnosis not present

## 2021-12-31 DIAGNOSIS — C9200 Acute myeloblastic leukemia, not having achieved remission: Secondary | ICD-10-CM | POA: Diagnosis not present

## 2021-12-31 DIAGNOSIS — Z6824 Body mass index (BMI) 24.0-24.9, adult: Secondary | ICD-10-CM | POA: Diagnosis not present

## 2021-12-31 DIAGNOSIS — R5383 Other fatigue: Secondary | ICD-10-CM | POA: Diagnosis not present

## 2022-01-01 NOTE — Telephone Encounter (Signed)
Can we check with patient and see if she was seen in urgent care and if so if there were any recommendations about her hives?  Zandra Abts MD

## 2022-01-03 DIAGNOSIS — D61818 Other pancytopenia: Secondary | ICD-10-CM | POA: Diagnosis not present

## 2022-01-03 DIAGNOSIS — C9200 Acute myeloblastic leukemia, not having achieved remission: Secondary | ICD-10-CM | POA: Diagnosis not present

## 2022-01-22 DEATH — deceased

## 2022-02-10 ENCOUNTER — Ambulatory Visit: Payer: Self-pay | Admitting: *Deleted

## 2022-02-25 ENCOUNTER — Encounter: Payer: Self-pay | Admitting: Cardiology

## 2022-03-04 ENCOUNTER — Ambulatory Visit (INDEPENDENT_AMBULATORY_CARE_PROVIDER_SITE_OTHER): Payer: Medicare Other | Admitting: Internal Medicine

## 2022-03-14 ENCOUNTER — Ambulatory Visit: Payer: Medicare Other | Admitting: Cardiology
